# Patient Record
Sex: Female | Born: 1935
Health system: Southern US, Community
[De-identification: ages and names within clinical notes are randomized; demographics above are authoritative.]

## PROBLEM LIST (undated history)

## (undated) DIAGNOSIS — I10 Essential (primary) hypertension: Secondary | ICD-10-CM

## (undated) DIAGNOSIS — I219 Acute myocardial infarction, unspecified: Secondary | ICD-10-CM

## (undated) DIAGNOSIS — H269 Unspecified cataract: Secondary | ICD-10-CM

## (undated) DIAGNOSIS — I2510 Atherosclerotic heart disease of native coronary artery without angina pectoris: Secondary | ICD-10-CM

## (undated) DIAGNOSIS — K219 Gastro-esophageal reflux disease without esophagitis: Secondary | ICD-10-CM

## (undated) DIAGNOSIS — G709 Myoneural disorder, unspecified: Secondary | ICD-10-CM

## (undated) DIAGNOSIS — T7840XA Allergy, unspecified, initial encounter: Secondary | ICD-10-CM

## (undated) DIAGNOSIS — E039 Hypothyroidism, unspecified: Secondary | ICD-10-CM

## (undated) DIAGNOSIS — E785 Hyperlipidemia, unspecified: Secondary | ICD-10-CM

## (undated) DIAGNOSIS — M159 Polyosteoarthritis, unspecified: Secondary | ICD-10-CM

## (undated) HISTORY — DX: Gastro-esophageal reflux disease without esophagitis: K21.9

## (undated) HISTORY — DX: Polyosteoarthritis, unspecified: M15.9

## (undated) HISTORY — DX: Myoneural disorder, unspecified: G70.9

## (undated) HISTORY — PX: ABDOMINAL HYSTERECTOMY: SHX81

## (undated) HISTORY — DX: Hyperlipidemia, unspecified: E78.5

## (undated) HISTORY — DX: Essential (primary) hypertension: I10

## (undated) HISTORY — PX: TUBAL LIGATION: SHX77

## (undated) HISTORY — PX: APPENDECTOMY: SHX54

## (undated) HISTORY — PX: BLADDER REPAIR: SHX76

## (undated) HISTORY — DX: Hypothyroidism, unspecified: E03.9

## (undated) HISTORY — PX: SPINE SURGERY: SHX786

## (undated) HISTORY — DX: Unspecified cataract: H26.9

## (undated) HISTORY — DX: Allergy, unspecified, initial encounter: T78.40XA

## (undated) SURGERY — Surgical Case
Anesthesia: *Unknown

## (undated) SURGICAL SUPPLY — 20 items
BALLN EMERGE MR 2.5X8 (BALLOONS) ×1 IMPLANT
BALLN WOLVERINE 2.50X6 (BALLOONS) ×1 IMPLANT
BALLN ~~LOC~~ EMERGE MR 2.5X6 (BALLOONS) ×1 IMPLANT
BALLOON EMERGE MR 2.5X8 (BALLOONS) IMPLANT
BALLOON WOLVERINE 2.50X6 (BALLOONS) IMPLANT
BALLOON ~~LOC~~ EMERGE MR 2.5X6 (BALLOONS) IMPLANT
CATH DRAGONFLY OPSTAR (CATHETERS) IMPLANT
CATH INFINITI AMBI 5FR TG (CATHETERS) IMPLANT
CATH VISTA GUIDE 6FR XB3.5 EPK (CATHETERS) IMPLANT
DEVICE RAD COMP TR BAND LRG (VASCULAR PRODUCTS) IMPLANT
GLIDESHEATH SLEND A-KIT 6F 22G (SHEATH) IMPLANT
GUIDEWIRE INQWIRE 1.5J.035X260 (WIRE) IMPLANT
INQWIRE 1.5J .035X260CM (WIRE) ×1 IMPLANT
KIT ENCORE 26 ADVANTAGE (KITS) IMPLANT
KIT HEMO VALVE WATCHDOG (MISCELLANEOUS) IMPLANT
PACK CARDIAC CATHETERIZATION (CUSTOM PROCEDURE TRAY) ×1 IMPLANT
SET ATX-X65L (MISCELLANEOUS) IMPLANT
STENT ONYX FRONTIER 2.5X08 (Permanent Stent) IMPLANT
WIRE ASAHI PROWATER 180CM (WIRE) IMPLANT
WIRE RUNTHROUGH .014X180CM (WIRE) IMPLANT

---

## 1997-01-31 HISTORY — PX: BACK SURGERY: SHX140

## 1999-11-03 ENCOUNTER — Other Ambulatory Visit: Admission: RE | Admit: 1999-11-03 | Discharge: 1999-11-03 | Payer: Self-pay | Admitting: Internal Medicine

## 2001-08-21 ENCOUNTER — Ambulatory Visit (HOSPITAL_COMMUNITY): Admission: RE | Admit: 2001-08-21 | Discharge: 2001-08-21 | Payer: Self-pay | Admitting: Gastroenterology

## 2003-03-05 ENCOUNTER — Encounter: Admission: RE | Admit: 2003-03-05 | Discharge: 2003-03-05 | Payer: Self-pay | Admitting: Neurosurgery

## 2004-01-20 ENCOUNTER — Ambulatory Visit: Payer: Self-pay | Admitting: Internal Medicine

## 2004-03-16 ENCOUNTER — Ambulatory Visit: Payer: Self-pay | Admitting: Internal Medicine

## 2004-04-21 ENCOUNTER — Ambulatory Visit: Payer: Self-pay | Admitting: Internal Medicine

## 2004-07-22 ENCOUNTER — Ambulatory Visit: Payer: Self-pay | Admitting: Internal Medicine

## 2004-07-22 ENCOUNTER — Other Ambulatory Visit: Admission: RE | Admit: 2004-07-22 | Discharge: 2004-07-22 | Payer: Self-pay | Admitting: Internal Medicine

## 2004-11-22 ENCOUNTER — Ambulatory Visit: Payer: Self-pay | Admitting: Internal Medicine

## 2005-01-12 ENCOUNTER — Inpatient Hospital Stay (HOSPITAL_COMMUNITY): Admission: RE | Admit: 2005-01-12 | Discharge: 2005-01-13 | Payer: Self-pay | Admitting: Obstetrics and Gynecology

## 2005-01-12 ENCOUNTER — Encounter (INDEPENDENT_AMBULATORY_CARE_PROVIDER_SITE_OTHER): Payer: Self-pay | Admitting: Specialist

## 2005-03-21 ENCOUNTER — Ambulatory Visit: Payer: Self-pay | Admitting: Internal Medicine

## 2005-03-24 ENCOUNTER — Ambulatory Visit: Payer: Self-pay

## 2005-05-19 ENCOUNTER — Ambulatory Visit: Payer: Self-pay | Admitting: Internal Medicine

## 2005-05-26 ENCOUNTER — Ambulatory Visit: Payer: Self-pay | Admitting: Internal Medicine

## 2005-07-01 ENCOUNTER — Ambulatory Visit: Payer: Self-pay | Admitting: Internal Medicine

## 2005-08-30 ENCOUNTER — Ambulatory Visit: Payer: Self-pay | Admitting: Internal Medicine

## 2005-09-06 ENCOUNTER — Ambulatory Visit: Payer: Self-pay | Admitting: Internal Medicine

## 2005-12-06 ENCOUNTER — Ambulatory Visit: Payer: Self-pay | Admitting: Internal Medicine

## 2005-12-06 LAB — CONVERTED CEMR LAB
ALT: 13 units/L (ref 0–40)
AST: 22 units/L (ref 0–37)
Albumin: 3.5 g/dL (ref 3.5–5.2)
Alkaline Phosphatase: 84 units/L (ref 39–117)
VLDL: 21 mg/dL (ref 0–40)

## 2006-02-23 ENCOUNTER — Ambulatory Visit: Payer: Self-pay | Admitting: Internal Medicine

## 2006-03-07 ENCOUNTER — Ambulatory Visit: Payer: Self-pay | Admitting: Internal Medicine

## 2006-03-29 ENCOUNTER — Ambulatory Visit: Payer: Self-pay | Admitting: Internal Medicine

## 2006-05-08 ENCOUNTER — Ambulatory Visit: Payer: Self-pay | Admitting: Internal Medicine

## 2006-05-15 ENCOUNTER — Ambulatory Visit: Payer: Self-pay | Admitting: Internal Medicine

## 2006-07-03 ENCOUNTER — Ambulatory Visit: Payer: Self-pay | Admitting: Internal Medicine

## 2006-07-03 LAB — CONVERTED CEMR LAB
ALT: 18 units/L (ref 0–40)
AST: 23 units/L (ref 0–37)
Alkaline Phosphatase: 72 units/L (ref 39–117)
BUN: 18 mg/dL (ref 6–23)
Basophils Relative: 0.8 % (ref 0.0–1.0)
Bilirubin, Direct: 0.1 mg/dL (ref 0.0–0.3)
CO2: 32 meq/L (ref 19–32)
Calcium: 9.4 mg/dL (ref 8.4–10.5)
Chloride: 107 meq/L (ref 96–112)
Eosinophils Absolute: 0.2 10*3/uL (ref 0.0–0.6)
Eosinophils Relative: 2.2 % (ref 0.0–5.0)
GFR calc non Af Amer: 75 mL/min
Glucose, Bld: 102 mg/dL — ABNORMAL HIGH (ref 70–99)
Monocytes Relative: 8.2 % (ref 3.0–11.0)
Platelets: 218 10*3/uL (ref 150–400)
RBC: 4.3 M/uL (ref 3.87–5.11)
Total CHOL/HDL Ratio: 4
Triglycerides: 146 mg/dL (ref 0–149)
VLDL: 29 mg/dL (ref 0–40)
WBC: 7.4 10*3/uL (ref 4.5–10.5)

## 2006-08-24 DIAGNOSIS — K219 Gastro-esophageal reflux disease without esophagitis: Secondary | ICD-10-CM | POA: Insufficient documentation

## 2006-08-24 DIAGNOSIS — I10 Essential (primary) hypertension: Secondary | ICD-10-CM | POA: Insufficient documentation

## 2006-08-24 DIAGNOSIS — E039 Hypothyroidism, unspecified: Secondary | ICD-10-CM | POA: Insufficient documentation

## 2006-08-24 DIAGNOSIS — M810 Age-related osteoporosis without current pathological fracture: Secondary | ICD-10-CM | POA: Insufficient documentation

## 2006-09-06 ENCOUNTER — Ambulatory Visit: Payer: Self-pay | Admitting: Internal Medicine

## 2006-09-06 DIAGNOSIS — E7800 Pure hypercholesterolemia, unspecified: Secondary | ICD-10-CM | POA: Insufficient documentation

## 2006-09-06 DIAGNOSIS — E785 Hyperlipidemia, unspecified: Secondary | ICD-10-CM | POA: Insufficient documentation

## 2006-12-17 ENCOUNTER — Emergency Department (HOSPITAL_COMMUNITY): Admission: EM | Admit: 2006-12-17 | Discharge: 2006-12-17 | Payer: Self-pay | Admitting: Emergency Medicine

## 2006-12-19 ENCOUNTER — Telehealth: Payer: Self-pay | Admitting: Internal Medicine

## 2006-12-22 ENCOUNTER — Telehealth (INDEPENDENT_AMBULATORY_CARE_PROVIDER_SITE_OTHER): Payer: Self-pay | Admitting: *Deleted

## 2006-12-22 ENCOUNTER — Ambulatory Visit: Payer: Self-pay | Admitting: Internal Medicine

## 2007-03-09 ENCOUNTER — Ambulatory Visit: Payer: Self-pay | Admitting: Internal Medicine

## 2007-03-09 LAB — CONVERTED CEMR LAB
Albumin: 3.6 g/dL (ref 3.5–5.2)
Alkaline Phosphatase: 79 units/L (ref 39–117)
Direct LDL: 169.5 mg/dL
HDL: 38.6 mg/dL — ABNORMAL LOW (ref >39.0)
TSH: 0.56 microintl units/mL (ref 0.35–5.50)
Total Bilirubin: 0.8 mg/dL (ref 0.3–1.2)
Triglycerides: 330 mg/dL (ref 0–149)
VLDL: 66 mg/dL — ABNORMAL HIGH (ref 0–40)

## 2007-03-15 ENCOUNTER — Ambulatory Visit: Payer: Self-pay | Admitting: Internal Medicine

## 2007-03-15 LAB — CONVERTED CEMR LAB
Cholesterol, target level: 200 mg/dL
HDL goal, serum: 40 mg/dL
LDL Goal: 130 mg/dL

## 2007-06-01 ENCOUNTER — Ambulatory Visit: Payer: Self-pay | Admitting: Internal Medicine

## 2007-07-20 ENCOUNTER — Ambulatory Visit: Payer: Self-pay | Admitting: Internal Medicine

## 2007-07-20 LAB — CONVERTED CEMR LAB
ALT: 19 units/L (ref 0–35)
AST: 24 units/L (ref 0–37)
Alkaline Phosphatase: 70 units/L (ref 39–117)
Bilirubin, Direct: 0.2 mg/dL (ref 0.0–0.3)
Cholesterol: 182 mg/dL (ref 0–200)
Total Bilirubin: 0.9 mg/dL (ref 0.3–1.2)
Total Protein: 6.7 g/dL (ref 6.0–8.3)

## 2007-08-01 ENCOUNTER — Telehealth: Payer: Self-pay | Admitting: Internal Medicine

## 2007-08-01 ENCOUNTER — Ambulatory Visit: Payer: Self-pay | Admitting: Internal Medicine

## 2007-08-01 DIAGNOSIS — I8000 Phlebitis and thrombophlebitis of superficial vessels of unspecified lower extremity: Secondary | ICD-10-CM | POA: Insufficient documentation

## 2007-08-06 ENCOUNTER — Telehealth: Payer: Self-pay | Admitting: Internal Medicine

## 2007-08-15 ENCOUNTER — Telehealth: Payer: Self-pay | Admitting: Internal Medicine

## 2007-08-29 ENCOUNTER — Ambulatory Visit: Payer: Self-pay | Admitting: Family Medicine

## 2007-08-29 DIAGNOSIS — J209 Acute bronchitis, unspecified: Secondary | ICD-10-CM | POA: Insufficient documentation

## 2007-11-01 ENCOUNTER — Ambulatory Visit: Payer: Self-pay | Admitting: Internal Medicine

## 2007-11-01 DIAGNOSIS — J302 Other seasonal allergic rhinitis: Secondary | ICD-10-CM | POA: Insufficient documentation

## 2007-11-20 ENCOUNTER — Encounter: Admission: RE | Admit: 2007-11-20 | Discharge: 2007-11-20 | Payer: Self-pay | Admitting: Internal Medicine

## 2007-11-20 ENCOUNTER — Encounter: Payer: Self-pay | Admitting: Internal Medicine

## 2008-03-12 ENCOUNTER — Ambulatory Visit: Payer: Self-pay | Admitting: Internal Medicine

## 2008-03-12 LAB — CONVERTED CEMR LAB
ALT: 20 units/L (ref 0–35)
Albumin: 3.8 g/dL (ref 3.5–5.2)
Alkaline Phosphatase: 81 units/L (ref 39–117)
BUN: 14 mg/dL (ref 6–23)
Bilirubin, Direct: 0.1 mg/dL (ref 0.0–0.3)
CO2: 29 meq/L (ref 19–32)
Calcium: 9.4 mg/dL (ref 8.4–10.5)
Cholesterol: 203 mg/dL (ref 0–200)
Eosinophils Relative: 2.5 % (ref 0.0–5.0)
Glucose, Bld: 104 mg/dL — ABNORMAL HIGH (ref 70–99)
HCT: 39.1 % (ref 36.0–46.0)
Hemoglobin: 13.6 g/dL (ref 12.0–15.0)
Lymphocytes Relative: 24.8 % (ref 12.0–46.0)
Monocytes Absolute: 0.6 10*3/uL (ref 0.1–1.0)
Monocytes Relative: 8.8 % (ref 3.0–12.0)
Neutro Abs: 4 10*3/uL (ref 1.4–7.7)
Potassium: 3.4 meq/L — ABNORMAL LOW (ref 3.5–5.1)
RDW: 11.8 % (ref 11.5–14.6)
Sodium: 141 meq/L (ref 135–145)
Total CHOL/HDL Ratio: 4.1
Total Protein: 7.3 g/dL (ref 6.0–8.3)
VLDL: 25 mg/dL (ref 0–40)
WBC: 6.5 10*3/uL (ref 4.5–10.5)

## 2008-06-05 ENCOUNTER — Emergency Department (HOSPITAL_COMMUNITY): Admission: EM | Admit: 2008-06-05 | Discharge: 2008-06-05 | Payer: Self-pay | Admitting: Emergency Medicine

## 2008-06-05 ENCOUNTER — Telehealth (INDEPENDENT_AMBULATORY_CARE_PROVIDER_SITE_OTHER): Payer: Self-pay | Admitting: *Deleted

## 2008-07-30 ENCOUNTER — Ambulatory Visit: Payer: Self-pay | Admitting: Internal Medicine

## 2008-07-30 DIAGNOSIS — C44599 Other specified malignant neoplasm of skin of other part of trunk: Secondary | ICD-10-CM | POA: Insufficient documentation

## 2008-07-30 DIAGNOSIS — J019 Acute sinusitis, unspecified: Secondary | ICD-10-CM | POA: Insufficient documentation

## 2008-07-30 DIAGNOSIS — J0111 Acute recurrent frontal sinusitis: Secondary | ICD-10-CM | POA: Insufficient documentation

## 2008-07-30 DIAGNOSIS — J0110 Acute frontal sinusitis, unspecified: Secondary | ICD-10-CM | POA: Insufficient documentation

## 2008-08-27 ENCOUNTER — Ambulatory Visit: Payer: Self-pay | Admitting: Internal Medicine

## 2008-08-27 ENCOUNTER — Telehealth: Payer: Self-pay | Admitting: Internal Medicine

## 2008-08-27 DIAGNOSIS — R05 Cough: Secondary | ICD-10-CM

## 2008-08-27 DIAGNOSIS — R059 Cough, unspecified: Secondary | ICD-10-CM | POA: Insufficient documentation

## 2008-08-27 DIAGNOSIS — R053 Chronic cough: Secondary | ICD-10-CM | POA: Insufficient documentation

## 2008-09-12 ENCOUNTER — Encounter: Payer: Self-pay | Admitting: Internal Medicine

## 2008-09-12 ENCOUNTER — Ambulatory Visit: Payer: Self-pay

## 2008-09-22 ENCOUNTER — Ambulatory Visit: Payer: Self-pay | Admitting: Internal Medicine

## 2008-09-22 DIAGNOSIS — B079 Viral wart, unspecified: Secondary | ICD-10-CM | POA: Insufficient documentation

## 2008-09-22 DIAGNOSIS — D1739 Benign lipomatous neoplasm of skin and subcutaneous tissue of other sites: Secondary | ICD-10-CM | POA: Insufficient documentation

## 2008-09-22 DIAGNOSIS — D239 Other benign neoplasm of skin, unspecified: Secondary | ICD-10-CM | POA: Insufficient documentation

## 2008-09-22 DIAGNOSIS — L989 Disorder of the skin and subcutaneous tissue, unspecified: Secondary | ICD-10-CM | POA: Insufficient documentation

## 2008-09-22 DIAGNOSIS — R233 Spontaneous ecchymoses: Secondary | ICD-10-CM | POA: Insufficient documentation

## 2009-04-21 ENCOUNTER — Telehealth: Payer: Self-pay | Admitting: Internal Medicine

## 2009-05-29 ENCOUNTER — Ambulatory Visit: Payer: Self-pay | Admitting: Internal Medicine

## 2009-05-29 LAB — CONVERTED CEMR LAB
Ketones, urine, test strip: NEGATIVE
Nitrite: NEGATIVE
Urobilinogen, UA: 0.2
WBC Urine, dipstick: NEGATIVE

## 2009-06-01 LAB — CONVERTED CEMR LAB
AST: 23 units/L (ref 0–37)
Albumin: 3.7 g/dL (ref 3.5–5.2)
Alkaline Phosphatase: 73 units/L (ref 39–117)
Basophils Absolute: 0 10*3/uL (ref 0.0–0.1)
Calcium: 9.4 mg/dL (ref 8.4–10.5)
Eosinophils Relative: 2.9 % (ref 0.0–5.0)
GFR calc non Af Amer: 74.66 mL/min (ref >60)
Glucose, Bld: 95 mg/dL (ref 70–99)
HDL: 45.3 mg/dL (ref >39.00)
Hemoglobin: 13.4 g/dL (ref 12.0–15.0)
Lymphocytes Relative: 30.2 % (ref 12.0–46.0)
Monocytes Relative: 10.5 % (ref 3.0–12.0)
Neutro Abs: 2.8 10*3/uL (ref 1.4–7.7)
RBC: 4.22 M/uL (ref 3.87–5.11)
RDW: 13.8 % (ref 11.5–14.6)
Sodium: 143 meq/L (ref 135–145)
Total CHOL/HDL Ratio: 6
Triglycerides: 287 mg/dL — ABNORMAL HIGH (ref 0.0–149.0)
VLDL: 57.4 mg/dL — ABNORMAL HIGH (ref 0.0–40.0)
WBC: 4.9 10*3/uL (ref 4.5–10.5)

## 2009-06-05 ENCOUNTER — Ambulatory Visit: Payer: Self-pay | Admitting: Internal Medicine

## 2009-06-05 DIAGNOSIS — R7303 Prediabetes: Secondary | ICD-10-CM | POA: Insufficient documentation

## 2009-12-04 ENCOUNTER — Ambulatory Visit: Payer: Self-pay | Admitting: Internal Medicine

## 2009-12-04 LAB — CONVERTED CEMR LAB
Calcium: 9.8 mg/dL (ref 8.4–10.5)
Chloride: 104 meq/L (ref 96–112)
Cholesterol: 219 mg/dL — ABNORMAL HIGH (ref 0–200)
Creatinine, Ser: 0.9 mg/dL (ref 0.4–1.2)
Direct LDL: 148 mg/dL
Total CHOL/HDL Ratio: 4
Triglycerides: 211 mg/dL — ABNORMAL HIGH (ref 0.0–149.0)
VLDL: 42.2 mg/dL — ABNORMAL HIGH (ref 0.0–40.0)
Vit D, 25-Hydroxy: 59 ng/mL (ref 30–89)

## 2010-02-05 ENCOUNTER — Ambulatory Visit
Admission: RE | Admit: 2010-02-05 | Discharge: 2010-02-05 | Payer: Self-pay | Source: Home / Self Care | Attending: Internal Medicine | Admitting: Internal Medicine

## 2010-02-05 DIAGNOSIS — R0989 Other specified symptoms and signs involving the circulatory and respiratory systems: Secondary | ICD-10-CM | POA: Insufficient documentation

## 2010-02-05 DIAGNOSIS — M5412 Radiculopathy, cervical region: Secondary | ICD-10-CM | POA: Insufficient documentation

## 2010-02-11 ENCOUNTER — Encounter: Payer: Self-pay | Admitting: Internal Medicine

## 2010-02-12 ENCOUNTER — Ambulatory Visit: Admission: RE | Admit: 2010-02-12 | Discharge: 2010-02-12 | Payer: Self-pay | Source: Home / Self Care

## 2010-02-12 ENCOUNTER — Encounter: Payer: Self-pay | Admitting: Internal Medicine

## 2010-03-02 ENCOUNTER — Encounter: Admission: RE | Admit: 2010-03-02 | Payer: Self-pay | Source: Home / Self Care | Admitting: Internal Medicine

## 2010-03-04 NOTE — Assessment & Plan Note (Signed)
Summary: CPX  // RS   Vital Signs:  Patient profile:   77 year old female Height:      67 inches Weight:      168 pounds BMI:     26.41 Temp:     98.2 degrees F oral Pulse rate:   68 / minute Resp:     14 per minute BP sitting:   140 / 80  (left arm)  Vitals Entered By: Willy Eddy, LPN (Jun 05, 6293 11:00 AM) CC: annual visit for disease management-- not taking vytorin, Hypertension Management, Lipid Management   CC:  annual visit for disease management-- not taking vytorin, Hypertension Management, and Lipid Management.  History of Present Illness: The pt was asked about all immunizations, health maint. services that are appropriate to their age and was given guidance on diet exercize  and weight management  lipids: not tsaking meds and this is apaent from the lipid panel  the pt had her mamaogram and pelvic immunizations and referral reviewed and she will need to get flu shot   Hypertension History:      She denies headache, chest pain, palpitations, dyspnea with exertion, orthopnea, PND, peripheral edema, visual symptoms, neurologic problems, syncope, and side effects from treatment.        Positive major cardiovascular risk factors include female age 16 years old or older, hyperlipidemia, and hypertension.  Negative major cardiovascular risk factors include negative family history for ischemic heart disease and non-tobacco-user status.    Lipid Management History:      Positive NCEP/ATP III risk factors include female age 55 years old or older and hypertension.  Negative NCEP/ATP III risk factors include no family history for ischemic heart disease and non-tobacco-user status.      Preventive Screening-Counseling & Management  Alcohol-Tobacco     Smoking Status: never     Passive Smoke Exposure: no  Current Problems (verified): 1)  Benign Neoplasm of Skin Site Unspecified  (ICD-216.9) 2)  Wart, Viral  (ICD-078.10) 3)  Lipoma, Skin  (ICD-214.1) 4)   Cardiomegaly  (ICD-429.3) 5)  Cough  (ICD-786.2) 6)  Neoplasm, Malignant, Skin, Trunk  (ICD-173.5) 7)  Acute Sinusitis, Unspecified  (ICD-461.9) 8)  Preventive Health Care  (ICD-V70.0) 9)  Allergic Rhinitis  (ICD-477.9) 10)  Acute Bronchitis  (ICD-466.0) 11)  Phlebitis&thrombophleb Sup Vessels Lower Extrem  (ICD-451.0) 12)  Hyperlipidemia Nec/nos  (ICD-272.4) 13)  Hypothyroidism  (ICD-244.9) 14)  Hypertension  (ICD-401.9) 15)  Gerd  (ICD-530.81) 16)  Family History Diabetes 1st Degree Relative  (ICD-V18.0) 17)  Osteoporosis  (ICD-733.00)  Medications Prior to Update: 1)  Folic Acid 1 Mg  Tabs (Folic Acid) .... Once Daily 2)  Oscal 500/200 D-3 500-200 Mg-Unit  Tabs (Calcium-Vitamin D) .... Once Daily 3)  Synthroid 125 Mcg  Tabs (Levothyroxine Sodium) .... Once Daily 4)  Bl Vitamin E Natural 400 Unit  Caps (Vitamin E) .... Once Daily 5)  Maxzide-25 37.5-25 Mg  Tabs (Triamterene-Hctz) .... Once Daily 6)  Vytorin 10-40 Mg  Tabs (Ezetimibe-Simvastatin) .... Once Daily 7)  Bayer Low Strength 81 Mg  Tbec (Aspirin) .... Once Daily 8)  Nexium 40 Mg  Cpdr (Esomeprazole Magnesium) .... One By Mouth Daily 9)  D 1000 Plus  Tabs (Fa-Cyanocobalamin-B6-D-Ca) .... 2 Once Daily 10)  Zyrtec Allergy 10 Mg Tabs (Cetirizine Hcl) .Marland Kitchen.. 1 Once Daily 11)  Advil Cold/sinus 30-200 Mg Tabs (Pseudoephedrine-Ibuprofen) .... As Needed 12)  Biaxin 500 Mg Tabs (Clarithromycin) .Marland Kitchen.. 1 Two Times A Day For 10 Days  13)  Hydromet 5-1.5 Mg/74ml Syrp (Hydrocodone-Homatropine) .Marland Kitchen.. 1 Tsp Every 6 Hours Prn 14)  Keflex 500 Mg Caps (Cephalexin) .... One By Mouth Tid  Current Medications (verified): 1)  Folic Acid 1 Mg  Tabs (Folic Acid) .... Once Daily 2)  Oscal 500/200 D-3 500-200 Mg-Unit  Tabs (Calcium-Vitamin D) .... Once Daily 3)  Synthroid 125 Mcg  Tabs (Levothyroxine Sodium) .... Once Daily 4)  Bl Vitamin E Natural 400 Unit  Caps (Vitamin E) .... Once Daily 5)  Maxzide-25 37.5-25 Mg  Tabs (Triamterene-Hctz) .... Once  Daily 6)  Vytorin 10-40 Mg  Tabs (Ezetimibe-Simvastatin) .... Once Daily 7)  Bayer Low Strength 81 Mg  Tbec (Aspirin) .... Once Daily 8)  Nexium 40 Mg  Cpdr (Esomeprazole Magnesium) .... One By Mouth Daily 9)  D 1000 Plus  Tabs (Fa-Cyanocobalamin-B6-D-Ca) .... 2 Once Daily 10)  Zyrtec Allergy 10 Mg Tabs (Cetirizine Hcl) .Marland Kitchen.. 1 Once Daily 11)  Advil Cold/sinus 30-200 Mg Tabs (Pseudoephedrine-Ibuprofen) .... As Needed  Allergies: No Known Drug Allergies  Past History:  Family History: Last updated: 08/24/2006 Family History Diabetes 1st degree relative Family History Hypertension Family History of Stroke M 1st degree relative <50 Fam hc CHF Fam hx MI  Social History: Last updated: 08/24/2006 Never Smoked Alcohol use-yes  Risk Factors: Smoking Status: never (06/05/2009) Passive Smoke Exposure: no (06/05/2009)  Past medical, surgical, family and social histories (including risk factors) reviewed, and no changes noted (except as noted below).  Past Medical History: Reviewed history from 11/01/2007 and no changes required. Osteoporosis Lipids GERD Hypertension Hypothyroidism Allergic rhinitis  Past Surgical History: Reviewed history from 08/24/2006 and no changes required. TAH-25 yrs  Family History: Reviewed history from 08/24/2006 and no changes required. Family History Diabetes 1st degree relative Family History Hypertension Family History of Stroke M 1st degree relative <50 Fam hc CHF Fam hx MI  Social History: Reviewed history from 08/24/2006 and no changes required. Never Smoked Alcohol use-yes  Review of Systems  The patient denies anorexia, fever, weight loss, weight gain, vision loss, decreased hearing, hoarseness, chest pain, syncope, dyspnea on exertion, peripheral edema, prolonged cough, headaches, hemoptysis, abdominal pain, melena, hematochezia, severe indigestion/heartburn, hematuria, incontinence, genital sores, muscle weakness, suspicious skin  lesions, transient blindness, difficulty walking, depression, unusual weight change, abnormal bleeding, enlarged lymph nodes, angioedema, and breast masses.    Physical Exam  General:  Coughing frequently Head:  Normocephalic and atraumatic without obvious abnormalities. No apparent alopecia or balding. Eyes:  No corneal or conjunctival inflammation noted. EOMI. Perrla. Funduscopic exam benign, without hemorrhages, exudates or papilledema. Vision grossly normal. Ears:  R ear normal and L ear normal.   Nose:  no external deformity and no nasal discharge.   Neck:  No deformities, masses, or tenderness noted. Lungs:  normal respiratory effort and no wheezes.   Heart:  normal rate and regular rhythm.   Abdomen:  soft and non-tender.   Msk:  normal ROM, no joint tenderness, and no joint swelling.   Extremities:  No clubbing, cyanosis, edema, or deformity noted with normal full range of motion of all joints.   Neurologic:  No cranial nerve deficits noted. Station and gait are normal. Plantar reflexes are down-going bilaterally. DTRs are symmetrical throughout. Sensory, motor and coordinative functions appear intact.   Impression & Recommendations:  Problem # 1:  DYSMETABOLIC SYNDROME (ICD-277.7) Assessment New the risk of DM is very high due to the weight the HTN and trigycerides  Problem # 2:  HYPERLIPIDEMIA NEC/NOS (ICD-272.4) adherance taught Her  updated medication list for this problem includes:    Vytorin 10-40 Mg Tabs (Ezetimibe-simvastatin) ..... Once daily  Labs Reviewed: SGOT: 23 (05/29/2009)   SGPT: 20 (05/29/2009)  Lipid Goals: Chol Goal: 200 (03/15/2007)   HDL Goal: 40 (03/15/2007)   LDL Goal: 130 (03/15/2007)   TG Goal: 150 (03/15/2007)  10 Yr Risk Heart Disease: Not enough information Prior 10 Yr Risk Heart Disease: 11 % (03/15/2007)   HDL:45.30 (05/29/2009), 49.5 (03/12/2008)  LDL:DEL (03/12/2008), 125 (07/20/2007)  Chol:290 (05/29/2009), 203 (03/12/2008)  Trig:287.0  (05/29/2009), 126 (03/12/2008)  Problem # 3:  PREVENTIVE HEALTH CARE (ICD-V70.0) Assessment: Unchanged The pt was asked about all immunizations, health maint. services that are appropriate to their age and was given guidance on diet exercize  and weight management  Mammogram: normal (05/01/2009) Pap smear: normal (05/01/2009) Colonoscopy: repeat in 10 years  (08/21/2001) Bone Density: osteoporosis (03/19/2004) Td Booster: Historical (01/31/2006)   Flu Vax: Fluvax 3+ (11/01/2007)   Pneumovax: Historical (01/31/2006) Chol: 290 (05/29/2009)   HDL: 45.30 (05/29/2009)   LDL: DEL (03/12/2008)   TG: 287.0 (05/29/2009) TSH: 0.21 (05/29/2009)   Next mammogram due:: 05/2010 (06/05/2009) Next Colonoscopy due:: 09/2011 (03/12/2008)  Discussed using sunscreen, use of alcohol, drug use, self breast exam, routine dental care, routine eye care, schedule for GYN exam, routine physical exam, seat belts, multiple vitamins, osteoporosis prevention, adequate calcium intake in diet, recommendations for immunizations, mammograms and Pap smears.  Discussed exercise and checking cholesterol.  Discussed gun safety, safe sex, and contraception.  Complete Medication List: 1)  Folic Acid 1 Mg Tabs (Folic acid) .... Once daily 2)  Oscal 500/200 D-3 500-200 Mg-unit Tabs (Calcium-vitamin d) .... Once daily 3)  Synthroid 125 Mcg Tabs (Levothyroxine sodium) .... Once daily 4)  Maxzide-25 37.5-25 Mg Tabs (Triamterene-hctz) .... Once daily 5)  Vytorin 10-40 Mg Tabs (Ezetimibe-simvastatin) .... Once daily 6)  Bayer Low Strength 81 Mg Tbec (Aspirin) .... Once daily 7)  Nexium 40 Mg Cpdr (Esomeprazole magnesium) .... One by mouth daily 8)  Vitamin D3 2000 Unit Caps (Cholecalciferol) .... One by mouth daily 9)  Zyrtec Allergy 10 Mg Tabs (Cetirizine hcl) .Marland Kitchen.. 1 once daily 10)  Advil Cold/sinus 30-200 Mg Tabs (Pseudoephedrine-ibuprofen) .... As needed  Other Orders: EKG w/ Interpretation (93000)  Hypertension  Assessment/Plan:      The patient's hypertensive risk group is category B: At least one risk factor (excluding diabetes) with no target organ damage.  Today's blood pressure is 140/80.  Her blood pressure goal is < 140/90.  Lipid Assessment/Plan:      Based on NCEP/ATP III, the patient's risk factor category is "0-1 risk factors".  The patient's lipid goals are as follows: Total cholesterol goal is 200; LDL cholesterol goal is 130; HDL cholesterol goal is 40; Triglyceride goal is 150.    Patient Instructions: 1)  Please schedule a follow-up appointment in 6 months. 2)  kril oil 1000mg  two times a day 3)  cherry juice or cherry extract from health food store 4)  tumeric is also good   Preventive Care Screening  Mammogram:    Date:  05/01/2009    Next Due:  05/2010    Results:  normal   Pap Smear:    Date:  05/01/2009    Next Due:  05/2010    Results:  normal     Contraindications/Deferment of Procedures/Staging:    Test/Procedure: FLU VAX    Reason for deferment: patient declined

## 2010-03-04 NOTE — Miscellaneous (Signed)
Summary: Orders Update  Clinical Lists Changes  Orders: Added new Test order of Carotid Duplex (Carotid Duplex) - Signed 

## 2010-03-04 NOTE — Progress Notes (Signed)
Summary: REQ FOR REFILLS  Phone Note Call from Patient   Caller: Patient  [phone] Reason for Call: Refill Medication Summary of Call: Pt called to req a refill on meds: Folic Acid 1 Mg Tabs,  Synthroid 125 Mcg Tabs,  Maxzide-25 37.5-25 Mg,  and   Nexium 40 Mg..... Pt adv that she has the Rx sent to [facility].  Pt can be reached at [phone] with any questions or concerns.  Initial call taken by: Debbra Riding,  April 21, 2009 1:39 PM    Prescriptions: MAXZIDE-25 37.5-25 MG  TABS (TRIAMTERENE-HCTZ) once daily  #90 x 3   Entered by:   Willy Eddy, LPN   Authorized by:   Stacie Glaze MD   Signed by:   Willy Eddy, LPN on 29/56/2130   Method used:   Faxed to ...       [facility] (mail-order)             , [place]         Ph: [phone]       Fax: [phone]   RxID:   [id number] NEXIUM 40 MG  CPDR (ESOMEPRAZOLE MAGNESIUM) one by mouth daily  #90 x 3   Entered by:   Willy Eddy, LPN   Authorized by:   Stacie Glaze MD   Signed by:   Willy Eddy, LPN on 42/59/5638   Method used:   Faxed to ...       [facility] (mail-order)             , [place]         Ph: [phone]       Fax: [phone]   RxID:   [id number] SYNTHROID 125 MCG  TABS (LEVOTHYROXINE SODIUM) once daily  #90 x 3   Entered by:   Willy Eddy, LPN   Authorized by:   Stacie Glaze MD   Signed by:   Willy Eddy, LPN on 25/42/7062   Method used:   Faxed to ...       [facility] (mail-order)             , [place]         Ph: [phone]       Fax: [phone]   RxID:   [id number] FOLIC ACID 1 MG  TABS (FOLIC ACID) once daily  #90 x 3   Entered by:   Willy Eddy, LPN   Authorized by:   Stacie Glaze MD   Signed by:   Willy Eddy, LPN on 38/18/2993   Method used:   Faxed to ...       [facility] (mail-order)             , [place]         Ph: [phone]       Fax: [phone]   RxID:    [id number]

## 2010-03-04 NOTE — Assessment & Plan Note (Signed)
Summary: 2 month rov/njr   Vital Signs:  Patient profile:   77 year old female Height:      67 inches Weight:      168 pounds BMI:     26.41 Temp:     98.2 degrees F oral Pulse rate:   72 / minute Resp:     14 per minute BP sitting:   138 / 80  (left arm)  Vitals Entered By: Willy Eddy, LPN (February 05, 2010 10:10 AM) CC: roa, Lipid Management, Neck pain Is Patient Diabetic? No   Primary Care Provider:  Stacie Glaze MD  CC:  roa, Lipid Management, and Neck pain.  History of Present Illness: follow up for cholesterol  Hyperlipidemia Follow-Up      This is a 77 year old woman who presents for Hyperlipidemia follow-up.  The patient denies muscle aches, GI upset, abdominal pain, flushing, itching, constipation, diarrhea, and fatigue.  The patient denies the following symptoms: chest pain/pressure, exercise intolerance, dypsnea, palpitations, syncope, and pedal edema.  Compliance with medications (by patient report) has been near 100%.  Dietary compliance has been good.  The patient reports exercising 3-4X per week.  Adjunctive measures currently used by the patient include weight reduction.    Neck Pain      The patient also presents with Neck pain.  radicular neck pain in the left shoulder.  The patient reports left neck pain and left shoulder pain, but denies right neck pain, midline neck pain, bilateral neck pain, right shoulder pain, bilateral shoulder pain, upper back pain, and headache.  The pain is described as intermittent and radiates to the left arm.  History is significant for prior neck pain episodes.  Evaluation to date has included X-rays of neck and MRI of neck.   Had seen dr Gerlene Fee for MRI in the past.  Lipid Management History:      Positive NCEP/ATP III risk factors include female age 65 years old or older and hypertension.  Negative NCEP/ATP III risk factors include no family history for ischemic heart disease, non-tobacco-user status, no ASHD  (atherosclerotic heart disease), no prior stroke/TIA, no peripheral vascular disease, and no history of aortic aneurysm.     Preventive Screening-Counseling & Management  Alcohol-Tobacco     Smoking Status: never     Passive Smoke Exposure: no     Tobacco Counseling: not indicated; no tobacco use  Problems Prior to Update: 1)  Dysmetabolic Syndrome  (ICD-277.7) 2)  Benign Neoplasm of Skin Site Unspecified  (ICD-216.9) 3)  Wart, Viral  (ICD-078.10) 4)  Lipoma, Skin  (ICD-214.1) 5)  Cardiomegaly  (ICD-429.3) 6)  Cough  (ICD-786.2) 7)  Neoplasm, Malignant, Skin, Trunk  (ICD-173.5) 8)  Acute Sinusitis, Unspecified  (ICD-461.9) 9)  Preventive Health Care  (ICD-V70.0) 10)  Allergic Rhinitis  (ICD-477.9) 11)  Acute Bronchitis  (ICD-466.0) 12)  Phlebitis&thrombophleb Sup Vessels Lower Extrem  (ICD-451.0) 13)  Hyperlipidemia Nec/nos  (ICD-272.4) 14)  Hypothyroidism  (ICD-244.9) 15)  Hypertension  (ICD-401.9) 16)  Gerd  (ICD-530.81) 17)  Family History Diabetes 1st Degree Relative  (ICD-V18.0) 18)  Osteoporosis  (ICD-733.00)  Current Problems (verified): 1)  Dysmetabolic Syndrome  (ICD-277.7) 2)  Benign Neoplasm of Skin Site Unspecified  (ICD-216.9) 3)  Wart, Viral  (ICD-078.10) 4)  Lipoma, Skin  (ICD-214.1) 5)  Cardiomegaly  (ICD-429.3) 6)  Cough  (ICD-786.2) 7)  Neoplasm, Malignant, Skin, Trunk  (ICD-173.5) 8)  Acute Sinusitis, Unspecified  (ICD-461.9) 9)  Preventive Health Care  (ICD-V70.0) 10)  Allergic Rhinitis  (ICD-477.9) 11)  Acute Bronchitis  (ICD-466.0) 12)  Phlebitis&thrombophleb Sup Vessels Lower Extrem  (ICD-451.0) 13)  Hyperlipidemia Nec/nos  (ICD-272.4) 14)  Hypothyroidism  (ICD-244.9) 15)  Hypertension  (ICD-401.9) 16)  Gerd  (ICD-530.81) 17)  Family History Diabetes 1st Degree Relative  (ICD-V18.0) 18)  Osteoporosis  (ICD-733.00)  Medications Prior to Update: 1)  Folic Acid 1 Mg  Tabs (Folic Acid) .... Once Daily 2)  Oscal 500/200 D-3 500-200 Mg-Unit  Tabs  (Calcium-Vitamin D) .... Once Daily 3)  Synthroid 125 Mcg  Tabs (Levothyroxine Sodium) .... Once Daily 4)  Maxzide-25 37.5-25 Mg  Tabs (Triamterene-Hctz) .... Once Daily 5)  Vytorin 10-40 Mg  Tabs (Ezetimibe-Simvastatin) .... Once Daily 6)  Bayer Low Strength 81 Mg  Tbec (Aspirin) .... Once Daily 7)  Nexium 40 Mg  Cpdr (Esomeprazole Magnesium) .... One By Mouth Daily 8)  Vitamin D3 2000 Unit Caps (Cholecalciferol) .... One By Mouth Daily 9)  Zyrtec Allergy 10 Mg Tabs (Cetirizine Hcl) .Marland Kitchen.. 1 Once Daily 10)  Advil Cold/sinus 30-200 Mg Tabs (Pseudoephedrine-Ibuprofen) .... As Needed 11)  Amoxicillin-Pot Clavulanate 875-125 Mg Tabs (Amoxicillin-Pot Clavulanate) .... One By Mouth  Two Times A Day For 10 Days  Current Medications (verified): 1)  Folic Acid 1 Mg  Tabs (Folic Acid) .... Once Daily 2)  Oscal 500/200 D-3 500-200 Mg-Unit  Tabs (Calcium-Vitamin D) .... Once Daily 3)  Synthroid 125 Mcg  Tabs (Levothyroxine Sodium) .... Once Daily 4)  Maxzide-25 37.5-25 Mg  Tabs (Triamterene-Hctz) .... Once Daily 5)  Vytorin 10-40 Mg  Tabs (Ezetimibe-Simvastatin) .... Once Daily 6)  Bayer Low Strength 81 Mg  Tbec (Aspirin) .... Once Daily 7)  Nexium 40 Mg  Cpdr (Esomeprazole Magnesium) .... One By Mouth Daily 8)  Vitamin D3 2000 Unit Caps (Cholecalciferol) .... One By Mouth Daily 9)  Zyrtec Allergy 10 Mg Tabs (Cetirizine Hcl) .Marland Kitchen.. 1 Once Daily 10)  Advil Cold/sinus 30-200 Mg Tabs (Pseudoephedrine-Ibuprofen) .... As Needed 11)  Benefiber  Tabs (Wheat Dextrin) .... One By Mouth Two Times A Day  Allergies (verified): 1)  ! * Shell Fish  Past History:  Family History: Last updated: 08/24/2006 Family History Diabetes 1st degree relative Family History Hypertension Family History of Stroke M 1st degree relative <50 Fam hc CHF Fam hx MI  Social History: Last updated: 08/24/2006 Never Smoked Alcohol use-yes  Risk Factors: Smoking Status: never (02/05/2010) Passive Smoke Exposure: no  (02/05/2010)  Past medical, surgical, family and social histories (including risk factors) reviewed, and no changes noted (except as noted below).  Past Medical History: Reviewed history from 11/01/2007 and no changes required. Osteoporosis Lipids GERD Hypertension Hypothyroidism Allergic rhinitis  Past Surgical History: Reviewed history from 08/24/2006 and no changes required. TAH-25 yrs  Family History: Reviewed history from 08/24/2006 and no changes required. Family History Diabetes 1st degree relative Family History Hypertension Family History of Stroke M 1st degree relative <50 Fam hc CHF Fam hx MI  Social History: Reviewed history from 08/24/2006 and no changes required. Never Smoked Alcohol use-yes  Review of Systems  The patient denies anorexia, fever, weight loss, weight gain, vision loss, decreased hearing, hoarseness, chest pain, syncope, dyspnea on exertion, peripheral edema, prolonged cough, headaches, hemoptysis, abdominal pain, melena, hematochezia, severe indigestion/heartburn, hematuria, incontinence, genital sores, muscle weakness, suspicious skin lesions, transient blindness, difficulty walking, depression, unusual weight change, abnormal bleeding, enlarged lymph nodes, angioedema, and breast masses.    Physical Exam  General:  Coughing frequently Head:  Normocephalic and atraumatic without obvious abnormalities.  No apparent alopecia or balding. Eyes:  pupils equal and pupils round.   Ears:  R ear normal and L ear normal.   Nose:  no external deformity and no nasal discharge.   Neck:  No deformities, masses, or tenderness noted. Lungs:  Normal respiratory effort, chest expands symmetrically. Lungs are clear to auscultation, no crackles or wheezes. Heart:  Normal rate and regular rhythm. S1 and S2 normal without gallop, murmur, click, rub or other extra sounds. Abdomen:  soft and non-tender.   Msk:  normal ROM, no joint tenderness, and no joint swelling.    Extremities:  No clubbing, cyanosis, edema, or deformity noted with normal full range of motion of all joints.   Neurologic:  alert & oriented X3 and gait normal.     Impression & Recommendations:  Problem # 1:  HYPERTENSION (ICD-401.9) Assessment Unchanged  Her updated medication list for this problem includes:    Maxzide-25 37.5-25 Mg Tabs (Triamterene-hctz) ..... Once daily  BP today: 138/80 Prior BP: 130/72 (12/04/2009)  Prior 10 Yr Risk Heart Disease: Not enough information (06/05/2009)  Labs Reviewed: K+: 4.0 (12/04/2009) Creat: : 0.9 (12/04/2009)   Chol: 219 (12/04/2009)   HDL: 50.50 (12/04/2009)   LDL: DEL (03/12/2008)   TG: 211.0 (12/04/2009)  Problem # 2:  HYPOTHYROIDISM (ICD-244.9) Assessment: Unchanged  Her updated medication list for this problem includes:    Synthroid 125 Mcg Tabs (Levothyroxine sodium) ..... Once daily  Labs Reviewed: TSH: 0.21 (05/29/2009)    Chol: 219 (12/04/2009)   HDL: 50.50 (12/04/2009)   LDL: DEL (03/12/2008)   TG: 211.0 (12/04/2009)  Problem # 3:  HYPERLIPIDEMIA NEC/NOS (ICD-272.4) Assessment: Improved  Her updated medication list for this problem includes:    Vytorin 10-40 Mg Tabs (Ezetimibe-simvastatin) ..... Once daily  Labs Reviewed: SGOT: 23 (05/29/2009)   SGPT: 20 (05/29/2009)  Lipid Goals: Chol Goal: 200 (03/15/2007)   HDL Goal: 40 (03/15/2007)   LDL Goal: 130 (03/15/2007)   TG Goal: 150 (03/15/2007)  Prior 10 Yr Risk Heart Disease: Not enough information (06/05/2009)   HDL:50.50 (12/04/2009), 45.30 (05/29/2009)  LDL:DEL (03/12/2008), 125 (07/20/2007)  Chol:219 (12/04/2009), 290 (05/29/2009)  Trig:211.0 (12/04/2009), 287.0 (05/29/2009)  Problem # 4:  CERVICAL RADICULOPATHY, LEFT (ICD-723.4) Assessment: Unchanged  refer to [facility]?  or at [facility] for neck radiculopaty  Orders: Physical Therapy Referral (PT)  Complete Medication List: 1)  Folic Acid 1 Mg Tabs (Folic acid) .... Once daily 2)  Oscal  500/200 D-3 500-200 Mg-unit Tabs (Calcium-vitamin d) .... Once daily 3)  Synthroid 125 Mcg Tabs (Levothyroxine sodium) .... Once daily 4)  Maxzide-25 37.5-25 Mg Tabs (Triamterene-hctz) .... Once daily 5)  Vytorin 10-40 Mg Tabs (Ezetimibe-simvastatin) .... Once daily 6)  Bayer Low Strength 81 Mg Tbec (Aspirin) .... Once daily 7)  Nexium 40 Mg Cpdr (Esomeprazole magnesium) .... One by mouth daily 8)  Vitamin D3 2000 Unit Caps (Cholecalciferol) .... One by mouth daily 9)  Zyrtec Allergy 10 Mg Tabs (Cetirizine hcl) .Marland Kitchen.. 1 once daily 10)  Advil Cold/sinus 30-200 Mg Tabs (Pseudoephedrine-ibuprofen) .... As needed 11)  Benefiber Tabs (Wheat dextrin) .... One by mouth two times a day 12)  Baclofen 10 Mg Tabs (Baclofen) .... One by mouth three times a day  Other Orders: Doppler Referral (Doppler)  Lipid Assessment/Plan:      Based on NCEP/ATP III, the patient's risk factor category is "2 or more risk factors and a calculated 10 year CAD risk of > 20%".  The patient's lipid goals are  as follows: Total cholesterol goal is 200; LDL cholesterol goal is 130; HDL cholesterol goal is 40; Triglyceride goal is 150.  Her LDL cholesterol goal has not been met.  Secondary causes for hyperlipidemia have been ruled out.  She has been counseled on adjunctive measures for lowering her cholesterol and has been provided with dietary instructions.    Patient Instructions: 1)  go to the physcial therapist for the neck pain and use the baclofen as needed. 2)  You will need an [place] of carotids 3)  consider opthamologist for evaluation of eye 4)  Please schedule a follow-up appointment in 3 months. Prescriptions: BACLOFEN 10 MG TABS (BACLOFEN) one by mouth three times a day  #90 x 3   Entered and Authorized by:   Stacie Glaze MD   Signed by:   Stacie Glaze MD on 02/05/2010   Method used:   Electronically to        [facility]* (retail)       [address]       [place], [place]  [address]       Ph:  [phone]       Fax: [phone]   RxID:   [id number] BACLOFEN 10 MG TABS (BACLOFEN) one by mouth three times a day  #90 x 3   Entered and Authorized by:   Stacie Glaze MD   Signed by:   Stacie Glaze MD on 02/05/2010   Method used:   Electronically to        [facility]. [address] [id number]* (retail)       [address] [place], [place]  [id number]       Ph: [phone]       Fax: [phone]   RxID:   [id number]    Orders Added: 1)  Est. Patient Level IV [35573] 2)  Doppler Referral [Doppler] 3)  Physical Therapy Referral [PT]

## 2010-03-04 NOTE — Assessment & Plan Note (Signed)
Summary: 6 MONTH ROV/NJR   Vital Signs:  Patient profile:   77 year old female Height:      67 inches Weight:      168 pounds BMI:     26.41 Temp:     98.2 degrees F oral Pulse rate:   72 / minute Resp:     14 per minute BP sitting:   130 / 72  (left arm)  Vitals Entered By: Willy Eddy, LPN (December 04, 2009 10:27 AM) CC: roa, Lipid Management Is Patient Diabetic? No   Primary Care Provider:  Stacie Glaze MD  CC:  roa and Lipid Management.  History of Present Illness: I don't feel good "a lot of days" I awaken at night with snoring I cannot sleep on my left side and my hip and knee bother her she does not use a pillow she has a hx of back surgery she has a hx of hypothyroidism  Lipid Management History:      Positive NCEP/ATP III risk factors include female age 36 years old or older and hypertension.  Negative NCEP/ATP III risk factors include no family history for ischemic heart disease and non-tobacco-user status.    Preventive Screening-Counseling & Management  Alcohol-Tobacco     Smoking Status: never     Passive Smoke Exposure: no     Tobacco Counseling: not indicated; no tobacco use  Problems Prior to Update: 1)  Dysmetabolic Syndrome  (ICD-277.7) 2)  Benign Neoplasm of Skin Site Unspecified  (ICD-216.9) 3)  Wart, Viral  (ICD-078.10) 4)  Lipoma, Skin  (ICD-214.1) 5)  Cardiomegaly  (ICD-429.3) 6)  Cough  (ICD-786.2) 7)  Neoplasm, Malignant, Skin, Trunk  (ICD-173.5) 8)  Acute Sinusitis, Unspecified  (ICD-461.9) 9)  Preventive Health Care  (ICD-V70.0) 10)  Allergic Rhinitis  (ICD-477.9) 11)  Acute Bronchitis  (ICD-466.0) 12)  Phlebitis&thrombophleb Sup Vessels Lower Extrem  (ICD-451.0) 13)  Hyperlipidemia Nec/nos  (ICD-272.4) 14)  Hypothyroidism  (ICD-244.9) 15)  Hypertension  (ICD-401.9) 16)  Gerd  (ICD-530.81) 17)  Family History Diabetes 1st Degree Relative  (ICD-V18.0) 18)  Osteoporosis  (ICD-733.00)  Current Problems (verified): 1)   Dysmetabolic Syndrome  (ICD-277.7) 2)  Benign Neoplasm of Skin Site Unspecified  (ICD-216.9) 3)  Wart, Viral  (ICD-078.10) 4)  Lipoma, Skin  (ICD-214.1) 5)  Cardiomegaly  (ICD-429.3) 6)  Cough  (ICD-786.2) 7)  Neoplasm, Malignant, Skin, Trunk  (ICD-173.5) 8)  Acute Sinusitis, Unspecified  (ICD-461.9) 9)  Preventive Health Care  (ICD-V70.0) 10)  Allergic Rhinitis  (ICD-477.9) 11)  Acute Bronchitis  (ICD-466.0) 12)  Phlebitis&thrombophleb Sup Vessels Lower Extrem  (ICD-451.0) 13)  Hyperlipidemia Nec/nos  (ICD-272.4) 14)  Hypothyroidism  (ICD-244.9) 15)  Hypertension  (ICD-401.9) 16)  Gerd  (ICD-530.81) 17)  Family History Diabetes 1st Degree Relative  (ICD-V18.0) 18)  Osteoporosis  (ICD-733.00)  Current Medications (verified): 1)  Folic Acid 1 Mg  Tabs (Folic Acid) .... Once Daily 2)  Oscal 500/200 D-3 500-200 Mg-Unit  Tabs (Calcium-Vitamin D) .... Once Daily 3)  Synthroid 125 Mcg  Tabs (Levothyroxine Sodium) .... Once Daily 4)  Maxzide-25 37.5-25 Mg  Tabs (Triamterene-Hctz) .... Once Daily 5)  Vytorin 10-40 Mg  Tabs (Ezetimibe-Simvastatin) .... Once Daily 6)  Bayer Low Strength 81 Mg  Tbec (Aspirin) .... Once Daily 7)  Nexium 40 Mg  Cpdr (Esomeprazole Magnesium) .... One By Mouth Daily 8)  Vitamin D3 2000 Unit Caps (Cholecalciferol) .... One By Mouth Daily 9)  Zyrtec Allergy 10 Mg Tabs (Cetirizine Hcl) .Marland KitchenMarland KitchenMarland Kitchen 1  Once Daily 10)  Advil Cold/sinus 30-200 Mg Tabs (Pseudoephedrine-Ibuprofen) .... As Needed 11)  Amoxicillin-Pot Clavulanate 875-125 Mg Tabs (Amoxicillin-Pot Clavulanate) .... One By Mouth  Two Times A Day For 10 Days  Allergies (verified): 1)  ! * Shell Fish  Past History:  Family History: Last updated: 08/24/2006 Family History Diabetes 1st degree relative Family History Hypertension Family History of Stroke M 1st degree relative <50 Fam hc CHF Fam hx MI  Social History: Last updated: 08/24/2006 Never Smoked Alcohol use-yes  Risk Factors: Smoking Status: never  (12/04/2009) Passive Smoke Exposure: no (12/04/2009)  Past medical, surgical, family and social histories (including risk factors) reviewed, and no changes noted (except as noted below).  Past Medical History: Reviewed history from 11/01/2007 and no changes required. Osteoporosis Lipids GERD Hypertension Hypothyroidism Allergic rhinitis  Past Surgical History: Reviewed history from 08/24/2006 and no changes required. TAH-25 yrs  Family History: Reviewed history from 08/24/2006 and no changes required. Family History Diabetes 1st degree relative Family History Hypertension Family History of Stroke M 1st degree relative <50 Fam hc CHF Fam hx MI  Social History: Reviewed history from 08/24/2006 and no changes required. Never Smoked Alcohol use-yes  Review of Systems  The patient denies anorexia, fever, weight loss, weight gain, vision loss, decreased hearing, hoarseness, chest pain, syncope, dyspnea on exertion, peripheral edema, prolonged cough, headaches, hemoptysis, abdominal pain, melena, hematochezia, severe indigestion/heartburn, hematuria, incontinence, genital sores, muscle weakness, suspicious skin lesions, transient blindness, difficulty walking, depression, unusual weight change, abnormal bleeding, enlarged lymph nodes, angioedema, and breast masses.         Flu Vaccine Consent Questions     Do you have a history of severe allergic reactions to this vaccine? no    Any prior history of allergic reactions to egg and/or gelatin? no    Do you have a sensitivity to the preservative Thimersol? no    Do you have a past history of Guillan-Barre Syndrome? no    Do you currently have an acute febrile illness? no    Have you ever had a severe reaction to latex? no    Vaccine information given and explained to patient? yes    Are you currently pregnant? no    Lot Number:[id number]   Exp Date:07/31/2010   Site Given  Left Deltoid IM    Physical Exam  General:  Coughing  frequently Head:  Normocephalic and atraumatic without obvious abnormalities. No apparent alopecia or balding. Eyes:  pupils equal and pupils round.   Ears:  R ear normal and L ear normal.   Nose:  no external deformity and no nasal discharge.   Lungs:  Normal respiratory effort, chest expands symmetrically. Lungs are clear to auscultation, no crackles or wheezes. Heart:  Normal rate and regular rhythm. S1 and S2 normal without gallop, murmur, click, rub or other extra sounds. Abdomen:  soft and non-tender.   Msk:  normal ROM, no joint tenderness, and no joint swelling.   Neurologic:  alert & oriented X3 and gait normal.     Impression & Recommendations:  Problem # 1:  HYPOTHYROIDISM (ICD-244.9)  Her updated medication list for this problem includes:    Synthroid 125 Mcg Tabs (Levothyroxine sodium) ..... Once daily  Labs Reviewed: TSH: 0.21 (05/29/2009)    Chol: 290 (05/29/2009)   HDL: 45.30 (05/29/2009)   LDL: DEL (03/12/2008)   TG: 287.0 (05/29/2009)  Problem # 2:  HYPERLIPIDEMIA NEC/NOS (ICD-272.4)  back on medications needs monitering labs today Her updated medication list for this  problem includes:    Vytorin 10-40 Mg Tabs (Ezetimibe-simvastatin) ..... Once daily  Labs Reviewed: SGOT: 23 (05/29/2009)   SGPT: 20 (05/29/2009)  Lipid Goals: Chol Goal: 200 (03/15/2007)   HDL Goal: 40 (03/15/2007)   LDL Goal: 130 (03/15/2007)   TG Goal: 150 (03/15/2007)  Prior 10 Yr Risk Heart Disease: Not enough information (06/05/2009)   HDL:45.30 (05/29/2009), 49.5 (03/12/2008)  LDL:DEL (03/12/2008), 125 (07/20/2007)  Chol:290 (05/29/2009), 203 (03/12/2008)  Trig:287.0 (05/29/2009), 126 (03/12/2008)  Orders: Venipuncture (25366) Specimen Handling (44034) TLB-Lipid Panel (80061-LIPID)  Problem # 3:  ACUTE SINUSITIS, UNSPECIFIED (ICD-461.9) Assessment: Deteriorated  Her updated medication list for this problem includes:    Advil Cold/sinus 30-200 Mg Tabs (Pseudoephedrine-ibuprofen)  .Marland Kitchen... As needed    Amoxicillin-pot Clavulanate 875-125 Mg Tabs (Amoxicillin-pot clavulanate) ..... One by mouth  two times a day for 10 days  Instructed on treatment. Call if symptoms persist or worsen.   Complete Medication List: 1)  Folic Acid 1 Mg Tabs (Folic acid) .... Once daily 2)  Oscal 500/200 D-3 500-200 Mg-unit Tabs (Calcium-vitamin d) .... Once daily 3)  Synthroid 125 Mcg Tabs (Levothyroxine sodium) .... Once daily 4)  Maxzide-25 37.5-25 Mg Tabs (Triamterene-hctz) .... Once daily 5)  Vytorin 10-40 Mg Tabs (Ezetimibe-simvastatin) .... Once daily 6)  Bayer Low Strength 81 Mg Tbec (Aspirin) .... Once daily 7)  Nexium 40 Mg Cpdr (Esomeprazole magnesium) .... One by mouth daily 8)  Vitamin D3 2000 Unit Caps (Cholecalciferol) .... One by mouth daily 9)  Zyrtec Allergy 10 Mg Tabs (Cetirizine hcl) .Marland Kitchen.. 1 once daily 10)  Advil Cold/sinus 30-200 Mg Tabs (Pseudoephedrine-ibuprofen) .... As needed 11)  Amoxicillin-pot Clavulanate 875-125 Mg Tabs (Amoxicillin-pot clavulanate) .... One by mouth  two times a day for 10 days  Other Orders: Flu Vaccine 30yrs + MEDICARE PATIENTS (V4259) Administration Flu vaccine - MCR (G0008) T-Vitamin D (25-Hydroxy) (56387-56433) TLB-BMP (Basic Metabolic Panel-BMET) (80048-METABOL)  Lipid Assessment/Plan:      Based on NCEP/ATP III, the patient's risk factor category is "0-1 risk factors".  The patient's lipid goals are as follows: Total cholesterol goal is 200; LDL cholesterol goal is 130; HDL cholesterol goal is 40; Triglyceride goal is 150.     Patient Instructions: 1)  Please schedule a follow-up appointment in 2 months. 2)  consider sleep study 3)  two advil before sleep Prescriptions: AMOXICILLIN-POT CLAVULANATE 875-125 MG TABS (AMOXICILLIN-POT CLAVULANATE) one by mouth  two times a day for 10 days  #20 x 0   Entered and Authorized by:   Stacie Glaze MD   Signed by:   Stacie Glaze MD on 12/04/2009   Method used:   Print then Give to Patient    RxID:   [id number]    Orders Added: 1)  Flu Vaccine 61yrs + MEDICARE PATIENTS [Q2039] 2)  Administration Flu vaccine - MCR [G0008] 3)  Venipuncture [01093] 4)  T-Vitamin D (25-Hydroxy) [23557-32202] 5)  Est. Patient Level IV [54270] 6)  Specimen Handling [99000] 7)  TLB-Lipid Panel [80061-LIPID] 8)  TLB-BMP (Basic Metabolic Panel-BMET) [80048-METABOL]

## 2010-03-04 NOTE — Letter (Signed)
Summary: [facility]  [facility]   Imported By: Maryln Gottron 06/10/2009 11:17:50  _____________________________________________________________________  External Attachment:    Type:   Image     Comment:   External Document

## 2010-03-09 ENCOUNTER — Ambulatory Visit: Payer: Medicare Other | Attending: Internal Medicine

## 2010-03-09 DIAGNOSIS — M2569 Stiffness of other specified joint, not elsewhere classified: Secondary | ICD-10-CM | POA: Insufficient documentation

## 2010-03-09 DIAGNOSIS — M542 Cervicalgia: Secondary | ICD-10-CM | POA: Insufficient documentation

## 2010-03-09 DIAGNOSIS — IMO0001 Reserved for inherently not codable concepts without codable children: Secondary | ICD-10-CM | POA: Insufficient documentation

## 2010-03-11 ENCOUNTER — Ambulatory Visit: Payer: Medicare Other | Admitting: Physical Therapy

## 2010-03-16 ENCOUNTER — Ambulatory Visit: Payer: Medicare Other

## 2010-03-18 ENCOUNTER — Ambulatory Visit: Payer: Medicare Other

## 2010-03-23 ENCOUNTER — Ambulatory Visit: Payer: Medicare Other

## 2010-03-25 ENCOUNTER — Ambulatory Visit: Payer: Medicare Other

## 2010-05-10 ENCOUNTER — Ambulatory Visit: Payer: Self-pay | Admitting: Internal Medicine

## 2010-05-27 ENCOUNTER — Encounter: Payer: Self-pay | Admitting: Internal Medicine

## 2010-06-04 ENCOUNTER — Encounter: Payer: Self-pay | Admitting: Internal Medicine

## 2010-06-04 ENCOUNTER — Ambulatory Visit (INDEPENDENT_AMBULATORY_CARE_PROVIDER_SITE_OTHER): Payer: Medicare Other | Admitting: Internal Medicine

## 2010-06-04 DIAGNOSIS — R51 Headache: Secondary | ICD-10-CM

## 2010-06-04 DIAGNOSIS — E039 Hypothyroidism, unspecified: Secondary | ICD-10-CM

## 2010-06-04 DIAGNOSIS — F411 Generalized anxiety disorder: Secondary | ICD-10-CM | POA: Insufficient documentation

## 2010-06-04 DIAGNOSIS — K219 Gastro-esophageal reflux disease without esophagitis: Secondary | ICD-10-CM

## 2010-06-04 DIAGNOSIS — Z13220 Encounter for screening for lipoid disorders: Secondary | ICD-10-CM

## 2010-06-04 DIAGNOSIS — E8881 Metabolic syndrome: Secondary | ICD-10-CM

## 2010-06-04 DIAGNOSIS — J309 Allergic rhinitis, unspecified: Secondary | ICD-10-CM

## 2010-06-04 DIAGNOSIS — I10 Essential (primary) hypertension: Secondary | ICD-10-CM

## 2010-06-04 DIAGNOSIS — R4589 Other symptoms and signs involving emotional state: Secondary | ICD-10-CM

## 2010-06-04 LAB — BASIC METABOLIC PANEL
CO2: 29 mEq/L (ref 19–32)
Chloride: 105 mEq/L (ref 96–112)
Sodium: 142 mEq/L (ref 135–145)

## 2010-06-04 LAB — LDL CHOLESTEROL, DIRECT: Direct LDL: 211 mg/dL

## 2010-06-04 LAB — HIGH SENSITIVITY CRP: CRP, High Sensitivity: 2.63 mg/L (ref 0.00–5.00)

## 2010-06-04 LAB — LIPID PANEL: Total CHOL/HDL Ratio: 6

## 2010-06-04 NOTE — Progress Notes (Signed)
Addended by: Rossie Muskrat on: 06/04/2010 11:35 AM   Modules accepted: Orders

## 2010-06-04 NOTE — Progress Notes (Signed)
Subjective:    Patient ID: Amy Lin, female    DOB: 1933-09-15, 77 y.o.   MRN: [id number]  HPI Patient is a 77 year old female who presents for followup of hypertension hyperlipidemia and for acute stress reaction secondary to her son having a severe motor vehicle accident with multiple trauma and he is in [facility].  She states that she's not sleeping well has had increased stress and mild headaches and has been compliant with her medications.  During this period of time she was diagnosed with optic migraines and appropriate blood work was obtained today for her disease.     Review of Systems  Constitutional: Negative for activity change, appetite change and fatigue.  HENT: Negative for ear pain, congestion, neck pain, postnasal drip and sinus pressure.   Eyes: Positive for visual disturbance. Negative for redness.  Respiratory: Negative for cough, shortness of breath and wheezing.   Gastrointestinal: Negative for abdominal pain and abdominal distention.  Genitourinary: Negative for dysuria, frequency and menstrual problem.  Musculoskeletal: Negative for myalgias, joint swelling and arthralgias.  Skin: Negative for rash and wound.  Neurological: Positive for headaches. Negative for dizziness and weakness.  Hematological: Negative for adenopathy. Does not bruise/bleed easily.  Psychiatric/Behavioral: Negative for sleep disturbance and decreased concentration.   Past Medical History  Diagnosis Date  . Osteoporosis   . Hyperlipidemia   . GERD (gastroesophageal reflux disease)   . Hypertension   . Thyroid disease   . Allergy    Past Surgical History  Procedure Date  . Abdominal hysterectomy     reports that she has never smoked. She does not have any smokeless tobacco history on file. She reports that she does not drink alcohol or use illicit drugs. family history includes Diabetes in her father; Heart disease in her father and mother; Hypertension in her father; and  Stroke in her father. No Known Allergies     Objective:   Physical Exam  Constitutional: She is oriented to person, place, and time. She appears well-developed and well-nourished. No distress.  HENT:  Head: Normocephalic and atraumatic.  Right Ear: External ear normal.  Left Ear: External ear normal.  Nose: Nose normal.  Mouth/Throat: Oropharynx is clear and moist.  Eyes: Conjunctivae and EOM are normal. Pupils are equal, round, and reactive to light.  Neck: Normal range of motion. Neck supple. No JVD present. No tracheal deviation present. No thyromegaly present.  Cardiovascular: Normal rate, regular rhythm, normal heart sounds and intact distal pulses.   No murmur heard. Pulmonary/Chest: Effort normal and breath sounds normal. She has no wheezes. She exhibits no tenderness.  Abdominal: Soft. Bowel sounds are normal.  Musculoskeletal: Normal range of motion. She exhibits no edema and no tenderness.  Lymphadenopathy:    She has no cervical adenopathy.  Neurological: She is alert and oriented to person, place, and time. She has normal reflexes. No cranial nerve deficit.  Skin: Skin is warm and dry. She is not diaphoretic.  Psychiatric: She has a normal mood and affect. Her behavior is normal.          Assessment & Plan:  Patient is acute stress reaction mild anxiety lytics were offered but the patient deferred at this time.  She was instructed to seek counseling as this will have a severe impact on her life and she does not.  Blood pressure stable cholesterol will be monitored today with a lipid panel as well as a demand for her hypertensive therapy.  For her migraine headaches we will  get sedimentation rate and CRP to make sure vasculitis as a payroll she's had a carotid Doppler which was clear and she is seeing an ophthalmologist.

## 2010-06-18 NOTE — Op Note (Signed)
Amy Lin, Amy Lin                  ACCOUNT NO.:  [id number]   MEDICAL RECORD NO.:  [id number]          PATIENT TYPE:  INP   LOCATION:  9319                          FACILITY:  [facility]   PHYSICIAN:  Randye Lobo, M.D.   DATE OF BIRTH:  05-16-1933   DATE OF PROCEDURE:  01/12/2005  DATE OF DISCHARGE:                                 OPERATIVE REPORT   PREOPERATIVE DIAGNOSIS:  Incomplete vaginal prolapse.   POSTOPERATIVE DIAGNOSIS:  Incomplete vaginal prolapse.   PROCEDURE:  Anterior and posterior colporrhaphy, enterocele repair.   SURGEON:  Conley Simmonds, M.D.   ASSISTANT:  Lodema Hong, M.D.   ANESTHESIA:  General endotracheal.   IV FLUIDS:  1500 mL Ringer's lactate.   ESTIMATED BLOOD LOSS:  200 mL.   URINE OUTPUT:  300 mL   COMPLICATIONS:  None.   INDICATIONS FOR PROCEDURE:  The patient is a 77 year old gravida 3, para 3-0-  0-2 Caucasian female, status post total abdominal hysterectomy for uterine  fibroids in 1975, who presented with a several year history of progressive  vaginal protrusion. The patient did not have any symptoms of urinary  incontinence. In the office the patient was noted to have a second to third  degree cystocele with good urethral support. She was noted to have a third  degree rectocele and a possible enterocele. The vaginal apex was well  supported. The patient did undergo urodynamic testing in the office with a  vaginal packing to rule out occult genuine stress incontinence and there was  no evidence of this.   The plan was made to proceed with an anterior and posterior colporrhaphy  with a possible enterocele repair and possible vaginal vault suspension.  After risks, benefits, and alternatives were discussed with her.   FINDINGS:  Examination under anesthesia revealed a second-degree cystocele.  There was good support of both the urethra as well as the vaginal apex. The  patient had evidence of a second degree rectocele but actually a third  degree  enterocele.   When the enterocele sac was opened intraoperatively, there was no evidence  of bowel in this region.   Final rectal examination at termination of the procedure documented the  absence of sutures in the rectum.   SPECIMEN:  The enterocele sac was sent to pathology.   PROCEDURE:  The patient was reidentified in the preoperative hold area. She  did receive both PAS hose and PAS stockings and TED hose for DVT  prophylaxis. The patient did receive Ancef 1 gram intravenously for  antibiotic prophylaxis.   In the operating room, general endotracheal anesthesia was induced and the  patient was then placed in the dorsal lithotomy position. The vagina and  perineum were then sterilely prepped and draped and a Foley catheter was  placed inside the bladder.   The patient was examined under anesthesia and the findings were as noted  above. Allis clamps were used to mark the midline of the vagina from the  vaginal apex to approximately 2 cm below the urethral meatus. The vaginal  mucosa was injected with 0.5% lidocaine with  1:200,000 of epinephrine. The  vaginal mucosa was then incised in a vertical fashion using a scalpel. The  Metzenbaum scissors was used to sharply dissect the subvaginal tissue off of  the overlying vaginal mucosa bilaterally. A series of 2-0 Vicryl mattress  sutures were placed for both a [employer] plication at the urethrovesical  junction and then a continuation from this down to the vaginal apex for the  anterior colporrhaphy. Excess vaginal mucosa was then trimmed and the  anterior vaginal wall was closed with a running locked suture of 2-0 Vicryl.   The posterior colporrhaphy and enterocele repair were next performed. Allis  clamps were used to mark the posterior vagina in the midline all the way up  to the level of the vaginal apex. The triangular wedge of epithelium was  excised from the perineal body in the midline and the vagina was then  incised  vertically using a Metzenbaum scissors. Sharp dissection with  Metzenbaum scissors was performed to dissect the vaginal epithelium from the  overlying perirectal fascia as well as the enterocele sac from the posterior  vaginal epithelium. Digital rectal examination was performed at this time to  demarcate the rectum from the enterocele sac. The enterocele sac was  dissected away from the rectum using a combination of sharp and blunt  dissection. The enterocele sac was then held with two hemostat clamps and  was entered sharply with the Metzenbaum scissors. Digital examination  confirmed that this was truly the enterocele sac and that the sac contained  no small bowel or other structures. The peritoneum was then dissected  further away from the overlying fat of the enterocele sac and the excess  peritoneum was sharply excised. The enterocele closure was performed with a  pursestring suture of 2-0 Ethibond. The excess sac material was trimmed away  and sent to pathology. The posterior colporrhaphy was then performed with a  series of vertical mattress sutures of initially 2-0 Vicryl and then 0  Vicryl closer to the perineal body. Excess vaginal mucosa was then trimmed  bilaterally. The posterior vagina was closed with a running locked suture of  2-0 Vicryl which was continued along the perineal body as for an episiotomy.  Please note that two crown sutures of 0 Vicryl were used to create support  along the perineum. Final rectal examination confirmed the absence of  sutures in the rectum. A vaginal packing of plain gauze with Estrace cream  was then placed inside the vagina.   The patient was cleansed of any remaining Betadine and taken out of the  dorsal lithotomy position. She was awakened and extubated and escorted to  the recovery room in stable and awake condition. There were no complications to the procedure. All needle, instrument, sponge counts were correct.      Randye Lobo,  M.D.  Electronically Signed     BES/MEDQ  D:  01/12/2005  T:  01/12/2005  Job:  [id number]

## 2010-06-18 NOTE — H&P (Signed)
NAMEELIKA, GODAR                  ACCOUNT NO.:  [id number]   MEDICAL RECORD NO.:  [id number]          PATIENT TYPE:  INP   LOCATION:  NA                            FACILITY:  [facility]   PHYSICIAN:  Randye Lobo, M.D.   DATE OF BIRTH:  07/02/1933   DATE OF ADMISSION:  DATE OF DISCHARGE:                                HISTORY & PHYSICAL   CHIEF COMPLAINT:  Vaginal protrusion.   HISTORY OF PRESENT ILLNESS:  The patient is a 77 year old gravida 3, para 3-  0-0-2, Caucasian female, status post total abdominal hysterectomy for  uterine fibroids in 1975, who presents with a several year history of  vaginal protrusion.  The patient has noticed progressively symptoms of  prolapse.  She denies any history of urinary incontinence.  She does report  urinary urgency.  She has had a history of fecal urgency which she treats  with Metamucil as needed.   The patient, in the office, was noted to have a second to third degree  cystocele and she therefore underwent urodynamic testing with a vaginal  packing and there was no evidence of genuine stress incontinence at a  maximum capacity of 321 cc.  On the patient's uroflometry study, she had an  intermittent flow with a small void of 98 cc and a post void residual of 65  cc.   The patient wishes for surgical treatment of her prolapse.   OBSTETRIC/GYNECOLOGIC HISTORY:  1.  The patient is status post vaginal delivery x3.      1.  One of her children is deceased.  2.  Status post total abdominal hysterectomy for uterine fibroids in 1975.  3.  The patient's last Pap smear was performed in July 2006, and was within      normal limits.   PAST MEDICAL HISTORY:  1.  Hypothyroidism .  2.  Osteopenia.  3.  Lower extremity edema.   PAST SURGICAL HISTORY:  1.  Status post total abdominal hysterectomy in 1975.  2.  Status post back surgery in 1998.  3.  Status post cataract surgery.   MEDICATIONS:  Zyrtec, folic acid, Synthroid, Triamterene, low dose  aspirin,  calcium, vitamin E, Nexium.   ALLERGIES:  No known drug allergies.   SOCIAL HISTORY:  The patient is single.  She denies the use of tobacco,  alcohol, or illicit drugs.   FAMILY HISTORY:  Positive for coronary artery disease in the patient's  mother, father, sister, and grandmother.  Negative for cancer of the breast,  ovary, uterus, or colon.   PHYSICAL EXAMINATION:  VITAL SIGNS:  Height 5 feet 6.5 inches, weight 166  pounds, blood pressure 120/70.  GENERAL:  The patient is a middle-aged Caucasian female in no acute  distress.  HEENT:  Normocephalic, atraumatic.  LUNGS:  Clear to auscultation bilaterally.  HEART:  S1 S2 with a regular rate and rhythm.  ABDOMEN:  There is a well healed Pfannenstiel incision without evidence of  herniation.  The abdomen is soft and nontender and without evidence of  hepatosplenomegaly or organomegaly.  PELVIC:  Normal external  genitalia and urethra.  There is evidence of a  second to third degree cystocele, a third degree rectocele, and a possible  enterocele.  The apex of the vagina appears to be fairly well supported.  The cervix and uterus are absent.  There is no evidence of any adnexal mass.  The anus demonstrates no lesions.   IMPRESSION:  The patient is a 77 year old female, status post total  abdominal hysterectomy, who has symptomatic incomplete vaginal prolapse.   PLAN:  The patient will undergo an anterior and posterior colporrhaphy with  possible enterocele repair and possible iliococcygeal vaginal vault  suspension at the [facility] [place] on January 12, 2005 at  8:30 a.m.  Risks, benefits, and alternatives have been discussed with the  patient who wishes to proceed.      Randye Lobo, M.D.  Electronically Signed     BES/MEDQ  D:  01/11/2005  T:  01/11/2005  Job:  [id number]

## 2010-06-24 ENCOUNTER — Ambulatory Visit (INDEPENDENT_AMBULATORY_CARE_PROVIDER_SITE_OTHER): Payer: Medicare Other | Admitting: Internal Medicine

## 2010-06-24 ENCOUNTER — Encounter: Payer: Self-pay | Admitting: Internal Medicine

## 2010-06-24 ENCOUNTER — Ambulatory Visit: Payer: Medicare Other | Admitting: Family Medicine

## 2010-06-24 DIAGNOSIS — M25569 Pain in unspecified knee: Secondary | ICD-10-CM

## 2010-06-24 DIAGNOSIS — I10 Essential (primary) hypertension: Secondary | ICD-10-CM

## 2010-06-24 DIAGNOSIS — M25562 Pain in left knee: Secondary | ICD-10-CM

## 2010-06-24 NOTE — Patient Instructions (Signed)
Call Tuesday for orthopedic referral if left knee pain is unimproved

## 2010-06-24 NOTE — Progress Notes (Signed)
  Subjective:    Patient ID: Amy Lin, female    DOB: [birth year], 77 y.o.   MRN: [id number]  HPI  77 year old patient who presents with a chief complaint of left knee pain. 7 or 8 days ago she tripped in her garage falling forward sustaining trauma to the left knee area. She's had some persistent pain. She had the soft tissue swelling about the knee which has resolved for the past several days she has been using ibuprofen and has been applying cold compresses. She feels her knee has improved but is still uncomfortable.  Review of Systems  Musculoskeletal: Positive for joint swelling and gait problem.       Objective:   Physical Exam  Constitutional: She appears well-developed and well-nourished. No distress.  Musculoskeletal: Normal range of motion. She exhibits tenderness. She exhibits no edema.       The left knee was examined. No obvious effusion there was some tenderness over the patellar ligament and also the lateral joint line. There is some excessive warmth over the patellar ligament. The patella did not appear to be particularly tender. She was able to fully extend the knee without difficulty. There is no calf tenderness or peripheral edema          Assessment & Plan:    left knee contusion.  Options were discussed. With her prior history of effusion patient is aware of the possibility of a meniscal tear. Presently exam is most consistent with contusion and atraumatic patellar tendinitis. No obvious effusion at this time. The patient was offered a prompt orthopedic referral. She wishes to wait until after the long Memorial Day holiday and if unimproved on Tuesday will contact the office for referral. We'll place on Celebrex 400 mg daily

## 2010-06-29 ENCOUNTER — Telehealth: Payer: Self-pay | Admitting: *Deleted

## 2010-06-29 DIAGNOSIS — M25569 Pain in unspecified knee: Secondary | ICD-10-CM

## 2010-06-29 MED ORDER — TRAMADOL HCL 50 MG PO TABS: 50.0000 mg | ORAL_TABLET | Freq: Four times a day (QID) | ORAL | Status: DC | PRN

## 2010-06-29 NOTE — Telephone Encounter (Signed)
Tramadol 50  #50  One every 6 hrs for pain; Ortho referral OK

## 2010-06-29 NOTE — Telephone Encounter (Signed)
Pt is still having pain left knee and wants pain meds.  Celebrex is not helping.  Also, wants a referral to Ortho.  Phamacy is [place].

## 2010-06-29 NOTE — Telephone Encounter (Signed)
Notified pt. 

## 2010-09-03 ENCOUNTER — Ambulatory Visit (INDEPENDENT_AMBULATORY_CARE_PROVIDER_SITE_OTHER): Payer: Medicare Other | Admitting: Internal Medicine

## 2010-09-03 ENCOUNTER — Encounter: Payer: Self-pay | Admitting: Internal Medicine

## 2010-09-03 VITALS — BP 140/90 | HR 72 | Temp 98.2°F | Resp 16 | Ht 67.0 in | Wt 165.0 lb

## 2010-09-03 DIAGNOSIS — E039 Hypothyroidism, unspecified: Secondary | ICD-10-CM

## 2010-09-03 DIAGNOSIS — Z8781 Personal history of (healed) traumatic fracture: Secondary | ICD-10-CM

## 2010-09-03 DIAGNOSIS — D1739 Benign lipomatous neoplasm of skin and subcutaneous tissue of other sites: Secondary | ICD-10-CM

## 2010-09-03 DIAGNOSIS — M25569 Pain in unspecified knee: Secondary | ICD-10-CM

## 2010-09-03 DIAGNOSIS — I10 Essential (primary) hypertension: Secondary | ICD-10-CM

## 2010-09-03 MED ORDER — DICLOFENAC SODIUM 1 % TD GEL: 1.0000 "application " | Freq: Four times a day (QID) | TRANSDERMAL | Status: DC

## 2010-09-03 NOTE — Progress Notes (Signed)
  Subjective:    Patient ID: Amy Lin, female    DOB: 1933/03/06, 77 y.o.   MRN: [id number]  HPI  Larey Seat and cracked knee cap and was seen and referred to orthopedist ( Dr Amparo Bristol) Used an immobilizer  For 6 weeks MRI showed torn tendon Cannot kneel cannot squat Has not worn any knee support Has not gone to PT Used topical NSAIDs    Review of Systems  Constitutional: Negative for activity change, appetite change and fatigue.  HENT: Negative for ear pain, congestion, neck pain, postnasal drip and sinus pressure.   Eyes: Negative for redness and visual disturbance.  Respiratory: Negative for cough, shortness of breath and wheezing.   Gastrointestinal: Negative for abdominal pain and abdominal distention.  Genitourinary: Negative for dysuria, frequency and menstrual problem.  Musculoskeletal: Positive for joint swelling. Negative for myalgias and arthralgias.  Skin: Negative for rash and wound.  Neurological: Negative for dizziness, weakness and headaches.  Hematological: Negative for adenopathy. Does not bruise/bleed easily.  Psychiatric/Behavioral: Negative for sleep disturbance and decreased concentration.   Past Medical History  Diagnosis Date  . Osteoporosis   . Hyperlipidemia   . GERD (gastroesophageal reflux disease)   . Hypertension   . Thyroid disease   . Allergy    Past Surgical History  Procedure Date  . Abdominal hysterectomy     reports that she has never smoked. She does not have any smokeless tobacco history on file. She reports that she does not drink alcohol or use illicit drugs. family history includes Diabetes in her father; Heart disease in her father and mother; Hypertension in her father; and Stroke in her father. No Known Allergies     Objective:   Physical Exam  Nursing note and vitals reviewed. Constitutional: She is oriented to person, place, and time. She appears well-developed and well-nourished. No distress.  HENT:  Head: Normocephalic and  atraumatic.  Right Ear: External ear normal.  Left Ear: External ear normal.  Nose: Nose normal.  Mouth/Throat: Oropharynx is clear and moist.  Eyes: Conjunctivae and EOM are normal. Pupils are equal, round, and reactive to light.  Neck: Normal range of motion. Neck supple. No JVD present. No tracheal deviation present. No thyromegaly present.  Cardiovascular: Normal rate, regular rhythm, normal heart sounds and intact distal pulses.   No murmur heard. Pulmonary/Chest: Effort normal and breath sounds normal. She has no wheezes. She exhibits no tenderness.  Abdominal: Soft. Bowel sounds are normal.  Musculoskeletal: Normal range of motion. She exhibits no edema and no tenderness.  Lymphadenopathy:    She has no cervical adenopathy.  Neurological: She is alert and oriented to person, place, and time. She has normal reflexes. No cranial nerve deficit.  Skin: Skin is warm and dry. She is not diaphoretic.  Psychiatric: She has a normal mood and affect. Her behavior is normal.          Assessment & Plan:  Worsening knee pain I suggested she return to the orthopedist for further evaluation I do not believe that the current diagnosis is appropriate an MRI of the knee should be pursued toward ligaments are probable.  Blood pressure is stable at this time gastroesophageal reflux is stable at this time

## 2010-09-03 NOTE — Patient Instructions (Signed)
Go to a [employer] and get a underarmour   black soft knee brace wt a hole over the knee cap

## 2010-09-21 ENCOUNTER — Ambulatory Visit (INDEPENDENT_AMBULATORY_CARE_PROVIDER_SITE_OTHER): Payer: Medicare Other | Admitting: Family Medicine

## 2010-09-21 ENCOUNTER — Encounter: Payer: Self-pay | Admitting: Family Medicine

## 2010-09-21 VITALS — BP 126/70 | HR 75 | Temp 98.1°F | Wt 167.0 lb

## 2010-09-21 DIAGNOSIS — H5710 Ocular pain, unspecified eye: Secondary | ICD-10-CM

## 2010-09-21 NOTE — Progress Notes (Signed)
  Subjective:    Patient ID: Amy Lin, female    DOB: April 11, 1933, 77 y.o.   MRN: [id number]  HPI Here for 3 days of pain and burning in the left eye, redness in the left eye, pain in the left cheek area, and hypersensitivity to touch on the left parietal and temporal scalp. No vision changes. No ST or fever or cough. Last week she had dental work to repair a broken bridge on the left upper teeth which involved local anesthesia. She has taken some Advil yesterday and this am. She has seen no rashes anywhere.    Review of Systems  Constitutional: Negative.   HENT: Positive for dental problem. Negative for ear pain, congestion, sore throat, facial swelling, neck pain, neck stiffness, postnasal drip and sinus pressure.   Eyes: Positive for pain and redness. Negative for photophobia, discharge, itching and visual disturbance.  Respiratory: Negative.   Skin: Negative.        Objective:   Physical Exam  Constitutional: She appears well-developed and well-nourished.  HENT:  Head: Normocephalic and atraumatic.  Right Ear: External ear normal.  Left Ear: External ear normal.  Nose: Nose normal.  Mouth/Throat: Oropharynx is clear and moist. No oropharyngeal exudate.  Eyes: EOM are normal. Pupils are equal, round, and reactive to light. Right eye exhibits no discharge. Left eye exhibits no discharge. No scleral icterus.       The left conjunctiva shows several engorged capillaries, the cornea is clear . No photophobia   Neck: Normal range of motion. Neck supple. No thyromegaly present.  Pulmonary/Chest: Effort normal and breath sounds normal. No respiratory distress. She has no wheezes. She has no rales. She exhibits no tenderness.  Lymphadenopathy:    She has no cervical adenopathy.  Skin: Skin is warm. No rash noted. No erythema. No pallor.          Assessment & Plan:  The sudden appearance of left eye pain and redness makes one think of an acute viral infection. Certainly herpes  zoster comes to mind as a possibility especially with the tenderness and sensitivity she has in the left scalp and left face. Another possibility would be acute glaucoma. We will have her see [facility] this afternoon. She will let us know if any rashes appear

## 2010-09-23 ENCOUNTER — Ambulatory Visit (INDEPENDENT_AMBULATORY_CARE_PROVIDER_SITE_OTHER): Payer: Medicare Other | Admitting: Internal Medicine

## 2010-09-23 ENCOUNTER — Encounter: Payer: Self-pay | Admitting: Internal Medicine

## 2010-09-23 VITALS — BP 110/70 | Temp 98.6°F | Wt 168.0 lb

## 2010-09-23 DIAGNOSIS — I10 Essential (primary) hypertension: Secondary | ICD-10-CM

## 2010-09-23 MED ORDER — HYDROCODONE-HOMATROPINE 5-1.5 MG/5ML PO SYRP: 5.0000 mL | ORAL_SOLUTION | Freq: Four times a day (QID) | ORAL | Status: AC | PRN

## 2010-09-23 NOTE — Patient Instructions (Signed)
Acute bronchitis symptoms for less than 10 days are generally not helped by antibiotics.  Take over-the-counter expectorants and cough medications such as  Mucinex DM.  Call if there is no improvement in 5 to 7 days or if he developed worsening cough, fever, or new symptoms, such as shortness of breath or chest pain.    

## 2010-09-23 NOTE — Progress Notes (Signed)
  Subjective:    Patient ID: Amy Lin, female    DOB: 03/24/1933, 77 y.o.   MRN: [id number]  HPI  a 77 year old patient who presents with a four-day history of cough. She was seen here 3 days ago with acute redness of the left eye and was referred to [facility] ophthalmology. She was felt to have a conjunctivitis. Complaints include worsening productive cough low-grade fever chills headache and a general sense of unwellness. She has treated hypertension and dyslipidemia. Sputum is described as discolored. No chest pain shortness of breath or wheezing    Review of Systems  Constitutional: Positive for fever and fatigue.  HENT: Negative for hearing loss, congestion, sore throat, rhinorrhea, dental problem, sinus pressure and tinnitus.   Eyes: Negative for pain, discharge and visual disturbance.  Respiratory: Positive for cough. Negative for shortness of breath and wheezing.   Cardiovascular: Negative for chest pain, palpitations and leg swelling.  Gastrointestinal: Negative for nausea, vomiting, abdominal pain, diarrhea, constipation, blood in stool and abdominal distention.  Genitourinary: Negative for dysuria, urgency, frequency, hematuria, flank pain, vaginal bleeding, vaginal discharge, difficulty urinating, vaginal pain and pelvic pain.  Musculoskeletal: Negative for joint swelling, arthralgias and gait problem.  Skin: Negative for rash.  Neurological: Positive for weakness. Negative for dizziness, syncope, speech difficulty, numbness and headaches.  Hematological: Negative for adenopathy.  Psychiatric/Behavioral: Negative for behavioral problems, dysphoric mood and agitation. The patient is not nervous/anxious.        Objective:   Physical Exam  Constitutional: She is oriented to person, place, and time. She appears well-developed and well-nourished.       Appears unwell but nontoxic and in no acute distress  Temperature 98.6 \ Blood pressure 110/70  HENT:  Head:  Normocephalic.  Right Ear: External ear normal.  Left Ear: External ear normal.  Mouth/Throat: Oropharynx is clear and moist.  Eyes: Conjunctivae and EOM are normal. Pupils are equal, round, and reactive to light.  Neck: Normal range of motion. Neck supple. No thyromegaly present.  Cardiovascular: Normal rate, regular rhythm, normal heart sounds and intact distal pulses.   Pulmonary/Chest: Breath sounds normal. No respiratory distress. She has no wheezes. She has no rales.  Abdominal: Soft. Bowel sounds are normal. She exhibits no mass. There is no tenderness.  Musculoskeletal: Normal range of motion.  Lymphadenopathy:    She has no cervical adenopathy.  Neurological: She is alert and oriented to person, place, and time.  Skin: Skin is warm and dry. No rash noted.  Psychiatric: She has a normal mood and affect. Her behavior is normal.          Assessment & Plan:   Hypertension well controlled URI.  We'll treat symptomatically

## 2010-09-27 ENCOUNTER — Telehealth: Payer: Self-pay | Admitting: *Deleted

## 2010-09-27 MED ORDER — AZITHROMYCIN 250 MG PO TABS: ORAL_TABLET | ORAL | Status: AC

## 2010-09-27 NOTE — Telephone Encounter (Signed)
Per Dr. Lovell Sheehan,  Z pack.  Notified pt.

## 2010-09-27 NOTE — Telephone Encounter (Signed)
Per dr jenkins- z pack 

## 2010-09-27 NOTE — Telephone Encounter (Signed)
Per dr Lovell Sheehan- z pack

## 2010-09-27 NOTE — Telephone Encounter (Signed)
Pt is coughing up yellow and running fever x one week.  Is more sick than she was when she saw Dr. Kirtland Bouchard last Wed.  Is insisting she see or talk to Dr. Shela Commons.  States she is sicker than she has ever been.  Has not been on anything but cough meds, no antibiotic.

## 2010-10-05 ENCOUNTER — Encounter: Payer: Self-pay | Admitting: Internal Medicine

## 2010-10-19 ENCOUNTER — Ambulatory Visit (INDEPENDENT_AMBULATORY_CARE_PROVIDER_SITE_OTHER): Payer: Medicare Other | Admitting: Internal Medicine

## 2010-10-19 DIAGNOSIS — I10 Essential (primary) hypertension: Secondary | ICD-10-CM

## 2010-10-19 DIAGNOSIS — E785 Hyperlipidemia, unspecified: Secondary | ICD-10-CM

## 2010-10-19 DIAGNOSIS — R059 Cough, unspecified: Secondary | ICD-10-CM

## 2010-10-19 DIAGNOSIS — R05 Cough: Secondary | ICD-10-CM

## 2010-10-19 DIAGNOSIS — J189 Pneumonia, unspecified organism: Secondary | ICD-10-CM

## 2010-10-19 LAB — CBC WITH DIFFERENTIAL/PLATELET
Basophils Absolute: 0 10*3/uL (ref 0.0–0.1)
Eosinophils Absolute: 0.3 10*3/uL (ref 0.0–0.7)
HCT: 39.5 % (ref 36.0–46.0)
Lymphs Abs: 1.7 10*3/uL (ref 0.7–4.0)
MCHC: 33.4 g/dL (ref 30.0–36.0)
MCV: 91.4 fl (ref 78.0–100.0)
Monocytes Absolute: 0.7 10*3/uL (ref 0.1–1.0)
Neutrophils Relative %: 63.3 % (ref 43.0–77.0)
Platelets: 218 10*3/uL (ref 150.0–400.0)
RDW: 13.5 % (ref 11.5–14.6)
WBC: 7.5 10*3/uL (ref 4.5–10.5)

## 2010-10-19 LAB — LDL CHOLESTEROL, DIRECT: Direct LDL: 151.9 mg/dL

## 2010-10-19 LAB — BASIC METABOLIC PANEL
CO2: 30 mEq/L (ref 19–32)
Chloride: 103 mEq/L (ref 96–112)
Potassium: 3.4 mEq/L — ABNORMAL LOW (ref 3.5–5.1)

## 2010-10-19 LAB — LIPID PANEL
Total CHOL/HDL Ratio: 6
VLDL: 45.2 mg/dL — ABNORMAL HIGH (ref 0.0–40.0)

## 2010-11-01 ENCOUNTER — Ambulatory Visit (INDEPENDENT_AMBULATORY_CARE_PROVIDER_SITE_OTHER): Payer: Medicare Other | Admitting: Internal Medicine

## 2010-11-01 DIAGNOSIS — I10 Essential (primary) hypertension: Secondary | ICD-10-CM

## 2010-11-01 DIAGNOSIS — E785 Hyperlipidemia, unspecified: Secondary | ICD-10-CM

## 2010-11-01 DIAGNOSIS — M1710 Unilateral primary osteoarthritis, unspecified knee: Secondary | ICD-10-CM

## 2010-11-01 DIAGNOSIS — J069 Acute upper respiratory infection, unspecified: Secondary | ICD-10-CM

## 2010-11-01 NOTE — Progress Notes (Signed)
  Subjective:    Patient ID: Amy Lin, female    DOB: March 07, 1933, 77 y.o.   MRN: [id number]  HPI Knee pain and followup of upper respiratory tract infection she's completed antibiotics for upper respiratory infections and feels better.  She has a chief complaint today of pain in her right knee. She has a history of osteoarthritis she has no known injury she does report some swelling tenderness and pain with ambulation   Review of Systems  Constitutional: Negative for activity change, appetite change and fatigue.  HENT: Negative for ear pain, congestion, neck pain, postnasal drip and sinus pressure.   Eyes: Negative for redness and visual disturbance.  Respiratory: Negative for cough, shortness of breath and wheezing.   Gastrointestinal: Negative for abdominal pain and abdominal distention.  Genitourinary: Negative for dysuria, frequency and menstrual problem.  Musculoskeletal: Negative for myalgias, joint swelling and arthralgias.  Skin: Negative for rash and wound.  Neurological: Negative for dizziness, weakness and headaches.  Hematological: Negative for adenopathy. Does not bruise/bleed easily.  Psychiatric/Behavioral: Negative for sleep disturbance and decreased concentration.       Objective:   Physical Exam  Nursing note and vitals reviewed. Constitutional: She is oriented to person, place, and time. She appears well-developed and well-nourished. No distress.  HENT:  Head: Normocephalic and atraumatic.  Right Ear: External ear normal.  Left Ear: External ear normal.  Nose: Nose normal.  Mouth/Throat: Oropharynx is clear and moist.  Eyes: Conjunctivae and EOM are normal. Pupils are equal, round, and reactive to light.  Neck: Normal range of motion. Neck supple. No JVD present. No tracheal deviation present. No thyromegaly present.  Cardiovascular: Normal rate, regular rhythm, normal heart sounds and intact distal pulses.   No murmur heard. Pulmonary/Chest: Effort normal  and breath sounds normal. She has no wheezes. She exhibits no tenderness.  Abdominal: Soft. Bowel sounds are normal.  Musculoskeletal: Normal range of motion. She exhibits no edema and no tenderness.  Lymphadenopathy:    She has no cervical adenopathy.  Neurological: She is alert and oriented to person, place, and time. She has normal reflexes. No cranial nerve deficit.  Skin: Skin is warm and dry. She is not diaphoretic.  Psychiatric: She has a normal mood and affect. Her behavior is normal.          Assessment & Plan:   Informed consent obtained and the patient's right knee was prepped with Betadine local anesthesia obtained with topical spray 40 mg of Depo-Medrol and 1/2 cc of lidocaine was injected into the joint space the patient tolerated the injection well post injection care discussed with patient ice placed  Upper respiratory tract infection is resolved

## 2010-12-04 NOTE — Progress Notes (Signed)
System Downtime Recovery The EMR experienced a system downtime.  This downtime occurred on 10-19-2010. During this downtime paper charting was completed by the provider.  The visit was documented on paper during the downtime and will be scanned into [facility], billing was completed by the [facility] Billing Department .  The visit is being closed on behalf of the provider. 

## 2010-12-10 ENCOUNTER — Telehealth: Payer: Self-pay | Admitting: *Deleted

## 2010-12-10 NOTE — Telephone Encounter (Signed)
Notified pt. 

## 2010-12-10 NOTE — Telephone Encounter (Signed)
To ortho per dr jenkins 

## 2010-12-10 NOTE — Telephone Encounter (Signed)
Per dr Lovell Sheehan- to ortho

## 2010-12-10 NOTE — Telephone Encounter (Signed)
Pt's old knee cap injury has begun to give her pain again.  The injection that Dr. Lovell Sheehan gave her helped for a while but is  Hurting worse now and does not know whether to go to Ortho of see Dr. Shela Commons??? Takes Ibuprofen.

## 2011-01-31 MED ORDER — METHYLPREDNISOLONE ACETATE PF 40 MG/ML IJ SUSP: 40.0000 mg | Freq: Once | INTRAMUSCULAR | Status: DC

## 2011-01-31 NOTE — Patient Instructions (Signed)
You have received a steroid injection into a joint space. It will take up to 48 hours before you notice a difference in the pain in the joint. For the next few hours keep ice on the site of the injection. Do not exert the injected joint for the next 24 hours.  

## 2011-03-04 ENCOUNTER — Ambulatory Visit (INDEPENDENT_AMBULATORY_CARE_PROVIDER_SITE_OTHER): Payer: Medicare Other | Admitting: Internal Medicine

## 2011-03-04 ENCOUNTER — Encounter: Payer: Self-pay | Admitting: Internal Medicine

## 2011-03-04 ENCOUNTER — Ambulatory Visit (INDEPENDENT_AMBULATORY_CARE_PROVIDER_SITE_OTHER)
Admission: RE | Admit: 2011-03-04 | Discharge: 2011-03-04 | Disposition: A | Discharge status: Home / Self Care | Payer: Medicare Other | Source: Ambulatory Visit

## 2011-03-04 VITALS — BP 136/80 | HR 72 | Temp 98.1°F | Resp 16 | Ht 66.5 in | Wt 166.0 lb

## 2011-03-04 DIAGNOSIS — M25569 Pain in unspecified knee: Secondary | ICD-10-CM

## 2011-03-04 DIAGNOSIS — M1710 Unilateral primary osteoarthritis, unspecified knee: Secondary | ICD-10-CM

## 2011-03-04 DIAGNOSIS — E039 Hypothyroidism, unspecified: Secondary | ICD-10-CM

## 2011-03-04 DIAGNOSIS — T887XXA Unspecified adverse effect of drug or medicament, initial encounter: Secondary | ICD-10-CM

## 2011-03-04 DIAGNOSIS — M67919 Unspecified disorder of synovium and tendon, unspecified shoulder: Secondary | ICD-10-CM

## 2011-03-04 DIAGNOSIS — E785 Hyperlipidemia, unspecified: Secondary | ICD-10-CM

## 2011-03-04 DIAGNOSIS — Z13820 Encounter for screening for osteoporosis: Secondary | ICD-10-CM

## 2011-03-04 DIAGNOSIS — I10 Essential (primary) hypertension: Secondary | ICD-10-CM

## 2011-03-04 LAB — BASIC METABOLIC PANEL
CO2: 27 mEq/L (ref 19–32)
Calcium: 9.4 mg/dL (ref 8.4–10.5)
Chloride: 105 mEq/L (ref 96–112)
Glucose, Bld: 105 mg/dL — ABNORMAL HIGH (ref 70–99)
Potassium: 3.9 mEq/L (ref 3.5–5.1)
Sodium: 141 mEq/L (ref 135–145)

## 2011-03-04 LAB — LIPID PANEL
Cholesterol: 211 mg/dL — ABNORMAL HIGH (ref 0–200)
Total CHOL/HDL Ratio: 4
Triglycerides: 140 mg/dL (ref 0.0–149.0)
VLDL: 28 mg/dL (ref 0.0–40.0)

## 2011-03-04 LAB — LDL CHOLESTEROL, DIRECT: Direct LDL: 136 mg/dL

## 2011-03-04 LAB — HEPATIC FUNCTION PANEL
AST: 19 U/L (ref 0–37)
Albumin: 3.8 g/dL (ref 3.5–5.2)
Total Bilirubin: 0.7 mg/dL (ref 0.3–1.2)

## 2011-03-04 MED ORDER — TRIAMTERENE-HCTZ 37.5-25 MG PO TABS: 1.0000 | ORAL_TABLET | Freq: Every day | ORAL | Status: DC

## 2011-03-04 MED ORDER — FOLIC ACID 1 MG PO TABS: 1.0000 mg | ORAL_TABLET | Freq: Every day | ORAL | Status: DC

## 2011-03-04 MED ORDER — ESOMEPRAZOLE MAGNESIUM 40 MG PO CPDR: 40.0000 mg | DELAYED_RELEASE_CAPSULE | Freq: Every day | ORAL | Status: DC

## 2011-03-04 MED ORDER — DICLOFENAC SODIUM 1 % TD GEL: 1.0000 "application " | Freq: Four times a day (QID) | TRANSDERMAL | Status: DC

## 2011-03-04 MED ORDER — LEVOTHYROXINE SODIUM 125 MCG PO TABS: 125.0000 ug | ORAL_TABLET | Freq: Every day | ORAL | Status: DC

## 2011-03-04 NOTE — Progress Notes (Signed)
Subjective:    Patient ID: Amy Lin, female    DOB: 04/25/1933, 78 y.o.   MRN: [id number]  HPI Patient had rotator cuff/shoulder pain that began in October initially we thought that this was possible bursitis that developed when she had a falling injury during the summer months.  But the pain has persisted and there are certain movements of the shoulder that exacerbate the pain in a way that suggests either rotator cuff tear or rotator cuff impingement. The patient presents today for followup of her hypertension hyperlipidemia and hypothyroidism and will have lab work drawn today   Review of Systems  Constitutional: Negative for activity change, appetite change and fatigue.  HENT: Negative for ear pain, congestion, neck pain, postnasal drip and sinus pressure.   Eyes: Negative for redness and visual disturbance.  Respiratory: Negative for cough, shortness of breath and wheezing.   Gastrointestinal: Negative for abdominal pain and abdominal distention.  Genitourinary: Negative for dysuria, frequency and menstrual problem.  Musculoskeletal: Positive for joint swelling and arthralgias. Negative for myalgias.  Skin: Negative for rash and wound.  Neurological: Positive for weakness. Negative for dizziness and headaches.  Hematological: Negative for adenopathy. Does not bruise/bleed easily.  Psychiatric/Behavioral: Negative for sleep disturbance and decreased concentration.   Past Medical History  Diagnosis Date  . Osteoporosis   . Hyperlipidemia   . GERD (gastroesophageal reflux disease)   . Hypertension   . Thyroid disease   . Allergy     History   Social History  . Marital Status: Divorced    Spouse Name: N/A    Number of Children: N/A  . Years of Education: N/A   Occupational History  . Not on file.   Social History Main Topics  . Smoking status: Never Smoker   . Smokeless tobacco: Never Used  . Alcohol Use: No  . Drug Use: No  . Sexually Active: Yes   Other  Topics Concern  . Not on file   Social History Narrative  . No narrative on file    Past Surgical History  Procedure Date  . Abdominal hysterectomy     Family History  Problem Relation Age of Onset  . Heart disease Mother   . Stroke Father   . Hypertension Father   . Heart disease Father   . Diabetes Father     No Known Allergies  Current Outpatient Prescriptions on File Prior to Visit  Medication Sig Dispense Refill  . aspirin 81 MG EC tablet Take 81 mg by mouth daily.        . calcium-vitamin D (OSCAL WITH D) 500-200 MG-UNIT per tablet Take 1 tablet by mouth daily.        . cetirizine (ZYRTEC) 10 MG tablet Take 10 mg by mouth daily.        . Cholecalciferol (VITAMIN D3) 2000 UNITS TABS Take by mouth.        . ezetimibe-simvastatin (VYTORIN) 10-40 MG per tablet Take 1 tablet by mouth at bedtime.        . Pseudoephedrine-Ibuprofen 30-200 MG CAPS Take by mouth daily.        . Wheat Dextrin (BENEFIBER PO) Take 1 tablet by mouth 2 (two) times daily.        Marland Kitchen DISCONTD: esomeprazole (NEXIUM) 40 MG capsule Take 40 mg by mouth daily before breakfast.        . DISCONTD: folic acid (FOLVITE) 1 MG tablet Take 1 mg by mouth daily.        Marland Kitchen  DISCONTD: levothyroxine (SYNTHROID, LEVOTHROID) 125 MCG tablet Take 125 mcg by mouth daily.        Marland Kitchen DISCONTD: triamterene-hydrochlorothiazide (MAXZIDE-25) 37.5-25 MG per tablet Take 1 tablet by mouth daily.         Current Facility-Administered Medications on File Prior to Visit  Medication Dose Route Frequency Provider Last Rate Last Dose  . DISCONTD: methylPREDNISolone acetate PF (DEPO-MEDROL) injection 40 mg  40 mg Intra-articular Once Carrie Mew, MD        BP 136/80  Pulse 72  Temp 98.1 F (36.7 C)  Resp 16  Ht 5' 6.5" (1.689 m)  Wt 166 lb (75.297 kg)  BMI 26.39 kg/m2       Objective:   Physical Exam  Nursing note and vitals reviewed. Constitutional: She is oriented to person, place, and time. She appears well-developed  and well-nourished. No distress.  HENT:  Head: Normocephalic and atraumatic.  Right Ear: External ear normal.  Left Ear: External ear normal.  Nose: Nose normal.  Mouth/Throat: Oropharynx is clear and moist.  Eyes: Conjunctivae and EOM are normal. Pupils are equal, round, and reactive to light.  Neck: Normal range of motion. Neck supple. No JVD present. No tracheal deviation present. No thyromegaly present.  Cardiovascular: Normal rate, regular rhythm, normal heart sounds and intact distal pulses.   No murmur heard. Pulmonary/Chest: Effort normal and breath sounds normal. She has no wheezes. She exhibits no tenderness.  Abdominal: Soft. Bowel sounds are normal.  Musculoskeletal: Normal range of motion. She exhibits no edema and no tenderness.  Lymphadenopathy:    She has no cervical adenopathy.  Neurological: She is alert and oriented to person, place, and time. She has normal reflexes. No cranial nerve deficit.  Skin: Skin is warm and dry. She is not diaphoretic.  Psychiatric: She has a normal mood and affect. Her behavior is normal.          Assessment & Plan:  We will monitor a lipid panel today and the liver for assessment of her hyperlipidemia control and medicine side effects  We will check a basic metabolic panel today to look at renal function and potassium her blood pressure is in excellent control her weight is stable.  For her rotator cuff we will make a referral to Dr. Caryn Bee supple for evaluation of rotator cuff syndrome  The patient has GERD and she is taking Nexium.  She has tried on several locations to change to generic parasol without success her symptoms return and have only resolved when she takes the Nexium.

## 2011-03-04 NOTE — Patient Instructions (Signed)
The patient is instructed to continue all medications as prescribed. Schedule followup with check out clerk upon leaving the clinic Refer to [facility] for rotator cuff

## 2011-03-10 ENCOUNTER — Encounter: Payer: Self-pay | Admitting: Internal Medicine

## 2011-03-14 NOTE — Progress Notes (Signed)
Pt informed and she is taking calcium and vitamin d daily--a pamphlet about prolia was mailed to pt and she will let us k now if she is interested

## 2011-03-21 ENCOUNTER — Telehealth: Payer: Self-pay | Admitting: Family Medicine

## 2011-03-21 NOTE — Telephone Encounter (Signed)
Left message on machine to return call. 

## 2011-03-21 NOTE — Telephone Encounter (Signed)
Pulled from triage vmail. Pr called last Fri and wanted you to call her about bone density. Calling back, still wanting you to call. Thanks!

## 2011-03-22 NOTE — Telephone Encounter (Signed)
The choice is to take prolia or reclast injection for worsen ing bone density with an increased fracture risk

## 2011-03-22 NOTE — Telephone Encounter (Signed)
Pt called back with questions about prolia.  Concerned about side effects and how bad is her bone density?  She also questions taking nexium if her bone density is bad .  Should she take another PPI?  Will make ov to discuss. Next ov is in May.

## 2011-03-22 NOTE — Telephone Encounter (Signed)
Discussed with pt and she insists on ov with dr Lovell Sheehan to discuss

## 2011-03-22 NOTE — Telephone Encounter (Signed)
May change to protonix 40

## 2011-03-28 ENCOUNTER — Encounter: Payer: Self-pay | Admitting: Internal Medicine

## 2011-03-28 ENCOUNTER — Ambulatory Visit (INDEPENDENT_AMBULATORY_CARE_PROVIDER_SITE_OTHER): Payer: Medicare Other | Admitting: Internal Medicine

## 2011-03-28 ENCOUNTER — Ambulatory Visit (INDEPENDENT_AMBULATORY_CARE_PROVIDER_SITE_OTHER)
Admission: RE | Admit: 2011-03-28 | Discharge: 2011-03-28 | Disposition: A | Discharge status: Home / Self Care | Payer: Medicare Other | Source: Ambulatory Visit | Attending: Internal Medicine | Admitting: Internal Medicine

## 2011-03-28 DIAGNOSIS — S161XXA Strain of muscle, fascia and tendon at neck level, initial encounter: Secondary | ICD-10-CM

## 2011-03-28 DIAGNOSIS — K209 Esophagitis, unspecified without bleeding: Secondary | ICD-10-CM

## 2011-03-28 DIAGNOSIS — S139XXA Sprain of joints and ligaments of unspecified parts of neck, initial encounter: Secondary | ICD-10-CM

## 2011-03-28 DIAGNOSIS — K219 Gastro-esophageal reflux disease without esophagitis: Secondary | ICD-10-CM

## 2011-03-28 MED ORDER — TIZANIDINE HCL 2 MG PO CAPS: 2.0000 mg | ORAL_CAPSULE | Freq: Three times a day (TID) | ORAL | Status: DC

## 2011-03-28 MED ORDER — PANTOPRAZOLE SODIUM 40 MG PO TBEC: 40.0000 mg | DELAYED_RELEASE_TABLET | Freq: Two times a day (BID) | ORAL | Status: DC

## 2011-03-28 NOTE — Patient Instructions (Addendum)
The patient is instructed to continue all medications as prescribed. Schedule followup with check out clerk upon leaving the clinic Cervical Strain A cervical strain is when the muscles or ligaments in the neck have been stretched. HOME CARE   Applying ice.   Put ice on the injured area.   Put ice in a plastic bag.   Place a towel between your skin and the bag.   Leave the ice on for 15 to 20 minutes, 3 to 4 times a day.  GET HELP RIGHT AWAY IF:   You have problems swallowing.   You have trouble breathing.   You feel numb.   You have weakness or problems moving your arms or legs.   The pain is increasing and does not get better with medicine.  MAKE SURE YOU:   Understand these instructions.   Will watch this condition.  Document Released: 07/06/2007 Document Revised: 09/29/2010 Document Reviewed: 07/06/2007 [facility] Patient Information 2012 [place], [place].

## 2011-03-28 NOTE — Progress Notes (Signed)
Subjective:    Patient ID: Amy Lin, female    DOB: 1933-12-28, 78 y.o.   MRN: [id number]  HPI The patient presents to discuss treatment for osteoporosis.  On her recent bone density it showed worsening osteopenia at the border of osteoporosis in both her arm and her hip.  She had a recent fall in which she hit her kneecap and had a fracture of the kneecap she did not break any other bones at that time but it came to light that her bone density had not been done in a few years and we repeated her bone density which gave [place] the data showing worsening bone density in her hip and in her leg.  We discussed in detail the different modalities of treating her bone density and since she had been on Fosamax for over 5 years we looked at injectables and oral Evista   Review of Systems  Constitutional: Negative for activity change, appetite change and fatigue.  HENT: Negative for ear pain, congestion, neck pain, postnasal drip and sinus pressure.   Eyes: Negative for redness and visual disturbance.  Respiratory: Negative for cough, shortness of breath and wheezing.   Gastrointestinal: Negative for abdominal pain and abdominal distention.  Genitourinary: Negative for dysuria, frequency and menstrual problem.  Musculoskeletal: Negative for myalgias, joint swelling and arthralgias.  Skin: Negative for rash and wound.  Neurological: Negative for dizziness, weakness and headaches.  Hematological: Negative for adenopathy. Does not bruise/bleed easily.  Psychiatric/Behavioral: Negative for sleep disturbance and decreased concentration.   Past Medical History  Diagnosis Date  . Osteoporosis   . Hyperlipidemia   . GERD (gastroesophageal reflux disease)   . Hypertension   . Thyroid disease   . Allergy     History   Social History  . Marital Status: Divorced    Spouse Name: N/A    Number of Children: N/A  . Years of Education: N/A   Occupational History  . Not on file.   Social History  Main Topics  . Smoking status: Never Smoker   . Smokeless tobacco: Never Used  . Alcohol Use: No  . Drug Use: No  . Sexually Active: Yes   Other Topics Concern  . Not on file   Social History Narrative  . No narrative on file    Past Surgical History  Procedure Date  . Abdominal hysterectomy     Family History  Problem Relation Age of Onset  . Heart disease Mother   . Stroke Father   . Hypertension Father   . Heart disease Father   . Diabetes Father     No Known Allergies  Current Outpatient Prescriptions on File Prior to Visit  Medication Sig Dispense Refill  . aspirin 81 MG EC tablet Take 81 mg by mouth daily.        . calcium-vitamin D (OSCAL WITH D) 500-200 MG-UNIT per tablet Take 1 tablet by mouth daily.        . cetirizine (ZYRTEC) 10 MG tablet Take 10 mg by mouth daily.        . Cholecalciferol (VITAMIN D3) 2000 UNITS TABS Take by mouth.        . diclofenac sodium (VOLTAREN) 1 % GEL Apply 1 application topically 4 (four) times daily.  100 g  4  . esomeprazole (NEXIUM) 40 MG capsule Take 1 capsule (40 mg total) by mouth daily before breakfast.  90 capsule  3  . ezetimibe-simvastatin (VYTORIN) 10-40 MG per tablet Take 1 tablet by  mouth at bedtime.        . folic acid (FOLVITE) 1 MG tablet Take 1 tablet (1 mg total) by mouth daily.  90 tablet  3  . levothyroxine (SYNTHROID, LEVOTHROID) 125 MCG tablet Take 1 tablet (125 mcg total) by mouth daily.  90 tablet  3  . Pseudoephedrine-Ibuprofen 30-200 MG CAPS Take by mouth daily.        Marland Kitchen triamterene-hydrochlorothiazide (MAXZIDE-25) 37.5-25 MG per tablet Take 1 each (1 tablet total) by mouth daily.  90 tablet  3  . Wheat Dextrin (BENEFIBER PO) Take 1 tablet by mouth 2 (two) times daily.          BP 150/80  Pulse 76  Temp 98.2 F (36.8 C)  Resp 16  Ht 5' 6.5" (1.689 m)  Wt 168 lb (76.204 kg)  BMI 26.71 kg/m2       Objective:   Physical Exam  Constitutional: She is oriented to person, place, and time. She  appears well-developed and well-nourished. No distress.  HENT:  Head: Normocephalic and atraumatic.  Right Ear: External ear normal.  Left Ear: External ear normal.  Nose: Nose normal.  Mouth/Throat: Oropharynx is clear and moist.  Eyes: Conjunctivae and EOM are normal. Pupils are equal, round, and reactive to light.  Neck: Normal range of motion. Neck supple. No JVD present. No tracheal deviation present. No thyromegaly present.  Cardiovascular: Normal rate, regular rhythm, normal heart sounds and intact distal pulses.   No murmur heard. Pulmonary/Chest: Effort normal and breath sounds normal. She has no wheezes. She exhibits no tenderness.  Abdominal: Soft. Bowel sounds are normal.  Musculoskeletal: Normal range of motion. She exhibits no edema and no tenderness.  Lymphadenopathy:    She has no cervical adenopathy.  Neurological: She is alert and oriented to person, place, and time. She has normal reflexes. No cranial nerve deficit.  Skin: Skin is warm and dry. She is not diaphoretic.  Psychiatric: She has a normal mood and affect. Her behavior is normal.          Assessment & Plan:  Our initial recommendation is to do a trial of this to her tolerance if she tolerates this medication we will repeat the bone density after one year if she does not tolerate Evista . second recommendation would be for reclast.  The patient has seen Dr Rennis Chris and was treated for bursitis. She also has cervical pain Trial of muslce relaxer and exercises  GERD has worsened with esophagitis Will change to Protonix 40 mg by mouth twice a day

## 2011-04-04 ENCOUNTER — Other Ambulatory Visit: Payer: Self-pay | Admitting: *Deleted

## 2011-04-04 DIAGNOSIS — M47812 Spondylosis without myelopathy or radiculopathy, cervical region: Secondary | ICD-10-CM

## 2011-05-04 ENCOUNTER — Telehealth: Payer: Self-pay

## 2011-05-04 NOTE — Telephone Encounter (Signed)
Pt states her medication was changed from Nexium to Protonix  Pt states she has had heartburn every day since she has been prescribed protonix.  Pt states she will go back to Nexium.  Pt has also been going for therapy for her neck twice a week for 4 weeks.  Pt states at first she was seeing improvement but now pt states she is in pain.  Pt states she is doing all the possible recommendations that therapy has given her but she is still in pain.  Pls advise on any recommendations.

## 2011-05-05 ENCOUNTER — Telehealth: Payer: Self-pay | Admitting: *Deleted

## 2011-05-05 DIAGNOSIS — M5090 Cervical disc disorder, unspecified, unspecified cervical region: Secondary | ICD-10-CM

## 2011-05-05 NOTE — Telephone Encounter (Signed)
Referral has been sent to [facility]=- can you call pt?

## 2011-05-05 NOTE — Telephone Encounter (Signed)
To dr pool for neck pain

## 2011-05-05 NOTE — Telephone Encounter (Signed)
Referral sent to [facility] an d pt informed

## 2011-05-05 NOTE — Telephone Encounter (Signed)
See spine md at [facility] orthopedic

## 2011-05-05 NOTE — Telephone Encounter (Signed)
Pt informed and referral sent to [facility] 

## 2011-06-01 ENCOUNTER — Encounter: Payer: Self-pay | Admitting: Internal Medicine

## 2011-06-01 ENCOUNTER — Ambulatory Visit (INDEPENDENT_AMBULATORY_CARE_PROVIDER_SITE_OTHER): Payer: Medicare Other | Admitting: Internal Medicine

## 2011-06-01 VITALS — BP 142/72 | HR 72 | Temp 98.6°F | Resp 16 | Ht 65.0 in | Wt 166.0 lb

## 2011-06-01 DIAGNOSIS — M25519 Pain in unspecified shoulder: Secondary | ICD-10-CM

## 2011-06-01 DIAGNOSIS — M533 Sacrococcygeal disorders, not elsewhere classified: Secondary | ICD-10-CM

## 2011-06-01 DIAGNOSIS — S161XXA Strain of muscle, fascia and tendon at neck level, initial encounter: Secondary | ICD-10-CM

## 2011-06-01 DIAGNOSIS — S139XXA Sprain of joints and ligaments of unspecified parts of neck, initial encounter: Secondary | ICD-10-CM

## 2011-06-01 NOTE — Patient Instructions (Addendum)
    The patient is instructed to continue all medications as prescribed. Schedule followup with check out clerk upon leaving the clinic   Consider magnesium oxide 200-400 mgs  A day

## 2011-06-01 NOTE — Progress Notes (Signed)
Subjective:    Patient ID: Amy Lin, female    DOB: 15-Jan-1934, 78 y.o.   MRN: [id number]  HPI This is a 78 year old female who is followed for hypertension hyperlipidemia history of gastroesophageal reflux she presents today to talk about her cervical disc pain and the referral she had to do a cervical disc specialist in orthopedics.  She was treated for her acute pain and she states that it it has improved by about 70-75%. She initially went to physical therapy and took a course of steroids and muscle relaxants.  Initially the physical therapy seemed to help but after which the physical therapy was not as effective.  The pain has improved and at the specialist I suggested an MRI and the possibility of doing pain injections.  Shoulder pain improved Patient has a new complaint of sciatica in her right leg.  The presentation primarily is with pain in the medial aspects of the thigh radiating to the knee and there is associated muscle jerking.  The differential diagnosis includes a form of restless legs.  I would suggest that this is secondary to her spinal arthritis and we can treat it with a similar plan      Review of Systems  Constitutional: Negative for activity change, appetite change and fatigue.  HENT: Negative for ear pain, congestion, neck pain, postnasal drip and sinus pressure.   Eyes: Negative for redness and visual disturbance.  Respiratory: Negative for cough, shortness of breath and wheezing.   Gastrointestinal: Negative for abdominal pain and abdominal distention.  Genitourinary: Negative for dysuria, frequency and menstrual problem.  Musculoskeletal: Negative for myalgias, joint swelling and arthralgias.  Skin: Negative for rash and wound.  Neurological: Negative for dizziness, weakness and headaches.  Hematological: Negative for adenopathy. Does not bruise/bleed easily.  Psychiatric/Behavioral: Negative for sleep disturbance and decreased concentration.    Past Medical History  Diagnosis Date  . Osteoporosis   . Hyperlipidemia   . GERD (gastroesophageal reflux disease)   . Hypertension   . Thyroid disease   . Allergy     History   Social History  . Marital Status: Divorced    Spouse Name: N/A    Number of Children: N/A  . Years of Education: N/A   Occupational History  . Not on file.   Social History Main Topics  . Smoking status: Never Smoker   . Smokeless tobacco: Never Used  . Alcohol Use: No  . Drug Use: No  . Sexually Active: Yes   Other Topics Concern  . Not on file   Social History Narrative  . No narrative on file    Past Surgical History  Procedure Date  . Abdominal hysterectomy     Family History  Problem Relation Age of Onset  . Heart disease Mother   . Stroke Father   . Hypertension Father   . Heart disease Father   . Diabetes Father     No Known Allergies  Current Outpatient Prescriptions on File Prior to Visit  Medication Sig Dispense Refill  . aspirin 81 MG EC tablet Take 81 mg by mouth daily.        . calcium-vitamin D (OSCAL WITH D) 500-200 MG-UNIT per tablet Take 1 tablet by mouth daily.        . cetirizine (ZYRTEC) 10 MG tablet Take 10 mg by mouth daily.        . Cholecalciferol (VITAMIN D3) 2000 UNITS TABS Take by mouth.        Marland Kitchen  diclofenac sodium (VOLTAREN) 1 % GEL Apply 1 application topically 4 (four) times daily.  100 g  4  . ezetimibe-simvastatin (VYTORIN) 10-40 MG per tablet Take 1 tablet by mouth at bedtime.        . folic acid (FOLVITE) 1 MG tablet Take 1 tablet (1 mg total) by mouth daily.  90 tablet  3  . levothyroxine (SYNTHROID, LEVOTHROID) 125 MCG tablet Take 1 tablet (125 mcg total) by mouth daily.  90 tablet  3  . Pseudoephedrine-Ibuprofen 30-200 MG CAPS Take by mouth daily.        . tizanidine (ZANAFLEX) 2 MG capsule Take 1 capsule (2 mg total) by mouth 3 (three) times daily.  60 capsule  1  . triamterene-hydrochlorothiazide (MAXZIDE-25) 37.5-25 MG per tablet Take 1  each (1 tablet total) by mouth daily.  90 tablet  3  . Wheat Dextrin (BENEFIBER PO) Take 1 tablet by mouth 2 (two) times daily.          BP 142/72  Pulse 72  Temp 98.6 F (37 C)  Resp 16  Ht 5\' 5"  (1.651 m)  Wt 166 lb (75.297 kg)  BMI 27.62 kg/m2       Objective:   Physical Exam  Nursing note and vitals reviewed. Constitutional: She is oriented to person, place, and time. She appears well-developed and well-nourished. No distress.  HENT:  Head: Normocephalic and atraumatic.  Right Ear: External ear normal.  Left Ear: External ear normal.  Nose: Nose normal.  Mouth/Throat: Oropharynx is clear and moist.  Eyes: Conjunctivae and EOM are normal. Pupils are equal, round, and reactive to light.  Neck: Normal range of motion. Neck supple. No JVD present. No tracheal deviation present. No thyromegaly present.  Cardiovascular: Normal rate, regular rhythm, normal heart sounds and intact distal pulses.   No murmur heard. Pulmonary/Chest: Effort normal and breath sounds normal. She has no wheezes. She exhibits no tenderness.  Abdominal: Soft. Bowel sounds are normal.  Musculoskeletal: Normal range of motion. She exhibits no edema and no tenderness.  Lymphadenopathy:    She has no cervical adenopathy.  Neurological: She is alert and oriented to person, place, and time. She has normal reflexes. No cranial nerve deficit.  Skin: Skin is warm and dry. She is not diaphoretic.  Psychiatric: She has a normal mood and affect. Her behavior is normal.          Assessment & Plan:  Patient has improved significantly from her original prep presentation and I think it is safe for [place] to continue with conservative treatment at this time she is doing some of the physical therapy exercises she was taught we will use a mild muscle relaxants at bedtime to keep the muscles lumbar so that she doesn't tighten up I recommended that she consider massage or other alternative therapies to keep her neck muscles  relaxed and we will continue watchful observation of her condition if the pain returns then we will proceed with the MRI and would try the epidural injection to see if that's effective before going back to the spine specialist to consider surgical options

## 2011-07-08 ENCOUNTER — Telehealth: Payer: Self-pay | Admitting: Internal Medicine

## 2011-07-08 NOTE — Telephone Encounter (Signed)
Talked with- has not had blood pressure taken- states they are swollen in am and at night--has ov with dr Lovell Sheehan on 6-28-this has been going on for several months

## 2011-07-08 NOTE — Telephone Encounter (Signed)
Pt is experiencing leg and feet swelling. Pt decline to see another provider

## 2011-07-08 NOTE — Telephone Encounter (Signed)
Per dr Consuela Mimes bp-cut back salt- no salt or salt products- elevate feet as much as possible and call next week with report- if continues to swell will need to see pt before 6-28 appointment

## 2011-07-13 ENCOUNTER — Ambulatory Visit: Payer: Medicare Other | Admitting: Internal Medicine

## 2011-07-19 ENCOUNTER — Other Ambulatory Visit: Payer: Self-pay | Admitting: *Deleted

## 2011-07-19 MED ORDER — TRIAMTERENE-HCTZ 37.5-25 MG PO TABS: 1.0000 | ORAL_TABLET | Freq: Every day | ORAL | Status: DC

## 2011-07-20 ENCOUNTER — Telehealth: Payer: Self-pay | Admitting: Internal Medicine

## 2011-07-20 MED ORDER — LEVOTHYROXINE SODIUM 125 MCG PO TABS: 125.0000 ug | ORAL_TABLET | Freq: Every day | ORAL | Status: DC

## 2011-07-20 NOTE — Telephone Encounter (Signed)
Calling for refill of Levothyroxine 125 mcg, one daily, #90,  from 03/04/11, [facility] mail order has been filling this RX with generic form, this call was due to the pt requesting generic form.

## 2011-07-20 NOTE — Telephone Encounter (Signed)
done

## 2011-07-29 ENCOUNTER — Ambulatory Visit (INDEPENDENT_AMBULATORY_CARE_PROVIDER_SITE_OTHER): Payer: Medicare Other | Admitting: Internal Medicine

## 2011-07-29 VITALS — BP 126/78 | HR 72 | Temp 98.2°F | Resp 16 | Ht 66.5 in | Wt 168.0 lb

## 2011-07-29 DIAGNOSIS — R5381 Other malaise: Secondary | ICD-10-CM

## 2011-07-29 DIAGNOSIS — R609 Edema, unspecified: Secondary | ICD-10-CM

## 2011-07-29 DIAGNOSIS — R600 Localized edema: Secondary | ICD-10-CM

## 2011-07-29 DIAGNOSIS — R531 Weakness: Secondary | ICD-10-CM

## 2011-07-29 DIAGNOSIS — T887XXA Unspecified adverse effect of drug or medicament, initial encounter: Secondary | ICD-10-CM

## 2011-07-29 DIAGNOSIS — R5383 Other fatigue: Secondary | ICD-10-CM

## 2011-07-29 LAB — BASIC METABOLIC PANEL
BUN: 16 mg/dL (ref 6–23)
Calcium: 9.8 mg/dL (ref 8.4–10.5)
GFR: 62.38 mL/min (ref >60.00)
Glucose, Bld: 95 mg/dL (ref 70–99)
Potassium: 4.6 mEq/L (ref 3.5–5.1)
Sodium: 140 mEq/L (ref 135–145)

## 2011-07-29 LAB — HEPATIC FUNCTION PANEL
ALT: 13 U/L (ref 0–35)
AST: 20 U/L (ref 0–37)
Alkaline Phosphatase: 66 U/L (ref 39–117)
Bilirubin, Direct: 0 mg/dL (ref 0.0–0.3)
Total Bilirubin: 0.5 mg/dL (ref 0.3–1.2)

## 2011-07-29 LAB — CBC WITH DIFFERENTIAL/PLATELET
Basophils Absolute: 0.1 10*3/uL (ref 0.0–0.1)
Eosinophils Relative: 1.5 % (ref 0.0–5.0)
HCT: 39.2 % (ref 36.0–46.0)
Lymphocytes Relative: 29.9 % (ref 12.0–46.0)
Monocytes Relative: 9.6 % (ref 3.0–12.0)
Platelets: 216 10*3/uL (ref 150.0–400.0)
RDW: 13.2 % (ref 11.5–14.6)
WBC: 7.1 10*3/uL (ref 4.5–10.5)

## 2011-07-29 MED ORDER — TRIAMTERENE-HCTZ 37.5-25 MG PO TABS: 2.0000 | ORAL_TABLET | Freq: Every day | ORAL | Status: DC

## 2011-07-29 NOTE — Progress Notes (Signed)
Subjective:    Patient ID: Amy Lin, female    DOB: 1933-08-09, 78 y.o.   MRN: [id number]  HPI Patient presents for followup of hypertension and hypothyroidism with a chief complaint of swelling in her feet.  She denies any shortness of breath PND orthopnea she has no chest pain she has a history of hypertension elevated cholesterol and hyperglycemia.  She carries a diagnosis of LVH but not on a congestive heart failure   Review of Systems  Constitutional: Negative for activity change, appetite change and fatigue.  HENT: Negative for ear pain, congestion, neck pain, postnasal drip and sinus pressure.   Eyes: Negative for redness and visual disturbance.  Respiratory: Negative for cough, shortness of breath and wheezing.   Gastrointestinal: Negative for abdominal pain and abdominal distention.  Genitourinary: Negative for dysuria, frequency and menstrual problem.  Musculoskeletal: Negative for myalgias, joint swelling and arthralgias.  Skin: Negative for rash and wound.  Neurological: Negative for dizziness, weakness and headaches.  Hematological: Negative for adenopathy. Does not bruise/bleed easily.  Psychiatric/Behavioral: Negative for disturbed wake/sleep cycle and decreased concentration.   Past Medical History  Diagnosis Date  . Osteoporosis   . Hyperlipidemia   . GERD (gastroesophageal reflux disease)   . Hypertension   . Thyroid disease   . Allergy     History   Social History  . Marital Status: Divorced    Spouse Name: N/A    Number of Children: N/A  . Years of Education: N/A   Occupational History  . Not on file.   Social History Main Topics  . Smoking status: Never Smoker   . Smokeless tobacco: Never Used  . Alcohol Use: No  . Drug Use: No  . Sexually Active: Yes   Other Topics Concern  . Not on file   Social History Narrative  . No narrative on file    Past Surgical History  Procedure Date  . Abdominal hysterectomy     Family History    Problem Relation Age of Onset  . Heart disease Mother   . Stroke Father   . Hypertension Father   . Heart disease Father   . Diabetes Father     No Known Allergies  Current Outpatient Prescriptions on File Prior to Visit  Medication Sig Dispense Refill  . aspirin 81 MG EC tablet Take 81 mg by mouth daily.        . calcium-vitamin D (OSCAL WITH D) 500-200 MG-UNIT per tablet Take 1 tablet by mouth daily.        . cetirizine (ZYRTEC) 10 MG tablet Take 10 mg by mouth daily.        . Cholecalciferol (VITAMIN D3) 2000 UNITS TABS Take by mouth.        . diclofenac sodium (VOLTAREN) 1 % GEL Apply 1 application topically 4 (four) times daily.  100 g  4  . esomeprazole (NEXIUM) 40 MG capsule Take 40 mg by mouth daily before breakfast.      . ezetimibe-simvastatin (VYTORIN) 10-40 MG per tablet Take 1 tablet by mouth at bedtime.        . folic acid (FOLVITE) 1 MG tablet Take 1 tablet (1 mg total) by mouth daily.  90 tablet  3  . levothyroxine (SYNTHROID, LEVOTHROID) 125 MCG tablet Take 1 tablet (125 mcg total) by mouth daily.  90 tablet  3  . Pseudoephedrine-Ibuprofen 30-200 MG CAPS Take by mouth daily.        . tizanidine (ZANAFLEX) 2 MG capsule  Take 1 capsule (2 mg total) by mouth 3 (three) times daily.  60 capsule  1  . triamterene-hydrochlorothiazide (MAXZIDE-25) 37.5-25 MG per tablet Take 1 each (1 tablet total) by mouth daily.  30 tablet  1  . Wheat Dextrin (BENEFIBER PO) Take 1 tablet by mouth 2 (two) times daily.          BP 126/78  Pulse 72  Temp 98.2 F (36.8 C)  Resp 16  Ht 5' 6.5" (1.689 m)  Wt 168 lb (76.204 kg)  BMI 26.71 kg/m2       Objective:   Physical Exam  Constitutional: She is oriented to person, place, and time. She appears well-developed and well-nourished. No distress.  HENT:  Head: Normocephalic and atraumatic.  Right Ear: External ear normal.  Left Ear: External ear normal.  Nose: Nose normal.  Mouth/Throat: Oropharynx is clear and moist.  Eyes:  Conjunctivae and EOM are normal. Pupils are equal, round, and reactive to light.  Neck: Normal range of motion. Neck supple. No JVD present. No tracheal deviation present. No thyromegaly present.  Cardiovascular: Normal rate, regular rhythm, normal heart sounds and intact distal pulses.   No murmur heard. Pulmonary/Chest: Effort normal and breath sounds normal. She has no wheezes. She exhibits no tenderness.  Abdominal: Soft. Bowel sounds are normal.  Musculoskeletal: Normal range of motion. She exhibits no edema and no tenderness.  Lymphadenopathy:    She has no cervical adenopathy.  Neurological: She is alert and oriented to person, place, and time. She has normal reflexes. No cranial nerve deficit.  Skin: Skin is warm and dry. She is not diaphoretic.  Psychiatric: She has a normal mood and affect. Her behavior is normal.          Assessment & Plan:  Blood pressure control is adequate on her current regimen which includes a diuretic only.  She may benefit from an ACE inhibitor given her LVH.  2 stable lipids on Vytorin continue Vytorin.  She is a history of hypo-thyroidism on 125 mcg of Synthroid measured thyroid and adjust dose as appropriate Has a history of abnormal liver functions felt to be due to fatty liver but she is on a statin drug monitoring of her liver functions today has been ordered

## 2011-07-29 NOTE — Patient Instructions (Addendum)
The patient is instructed to continue all medications as prescribed. Schedule followup with check out clerk upon leaving the clinic  

## 2011-10-25 ENCOUNTER — Encounter: Payer: Self-pay | Admitting: Internal Medicine

## 2011-10-31 ENCOUNTER — Ambulatory Visit (INDEPENDENT_AMBULATORY_CARE_PROVIDER_SITE_OTHER): Payer: Medicare Other | Admitting: Internal Medicine

## 2011-10-31 ENCOUNTER — Encounter: Payer: Self-pay | Admitting: Internal Medicine

## 2011-10-31 VITALS — BP 138/78 | HR 76 | Temp 98.2°F | Resp 16 | Ht 66.5 in | Wt 160.0 lb

## 2011-10-31 DIAGNOSIS — F411 Generalized anxiety disorder: Secondary | ICD-10-CM

## 2011-10-31 DIAGNOSIS — E785 Hyperlipidemia, unspecified: Secondary | ICD-10-CM

## 2011-10-31 DIAGNOSIS — R4589 Other symptoms and signs involving emotional state: Secondary | ICD-10-CM

## 2011-10-31 DIAGNOSIS — Z23 Encounter for immunization: Secondary | ICD-10-CM

## 2011-10-31 DIAGNOSIS — K219 Gastro-esophageal reflux disease without esophagitis: Secondary | ICD-10-CM

## 2011-10-31 DIAGNOSIS — B351 Tinea unguium: Secondary | ICD-10-CM

## 2011-10-31 MED ORDER — ROSUVASTATIN CALCIUM 20 MG PO TABS: 20.0000 mg | ORAL_TABLET | ORAL | Status: DC

## 2011-10-31 NOTE — Progress Notes (Signed)
Subjective:    Patient ID: Amy Lin, female    DOB: 1933-10-23, 78 y.o.   MRN: [id number]  HPI Sister died last week  And she has increased stress from this loss  Review of Systems  Constitutional: Positive for fatigue. Negative for activity change and appetite change.  HENT: Positive for congestion. Negative for ear pain, neck pain, postnasal drip and sinus pressure.   Eyes: Negative for redness and visual disturbance.  Respiratory: Negative for cough, shortness of breath and wheezing.   Gastrointestinal: Negative for abdominal pain and abdominal distention.  Genitourinary: Negative for dysuria, frequency and menstrual problem.  Musculoskeletal: Negative for myalgias, joint swelling and arthralgias.  Skin: Negative for rash and wound.  Neurological: Negative for dizziness, weakness and headaches.  Hematological: Negative for adenopathy. Does not bruise/bleed easily.  Psychiatric/Behavioral: Positive for dysphoric mood and decreased concentration. Negative for disturbed wake/sleep cycle.       Past Medical History  Diagnosis Date  . Osteoporosis   . Hyperlipidemia   . GERD (gastroesophageal reflux disease)   . Hypertension   . Thyroid disease   . Allergy     History   Social History  . Marital Status: Divorced    Spouse Name: N/A    Number of Children: N/A  . Years of Education: N/A   Occupational History  . Not on file.   Social History Main Topics  . Smoking status: Never Smoker   . Smokeless tobacco: Never Used  . Alcohol Use: No  . Drug Use: No  . Sexually Active: Yes   Other Topics Concern  . Not on file   Social History Narrative  . No narrative on file    Past Surgical History  Procedure Date  . Abdominal hysterectomy     Family History  Problem Relation Age of Onset  . Heart disease Mother   . Stroke Father   . Hypertension Father   . Heart disease Father   . Diabetes Father     No Known Allergies  Current Outpatient Prescriptions  on File Prior to Visit  Medication Sig Dispense Refill  . aspirin 81 MG EC tablet Take 81 mg by mouth daily.        . calcium-vitamin D (OSCAL WITH D) 500-200 MG-UNIT per tablet Take 1 tablet by mouth daily.        . cetirizine (ZYRTEC) 10 MG tablet Take 10 mg by mouth daily.        . Cholecalciferol (VITAMIN D3) 2000 UNITS TABS Take by mouth.        . diclofenac sodium (VOLTAREN) 1 % GEL Apply 1 application topically 4 (four) times daily.  100 g  4  . esomeprazole (NEXIUM) 40 MG capsule Take 40 mg by mouth daily before breakfast.      . folic acid (FOLVITE) 1 MG tablet Take 1 tablet (1 mg total) by mouth daily.  90 tablet  3  . levothyroxine (SYNTHROID, LEVOTHROID) 125 MCG tablet Take 1 tablet (125 mcg total) by mouth daily.  90 tablet  3  . Pseudoephedrine-Ibuprofen 30-200 MG CAPS Take by mouth daily.        Marland Kitchen triamterene-hydrochlorothiazide (MAXZIDE-25) 37.5-25 MG per tablet Take 2 each (2 tablets total) by mouth daily.  60 tablet  11  . Wheat Dextrin (BENEFIBER PO) Take 1 tablet by mouth 2 (two) times daily.        Marland Kitchen ezetimibe-simvastatin (VYTORIN) 10-40 MG per tablet Take 1 tablet by mouth at bedtime.  BP 138/78  Pulse 76  Temp 98.2 F (36.8 C)  Resp 16  Ht 5' 6.5" (1.689 m)  Wt 160 lb (72.576 kg)  BMI 25.44 kg/m2    Objective:   Physical Exam  Nursing note and vitals reviewed. Constitutional: She is oriented to person, place, and time. She appears well-developed and well-nourished. No distress.  HENT:  Head: Normocephalic and atraumatic.  Right Ear: External ear normal.  Left Ear: External ear normal.  Nose: Nose normal.  Mouth/Throat: Oropharynx is clear and moist.  Eyes: Conjunctivae normal and EOM are normal. Pupils are equal, round, and reactive to light.  Neck: Normal range of motion. Neck supple. No JVD present. No tracheal deviation present. No thyromegaly present.  Cardiovascular: Normal rate, regular rhythm, normal heart sounds and intact distal pulses.     No murmur heard. Pulmonary/Chest: Effort normal and breath sounds normal. She has no wheezes. She exhibits no tenderness.  Abdominal: Soft. Bowel sounds are normal.  Musculoskeletal: Normal range of motion. She exhibits no edema and no tenderness.  Lymphadenopathy:    She has no cervical adenopathy.  Neurological: She is alert and oriented to person, place, and time. She has normal reflexes. No cranial nerve deficit.  Skin: Skin is warm and dry. She is not diaphoretic.  Psychiatric: She has a normal mood and affect. Her behavior is normal.          Assessment & Plan:  Discussion of risk factors and health Could not tolerate the vytorin so we are discussion of replacement   Pulse crestor 20 mg one a week GERD stable blood pressure stable  Magnesium  For restless legs

## 2011-10-31 NOTE — Patient Instructions (Signed)
Use one cup of white vinegar in about a gallon of warm water and soak feet for about 15 minutes 3 times a week

## 2011-11-01 ENCOUNTER — Other Ambulatory Visit: Payer: Self-pay | Admitting: *Deleted

## 2011-11-01 DIAGNOSIS — E785 Hyperlipidemia, unspecified: Secondary | ICD-10-CM

## 2011-11-01 MED ORDER — ROSUVASTATIN CALCIUM 20 MG PO TABS: 20.0000 mg | ORAL_TABLET | ORAL | Status: DC

## 2011-11-02 ENCOUNTER — Other Ambulatory Visit: Payer: Self-pay | Admitting: *Deleted

## 2011-11-02 DIAGNOSIS — E785 Hyperlipidemia, unspecified: Secondary | ICD-10-CM

## 2011-11-02 MED ORDER — ROSUVASTATIN CALCIUM 20 MG PO TABS: 20.0000 mg | ORAL_TABLET | ORAL | Status: DC

## 2011-11-11 ENCOUNTER — Ambulatory Visit (INDEPENDENT_AMBULATORY_CARE_PROVIDER_SITE_OTHER): Payer: Medicare Other | Admitting: Family Medicine

## 2011-11-11 ENCOUNTER — Encounter: Payer: Self-pay | Admitting: Family Medicine

## 2011-11-11 VITALS — BP 132/70 | HR 78 | Temp 99.1°F | Wt 162.0 lb

## 2011-11-11 DIAGNOSIS — J069 Acute upper respiratory infection, unspecified: Secondary | ICD-10-CM

## 2011-11-11 MED ORDER — BENZONATATE 100 MG PO CAPS: 100.0000 mg | ORAL_CAPSULE | Freq: Two times a day (BID) | ORAL | Status: DC | PRN

## 2011-11-11 MED ORDER — FLUTICASONE PROPIONATE 50 MCG/ACT NA SUSP: 2.0000 | Freq: Every day | NASAL | Status: DC

## 2011-11-11 NOTE — Patient Instructions (Addendum)
INSTRUCTIONS FOR UPPER RESPIRATORY INFECTION:  -plenty of rest and fluids  -nasal saline (use prepackaged nasal saline or bottled/distilled water if making your own)  -clean nose with nasal saline before using the nasal steroid or sinex  -can use sinex nasal spray for drainage and nasal congestion - but do NOT use longer then 3-4 days  -can use tylenol or ibuprofen as directed for aches and sorethroat  -use humidifier at night - follow cleaning instructions  -if you are taking a cough medication - use only as directed, can also try salt water gargles and a teaspoon of honey and lozenges  -follow up if fevers, worsening or not better in in 7 days

## 2011-11-11 NOTE — Progress Notes (Addendum)
Chief Complaint  Patient presents with  . Cough    headache, mucus- yellowish red since Wednesday     HPI: -started 2-3 days ago -symptoms:nasal congestion, sore throat, sinus pressure, R ear pain, drainage in throat, swollen glands in throat, cough, did gag on mucus this morning -denies:fever, SOB, NVD, tooth pain -has tried: takes zyrtec, has tried Advil cold and sinus -sick contacts: son has been sick too  ROS: See pertinent positives and negatives per HPI.  Past Medical History  Diagnosis Date  . Osteoporosis   . Hyperlipidemia   . GERD (gastroesophageal reflux disease)   . Hypertension   . Thyroid disease   . Allergy     Family History  Problem Relation Age of Onset  . Heart disease Mother   . Stroke Father   . Hypertension Father   . Heart disease Father   . Diabetes Father     History   Social History  . Marital Status: Divorced    Spouse Name: N/A    Number of Children: N/A  . Years of Education: N/A   Social History Main Topics  . Smoking status: Never Smoker   . Smokeless tobacco: Never Used  . Alcohol Use: No  . Drug Use: No  . Sexually Active: Yes   Other Topics Concern  . None   Social History Narrative  . None    Current outpatient prescriptions:aspirin 81 MG EC tablet, Take 81 mg by mouth daily.  , Disp: , Rfl: ;  calcium-vitamin D (OSCAL WITH D) 500-200 MG-UNIT per tablet, Take 1 tablet by mouth daily.  , Disp: , Rfl: ;  cetirizine (ZYRTEC) 10 MG tablet, Take 10 mg by mouth daily.  , Disp: , Rfl: ;  Cholecalciferol (VITAMIN D3) 2000 UNITS TABS, Take by mouth.  , Disp: , Rfl: ;  Cyanocobalamin (VITAMIN B 12 PO), Take by mouth daily., Disp: , Rfl:  diclofenac sodium (VOLTAREN) 1 % GEL, Apply 1 application topically 4 (four) times daily., Disp: 100 g, Rfl: 4;  esomeprazole (NEXIUM) 40 MG capsule, Take 40 mg by mouth daily before breakfast., Disp: , Rfl: ;  folic acid (FOLVITE) 1 MG tablet, Take 1 tablet (1 mg total) by mouth daily., Disp: 90  tablet, Rfl: 3;  levothyroxine (SYNTHROID, LEVOTHROID) 125 MCG tablet, Take 1 tablet (125 mcg total) by mouth daily., Disp: 90 tablet, Rfl: 3 Magnesium 250 MG TABS, Take 1 tablet by mouth daily., Disp: , Rfl: ;  Pseudoephedrine-Ibuprofen 30-200 MG CAPS, Take by mouth daily.  , Disp: , Rfl: ;  rosuvastatin (CRESTOR) 20 MG tablet, Take 1 tablet (20 mg total) by mouth once a week., Disp: 15 tablet, Rfl: 3;  terbinafine (LAMISIL) 250 MG tablet, Take 250 mg by mouth daily., Disp: , Rfl:  triamterene-hydrochlorothiazide (MAXZIDE-25) 37.5-25 MG per tablet, Take 2 each (2 tablets total) by mouth daily., Disp: 60 tablet, Rfl: 11;  Wheat Dextrin (BENEFIBER PO), Take 1 tablet by mouth 2 (two) times daily.  , Disp: , Rfl: ;  benzonatate (TESSALON) 100 MG capsule, Take 1 capsule (100 mg total) by mouth 2 (two) times daily as needed for cough., Disp: 20 capsule, Rfl: 0 fluticasone (FLONASE) 50 MCG/ACT nasal spray, Place 2 sprays into the nose daily., Disp: 16 g, Rfl: 0  EXAM:  Filed Vitals:   11/11/11 1004  BP: 132/70  Pulse: 78  Temp: 99.1 F (37.3 C)    There is no height on file to calculate BMI.  GENERAL: vitals reviewed and listed above,  alert, oriented, appears well hydrated and in no acute distress  HEENT: atraumatic, conjunttiva clear, no obvious abnormalities on inspection of external nose and ears, ear canals and TMs normal, nasal congestion, PND and post oropharyngeal erythema  NECK: no obvious masses on inspection, no LAD  LUNGS: clear to auscultation bilaterally, no wheezes, rales or rhonchi, good air movement  CV: HRRR, no peripheral edema  MS: moves all extremities without noticeable abnormality  PSYCH: pleasant and cooperative, no obvious depression or anxiety  ASSESSMENT AND PLAN:  Discussed the following assessment and plan:  1. Viral upper respiratory infection   -instructions per below Flonase and tessalon Perles provided (pt did not want sedating cough medication)    -Patient advised to return or notify a doctor immediately if symptoms worsen or persist or new concerns arise.  Patient Instructions  INSTRUCTIONS FOR UPPER RESPIRATORY INFECTION:  -plenty of rest and fluids  -nasal saline (use prepackaged nasal saline or bottled/distilled water if making your own)  -clean nose with nasal saline before using the nasal steroid or sinex  -can use sinex nasal spray for drainage and nasal congestion - but do NOT use longer then 3-4 days  -can use tylenol or ibuprofen as directed for aches and sorethroat  -use humidifier at night - follow cleaning instructions  -if you are taking a cough medication - use only as directed, can also try salt water gargles and a teaspoon of honey and lozenges  -follow up if fevers, worsening or not better in in 7 days       KIM, HANNAH R.

## 2011-11-18 ENCOUNTER — Telehealth: Payer: Self-pay | Admitting: Internal Medicine

## 2011-11-18 NOTE — Telephone Encounter (Signed)
Caller: [name]/Patient; Patient Name: Amy Lin; PCP: Darryll Capers (Adults only); Best Callback Phone Number: [phone]; Reason for call: Seen in office on 11/11/11 and dx with Cold Virus. She is still having sx of headache and frequent productive cough  productive for greenish sputum. She has been using Sinex Nose Spray for 4 days, Flonase, and Saline nose rinse.  She is taking Advil for headache and Benzontate for Cough. Symptoms not improved. Coughing to the point of vomiting. Triage and Care advice per Upper Respiratory Infection Protocol and appointment advised within 24 hours. Scheduled at [facility] @ 1045 on 11/19/11.

## 2011-11-19 ENCOUNTER — Ambulatory Visit (INDEPENDENT_AMBULATORY_CARE_PROVIDER_SITE_OTHER): Payer: Medicare Other | Admitting: Family Medicine

## 2011-11-19 ENCOUNTER — Encounter: Payer: Self-pay | Admitting: Family Medicine

## 2011-11-19 VITALS — BP 142/80 | HR 78 | Temp 98.3°F | Wt 160.0 lb

## 2011-11-19 DIAGNOSIS — J209 Acute bronchitis, unspecified: Secondary | ICD-10-CM

## 2011-11-19 MED ORDER — AZITHROMYCIN 250 MG PO TABS: ORAL_TABLET | ORAL | Status: DC

## 2011-11-19 NOTE — Patient Instructions (Addendum)
Robitussin  DM for cough during the day or tessalon perles. Start antibiotics.  Call if shortness of breath, wheeze or if not turning the corner in 5-7 days.

## 2011-11-19 NOTE — Assessment & Plan Note (Signed)
Given symptoms greater than 10 days.. Likely bactrerial infeciton on viral infection. Treat with Z-pak. Guafenesin, cough supressant.

## 2011-11-19 NOTE — Progress Notes (Signed)
  Subjective:    Patient ID: Amy Lin, female    DOB: 1933/05/23, 78 y.o.   MRN: [id number]  Cough This is a new problem. The current episode started 1 to 4 weeks ago. The problem has been gradually worsening. The problem occurs constantly. The cough is productive of sputum (yellow brown). Associated symptoms include ear pain, a fever, headaches and nasal congestion. Pertinent negatives include no sore throat or shortness of breath. Associated symptoms comments: Right ear pain. She has tried OTC cough suppressant (See below) for the symptoms. The treatment provided mild relief. There is no history of asthma, COPD or emphysema. nonsmoker  Headache  Associated symptoms include coughing, ear pain and a fever. Pertinent negatives include no sore throat. (Nonsmoker)   Saw Dr. Selena Batten  1 week ago.. Diagnosed with viral syndrome. Given tessalon perles, flonase, sinex nasal saline.   Review of Systems  Constitutional: Positive for fever.  HENT: Positive for ear pain. Negative for sore throat.   Respiratory: Positive for cough. Negative for shortness of breath.   Neurological: Positive for headaches.       Objective:   Physical Exam  Constitutional: Vital signs are normal. She appears well-developed and well-nourished. She is cooperative.  Non-toxic appearance. She does not appear ill. No distress.       Constant cough  HENT:  Head: Normocephalic.  Right Ear: Hearing, tympanic membrane, external ear and ear canal normal. Tympanic membrane is not erythematous, not retracted and not bulging.  Left Ear: Hearing, tympanic membrane, external ear and ear canal normal. Tympanic membrane is not erythematous, not retracted and not bulging.  Nose: Mucosal edema and rhinorrhea present. Right sinus exhibits no maxillary sinus tenderness and no frontal sinus tenderness. Left sinus exhibits no maxillary sinus tenderness and no frontal sinus tenderness.  Mouth/Throat: Uvula is midline, oropharynx is clear and  moist and mucous membranes are normal.  Eyes: Conjunctivae normal, EOM and lids are normal. Pupils are equal, round, and reactive to light. No foreign bodies found.  Neck: Trachea normal and normal range of motion. Neck supple. Carotid bruit is not present. No mass and no thyromegaly present.  Cardiovascular: Normal rate, regular rhythm, S1 normal, S2 normal, normal heart sounds, intact distal pulses and normal pulses.  Exam reveals no gallop and no friction rub.   No murmur heard. Pulmonary/Chest: Effort normal and breath sounds normal. Not tachypneic. No respiratory distress. She has no decreased breath sounds. She has no wheezes. She has no rhonchi. She has no rales. She exhibits no tenderness.  Neurological: She is alert.  Skin: Skin is warm, dry and intact. No rash noted.  Psychiatric: Her speech is normal and behavior is normal. Judgment normal. Her mood appears not anxious. Cognition and memory are normal. She does not exhibit a depressed mood.          Assessment & Plan:

## 2012-01-13 ENCOUNTER — Other Ambulatory Visit (INDEPENDENT_AMBULATORY_CARE_PROVIDER_SITE_OTHER): Payer: Medicare Other

## 2012-01-13 DIAGNOSIS — E785 Hyperlipidemia, unspecified: Secondary | ICD-10-CM

## 2012-01-13 DIAGNOSIS — K219 Gastro-esophageal reflux disease without esophagitis: Secondary | ICD-10-CM

## 2012-01-13 LAB — LIPID PANEL
Cholesterol: 328 mg/dL — ABNORMAL HIGH (ref 0–200)
Total CHOL/HDL Ratio: 7
VLDL: 26.6 mg/dL (ref 0.0–40.0)

## 2012-01-13 LAB — HEPATIC FUNCTION PANEL
AST: 16 U/L (ref 0–37)
Albumin: 4.3 g/dL (ref 3.5–5.2)
Alkaline Phosphatase: 71 U/L (ref 39–117)
Bilirubin, Direct: 0 mg/dL (ref 0.0–0.3)
Total Bilirubin: 0.8 mg/dL (ref 0.3–1.2)

## 2012-01-13 LAB — BASIC METABOLIC PANEL
CO2: 30 mEq/L (ref 19–32)
Chloride: 100 mEq/L (ref 96–112)
Creatinine, Ser: 1 mg/dL (ref 0.4–1.2)
Glucose, Bld: 105 mg/dL — ABNORMAL HIGH (ref 70–99)

## 2012-01-16 ENCOUNTER — Ambulatory Visit (INDEPENDENT_AMBULATORY_CARE_PROVIDER_SITE_OTHER): Payer: Medicare Other | Admitting: Internal Medicine

## 2012-01-16 ENCOUNTER — Encounter: Payer: Self-pay | Admitting: Internal Medicine

## 2012-01-16 VITALS — BP 136/80 | HR 72 | Temp 98.3°F | Resp 16 | Ht 66.5 in | Wt 156.0 lb

## 2012-01-16 DIAGNOSIS — E785 Hyperlipidemia, unspecified: Secondary | ICD-10-CM

## 2012-01-16 DIAGNOSIS — M8080XA Other osteoporosis with current pathological fracture, unspecified site, initial encounter for fracture: Secondary | ICD-10-CM

## 2012-01-16 DIAGNOSIS — M8440XA Pathological fracture, unspecified site, initial encounter for fracture: Secondary | ICD-10-CM

## 2012-01-16 DIAGNOSIS — M1710 Unilateral primary osteoarthritis, unspecified knee: Secondary | ICD-10-CM

## 2012-01-16 DIAGNOSIS — M810 Age-related osteoporosis without current pathological fracture: Secondary | ICD-10-CM

## 2012-01-16 MED ORDER — METHYLPREDNISOLONE ACETATE 40 MG/ML IJ SUSP: 40.0000 mg | Freq: Once | INTRAMUSCULAR | Status: DC

## 2012-01-16 NOTE — Progress Notes (Signed)
  Subjective:    Patient ID: Amy Lin, female    DOB: [birth year], 78 y.o.   MRN: [id number]  HPI The patient has not been taking the medications for lipids   Review of Systems  Constitutional: Negative for activity change, appetite change and fatigue.  HENT: Negative for ear pain, congestion, neck pain, postnasal drip and sinus pressure.   Eyes: Negative for redness and visual disturbance.  Respiratory: Negative for cough, shortness of breath and wheezing.   Gastrointestinal: Negative for abdominal pain and abdominal distention.  Genitourinary: Negative for dysuria, frequency and menstrual problem.  Musculoskeletal: Negative for myalgias, joint swelling and arthralgias.  Skin: Negative for rash and wound.  Neurological: Negative for dizziness, weakness and headaches.  Hematological: Negative for adenopathy. Does not bruise/bleed easily.  Psychiatric/Behavioral: Negative for sleep disturbance and decreased concentration.       Objective:   Physical Exam  Nursing note and vitals reviewed. Constitutional: She is oriented to person, place, and time. She appears well-developed and well-nourished. No distress.  HENT:  Head: Normocephalic and atraumatic.  Right Ear: External ear normal.  Left Ear: External ear normal.  Nose: Nose normal.  Mouth/Throat: Oropharynx is clear and moist.  Eyes: Conjunctivae normal and EOM are normal. Pupils are equal, round, and reactive to light.  Neck: Normal range of motion. Neck supple. No JVD present. No tracheal deviation present. No thyromegaly present.  Cardiovascular: Normal rate, regular rhythm, normal heart sounds and intact distal pulses.   No murmur heard. Pulmonary/Chest: Effort normal and breath sounds normal. She has no wheezes. She exhibits no tenderness.  Abdominal: Soft. Bowel sounds are normal.  Musculoskeletal: She exhibits edema and tenderness.       Left knee pain after overuse  Lymphadenopathy:    She has no cervical  adenopathy.  Neurological: She is alert and oriented to person, place, and time. She has normal reflexes. No cranial nerve deficit.  Skin: Skin is warm and dry. She is not diaphoretic.  Psychiatric: She has a normal mood and affect. Her behavior is normal.          Assessment & Plan:  Needs to take the crestor!!!!!!!!  Knee pain from over use Discussed option Knee injection  Informed consent obtained and the patient's knee was prepped with betadine. Local anesthesia was obtained with topical spray. Then 40 mg of Depo-Medrol and 1/2 cc of lidocaine was injected into the joint space. The patient tolerated the procedure without complications. Post injection care discussed with patient.

## 2012-01-16 NOTE — Addendum Note (Signed)
Addended by: Stacie Glaze on: 01/16/2012 03:18 PM   Modules accepted: Orders

## 2012-01-27 ENCOUNTER — Telehealth: Payer: Self-pay | Admitting: Internal Medicine

## 2012-01-27 NOTE — Telephone Encounter (Signed)
Patient called stating that her knee is no better and per MD advised to call back. Please advise.

## 2012-01-27 NOTE — Telephone Encounter (Signed)
Suggested going back and see the 2nd md she saw

## 2012-02-23 ENCOUNTER — Other Ambulatory Visit: Payer: Medicare Other

## 2012-03-01 ENCOUNTER — Ambulatory Visit: Payer: Medicare Other | Admitting: Internal Medicine

## 2012-03-16 ENCOUNTER — Other Ambulatory Visit: Payer: Medicare Other

## 2012-03-23 ENCOUNTER — Ambulatory Visit (INDEPENDENT_AMBULATORY_CARE_PROVIDER_SITE_OTHER)
Admission: RE | Admit: 2012-03-23 | Discharge: 2012-03-23 | Disposition: A | Discharge status: Home / Self Care | Payer: Medicare Other | Source: Ambulatory Visit | Attending: Internal Medicine | Admitting: Internal Medicine

## 2012-03-23 DIAGNOSIS — M8080XA Other osteoporosis with current pathological fracture, unspecified site, initial encounter for fracture: Secondary | ICD-10-CM

## 2012-03-23 DIAGNOSIS — M810 Age-related osteoporosis without current pathological fracture: Secondary | ICD-10-CM

## 2012-03-23 DIAGNOSIS — M8440XA Pathological fracture, unspecified site, initial encounter for fracture: Secondary | ICD-10-CM

## 2012-04-11 ENCOUNTER — Other Ambulatory Visit (INDEPENDENT_AMBULATORY_CARE_PROVIDER_SITE_OTHER): Payer: Medicare Other

## 2012-04-11 DIAGNOSIS — E785 Hyperlipidemia, unspecified: Secondary | ICD-10-CM

## 2012-04-11 LAB — LDL CHOLESTEROL, DIRECT: Direct LDL: 162.4 mg/dL

## 2012-04-11 LAB — HEPATIC FUNCTION PANEL
ALT: 15 U/L (ref 0–35)
AST: 19 U/L (ref 0–37)
Albumin: 3.7 g/dL (ref 3.5–5.2)
Alkaline Phosphatase: 72 U/L (ref 39–117)
Bilirubin, Direct: 0.1 mg/dL (ref 0.0–0.3)
Total Bilirubin: 0.7 mg/dL (ref 0.3–1.2)
Total Protein: 7 g/dL (ref 6.0–8.3)

## 2012-04-11 LAB — LIPID PANEL
Cholesterol: 224 mg/dL — ABNORMAL HIGH (ref 0–200)
Total CHOL/HDL Ratio: 4

## 2012-04-18 ENCOUNTER — Ambulatory Visit: Payer: Medicare Other | Admitting: Internal Medicine

## 2012-05-04 ENCOUNTER — Ambulatory Visit (INDEPENDENT_AMBULATORY_CARE_PROVIDER_SITE_OTHER): Payer: Medicare Other | Admitting: Internal Medicine

## 2012-05-04 ENCOUNTER — Encounter: Payer: Self-pay | Admitting: Internal Medicine

## 2012-05-04 VITALS — BP 144/80 | HR 76 | Temp 98.6°F | Resp 16 | Ht 66.5 in | Wt 162.0 lb

## 2012-05-04 DIAGNOSIS — I10 Essential (primary) hypertension: Secondary | ICD-10-CM

## 2012-05-04 DIAGNOSIS — E785 Hyperlipidemia, unspecified: Secondary | ICD-10-CM

## 2012-05-04 DIAGNOSIS — E039 Hypothyroidism, unspecified: Secondary | ICD-10-CM

## 2012-05-04 DIAGNOSIS — I119 Hypertensive heart disease without heart failure: Secondary | ICD-10-CM

## 2012-05-04 DIAGNOSIS — K219 Gastro-esophageal reflux disease without esophagitis: Secondary | ICD-10-CM

## 2012-05-04 MED ORDER — LEVOTHYROXINE SODIUM 125 MCG PO TABS: 125.0000 ug | ORAL_TABLET | Freq: Every day | ORAL | Status: DC

## 2012-05-04 MED ORDER — ROSUVASTATIN CALCIUM 20 MG PO TABS: 20.0000 mg | ORAL_TABLET | ORAL | Status: DC

## 2012-05-04 MED ORDER — RANITIDINE HCL 300 MG PO TABS: 300.0000 mg | ORAL_TABLET | Freq: Every day | ORAL | Status: DC

## 2012-05-04 MED ORDER — TRIAMTERENE-HCTZ 37.5-25 MG PO TABS: 2.0000 | ORAL_TABLET | Freq: Every day | ORAL | Status: DC

## 2012-05-04 NOTE — Progress Notes (Signed)
  Subjective:    Patient ID: Amy Lin, female    DOB: August 09, 1933, 79 y.o.   MRN: [id number]  HPI Has been trying to stop the nexium   Review of Systems     Objective:   Physical Exam        Assessment & Plan:

## 2012-05-04 NOTE — Patient Instructions (Addendum)
Replace the nexium zantac Every other day on the nexium until the zantac comes

## 2012-05-21 ENCOUNTER — Encounter: Payer: Self-pay | Admitting: Internal Medicine

## 2012-05-25 ENCOUNTER — Encounter: Payer: Self-pay | Admitting: Internal Medicine

## 2012-05-28 ENCOUNTER — Other Ambulatory Visit: Payer: Self-pay | Admitting: *Deleted

## 2012-05-28 DIAGNOSIS — K219 Gastro-esophageal reflux disease without esophagitis: Secondary | ICD-10-CM

## 2012-05-28 MED ORDER — LANSOPRAZOLE 30 MG PO CPDR: 30.0000 mg | DELAYED_RELEASE_CAPSULE | Freq: Every day | ORAL | Status: DC

## 2012-05-28 NOTE — Telephone Encounter (Signed)
I am sorry that message was not answered in a timely matter.  It was my fault and I apologize.  But dr Lovell Sheehan wants to you to stop the zantac and try prevacid 30 mg. every day.  Which drug store would you like me to send that to? If it doesn't work please let us know and we answer it .  Again I am sorry for the first question

## 2012-06-26 ENCOUNTER — Ambulatory Visit (INDEPENDENT_AMBULATORY_CARE_PROVIDER_SITE_OTHER): Payer: Medicare Other | Admitting: Family Medicine

## 2012-06-26 ENCOUNTER — Encounter: Payer: Self-pay | Admitting: Family Medicine

## 2012-06-26 VITALS — BP 126/72 | HR 83 | Temp 98.5°F | Wt 152.0 lb

## 2012-06-26 DIAGNOSIS — J209 Acute bronchitis, unspecified: Secondary | ICD-10-CM

## 2012-06-26 MED ORDER — AZITHROMYCIN 250 MG PO TABS: ORAL_TABLET | ORAL | Status: DC

## 2012-06-26 NOTE — Progress Notes (Signed)
  Subjective:    Patient ID: Amy Lin, female    DOB: 1933-08-03, 79 y.o.   MRN: [id number]  HPI Here for 5 days of chest tightness and coughing up yellow sputum. No fever. Using Robitussin.    Review of Systems  Constitutional: Negative.   HENT: Positive for postnasal drip.   Eyes: Negative.   Respiratory: Positive for cough and chest tightness.        Objective:   Physical Exam  Constitutional: She appears well-developed and well-nourished.  HENT:  Right Ear: External ear normal.  Left Ear: External ear normal.  Nose: Nose normal.  Mouth/Throat: Oropharynx is clear and moist.  Eyes: Conjunctivae are normal.  Pulmonary/Chest: Effort normal and breath sounds normal. No respiratory distress. She has no wheezes. She has no rales.  Lymphadenopathy:    She has no cervical adenopathy.          Assessment & Plan:  Drink fluids.

## 2012-06-29 ENCOUNTER — Encounter: Payer: Self-pay | Admitting: Family Medicine

## 2012-06-29 ENCOUNTER — Telehealth: Payer: Self-pay | Admitting: Internal Medicine

## 2012-06-29 ENCOUNTER — Ambulatory Visit (INDEPENDENT_AMBULATORY_CARE_PROVIDER_SITE_OTHER): Payer: Medicare Other | Admitting: Family Medicine

## 2012-06-29 VITALS — BP 100/64 | Temp 98.4°F | Wt 153.0 lb

## 2012-06-29 DIAGNOSIS — J069 Acute upper respiratory infection, unspecified: Secondary | ICD-10-CM

## 2012-06-29 DIAGNOSIS — J209 Acute bronchitis, unspecified: Secondary | ICD-10-CM

## 2012-06-29 DIAGNOSIS — J309 Allergic rhinitis, unspecified: Secondary | ICD-10-CM

## 2012-06-29 MED ORDER — PREDNISONE 20 MG PO TABS: 40.0000 mg | ORAL_TABLET | Freq: Every day | ORAL | Status: DC

## 2012-06-29 MED ORDER — GUAIFENESIN-CODEINE 100-10 MG/5ML PO SYRP: 5.0000 mL | ORAL_SOLUTION | Freq: Three times a day (TID) | ORAL | Status: DC | PRN

## 2012-06-29 NOTE — Telephone Encounter (Signed)
[facility] Triage Call Report Triage Record Num: [id number] Operator: Geanie Berlin Patient Name: Amy Lin Call Date & Time: 06/28/2012 5:05:53PM Patient Phone: PCP: Darryll Capers Patient Gender: Female PCP Fax : [phone] Patient DOB: [birth year] [facility] Name[facility] Reason for Call: Caller: [name]; PCP: Darryll Capers [facility]); CB#: [phone]; Call regarding Cough/Congestion; Onset: 06/22/12. Temp 100.9 po at 1500. On Zpack and using Robitussin since office visit 06/26/12 with Dr Clent Ridges. Reports new onset of facial pain, frontal headache and yellow nasal mucus and phlem. Advised to see MD within 24 hours for pressure/pain above or below eyes and yellow drainage from nose per Face Pain or Swelling guideline. Appointment scheduled for 06/29/12 at 0900 with Dr Laurel Dimmer. Protocol(s) Used: Face Pain or Swelling Recommended Outcome per Protocol: See Provider within 24 hours Reason for Outcome: Pressure/pain above or below eyes, near ears over sinuses (may also be described as fullness, worsens when bending forward, teeth or eye pain) AND yellow-green drainage from nose or down back of throat. Care Advice: ~ Use a cool mist humidifier to moisten air. Be sure to clean according to manufacturer's instructions. ~ See provider in 8 hours if redness, swelling and tenderness develops above or below eyes/near ears/over sinuses ~ Consider use of a saline nasal spray per package directions to help relieve nasal congestion. ~ SYMPTOM / CONDITION MANAGEMENT ~ CAUTIONS A warm, moist compress placed on face, over eyes for 15 to 20 minutes, 5 to 6 times a day, may help relieve the congestion. ~ Analgesic/Antipyretic Advice - NSAIDs: Consider aspirin, ibuprofen, naproxen or ketoprofen for pain or fever as directed on label or by pharmacist/provider. PRECAUTIONS: - If over 53 years of age, should not take longer than 1 week without consulting provider. EXCEPTIONS: - Should  not be used if taking blood thinners or have bleeding problems. - Do not use if have history of sensitivity/allergy to any of these medications; or history of cardiovascular, ulcer, kidney, liver disease or diabetes unless approved by provider. - Do not exceed recommended dose or frequency. ~ 06/28/2012 5:25:15PM Page 1 of 1 [id number]

## 2012-06-29 NOTE — Patient Instructions (Signed)
-  As we discussed, we have prescribed a new medication for you at this appointment. We discussed the common and serious potential adverse effects of this medication and you can review these and more with the pharmacist when you pick up your medication.  Please follow the instructions for use carefully and notify us immediately if you have any problems taking this medication.  Follow up as needed  

## 2012-06-29 NOTE — Progress Notes (Signed)
Chief Complaint  Patient presents with  . Cough    congestion, sinus pain and pressure, fever spikes at noon, right eye pain and burn, right ear pain     HPI:  -started: 1 week ago, saw PCP a few days ago and started on zpack -symptoms:nasal congestion, sore throat, cough, R sinus pain and pressure, R ear pain and fullness, low grade temp not above 101 - but last few days not above 100, feels wheezy at times -denies:fever, SOB, NVD, tooth pain -has tried: robitussin, completing course of azithromycin -sick contacts: none known -Hx of: sinusitis, no chronic lung problems, not a smoker -hx of allergies and she thought this might be what is going on   ROS: See pertinent positives and negatives per HPI.  Past Medical History  Diagnosis Date  . Osteoporosis   . Hyperlipidemia   . GERD (gastroesophageal reflux disease)   . Hypertension   . Thyroid disease   . Allergy     Family History  Problem Relation Age of Onset  . Heart disease Mother   . Stroke Father   . Hypertension Father   . Heart disease Father   . Diabetes Father     History   Social History  . Marital Status: Divorced    Spouse Name: N/A    Number of Children: N/A  . Years of Education: N/A   Social History Main Topics  . Smoking status: Never Smoker   . Smokeless tobacco: Never Used  . Alcohol Use: No  . Drug Use: No  . Sexually Active: Yes   Other Topics Concern  . None   Social History Narrative  . None    Current outpatient prescriptions:aspirin 81 MG EC tablet, Take 81 mg by mouth daily.  , Disp: , Rfl: ;  azithromycin (ZITHROMAX) 250 MG tablet, As directed, Disp: 6 tablet, Rfl: 0;  calcium-vitamin D (OSCAL WITH D) 500-200 MG-UNIT per tablet, Take 1 tablet by mouth daily.  , Disp: , Rfl: ;  cetirizine (ZYRTEC) 10 MG tablet, Take 10 mg by mouth daily.  , Disp: , Rfl: ;  Cholecalciferol (VITAMIN D3) 2000 UNITS TABS, Take by mouth.  , Disp: , Rfl:  Cyanocobalamin (VITAMIN B 12 PO), Take by mouth  daily., Disp: , Rfl: ;  diclofenac sodium (VOLTAREN) 1 % GEL, Apply 1 application topically 4 (four) times daily., Disp: 100 g, Rfl: 4;  folic acid (FOLVITE) 1 MG tablet, Take 1 tablet (1 mg total) by mouth daily., Disp: 90 tablet, Rfl: 3;  levothyroxine (SYNTHROID, LEVOTHROID) 125 MCG tablet, Take 1 tablet (125 mcg total) by mouth daily., Disp: 90 tablet, Rfl: 3 Magnesium 250 MG TABS, Take 1 tablet by mouth daily., Disp: , Rfl: ;  rosuvastatin (CRESTOR) 20 MG tablet, Take 1 tablet (20 mg total) by mouth 3 (three) times a week., Disp: 45 tablet, Rfl: 3;  triamterene-hydrochlorothiazide (MAXZIDE-25) 37.5-25 MG per tablet, Take 2 each (2 tablets total) by mouth daily., Disp: 180 tablet, Rfl: 3;  Wheat Dextrin (BENEFIBER PO), Take 1 tablet by mouth 2 (two) times daily.  , Disp: , Rfl:  esomeprazole (NEXIUM) 40 MG capsule, Take 40 mg by mouth daily before breakfast., Disp: , Rfl: ;  guaiFENesin-codeine (ROBITUSSIN AC) 100-10 MG/5ML syrup, Take 5 mLs by mouth 3 (three) times daily as needed for cough., Disp: 120 mL, Rfl: 0;  lansoprazole (PREVACID) 30 MG capsule, Take 1 capsule (30 mg total) by mouth daily., Disp: 30 capsule, Rfl: 6 predniSONE (DELTASONE) 20 MG tablet, Take  2 tablets (40 mg total) by mouth daily., Disp: 10 tablet, Rfl: 0  EXAM:  Filed Vitals:   06/29/12 0907  BP: 100/64  Temp: 98.4 F (36.9 C)    Body mass index is 24.33 kg/(m^2).  GENERAL: vitals reviewed and listed above, alert, oriented, appears well hydrated and in no acute distress  HEENT: atraumatic, conjunttiva clear, no obvious abnormalities on inspection of external nose and ears, normal appearance of ear canals and TMs, clear nasal congestion, mild post oropharyngeal erythema with PND, no tonsillar edema or exudate, no sinus TTP  NECK: no obvious masses on inspection  LUNGS: clear to auscultation bilaterally, no wheezes, rales or rhonchi, good air movement  CV: HRRR, no peripheral edema  MS: moves all extremities  without noticeable abnormality  PSYCH: pleasant and cooperative, no obvious depression or anxiety  ASSESSMENT AND PLAN:  Discussed the following assessment and plan:  Upper respiratory infection - Plan: predniSONE (DELTASONE) 20 MG tablet  Acute bronchitis - Plan: predniSONE (DELTASONE) 20 MG tablet, guaiFENesin-codeine (ROBITUSSIN AC) 100-10 MG/5ML syrup  Allergic rhinitis - Plan: predniSONE (DELTASONE) 20 MG tablet  -more likely viral with RAD - advised to go ahead and complete abx, adding prednisone and cough medication - risks discussed. Return precuations. -Patient advised to return or notify a doctor immediately if symptoms worsen or persist or new concerns arise.  There are no Patient Instructions on file for this visit.   Kriste Basque R.

## 2012-07-06 ENCOUNTER — Encounter: Payer: Self-pay | Admitting: Internal Medicine

## 2012-09-03 ENCOUNTER — Ambulatory Visit (INDEPENDENT_AMBULATORY_CARE_PROVIDER_SITE_OTHER): Payer: Self-pay | Admitting: Internal Medicine

## 2012-09-03 ENCOUNTER — Encounter: Payer: Self-pay | Admitting: Internal Medicine

## 2012-09-03 ENCOUNTER — Other Ambulatory Visit: Payer: Self-pay | Admitting: *Deleted

## 2012-09-03 VITALS — BP 132/80 | HR 72 | Temp 98.2°F | Resp 16 | Ht 66.5 in | Wt 156.0 lb

## 2012-09-03 DIAGNOSIS — M810 Age-related osteoporosis without current pathological fracture: Secondary | ICD-10-CM

## 2012-09-03 DIAGNOSIS — E785 Hyperlipidemia, unspecified: Secondary | ICD-10-CM

## 2012-09-03 DIAGNOSIS — E039 Hypothyroidism, unspecified: Secondary | ICD-10-CM

## 2012-09-03 LAB — CALCIUM: Calcium: 9.7 mg/dL (ref 8.4–10.5)

## 2012-09-03 LAB — HEPATIC FUNCTION PANEL
ALT: 15 U/L (ref 0–35)
Total Bilirubin: 0.8 mg/dL (ref 0.3–1.2)
Total Protein: 7.2 g/dL (ref 6.0–8.3)

## 2012-09-03 LAB — LIPID PANEL
HDL: 51.4 mg/dL (ref >39.00)
Triglycerides: 120 mg/dL (ref 0.0–149.0)
VLDL: 24 mg/dL (ref 0.0–40.0)

## 2012-09-03 MED ORDER — LEVOTHYROXINE SODIUM 125 MCG PO TABS: 125.0000 ug | ORAL_TABLET | Freq: Every day | ORAL | Status: DC

## 2012-09-03 NOTE — Progress Notes (Signed)
  Subjective:    Patient ID: Amy Lin, female    DOB: 09-02-1933, 79 y.o.   MRN: [id number]  HPI HTN stable GERD has resolved back on the nexium Weight loss due to GERD has stabilized Lipids Changed medciations  Review of Systems  Constitutional: Negative for activity change, appetite change and fatigue.  HENT: Negative for ear pain, congestion, neck pain, postnasal drip and sinus pressure.   Eyes: Negative for redness and visual disturbance.  Respiratory: Negative for cough, shortness of breath and wheezing.   Gastrointestinal: Negative for abdominal pain and abdominal distention.  Genitourinary: Negative for dysuria, frequency and menstrual problem.  Musculoskeletal: Negative for myalgias, joint swelling and arthralgias.  Skin: Negative for rash and wound.  Neurological: Negative for dizziness, weakness and headaches.  Hematological: Negative for adenopathy. Does not bruise/bleed easily.  Psychiatric/Behavioral: Negative for sleep disturbance and decreased concentration.       Objective:   Physical Exam  Constitutional: She is oriented to person, place, and time. She appears well-developed and well-nourished. No distress.  HENT:  Head: Normocephalic and atraumatic.  Eyes: Conjunctivae and EOM are normal. Pupils are equal, round, and reactive to light.  Neck: Normal range of motion. Neck supple. No JVD present. No tracheal deviation present. No thyromegaly present.  Cardiovascular: Normal rate, regular rhythm and intact distal pulses.   Murmur heard. Pulmonary/Chest: Effort normal and breath sounds normal. She has no wheezes. She exhibits no tenderness.  Abdominal: Soft. Bowel sounds are normal.  Musculoskeletal: Normal range of motion. She exhibits no edema and no tenderness.  Lymphadenopathy:    She has no cervical adenopathy.  Neurological: She is alert and oriented to person, place, and time. She has normal reflexes. No cranial nerve deficit.  Skin: Skin is warm and  dry. She is not diaphoretic.  Psychiatric: She has a normal mood and affect. Her behavior is normal.          Assessment & Plan:  Lipid monitoring today for change in medications Patient had bone density in 2014 is compared to the previous year her bone density was stable so she does not have a rapid decrease in bone density but she does have a fracture risk that could be changed with the use of reclast Will check a calcium and a vitamin D level today and if they are acceptable we will go ahead and set her up for reclast

## 2012-09-03 NOTE — Patient Instructions (Signed)
bonnye will set up reclast for you

## 2012-09-04 ENCOUNTER — Ambulatory Visit: Payer: Self-pay

## 2012-09-04 DIAGNOSIS — M810 Age-related osteoporosis without current pathological fracture: Secondary | ICD-10-CM

## 2012-09-04 LAB — VITAMIN D 25 HYDROXY (VIT D DEFICIENCY, FRACTURES): Vit D, 25-Hydroxy: 56 ng/mL (ref 30–89)

## 2012-09-06 ENCOUNTER — Encounter: Payer: Self-pay | Admitting: Internal Medicine

## 2012-09-24 ENCOUNTER — Other Ambulatory Visit (HOSPITAL_COMMUNITY): Payer: Self-pay | Admitting: Internal Medicine

## 2012-09-27 ENCOUNTER — Ambulatory Visit (HOSPITAL_COMMUNITY): Admission: RE | Admit: 2012-09-27 | Payer: Medicare Other | Source: Ambulatory Visit

## 2012-09-28 ENCOUNTER — Encounter (HOSPITAL_COMMUNITY)
Admission: RE | Admit: 2012-09-28 | Discharge: 2012-09-28 | Disposition: A | Discharge status: Home / Self Care | Payer: Medicare Other | Source: Ambulatory Visit | Attending: Internal Medicine | Admitting: Internal Medicine

## 2012-09-28 ENCOUNTER — Encounter (HOSPITAL_COMMUNITY): Payer: Self-pay

## 2012-09-28 DIAGNOSIS — M810 Age-related osteoporosis without current pathological fracture: Secondary | ICD-10-CM | POA: Insufficient documentation

## 2012-09-28 MED ORDER — ZOLEDRONIC ACID 5 MG/100ML IV SOLN
5.0000 mg | Freq: Once | INTRAVENOUS | Status: AC
  Administered 2012-09-28: 5 mg via INTRAVENOUS
  Filled 2012-09-28: qty 100

## 2012-09-28 MED ORDER — SODIUM CHLORIDE 0.9 % IV SOLN
INTRAVENOUS | Status: AC
  Administered 2012-09-28: 14:00:00 via INTRAVENOUS

## 2012-09-28 NOTE — Progress Notes (Signed)
Monitored pt x post reclast infusion, tolerated well, no reaction.

## 2012-10-16 ENCOUNTER — Ambulatory Visit (INDEPENDENT_AMBULATORY_CARE_PROVIDER_SITE_OTHER): Payer: Medicare Other

## 2012-10-16 DIAGNOSIS — Z23 Encounter for immunization: Secondary | ICD-10-CM

## 2012-12-10 ENCOUNTER — Other Ambulatory Visit: Payer: Self-pay | Admitting: *Deleted

## 2012-12-10 ENCOUNTER — Encounter: Payer: Self-pay | Admitting: Internal Medicine

## 2012-12-10 DIAGNOSIS — E785 Hyperlipidemia, unspecified: Secondary | ICD-10-CM

## 2012-12-10 MED ORDER — ROSUVASTATIN CALCIUM 20 MG PO TABS: ORAL_TABLET | ORAL | Status: DC

## 2012-12-17 ENCOUNTER — Other Ambulatory Visit: Payer: Self-pay | Admitting: *Deleted

## 2012-12-17 DIAGNOSIS — E785 Hyperlipidemia, unspecified: Secondary | ICD-10-CM

## 2012-12-17 MED ORDER — ROSUVASTATIN CALCIUM 20 MG PO TABS: ORAL_TABLET | ORAL | Status: DC

## 2012-12-25 ENCOUNTER — Encounter: Payer: Self-pay | Admitting: Internal Medicine

## 2013-01-09 ENCOUNTER — Ambulatory Visit (INDEPENDENT_AMBULATORY_CARE_PROVIDER_SITE_OTHER): Payer: Medicare Other | Admitting: Internal Medicine

## 2013-01-09 ENCOUNTER — Encounter: Payer: Self-pay | Admitting: Internal Medicine

## 2013-01-09 DIAGNOSIS — M19079 Primary osteoarthritis, unspecified ankle and foot: Secondary | ICD-10-CM

## 2013-01-09 DIAGNOSIS — M1710 Unilateral primary osteoarthritis, unspecified knee: Secondary | ICD-10-CM

## 2013-01-09 NOTE — Progress Notes (Signed)
   Subjective:    Patient ID: Amy Lin, female    DOB: 16-Nov-1933, 79 y.o.   MRN: [id number]  HPI Follow up  hyperlipidemia   Stable Lipids in August Sub acute foot pain for 6 weeks? Using the voltarin gel    Review of Systems  Constitutional: Negative for activity change, appetite change and fatigue.  HENT: Negative for congestion, ear pain, postnasal drip and sinus pressure.   Eyes: Negative for redness and visual disturbance.  Respiratory: Negative for cough, shortness of breath and wheezing.   Gastrointestinal: Negative for abdominal pain and abdominal distention.  Genitourinary: Negative for dysuria, frequency and menstrual problem.  Musculoskeletal: Negative for arthralgias, joint swelling, myalgias and neck pain.  Skin: Negative for rash and wound.  Neurological: Negative for dizziness, weakness and headaches.  Hematological: Negative for adenopathy. Does not bruise/bleed easily.  Psychiatric/Behavioral: Negative for sleep disturbance and decreased concentration.       Objective:   Physical Exam  Nursing note and vitals reviewed. Constitutional: She is oriented to person, place, and time. She appears well-developed and well-nourished. No distress.  HENT:  Head: Normocephalic and atraumatic.  Mouth/Throat: Oropharynx is clear and moist.  Eyes: Conjunctivae and EOM are normal. Pupils are equal, round, and reactive to light.  Neck: Normal range of motion. Neck supple. No JVD present. No tracheal deviation present. No thyromegaly present.  Cardiovascular: Normal rate and regular rhythm.   Murmur heard. Pulmonary/Chest: Effort normal and breath sounds normal. She has no wheezes. She exhibits no tenderness.  Abdominal: Soft. Bowel sounds are normal.  Musculoskeletal: Normal range of motion. She exhibits edema and tenderness.  Lymphadenopathy:    She has no cervical adenopathy.  Neurological: She is alert and oriented to person, place, and time. She has normal  reflexes. No cranial nerve deficit.  Skin: Skin is warm and dry. She is not diaphoretic.  Psychiatric: She has a normal mood and affect. Her behavior is normal.          Assessment & Plan:  Inject foot for sub acute OA Knee pain with need for TRK  meloxicam for OA  Reviewed medications Stable lipid MWF crestor Stable thyroid

## 2013-01-09 NOTE — Patient Instructions (Signed)
The patient is instructed to continue all medications as prescribed. Schedule followup with check out clerk upon leaving the clinic  

## 2013-02-08 ENCOUNTER — Other Ambulatory Visit: Payer: Self-pay | Admitting: *Deleted

## 2013-02-08 ENCOUNTER — Telehealth: Payer: Self-pay | Admitting: Internal Medicine

## 2013-02-08 DIAGNOSIS — M25562 Pain in left knee: Secondary | ICD-10-CM

## 2013-02-08 NOTE — Telephone Encounter (Signed)
Per [facility], can pcp please refer to them for a MRI of left knee?  Please advise.  Once this is complete please call [facility]) [phone] ext. [phone] and fax referral to [phone]. Thank you.

## 2013-02-26 ENCOUNTER — Telehealth: Payer: Self-pay | Admitting: Internal Medicine

## 2013-02-26 NOTE — Telephone Encounter (Signed)
Please place order for pt to see Dr.Norris at [facility] for knee pain. She's an established pt there, and requested a [employer] referral. Thank you!

## 2013-02-26 NOTE — Telephone Encounter (Signed)
Amy Lin informed, already there

## 2013-03-03 HISTORY — PX: MENISCECTOMY: SHX123

## 2013-04-03 ENCOUNTER — Encounter: Payer: Self-pay | Admitting: Internal Medicine

## 2013-04-04 MED ORDER — ESOMEPRAZOLE MAGNESIUM 40 MG PO CPDR: 40.0000 mg | DELAYED_RELEASE_CAPSULE | Freq: Every day | ORAL | Status: DC

## 2013-04-23 ENCOUNTER — Encounter: Payer: Self-pay | Admitting: Internal Medicine

## 2013-04-23 DIAGNOSIS — E039 Hypothyroidism, unspecified: Secondary | ICD-10-CM

## 2013-04-23 MED ORDER — TRIAMTERENE-HCTZ 37.5-25 MG PO TABS: 2.0000 | ORAL_TABLET | Freq: Every day | ORAL | Status: DC

## 2013-04-23 MED ORDER — LEVOTHYROXINE SODIUM 125 MCG PO TABS: 125.0000 ug | ORAL_TABLET | Freq: Every day | ORAL | Status: DC

## 2013-06-02 ENCOUNTER — Emergency Department (HOSPITAL_COMMUNITY)
Admission: EM | Admit: 2013-06-02 | Discharge: 2013-06-02 | Disposition: A | Discharge status: Home / Self Care | Payer: Medicare HMO | Attending: Emergency Medicine | Admitting: Emergency Medicine

## 2013-06-02 ENCOUNTER — Emergency Department (HOSPITAL_COMMUNITY): Payer: Medicare HMO

## 2013-06-02 ENCOUNTER — Encounter (HOSPITAL_COMMUNITY): Payer: Self-pay | Admitting: Emergency Medicine

## 2013-06-02 DIAGNOSIS — M549 Dorsalgia, unspecified: Secondary | ICD-10-CM

## 2013-06-02 DIAGNOSIS — M5126 Other intervertebral disc displacement, lumbar region: Secondary | ICD-10-CM | POA: Insufficient documentation

## 2013-06-02 DIAGNOSIS — Z791 Long term (current) use of non-steroidal anti-inflammatories (NSAID): Secondary | ICD-10-CM | POA: Insufficient documentation

## 2013-06-02 DIAGNOSIS — M810 Age-related osteoporosis without current pathological fracture: Secondary | ICD-10-CM | POA: Insufficient documentation

## 2013-06-02 DIAGNOSIS — Z7982 Long term (current) use of aspirin: Secondary | ICD-10-CM | POA: Insufficient documentation

## 2013-06-02 DIAGNOSIS — E785 Hyperlipidemia, unspecified: Secondary | ICD-10-CM | POA: Insufficient documentation

## 2013-06-02 DIAGNOSIS — K219 Gastro-esophageal reflux disease without esophagitis: Secondary | ICD-10-CM | POA: Insufficient documentation

## 2013-06-02 DIAGNOSIS — E079 Disorder of thyroid, unspecified: Secondary | ICD-10-CM | POA: Insufficient documentation

## 2013-06-02 DIAGNOSIS — I10 Essential (primary) hypertension: Secondary | ICD-10-CM | POA: Insufficient documentation

## 2013-06-02 DIAGNOSIS — Z79899 Other long term (current) drug therapy: Secondary | ICD-10-CM | POA: Insufficient documentation

## 2013-06-02 DIAGNOSIS — IMO0002 Reserved for concepts with insufficient information to code with codable children: Secondary | ICD-10-CM

## 2013-06-02 MED ORDER — HYDROCODONE-ACETAMINOPHEN 5-325 MG PO TABS
1.0000 | ORAL_TABLET | Freq: Once | ORAL | Status: AC
  Administered 2013-06-02: 1 via ORAL
  Filled 2013-06-02: qty 1

## 2013-06-02 MED ORDER — HYDROCODONE-ACETAMINOPHEN 5-325 MG PO TABS: 1.0000 | ORAL_TABLET | Freq: Four times a day (QID) | ORAL | Status: DC | PRN

## 2013-06-02 NOTE — ED Notes (Signed)
Pt states understanding of discharge instructions 

## 2013-06-02 NOTE — ED Notes (Signed)
She was making the bed and had sudden severe lower back pain and has been unable to stand due to the pain since. She had back surgery many years ago. She took advil with no relief. Ambulatory with cane to ed. No bowel/bladder changes

## 2013-06-02 NOTE — ED Provider Notes (Addendum)
CSN: [id number]     Arrival date & time 06/02/13  1450 History   First MD Initiated Contact with Patient 06/02/13 1738     Chief Complaint  Patient presents with  . Back Pain     (Consider location/radiation/quality/duration/timing/severity/associated sxs/prior Treatment) Patient is a 80 y.o. female presenting with back pain. The history is provided by the patient.  Back Pain Location:  Lumbar spine Quality:  Stabbing and shooting Radiates to:  Does not radiate Pain severity:  Severe Pain is:  Same all the time Onset quality:  Sudden Duration:  1 day Timing:  Constant Progression:  Unchanged Chronicity:  New Context comment:  Started when she was reaching across the bed to grab the sheet and felt a severe pain in the back which has been persistent Relieved by:  Being still Worsened by:  Standing (walking) Ineffective treatments:  Ibuprofen Associated symptoms: no abdominal pain, no bladder incontinence, no bowel incontinence, no dysuria, no fever, no leg pain, no numbness, no paresthesias, no perianal numbness, no tingling and no weakness   Risk factors: hx of osteoporosis   Risk factors: no steroid use     Past Medical History  Diagnosis Date  . Osteoporosis   . Hyperlipidemia   . GERD (gastroesophageal reflux disease)   . Hypertension   . Thyroid disease   . Allergy    Past Surgical History  Procedure Laterality Date  . Abdominal hysterectomy     Family History  Problem Relation Age of Onset  . Heart disease Mother   . Stroke Father   . Hypertension Father   . Heart disease Father   . Diabetes Father    History  Substance Use Topics  . Smoking status: Never Smoker   . Smokeless tobacco: Never Used  . Alcohol Use: No   OB History   Grav Para Term Preterm Abortions TAB SAB Ect Mult Living                 Review of Systems  Constitutional: Negative for fever.  Gastrointestinal: Negative for abdominal pain and bowel incontinence.  Genitourinary: Negative  for bladder incontinence and dysuria.  Musculoskeletal: Positive for back pain.  Neurological: Negative for tingling, weakness, numbness and paresthesias.  All other systems reviewed and are negative.     Allergies  Review of patient's allergies indicates no known allergies.  Home Medications   Prior to Admission medications   Medication Sig Start Date End Date Taking? Authorizing Provider  aspirin 81 MG EC tablet Take 81 mg by mouth daily.      Historical Provider, MD  calcium-vitamin D (OSCAL WITH D) 500-200 MG-UNIT per tablet Take 1 tablet by mouth daily.      Historical Provider, MD  cetirizine (ZYRTEC) 10 MG tablet Take 10 mg by mouth daily.      Historical Provider, MD  Cholecalciferol (VITAMIN D3) 2000 UNITS TABS Take by mouth.      Historical Provider, MD  Cyanocobalamin (VITAMIN B 12 PO) Take by mouth daily.    Historical Provider, MD  diclofenac sodium (VOLTAREN) 1 % GEL Apply 1 application topically 4 (four) times daily. 03/04/11   Ricard Dillon, MD  esomeprazole (NEXIUM) 40 MG capsule Take 1 capsule (40 mg total) by mouth daily before breakfast. 04/04/13   Ricard Dillon, MD  folic acid (FOLVITE) 1 MG tablet Take 1 tablet (1 mg total) by mouth daily. 03/04/11   Ricard Dillon, MD  levothyroxine (SYNTHROID, LEVOTHROID) 125 MCG tablet Take 1  tablet (125 mcg total) by mouth daily. 04/23/13   Ricard Dillon, MD  Magnesium 250 MG TABS Take 1 tablet by mouth daily.    Historical Provider, MD  rosuvastatin (CRESTOR) 20 MG tablet Take 1 tab daily as directed by dr Arnoldo Morale 12/17/12   Ricard Dillon, MD  triamterene-hydrochlorothiazide (MAXZIDE-25) 37.5-25 MG per tablet Take 2 tablets by mouth daily. 04/23/13   Ricard Dillon, MD  Wheat Dextrin (BENEFIBER PO) Take 1 tablet by mouth 2 (two) times daily.      Historical Provider, MD   BP 152/73  Pulse 68  Temp(Src) 98.6 F (37 C) (Oral)  Resp 16  Ht 5\' 5"  (1.651 m)  Wt 160 lb (72.576 kg)  BMI 26.63 kg/m2  SpO2 100% Physical Exam    Nursing note and vitals reviewed. Constitutional: She is oriented to person, place, and time. She appears well-developed and well-nourished. No distress.  HENT:  Head: Normocephalic and atraumatic.  Mouth/Throat: Oropharynx is clear and moist.  Eyes: Conjunctivae and EOM are normal. Pupils are equal, round, and reactive to light.  Neck: Normal range of motion. Neck supple.  Cardiovascular: Normal rate, regular rhythm and intact distal pulses.   No murmur heard. Pulmonary/Chest: Effort normal and breath sounds normal. No respiratory distress. She has no wheezes. She has no rales.  Abdominal: Soft. She exhibits no distension. There is no tenderness. There is no rebound and no guarding.  Musculoskeletal: Normal range of motion. She exhibits no edema and no tenderness.       Lumbar back: She exhibits bony tenderness. She exhibits no deformity, no spasm and normal pulse.       Back:  Neurological: She is alert and oriented to person, place, and time. She has normal strength. No sensory deficit.  Reflex Scores:      Patellar reflexes are 2+ on the right side and 2+ on the left side. Skin: Skin is warm and dry. No rash noted. No erythema.  Psychiatric: She has a normal mood and affect. Her behavior is normal.    ED Course  Procedures (including critical care time) Labs Review Labs Reviewed - No data to display  Imaging Review Dg Lumbar Spine Complete  06/02/2013   CLINICAL DATA:  Back pain.  Previous surgery.  EXAM: LUMBAR SPINE - COMPLETE 4+ VIEW  COMPARISON:  None.  FINDINGS: Five non-rib-bearing lumbar vertebrae. Ray cages at the L5-S1 level. Facet degenerative changes at the L4-5 and L5-S1 levels. Mild anterior spur formation at multiple levels of the lumbar and lower thoracic spine. Diffuse osteopenia. No fractures, pars defects or subluxations. Atheromatous arterial calcifications.  IMPRESSION: 1. No acute abnormality. 2. Postoperative and degenerative changes.   Electronically Signed    By: Enrique Sack M.D.   On: 06/02/2013 18:47     EKG Interpretation None      MDM   Final diagnoses:  Back pain  Disc herniation    Pt with sudden onset of back pain when reaching across the bed to grab the sheet yesterday.  No neurovascular compromise and no incontinence.  Denies radicular pain down into her legs and feet she is a comfortable and she gets up and tries to walk and has severe centralized back pain. Pt has no infectious sx, hx of CA  or other red flags concerning for pathologic back pain.  Pt is able to ambulate but is painful.  Normal strength and reflexes on exam.  Denies trauma.  She has a prior history of spinal surgery  18 years ago. Will give pt pain control and plain films of the L-spine shows no acute abnormality including no compression fractures in normal hardware placement without acute findings. Feel most likely that this is a bulging disc and this was discussed with the patient. Will ensure that patient has pain control with Vicodin and if so we'll discharge her home to follow up with her neurosurgeon.  8:18 PM Pt feels much better after vicodin and able to ambulate with manageable pain.  Blanchie Dessert, MD 06/02/13 7902  Blanchie Dessert, MD 06/02/13 2020

## 2013-06-02 NOTE — ED Notes (Signed)
Patient transported to X-ray 

## 2013-06-02 NOTE — Discharge Instructions (Signed)
Back Pain, Adult Low back pain is very common. About 1 in 5 people have back pain.The cause of low back pain is rarely dangerous. The pain often gets better over time.About half of people with a sudden onset of back pain feel better in just 2 weeks. About 8 in 10 people feel better by 6 weeks.  CAUSES Some common causes of back pain include:  Strain of the muscles or ligaments supporting the spine.  Wear and tear (degeneration) of the spinal discs.  Arthritis.  Direct injury to the back. DIAGNOSIS Most of the time, the direct cause of low back pain is not known.However, back pain can be treated effectively even when the exact cause of the pain is unknown.Answering your caregiver's questions about your overall health and symptoms is one of the most accurate ways to make sure the cause of your pain is not dangerous. If your caregiver needs more information, he or she may order lab work or imaging tests (X-rays or MRIs).However, even if imaging tests show changes in your back, this usually does not require surgery. HOME CARE INSTRUCTIONS For many people, back pain returns.Since low back pain is rarely dangerous, it is often a condition that people can learn to manageon their own.   Remain active. It is stressful on the back to sit or stand in one place. Do not sit, drive, or stand in one place for more than 30 minutes at a time. Take short walks on level surfaces as soon as pain allows.Try to increase the length of time you walk each day.  Do not stay in bed.Resting more than 1 or 2 days can delay your recovery.  Do not avoid exercise or work.Your body is made to move.It is not dangerous to be active, even though your back may hurt.Your back will likely heal faster if you return to being active before your pain is gone.  Pay attention to your body when you bend and lift. Many people have less discomfortwhen lifting if they bend their knees, keep the load close to their bodies,and  avoid twisting. Often, the most comfortable positions are those that put less stress on your recovering back.  Find a comfortable position to sleep. Use a firm mattress and lie on your side with your knees slightly bent. If you lie on your back, put a pillow under your knees.  Only take over-the-counter or prescription medicines as directed by your caregiver. Over-the-counter medicines to reduce pain and inflammation are often the most helpful.Your caregiver may prescribe muscle relaxant drugs.These medicines help dull your pain so you can more quickly return to your normal activities and healthy exercise.  Put ice on the injured area.  Put ice in a plastic bag.  Place a towel between your skin and the bag.  Leave the ice on for 15-20 minutes, 03-04 times a day for the first 2 to 3 days. After that, ice and heat may be alternated to reduce pain and spasms.  Ask your caregiver about trying back exercises and gentle massage. This may be of some benefit.  Avoid feeling anxious or stressed.Stress increases muscle tension and can worsen back pain.It is important to recognize when you are anxious or stressed and learn ways to manage it.Exercise is a great option. SEEK MEDICAL CARE IF:  You have pain that is not relieved with rest or medicine.  You have pain that does not improve in 1 week.  You have new symptoms.  You are generally not feeling well. SEEK   IMMEDIATE MEDICAL CARE IF:   You have pain that radiates from your back into your legs.  You develop new bowel or bladder control problems.  You have unusual weakness or numbness in your arms or legs.  You develop nausea or vomiting.  You develop abdominal pain.  You feel faint. Document Released: 01/17/2005 Document Revised: 07/19/2011 Document Reviewed: 06/07/2010 [employer] Patient Information 2014 [employer].  

## 2013-07-20 ENCOUNTER — Encounter: Payer: Self-pay | Admitting: Internal Medicine

## 2013-07-24 ENCOUNTER — Encounter: Payer: Medicare Other | Admitting: Internal Medicine

## 2013-07-30 ENCOUNTER — Ambulatory Visit (INDEPENDENT_AMBULATORY_CARE_PROVIDER_SITE_OTHER): Payer: Commercial Managed Care - HMO | Admitting: Physician Assistant

## 2013-07-30 ENCOUNTER — Telehealth: Payer: Self-pay | Admitting: Physician Assistant

## 2013-07-30 ENCOUNTER — Encounter: Payer: Self-pay | Admitting: Physician Assistant

## 2013-07-30 VITALS — BP 110/72 | HR 68 | Temp 97.9°F | Resp 18 | Wt 158.0 lb

## 2013-07-30 DIAGNOSIS — E039 Hypothyroidism, unspecified: Secondary | ICD-10-CM

## 2013-07-30 DIAGNOSIS — R202 Paresthesia of skin: Principal | ICD-10-CM

## 2013-07-30 DIAGNOSIS — R200 Anesthesia of skin: Secondary | ICD-10-CM

## 2013-07-30 DIAGNOSIS — R209 Unspecified disturbances of skin sensation: Secondary | ICD-10-CM

## 2013-07-30 LAB — CBC WITH DIFFERENTIAL/PLATELET
Basophils Absolute: 0 10*3/uL (ref 0.0–0.1)
Basophils Relative: 0.7 % (ref 0.0–3.0)
EOS PCT: 2.9 % (ref 0.0–5.0)
Eosinophils Absolute: 0.2 10*3/uL (ref 0.0–0.7)
HEMATOCRIT: 39.5 % (ref 36.0–46.0)
Hemoglobin: 13.2 g/dL (ref 12.0–15.0)
LYMPHS ABS: 1.6 10*3/uL (ref 0.7–4.0)
Lymphocytes Relative: 25.3 % (ref 12.0–46.0)
MCHC: 33.5 g/dL (ref 30.0–36.0)
MCV: 90.7 fl (ref 78.0–100.0)
Monocytes Absolute: 0.6 10*3/uL (ref 0.1–1.0)
Monocytes Relative: 9.9 % (ref 3.0–12.0)
Neutro Abs: 3.8 10*3/uL (ref 1.4–7.7)
Neutrophils Relative %: 61.2 % (ref 43.0–77.0)
Platelets: 227 10*3/uL (ref 150.0–400.0)
RBC: 4.35 Mil/uL (ref 3.87–5.11)
RDW: 13.3 % (ref 11.5–15.5)
WBC: 6.3 10*3/uL (ref 4.0–10.5)

## 2013-07-30 LAB — LIPID PANEL
Cholesterol: 249 mg/dL — ABNORMAL HIGH (ref 0–200)
HDL: 46.6 mg/dL (ref >39.00)
LDL CALC: 153 mg/dL — AB (ref 0–99)
NonHDL: 202.4
TRIGLYCERIDES: 248 mg/dL — AB (ref 0.0–149.0)
Total CHOL/HDL Ratio: 5
VLDL: 49.6 mg/dL — ABNORMAL HIGH (ref 0.0–40.0)

## 2013-07-30 LAB — HEPATIC FUNCTION PANEL
ALBUMIN: 4.1 g/dL (ref 3.5–5.2)
ALT: 14 U/L (ref 0–35)
AST: 21 U/L (ref 0–37)
Alkaline Phosphatase: 61 U/L (ref 39–117)
Bilirubin, Direct: 0 mg/dL (ref 0.0–0.3)
TOTAL PROTEIN: 7.5 g/dL (ref 6.0–8.3)
Total Bilirubin: 0.6 mg/dL (ref 0.2–1.2)

## 2013-07-30 LAB — TSH: TSH: 0.11 u[IU]/mL — ABNORMAL LOW (ref 0.35–4.50)

## 2013-07-30 LAB — BASIC METABOLIC PANEL
BUN: 17 mg/dL (ref 6–23)
CALCIUM: 9.8 mg/dL (ref 8.4–10.5)
CO2: 29 meq/L (ref 19–32)
CREATININE: 1.1 mg/dL (ref 0.4–1.2)
Chloride: 103 mEq/L (ref 96–112)
GFR: 50.59 mL/min — ABNORMAL LOW (ref >60.00)
GLUCOSE: 115 mg/dL — AB (ref 70–99)
Potassium: 4 mEq/L (ref 3.5–5.1)
Sodium: 140 mEq/L (ref 135–145)

## 2013-07-30 LAB — B12 AND FOLATE PANEL
Folate: 22.3 ng/mL (ref >5.9)
Vitamin B-12: 469 pg/mL (ref 211–911)

## 2013-07-30 LAB — HEMOGLOBIN A1C: HEMOGLOBIN A1C: 6.4 % (ref 4.6–6.5)

## 2013-07-30 MED ORDER — LEVOTHYROXINE SODIUM 112 MCG PO TABS: 112.0000 ug | ORAL_TABLET | Freq: Every day | ORAL | Status: DC

## 2013-07-30 NOTE — Progress Notes (Signed)
Subjective:    Patient ID: Amy Lin, female    DOB: 1933-04-30, 80 y.o.   MRN: [id number]  Arm Pain  There was no injury mechanism. The pain is present in the right hand. The quality of the pain is described as burning. The pain radiates to the right arm. The patient is experiencing no pain. The pain has been fluctuating since the incident. Associated symptoms include muscle weakness (localized to right hand), numbness (numbness always starts on lateral aspect of hand.) and tingling. Pertinent negatives include no chest pain. Nothing aggravates the symptoms. She has tried NSAIDs (shaking her hand and rubbing when numb seems to help some.) for the symptoms. The treatment provided no relief.  Has prior history of Low back surgery, as well as a being diagnosed with a pinched nerve in her cervical spine, however no surgery was done due to symptom resolution without treatment. Pt neurosurgeon is Dr. Hal Neer.    Review of Systems  Constitutional: Negative for fever and chills.  Respiratory: Negative for shortness of breath.   Cardiovascular: Negative for chest pain.  Gastrointestinal: Negative for nausea, vomiting and diarrhea.  Musculoskeletal: Negative for arthralgias, back pain, joint swelling, neck pain and neck stiffness.  Neurological: Positive for tingling and numbness (numbness always starts on lateral aspect of hand.).  All other systems reviewed and are negative.    Past Medical History  Diagnosis Date  . Osteoporosis   . Hyperlipidemia   . GERD (gastroesophageal reflux disease)   . Hypertension   . Thyroid disease   . Allergy     History   Social History  . Marital Status: Divorced    Spouse Name: N/A    Number of Children: N/A  . Years of Education: N/A   Occupational History  . Not on file.   Social History Main Topics  . Smoking status: Never Smoker   . Smokeless tobacco: Never Used  . Alcohol Use: No  . Drug Use: No  . Sexual Activity: Yes   Other  Topics Concern  . Not on file   Social History Narrative  . No narrative on file    Past Surgical History  Procedure Laterality Date  . Abdominal hysterectomy      Family History  Problem Relation Age of Onset  . Heart disease Mother   . Stroke Father   . Hypertension Father   . Heart disease Father   . Diabetes Father     No Known Allergies  Current Outpatient Prescriptions on File Prior to Visit  Medication Sig Dispense Refill  . aspirin 81 MG EC tablet Take 81 mg by mouth daily.        . calcium-vitamin D (OSCAL WITH D) 500-200 MG-UNIT per tablet Take 1 tablet by mouth daily.        . cetirizine (ZYRTEC) 10 MG tablet Take 10 mg by mouth daily.        . Cholecalciferol (VITAMIN D3) 2000 UNITS TABS Take by mouth.        . Cyanocobalamin (VITAMIN B 12 PO) Take by mouth daily.      Marland Kitchen esomeprazole (NEXIUM) 40 MG capsule Take 1 capsule (40 mg total) by mouth daily before breakfast.  90 capsule  3  . folic acid (FOLVITE) 1 MG tablet Take 1 tablet (1 mg total) by mouth daily.  90 tablet  3  . levothyroxine (SYNTHROID, LEVOTHROID) 125 MCG tablet Take 1 tablet (125 mcg total) by mouth daily.  90 tablet  3  .  Magnesium 250 MG TABS Take 1 tablet by mouth daily.      . rosuvastatin (CRESTOR) 20 MG tablet Take 1 tab daily as directed by dr Arnoldo Morale  90 tablet  3  . triamterene-hydrochlorothiazide (MAXZIDE-25) 37.5-25 MG per tablet Take 2 tablets by mouth daily.  180 tablet  3  . Wheat Dextrin (BENEFIBER PO) Take 1 tablet by mouth 2 (two) times daily.        . diclofenac sodium (VOLTAREN) 1 % GEL Apply 1 application topically 4 (four) times daily.  100 g  4  . HYDROcodone-acetaminophen (NORCO/VICODIN) 5-325 MG per tablet Take 1-2 tablets by mouth every 6 (six) hours as needed for moderate pain.  15 tablet  0   No current facility-administered medications on file prior to visit.    EXAM: BP 110/72  Pulse 68  Temp(Src) 97.9 F (36.6 C) (Oral)  Resp 18  Wt 158 lb (71.668 kg)  SpO2  98%     Objective:   Physical Exam  Nursing note and vitals reviewed. Constitutional: She is oriented to person, place, and time. She appears well-developed and well-nourished. No distress.  HENT:  Head: Normocephalic and atraumatic.  Eyes: Conjunctivae and EOM are normal. Pupils are equal, round, and reactive to light.  Neck: Normal range of motion. Neck supple.  Cardiovascular: Normal rate, regular rhythm and intact distal pulses.   Pulmonary/Chest: Effort normal and breath sounds normal. No respiratory distress. She exhibits no tenderness.  Musculoskeletal: Normal range of motion. She exhibits no edema and no tenderness.  Neurological: She is alert and oriented to person, place, and time. She has normal reflexes. She displays normal reflexes. No cranial nerve deficit. She exhibits normal muscle tone. Coordination normal.  Could not elicit numbness with cervical position. Sensation intact. Negative tinnel and phalen sign.  Skin: Skin is warm and dry. No rash noted. She is not diaphoretic. No erythema. No pallor.  Psychiatric: She has a normal mood and affect. Her behavior is normal. Judgment and thought content normal.      Lab Results  Component Value Date   WBC 7.1 07/29/2011   HGB 12.9 07/29/2011   HCT 39.2 07/29/2011   PLT 216.0 07/29/2011   GLUCOSE 105* 01/13/2012   CHOL 219* 09/03/2012   TRIG 120.0 09/03/2012   HDL 51.40 09/03/2012   LDLDIRECT 147.7 09/03/2012   LDLCALC 125* 07/20/2007   ALT 15 09/03/2012   AST 22 09/03/2012   NA 137 01/13/2012   K 3.3* 01/13/2012   CL 100 01/13/2012   CREATININE 1.1 09/04/2012   BUN 23 01/13/2012   CO2 30 01/13/2012   TSH 1.76 03/04/2011        Assessment & Plan:  Talley was seen today for numbness in right hand and arm and arthritis.  Diagnoses and associated orders for this visit:  Numbness and tingling in right hand Comments: Occasional radiation to right arm. Hx of cervical nerve compression. Will refer to orthopedics. - Ambulatory  referral to Orthopedic Surgery - B12 and Folate Panel - TSH - CBC with Differential - Lipid Panel - Hemoglobin U2P - Basic Metabolic Panel - Hepatic Function Panel  Lab work today.  Pt will schedule an appointment to establish with a new PCP today.  Return precautions provided, and patient handout on neuropathic pain.  Plan to follow up as needed, or for worsening or persistent symptoms despite treatment.  Patient Instructions  We will call with your lab results when available.  You will be called to schedule an  appointment with orthopedics to evaluate your numbness.  You need to schedule and appointment for an annual physical.  If emergency symptoms discussed during visit developed, seek medical attention immediately.  Followup as needed, or for worsening or persistent symptoms despite treatment.

## 2013-07-30 NOTE — Telephone Encounter (Signed)
Called pt, left HIPAA compliant message to return call.

## 2013-07-30 NOTE — Progress Notes (Signed)
Pre visit review using our clinic review tool, if applicable. No additional management support is needed unless otherwise documented below in the visit note. 

## 2013-07-30 NOTE — Patient Instructions (Signed)
We will call with your lab results when available.  You will be called to schedule an appointment with orthopedics to evaluate your numbness.  You need to schedule and appointment for an annual physical.  If emergency symptoms discussed during visit developed, seek medical attention immediately.  Followup as needed, or for worsening or persistent symptoms despite treatment.   Neuropathic Pain We often think that pain has a physical cause. If we get rid of the cause, the pain should go away. Nerves themselves can also cause pain. It is called neuropathic pain, which means nerve abnormality. It may be difficult for the patients who have it and for the treating caregivers. Pain is usually described as acute (short-lived) or chronic (long-lasting). Acute pain is related to the physical sensations caused by an injury. It can last from a few seconds to many weeks, but it usually goes away when normal healing occurs. Chronic pain lasts beyond the typical healing time. With neuropathic pain, the nerve fibers themselves may be damaged or injured. They then send incorrect signals to other pain centers. The pain you feel is real, but the cause is not easy to find.  CAUSES  Chronic pain can result from diseases, such as diabetes and shingles (an infection related to chickenpox), or from trauma, surgery, or amputation. It can also happen without any known injury or disease. The nerves are sending pain messages, even though there is no identifiable cause for such messages.   Other common causes of neuropathy include diabetes, phantom limb pain, or Regional Pain Syndrome (RPS).  As with all forms of chronic back pain, if neuropathy is not correctly treated, there can be a number of associated problems that lead to a downward cycle for the patient. These include depression, sleeplessness, feelings of fear and anxiety, limited social interaction and inability to do normal daily activities or work.  The most  dramatic and mysterious example of neuropathic pain is called "phantom limb syndrome." This occurs when an arm or a leg has been removed because of illness or injury. The brain still gets pain messages from the nerves that originally carried impulses from the missing limb. These nerves now seem to misfire and cause troubling pain.  Neuropathic pain often seems to have no cause. It responds poorly to standard pain treatment. Neuropathic pain can occur after:  Shingles (herpes zoster virus infection).  A lasting burning sensation of the skin, caused usually by injury to a peripheral nerve.  Peripheral neuropathy which is widespread nerve damage, often caused by diabetes or alcoholism.  Phantom limb pain following an amputation.  Facial nerve problems (trigeminal neuralgia).  Multiple sclerosis.  Reflex sympathetic dystrophy.  Pain which comes with cancer and cancer chemotherapy.  Entrapment neuropathy such as when pressure is put on a nerve such as in carpal tunnel syndrome.  Back, leg, and hip problems (sciatica).  Spine or back surgery.  HIV Infection or AIDS where nerves are infected by viruses. Your caregiver can explain items in the above list which may apply to you. SYMPTOMS  Characteristics of neuropathic pain are:  Severe, sharp, electric shock-like, shooting, lightening-like, knife-like.  Pins and needles sensation.  Deep burning, deep cold, or deep ache.  Persistent numbness, tingling, or weakness.  Pain resulting from light touch or other stimulus that would not usually cause pain.  Increased sensitivity to something that would normally cause pain, such as a pinprick. Pain may persist for months or years following the healing of damaged tissues. When this happens, pain signals  no longer sound an alarm about current injuries or injuries about to happen. Instead, the alarm system itself is not working correctly.  Neuropathic pain may get worse instead of better over  time. For some people, it can lead to serious disability. It is important to be aware that severe injury in a limb can occur without a proper, protective pain response.Burns, cuts, and other injuries may go unnoticed. Without proper treatment, these injuries can become infected or lead to further disability. Take any injury seriously, and consult your caregiver for treatment. DIAGNOSIS  When you have a pain with no known cause, your caregiver will probably ask some specific questions:   Do you have any other conditions, such as diabetes, shingles, multiple sclerosis, or HIV infection?  How would you describe your pain? (Neuropathic pain is often described as shooting, stabbing, burning, or searing.)  Is your pain worse at any time of the day? (Neuropathic pain is usually worse at night.)  Does the pain seem to follow a certain physical pathway?  Does the pain come from an area that has missing or injured nerves? (An example would be phantom limb pain.)  Is the pain triggered by minor things such as rubbing against the sheets at night? These questions often help define the type of pain involved. Once your caregiver knows what is happening, treatment can begin. Anticonvulsant, antidepressant drugs, and various pain relievers seem to work in some cases. If another condition, such as diabetes is involved, better management of that disorder may relieve the neuropathic pain.  TREATMENT  Neuropathic pain is frequently long-lasting and tends not to respond to treatment with narcotic type pain medication. It may respond well to other drugs such as antiseizure and antidepressant medications. Usually, neuropathic problems do not completely go away, but partial improvement is often possible with proper treatment. Your caregivers have large numbers of medications available to treat you. Do not be discouraged if you do not get immediate relief. Sometimes different medications or a combination of medications will  be tried before you receive the results you are hoping for. See your caregiver if you have pain that seems to be coming from nowhere and does not go away. Help is available.  SEEK IMMEDIATE MEDICAL CARE IF:   There is a sudden change in the quality of your pain, especially if the change is on only one side of the body.  You notice changes of the skin, such as redness, black or purple discoloration, swelling, or an ulcer.  You cannot move the affected limbs. Document Released: 10/15/2003 Document Revised: 04/11/2011 Document Reviewed: 10/15/2003 [facility] Patient Information 2015 [place], [place]. This information is not intended to replace advice given to you by your health care provider. Make sure you discuss any questions you have with your health care provider.

## 2013-07-31 ENCOUNTER — Telehealth: Payer: Self-pay | Admitting: Physician Assistant

## 2013-07-31 MED ORDER — ZOSTER VACCINE LIVE 19400 UNT/0.65ML ~~LOC~~ SOLR: 0.6500 mL | Freq: Once | SUBCUTANEOUS | Status: DC

## 2013-07-31 NOTE — Telephone Encounter (Signed)
Called and spoke with pt and pt is aware.  

## 2013-07-31 NOTE — Telephone Encounter (Signed)
Pt is returning [facility] call would like a call back

## 2013-07-31 NOTE — Telephone Encounter (Signed)
Returned call and spoke with pt about labs.

## 2013-07-31 NOTE — Telephone Encounter (Signed)
Pt states the shingles vaccination was sent in for her last year but she did not get it.  Pt request to have it sent to [facility] in [place].  Rx sent and labs mailed to home address.

## 2013-07-31 NOTE — Telephone Encounter (Signed)
Noted  

## 2013-08-05 ENCOUNTER — Encounter: Payer: Self-pay | Admitting: Internal Medicine

## 2013-08-05 DIAGNOSIS — E785 Hyperlipidemia, unspecified: Secondary | ICD-10-CM

## 2013-08-09 MED ORDER — ROSUVASTATIN CALCIUM 20 MG PO TABS: ORAL_TABLET | ORAL | Status: DC

## 2013-08-09 NOTE — Telephone Encounter (Signed)
Rx sent to pharmacy.  Pt wanted it sent to [facility]. Pt lives close to [facility]. Pt wanted to make sure Dr. Arnoldo Morale saw her mychart message from [phone].  Pt wanted to thank Dr. Arnoldo Morale for the many years.

## 2013-08-31 HISTORY — PX: CARPAL TUNNEL RELEASE: SHX101

## 2013-09-03 ENCOUNTER — Encounter: Payer: Self-pay | Admitting: Internal Medicine

## 2013-09-03 DIAGNOSIS — M810 Age-related osteoporosis without current pathological fracture: Secondary | ICD-10-CM

## 2013-09-04 ENCOUNTER — Telehealth: Payer: Self-pay

## 2013-09-04 ENCOUNTER — Other Ambulatory Visit: Payer: Self-pay | Admitting: Internal Medicine

## 2013-09-04 DIAGNOSIS — M810 Age-related osteoporosis without current pathological fracture: Secondary | ICD-10-CM

## 2013-09-04 DIAGNOSIS — I10 Essential (primary) hypertension: Secondary | ICD-10-CM

## 2013-09-04 NOTE — Telephone Encounter (Signed)
Per Ria Comment pt cannot be scheduled for Reclast until after 09/28/2013.  Ria Comment will call pt to make pt aware of that.  Our office needs to schedule pt for lab work prior to [employer]  Pt's labs are only good for 30 days. After we schedule pt for labs and fax the paperwork to short stay then pt will be scheduled.

## 2013-09-05 NOTE — Telephone Encounter (Signed)
Lab was scheduled, form was faxed back with appt date

## 2013-09-10 ENCOUNTER — Telehealth: Payer: Self-pay | Admitting: Internal Medicine

## 2013-09-10 ENCOUNTER — Encounter (HOSPITAL_COMMUNITY): Payer: Commercial Managed Care - HMO

## 2013-09-10 NOTE — Telephone Encounter (Signed)
Called pt to inform her that it has to be 366 days until she can another re clast , pt is  Not due until 09-28-2013 , she can only have this done once per year, because insurance will not pay  per [facility] short stay or I called pt on home and cell # left a msg for [t  to call the office regarding this awaiting a call back from the pt so I  can inform her of this .

## 2013-09-11 ENCOUNTER — Telehealth: Payer: Self-pay | Admitting: Physician Assistant

## 2013-09-11 NOTE — Telephone Encounter (Signed)
Per pt call want pt states she takes B12 1000 mcg and she mistakenly  Bought B-12 3000 mcg she wants to know if it is safe to take please advise . Patient use to see dr Benay Pillow .

## 2013-09-11 NOTE — Telephone Encounter (Signed)
B12 is water soluble, meaning any that is not used by the body would be excreted in the urine. Because of this, the increased dose from normal should not be a problem.

## 2013-09-11 NOTE — Telephone Encounter (Signed)
Called and spoke with pt and pt is aware.  

## 2013-09-19 ENCOUNTER — Other Ambulatory Visit (INDEPENDENT_AMBULATORY_CARE_PROVIDER_SITE_OTHER): Payer: Commercial Managed Care - HMO

## 2013-09-19 ENCOUNTER — Other Ambulatory Visit: Payer: Self-pay | Admitting: Physician Assistant

## 2013-09-19 DIAGNOSIS — E039 Hypothyroidism, unspecified: Secondary | ICD-10-CM

## 2013-09-19 DIAGNOSIS — I10 Essential (primary) hypertension: Secondary | ICD-10-CM

## 2013-09-19 DIAGNOSIS — M810 Age-related osteoporosis without current pathological fracture: Secondary | ICD-10-CM

## 2013-09-19 LAB — BASIC METABOLIC PANEL
BUN: 16 mg/dL (ref 6–23)
CALCIUM: 9.5 mg/dL (ref 8.4–10.5)
CO2: 30 mEq/L (ref 19–32)
Chloride: 104 mEq/L (ref 96–112)
Creatinine, Ser: 1.1 mg/dL (ref 0.4–1.2)
GFR: 50.57 mL/min — AB (ref >60.00)
Glucose, Bld: 96 mg/dL (ref 70–99)
Potassium: 4.1 mEq/L (ref 3.5–5.1)
SODIUM: 142 meq/L (ref 135–145)

## 2013-09-19 LAB — TSH: TSH: 0.18 u[IU]/mL — AB (ref 0.35–4.50)

## 2013-09-20 ENCOUNTER — Telehealth: Payer: Self-pay | Admitting: Internal Medicine

## 2013-09-20 NOTE — Telephone Encounter (Signed)
Relevant patient education assigned to patient using Emmi. ° °

## 2013-09-20 NOTE — Progress Notes (Signed)
Lab appt made for 10.21.2015

## 2013-10-04 ENCOUNTER — Encounter: Payer: Self-pay | Admitting: Internal Medicine

## 2013-10-11 ENCOUNTER — Telehealth: Payer: Self-pay | Admitting: Internal Medicine

## 2013-10-11 NOTE — Telephone Encounter (Signed)
Pt would like blood work results °

## 2013-10-11 NOTE — Telephone Encounter (Signed)
Pt saw Bebe Liter

## 2013-10-11 NOTE — Telephone Encounter (Signed)
Called and spoke with pt and advised that we went over her lab work on 8/.21 and pt states she remembers that.  Pt had the labs done for her reclast infusion.  Pt is ready to have that done.  Pls send paperwork.

## 2013-10-17 NOTE — Telephone Encounter (Signed)
Form filled out and faxed 

## 2013-10-18 ENCOUNTER — Ambulatory Visit (HOSPITAL_COMMUNITY)
Admission: RE | Admit: 2013-10-18 | Discharge: 2013-10-18 | Disposition: A | Discharge status: Home / Self Care | Payer: Medicare HMO | Source: Ambulatory Visit | Attending: Internal Medicine | Admitting: Internal Medicine

## 2013-10-18 ENCOUNTER — Encounter (HOSPITAL_COMMUNITY): Payer: Self-pay

## 2013-10-18 DIAGNOSIS — M810 Age-related osteoporosis without current pathological fracture: Secondary | ICD-10-CM | POA: Insufficient documentation

## 2013-10-18 MED ORDER — SODIUM CHLORIDE 0.9 % IV SOLN
Freq: Once | INTRAVENOUS | Status: AC
  Administered 2013-10-18: 15:00:00 via INTRAVENOUS

## 2013-10-18 MED ORDER — ZOLEDRONIC ACID 5 MG/100ML IV SOLN
5.0000 mg | Freq: Once | INTRAVENOUS | Status: AC
  Administered 2013-10-18: 5 mg via INTRAVENOUS
  Filled 2013-10-18: qty 100

## 2013-10-18 NOTE — Discharge Instructions (Signed)
Zoledronic Acid injection (Paget's Disease, Osteoporosis) °What is this medicine? °ZOLEDRONIC ACID (ZOE le dron ik AS id) lowers the amount of calcium loss from bone. It is used to treat Paget's disease and osteoporosis in women. °This medicine may be used for other purposes; ask your health care provider or pharmacist if you have questions. °COMMON BRAND NAME(S): Reclast, Zometa °What should I tell my health care provider before I take this medicine? °They need to know if you have any of these conditions: °-aspirin-sensitive asthma °-cancer, especially if you are receiving medicines used to treat cancer °-dental disease or wear dentures °-infection °-kidney disease °-low levels of calcium in the blood °-past surgery on the parathyroid gland or intestines °-receiving corticosteroids like dexamethasone or prednisone °-an unusual or allergic reaction to zoledronic acid, other medicines, foods, dyes, or preservatives °-pregnant or trying to get pregnant °-breast-feeding °How should I use this medicine? °This medicine is for infusion into a vein. It is given by a health care professional in a hospital or clinic setting. °Talk to your pediatrician regarding the use of this medicine in children. This medicine is not approved for use in children. °Overdosage: If you think you have taken too much of this medicine contact a poison control center or emergency room at once. °NOTE: This medicine is only for you. Do not share this medicine with others. °What if I miss a dose? °It is important not to miss your dose. Call your doctor or health care professional if you are unable to keep an appointment. °What may interact with this medicine? °-certain antibiotics given by injection °-NSAIDs, medicines for pain and inflammation, like ibuprofen or naproxen °-some diuretics like bumetanide, furosemide °-teriparatide °This list may not describe all possible interactions. Give your health care provider a list of all the medicines,  herbs, non-prescription drugs, or dietary supplements you use. Also tell them if you smoke, drink alcohol, or use illegal drugs. Some items may interact with your medicine. °What should I watch for while using this medicine? °Visit your doctor or health care professional for regular checkups. It may be some time before you see the benefit from this medicine. Do not stop taking your medicine unless your doctor tells you to. Your doctor may order blood tests or other tests to see how you are doing. °Women should inform their doctor if they wish to become pregnant or think they might be pregnant. There is a potential for serious side effects to an unborn child. Talk to your health care professional or pharmacist for more information. °You should make sure that you get enough calcium and vitamin D while you are taking this medicine. Discuss the foods you eat and the vitamins you take with your health care professional. °Some people who take this medicine have severe bone, joint, and/or muscle pain. This medicine may also increase your risk for jaw problems or a broken thigh bone. Tell your doctor right away if you have severe pain in your jaw, bones, joints, or muscles. Tell your doctor if you have any pain that does not go away or that gets worse. °Tell your dentist and dental surgeon that you are taking this medicine. You should not have major dental surgery while on this medicine. See your dentist to have a dental exam and fix any dental problems before starting this medicine. Take good care of your teeth while on this medicine. Make sure you see your dentist for regular follow-up appointments. °What side effects may I notice from receiving this medicine? °  Side effects that you should report to your doctor or health care professional as soon as possible: -allergic reactions like skin rash, itching or hives, swelling of the face, lips, or tongue -anxiety, confusion, or depression -breathing problems -changes in  vision -eye pain -feeling faint or lightheaded, falls -jaw pain, especially after dental work -mouth sores -muscle cramps, stiffness, or weakness -trouble passing urine or change in the amount of urine Side effects that usually do not require medical attention (report to your doctor or health care professional if they continue or are bothersome): -bone, joint, or muscle pain -constipation -diarrhea -fever -hair loss -irritation at site where injected -loss of appetite -nausea, vomiting -stomach upset -trouble sleeping -trouble swallowing -weak or tired This list may not describe all possible side effects. Call your doctor for medical advice about side effects. You may report side effects to FDA at 1-800-FDA-1088. Where should I keep my medicine? This drug is given in a hospital or clinic and will not be stored at home. NOTE: This sheet is a summary. It may not cover all possible information. If you have questions about this medicine, talk to your doctor, pharmacist, or health care provider.  2015, [employer]. (2012-07-02 10:03:48) Osteoporosis Throughout your life, your body breaks down old bone and replaces it with new bone. As you get older, your body does not replace bone as quickly as it breaks it down. By the age of 32 years, most people begin to gradually lose bone because of the imbalance between bone loss and replacement. Some people lose more bone than others. Bone loss beyond a specified normal degree is considered osteoporosis.  Osteoporosis affects the strength and durability of your bones. The inside of the ends of your bones and your flat bones, like the bones of your pelvis, look like honeycomb, filled with tiny open spaces. As bone loss occurs, your bones become less dense. This means that the open spaces inside your bones become bigger and the walls between these spaces become thinner. This makes your bones weaker. Bones of a person with osteoporosis can  become so weak that they can break (fracture) during minor accidents, such as a simple fall. CAUSES  The following factors have been associated with the development of osteoporosis:  Smoking.  Drinking more than 2 alcoholic drinks several days per week.  Long-term use of certain medicines:  Corticosteroids.  Chemotherapy medicines.  Thyroid medicines.  Antiepileptic medicines.  Gonadal hormone suppression medicine.  Immunosuppression medicine.  Being underweight.  Lack of physical activity.  Lack of exposure to the sun. This can lead to vitamin D deficiency.  Certain medical conditions:  Certain inflammatory bowel diseases, such as Crohn disease and ulcerative colitis.  Diabetes.  Hyperthyroidism.  Hyperparathyroidism. RISK FACTORS Anyone can develop osteoporosis. However, the following factors can increase your risk of developing osteoporosis:  Gender--Women are at higher risk than men.  Age--Being older than 50 years increases your risk.  Ethnicity--White and Asian people have an increased risk.  Weight --Being extremely underweight can increase your risk of osteoporosis.  Family history of osteoporosis--Having a family member who has developed osteoporosis can increase your risk. SYMPTOMS  Usually, people with osteoporosis have no symptoms.  DIAGNOSIS  Signs during a physical exam that may prompt your caregiver to suspect osteoporosis include:  Decreased height. This is usually caused by the compression of the bones that form your spine (vertebrae) because they have weakened and become fractured.  A curving or rounding of the upper back (kyphosis). To  confirm signs of osteoporosis, your caregiver may request a procedure that uses 2 low-dose X-ray beams with different levels of energy to measure your bone mineral density (dual-energy X-ray absorptiometry [DXA]). Also, your caregiver may check your level of vitamin D. TREATMENT  The goal of osteoporosis  treatment is to strengthen bones in order to decrease the risk of bone fractures. There are different types of medicines available to help achieve this goal. Some of these medicines work by slowing the processes of bone loss. Some medicines work by increasing bone density. Treatment also involves making sure that your levels of calcium and vitamin D are adequate. PREVENTION  There are things you can do to help prevent osteoporosis. Adequate intake of calcium and vitamin D can help you achieve optimal bone mineral density. Regular exercise can also help, especially resistance and weight-bearing activities. If you smoke, quitting smoking is an important part of osteoporosis prevention. MAKE SURE YOU:  Understand these instructions.  Will watch your condition.  Will get help right away if you are not doing well or get worse. FOR MORE INFORMATION www.osteo.org and [employer].ee Document Released: 10/27/2004 Document Revised: 05/14/2012 Document Reviewed: 01/01/2011 [facility] Patient Information 2015 [place], [place]. This information is not intended to replace advice given to you by your health care provider. Make sure you discuss any questions you have with your health care provider.

## 2013-11-15 ENCOUNTER — Other Ambulatory Visit: Payer: Self-pay

## 2013-11-15 ENCOUNTER — Ambulatory Visit: Payer: Medicare HMO

## 2013-11-18 ENCOUNTER — Encounter: Payer: Self-pay | Admitting: Physician Assistant

## 2013-11-20 ENCOUNTER — Other Ambulatory Visit (INDEPENDENT_AMBULATORY_CARE_PROVIDER_SITE_OTHER): Payer: Commercial Managed Care - HMO

## 2013-11-20 DIAGNOSIS — E039 Hypothyroidism, unspecified: Secondary | ICD-10-CM

## 2013-11-20 LAB — TSH: TSH: 0.28 u[IU]/mL — AB (ref 0.35–4.50)

## 2013-11-21 ENCOUNTER — Other Ambulatory Visit: Payer: Self-pay | Admitting: Family

## 2013-11-21 DIAGNOSIS — E039 Hypothyroidism, unspecified: Secondary | ICD-10-CM

## 2013-11-21 MED ORDER — LEVOTHYROXINE SODIUM 100 MCG PO TABS: 100.0000 ug | ORAL_TABLET | Freq: Every day | ORAL | Status: DC

## 2013-11-22 ENCOUNTER — Telehealth: Payer: Self-pay | Admitting: Internal Medicine

## 2013-11-22 DIAGNOSIS — E039 Hypothyroidism, unspecified: Secondary | ICD-10-CM

## 2013-11-22 MED ORDER — LEVOTHYROXINE SODIUM 100 MCG PO TABS: 100.0000 ug | ORAL_TABLET | Freq: Every day | ORAL | Status: DC

## 2013-11-22 NOTE — Telephone Encounter (Signed)
Pt needs her levothyroxine (SYNTHROID, LEVOTHROID) 100 MCG tablet Resent to [facility].  She does not go to that [facility]. (only got a shingle vac there)   Pt would also like to know why this med was changed. pls call on home phone. On mon am. Pt will be working at [employer] leaving 9 am Monday.

## 2013-11-22 NOTE — Telephone Encounter (Signed)
Left detailed message to advise pt of lab results and also to advise scheduling an appointment to establish with a provider  Rx sent to [facility]

## 2013-12-05 ENCOUNTER — Telehealth: Payer: Self-pay | Admitting: Internal Medicine

## 2013-12-05 DIAGNOSIS — E039 Hypothyroidism, unspecified: Secondary | ICD-10-CM

## 2013-12-05 NOTE — Telephone Encounter (Signed)
Pt is scheduled for TSH lab, can you please enter the order??

## 2013-12-05 NOTE — Telephone Encounter (Signed)
Done

## 2014-01-09 ENCOUNTER — Other Ambulatory Visit (INDEPENDENT_AMBULATORY_CARE_PROVIDER_SITE_OTHER): Payer: Commercial Managed Care - HMO

## 2014-01-09 DIAGNOSIS — E039 Hypothyroidism, unspecified: Secondary | ICD-10-CM

## 2014-01-09 LAB — TSH: TSH: 1.77 u[IU]/mL (ref 0.35–4.50)

## 2014-01-31 HISTORY — PX: COLONOSCOPY W/ BIOPSIES: SHX1374

## 2014-03-20 ENCOUNTER — Encounter: Payer: Self-pay | Admitting: Internal Medicine

## 2014-03-20 ENCOUNTER — Ambulatory Visit (INDEPENDENT_AMBULATORY_CARE_PROVIDER_SITE_OTHER): Payer: PPO | Admitting: Internal Medicine

## 2014-03-20 VITALS — BP 118/74 | HR 71 | Temp 98.2°F | Wt 162.0 lb

## 2014-03-20 DIAGNOSIS — E039 Hypothyroidism, unspecified: Secondary | ICD-10-CM

## 2014-03-20 DIAGNOSIS — T148 Other injury of unspecified body region: Secondary | ICD-10-CM

## 2014-03-20 DIAGNOSIS — M545 Low back pain, unspecified: Secondary | ICD-10-CM

## 2014-03-20 DIAGNOSIS — T148XXA Other injury of unspecified body region, initial encounter: Secondary | ICD-10-CM

## 2014-03-20 MED ORDER — LEVOTHYROXINE SODIUM 100 MCG PO TABS: 100.0000 ug | ORAL_TABLET | Freq: Every day | ORAL | Status: DC

## 2014-03-20 NOTE — Progress Notes (Signed)
Subjective:    Patient ID: Amy Lin, female    DOB: 01-11-1934, 81 y.o.   MRN: [id number]  HPI  Pt presents to the clinic today with c/o right lower back pain. She reports this started Thursday, after she had been bending over at the [place] for a few hours fixing flowers for her deceased family members for Valentines day. The pain initially was worse each day, but has actually improved from yesterday. The pain does not radiate down her leg. The pain does feel like it radiates to her abdomen. She reports it feels like a pulling sensation but also feels crampy at times. She denies dysuria or blood in her urine. Her bowels or moving normally. She has had a hysterectomy and appendectomy. She has not tried anything OTC.  Additionally, she also needs refills of her Synthroid refilled until she can establish care with Dr. Silvio Pate. Her lasTSH 12/2013 was WNL. She denies and s/s of hypothyroidism on her current dose of Synthroid. She is taking the medication as directed without side effects.  Review of Systems      Past Medical History  Diagnosis Date  . Osteoporosis   . Hyperlipidemia   . GERD (gastroesophageal reflux disease)   . Hypertension   . Thyroid disease   . Allergy   . History of carpal tunnel surgery of right wrist Sep 20, 2013    Current Outpatient Prescriptions  Medication Sig Dispense Refill  . aspirin 81 MG EC tablet Take 81 mg by mouth daily.      . calcium-vitamin D (OSCAL WITH D) 500-200 MG-UNIT per tablet Take 1 tablet by mouth daily.      . cetirizine (ZYRTEC) 10 MG tablet Take 10 mg by mouth daily.      . Cholecalciferol (VITAMIN D3) 2000 UNITS TABS Take by mouth.      . Cyanocobalamin (VITAMIN B 12 PO) Take by mouth daily.    . diclofenac sodium (VOLTAREN) 1 % GEL Apply 1 application topically 4 (four) times daily. 100 g 4  . esomeprazole (NEXIUM) 40 MG capsule Take 1 capsule (40 mg total) by mouth daily before breakfast. 90 capsule 3  .  HYDROcodone-acetaminophen (NORCO/VICODIN) 5-325 MG per tablet Take 1-2 tablets by mouth every 6 (six) hours as needed for moderate pain. 15 tablet 0  . levothyroxine (SYNTHROID, LEVOTHROID) 100 MCG tablet Take 1 tablet (100 mcg total) by mouth daily before breakfast. 30 tablet 3  . Magnesium 250 MG TABS Take 1 tablet by mouth daily.    . rosuvastatin (CRESTOR) 20 MG tablet Take 1 tab daily as directed by dr Arnoldo Morale 90 tablet 3  . triamterene-hydrochlorothiazide (MAXZIDE-25) 37.5-25 MG per tablet Take 2 tablets by mouth daily. 180 tablet 3  . Wheat Dextrin (BENEFIBER PO) Take 1 tablet by mouth 2 (two) times daily.      Marland Kitchen zoster vaccine live, PF, (ZOSTAVAX) 72094 UNT/0.65ML injection Inject 19,400 Units into the skin once. 1 each 0  . folic acid (FOLVITE) 1 MG tablet Take 1 tablet (1 mg total) by mouth daily. (Patient not taking: Reported on 03/20/2014) 90 tablet 3   No current facility-administered medications for this visit.    No Known Allergies  Family History  Problem Relation Age of Onset  . Heart disease Mother   . Stroke Father   . Hypertension Father   . Heart disease Father   . Diabetes Father     History   Social History  . Marital Status: Divorced  Spouse Name: N/A  . Number of Children: N/A  . Years of Education: N/A   Occupational History  . Not on file.   Social History Main Topics  . Smoking status: Never Smoker   . Smokeless tobacco: Never Used  . Alcohol Use: No  . Drug Use: No  . Sexual Activity: Yes   Other Topics Concern  . Not on file   Social History Narrative     Constitutional: Denies fever, malaise, fatigue, headache or abrupt weight changes.  HEENT: Denies eye pain, eye redness, ear pain, ringing in the ears, wax buildup, runny nose, nasal congestion, bloody nose, or sore throat. Respiratory: Denies difficulty breathing, shortness of breath, cough or sputum production.   Cardiovascular: Denies chest pain, chest tightness, palpitations or  swelling in the hands or feet.  Gastrointestinal: Denies abdominal pain, bloating, constipation, diarrhea or blood in the stool.  GU: Denies urgency, frequency, pain with urination, burning sensation, blood in urine, odor or discharge. Musculoskeletal: Pt reports low back pain. Denies difficulty with gait or joint pain and swelling.  Skin: Denies redness, rashes, lesions or ulcercations.  Neurological: Denies dizziness, difficulty with memory, difficulty with speech or problems with balance and coordination.   No other specific complaints in a complete review of systems (except as listed in HPI above).  Objective:   Physical Exam   BP 118/74 mmHg  Pulse 71  Temp(Src) 98.2 F (36.8 C) (Oral)  Wt 162 lb (73.483 kg)  SpO2 98%  Wt Readings from Last 3 Encounters:  03/20/14 162 lb (73.483 kg)  07/30/13 158 lb (71.668 kg)  06/02/13 160 lb (72.576 kg)    General: Appears her stated age, well developed, well nourished in NAD. Skin: Warm, dry and intact. No rashes, lesions or ulcerations noted. Neck: Neck supple, trachea midline. No masses, lumps or thyromegaly present.  Cardiovascular: Normal rate and rhythm. S1,S2 noted.  No murmur, rubs or gallops noted.  Pulmonary/Chest: Normal effort and positive vesicular breath sounds. No respiratory distress. No wheezes, rales or ronchi noted.  Abdomen: Soft and nontender. Normal bowel sounds, no bruits noted. No distention or masses noted. Liver, spleen and kidneys non palpable. Musculoskeletal: Decreased flexion and rotation to the left. Normal extension and rotation to the right. No pain with palpation of the thoracic and lumbar spine. She is kyphotic. Muscle tension noted of the right para lumbar area. No difficulty with gait. Normal internal and external rotation of right hip. No pain with SI joint or trochanteric bursa. Neurological: Alert and oriented. Negative straight leg raise on the right.  BMET    Component Value Date/Time   NA 142  09/19/2013 0949   K 4.1 09/19/2013 0949   CL 104 09/19/2013 0949   CO2 30 09/19/2013 0949   GLUCOSE 96 09/19/2013 0949   BUN 16 09/19/2013 0949   CREATININE 1.1 09/19/2013 0949   CALCIUM 9.5 09/19/2013 0949   GFRNONAA 68.59 12/04/2009 1131   GFRAA 91 03/12/2008 0928    Lipid Panel     Component Value Date/Time   CHOL 249* 07/30/2013 0848   TRIG 248.0* 07/30/2013 0848   TRIG 103 12/06/2005 1055   HDL 46.60 07/30/2013 0848   CHOLHDL 5 07/30/2013 0848   CHOLHDL 3.9 CALC 12/06/2005 1055   VLDL 49.6* 07/30/2013 0848   LDLCALC 153* 07/30/2013 0848    CBC    Component Value Date/Time   WBC 6.3 07/30/2013 0848   RBC 4.35 07/30/2013 0848   HGB 13.2 07/30/2013 0848   HCT  39.5 07/30/2013 0848   PLT 227.0 07/30/2013 0848   MCV 90.7 07/30/2013 0848   MCHC 33.5 07/30/2013 0848   RDW 13.3 07/30/2013 0848   LYMPHSABS 1.6 07/30/2013 0848   MONOABS 0.6 07/30/2013 0848   EOSABS 0.2 07/30/2013 0848   BASOSABS 0.0 07/30/2013 0848    Hgb A1C Lab Results  Component Value Date   HGBA1C 6.4 07/30/2013        Assessment & Plan:   Muscle strain of right lower back:  Advised her to try some Aleve OTC A heating pad may be helpful Stretching exercise given Advised her this would resolve with time, avoid bending over for long periods of time or lifting anything heavy for the next 2 weeks.  Hypothyroidism:  TSH reviewed Synthroid refilled x 2 months  RTC in 05/2014 for you new pt appt with Dr. Silvio Pate, sooner if needed

## 2014-03-20 NOTE — Patient Instructions (Signed)
Back Exercises These exercises may help you when beginning to rehabilitate your injury. Your symptoms may resolve with or without further involvement from your physician, physical therapist or athletic trainer. While completing these exercises, remember:   Restoring tissue flexibility helps normal motion to return to the joints. This allows healthier, less painful movement and activity.  An effective stretch should be held for at least 30 seconds.  A stretch should never be painful. You should only feel a gentle lengthening or release in the stretched tissue. STRETCH - Extension, Prone on Elbows   Lie on your stomach on the floor, a bed will be too soft. Place your palms about shoulder width apart and at the height of your head.  Place your elbows under your shoulders. If this is too painful, stack pillows under your chest.  Allow your body to relax so that your hips drop lower and make contact more completely with the floor.  Hold this position for __________ seconds.  Slowly return to lying flat on the floor. Repeat __________ times. Complete this exercise __________ times per day.  RANGE OF MOTION - Extension, Prone Press Ups   Lie on your stomach on the floor, a bed will be too soft. Place your palms about shoulder width apart and at the height of your head.  Keeping your back as relaxed as possible, slowly straighten your elbows while keeping your hips on the floor. You may adjust the placement of your hands to maximize your comfort. As you gain motion, your hands will come more underneath your shoulders.  Hold this position __________ seconds.  Slowly return to lying flat on the floor. Repeat __________ times. Complete this exercise __________ times per day.  RANGE OF MOTION- Quadruped, Neutral Spine   Assume a hands and knees position on a firm surface. Keep your hands under your shoulders and your knees under your hips. You may place padding under your knees for  comfort.  Drop your head and point your tail bone toward the ground below you. This will round out your low back like an angry cat. Hold this position for __________ seconds.  Slowly lift your head and release your tail bone so that your back sags into a large arch, like an old horse.  Hold this position for __________ seconds.  Repeat this until you feel limber in your low back.  Now, find your "sweet spot." This will be the most comfortable position somewhere between the two previous positions. This is your neutral spine. Once you have found this position, tense your stomach muscles to support your low back.  Hold this position for __________ seconds. Repeat __________ times. Complete this exercise __________ times per day.  STRETCH - Flexion, Single Knee to Chest   Lie on a firm bed or floor with both legs extended in front of you.  Keeping one leg in contact with the floor, bring your opposite knee to your chest. Hold your leg in place by either grabbing behind your thigh or at your knee.  Pull until you feel a gentle stretch in your low back. Hold __________ seconds.  Slowly release your grasp and repeat the exercise with the opposite side. Repeat __________ times. Complete this exercise __________ times per day.  STRETCH - Hamstrings, Standing  Stand or sit and extend your right / left leg, placing your foot on a chair or foot stool  Keeping a slight arch in your low back and your hips straight forward.  Lead with your chest and   lean forward at the waist until you feel a gentle stretch in the back of your right / left knee or thigh. (When done correctly, this exercise requires leaning only a small distance.)  Hold this position for __________ seconds. Repeat __________ times. Complete this stretch __________ times per day. STRENGTHENING - Deep Abdominals, Pelvic Tilt   Lie on a firm bed or floor. Keeping your legs in front of you, bend your knees so they are both pointed  toward the ceiling and your feet are flat on the floor.  Tense your lower abdominal muscles to press your low back into the floor. This motion will rotate your pelvis so that your tail bone is scooping upwards rather than pointing at your feet or into the floor.  With a gentle tension and even breathing, hold this position for __________ seconds. Repeat __________ times. Complete this exercise __________ times per day.  STRENGTHENING - Abdominals, Crunches   Lie on a firm bed or floor. Keeping your legs in front of you, bend your knees so they are both pointed toward the ceiling and your feet are flat on the floor. Cross your arms over your chest.  Slightly tip your chin down without bending your neck.  Tense your abdominals and slowly lift your trunk high enough to just clear your shoulder blades. Lifting higher can put excessive stress on the low back and does not further strengthen your abdominal muscles.  Control your return to the starting position. Repeat __________ times. Complete this exercise __________ times per day.  STRENGTHENING - Quadruped, Opposite UE/LE Lift   Assume a hands and knees position on a firm surface. Keep your hands under your shoulders and your knees under your hips. You may place padding under your knees for comfort.  Find your neutral spine and gently tense your abdominal muscles so that you can maintain this position. Your shoulders and hips should form a rectangle that is parallel with the floor and is not twisted.  Keeping your trunk steady, lift your right hand no higher than your shoulder and then your left leg no higher than your hip. Make sure you are not holding your breath. Hold this position __________ seconds.  Continuing to keep your abdominal muscles tense and your back steady, slowly return to your starting position. Repeat with the opposite arm and leg. Repeat __________ times. Complete this exercise __________ times per day. Document Released:  02/04/2005 Document Revised: 04/11/2011 Document Reviewed: 05/01/2008 Exit[employer] Patient Information 2015 [employer]. This information is not intended to replace advice given to you by your health care provider. Make sure you discuss any questions you have with your health care provider.  

## 2014-03-20 NOTE — Progress Notes (Signed)
Pre visit review using our clinic review tool, if applicable. No additional management support is needed unless otherwise documented below in the visit note. 

## 2014-05-07 ENCOUNTER — Ambulatory Visit (INDEPENDENT_AMBULATORY_CARE_PROVIDER_SITE_OTHER): Payer: PPO | Admitting: Internal Medicine

## 2014-05-07 ENCOUNTER — Encounter: Payer: Self-pay | Admitting: Internal Medicine

## 2014-05-07 VITALS — BP 130/78 | HR 79 | Temp 97.9°F | Ht 64.5 in | Wt 162.5 lb

## 2014-05-07 DIAGNOSIS — Z23 Encounter for immunization: Secondary | ICD-10-CM | POA: Diagnosis not present

## 2014-05-07 DIAGNOSIS — E785 Hyperlipidemia, unspecified: Secondary | ICD-10-CM

## 2014-05-07 DIAGNOSIS — E039 Hypothyroidism, unspecified: Secondary | ICD-10-CM

## 2014-05-07 DIAGNOSIS — M810 Age-related osteoporosis without current pathological fracture: Secondary | ICD-10-CM

## 2014-05-07 DIAGNOSIS — I10 Essential (primary) hypertension: Secondary | ICD-10-CM

## 2014-05-07 DIAGNOSIS — K219 Gastro-esophageal reflux disease without esophagitis: Secondary | ICD-10-CM

## 2014-05-07 LAB — CBC WITH DIFFERENTIAL/PLATELET
BASOS ABS: 0 10*3/uL (ref 0.0–0.1)
Basophils Relative: 0.5 % (ref 0.0–3.0)
Eosinophils Absolute: 0.3 10*3/uL (ref 0.0–0.7)
Eosinophils Relative: 3.4 % (ref 0.0–5.0)
HCT: 39 % (ref 36.0–46.0)
Hemoglobin: 13.4 g/dL (ref 12.0–15.0)
Lymphocytes Relative: 26.5 % (ref 12.0–46.0)
Lymphs Abs: 2 10*3/uL (ref 0.7–4.0)
MCHC: 34.4 g/dL (ref 30.0–36.0)
MCV: 88.2 fl (ref 78.0–100.0)
MONO ABS: 0.8 10*3/uL (ref 0.1–1.0)
MONOS PCT: 11.3 % (ref 3.0–12.0)
Neutro Abs: 4.4 10*3/uL (ref 1.4–7.7)
Neutrophils Relative %: 58.3 % (ref 43.0–77.0)
PLATELETS: 235 10*3/uL (ref 150.0–400.0)
RBC: 4.42 Mil/uL (ref 3.87–5.11)
RDW: 13.2 % (ref 11.5–15.5)
WBC: 7.5 10*3/uL (ref 4.0–10.5)

## 2014-05-07 LAB — COMPREHENSIVE METABOLIC PANEL
ALT: 13 U/L (ref 0–35)
AST: 18 U/L (ref 0–37)
Albumin: 4 g/dL (ref 3.5–5.2)
Alkaline Phosphatase: 64 U/L (ref 39–117)
BILIRUBIN TOTAL: 0.4 mg/dL (ref 0.2–1.2)
BUN: 21 mg/dL (ref 6–23)
CO2: 30 mEq/L (ref 19–32)
Calcium: 9.9 mg/dL (ref 8.4–10.5)
Chloride: 100 mEq/L (ref 96–112)
Creatinine, Ser: 1.14 mg/dL (ref 0.40–1.20)
GFR: 48.96 mL/min — ABNORMAL LOW (ref >60.00)
Glucose, Bld: 80 mg/dL (ref 70–99)
Potassium: 3.6 mEq/L (ref 3.5–5.1)
SODIUM: 137 meq/L (ref 135–145)
TOTAL PROTEIN: 7.5 g/dL (ref 6.0–8.3)

## 2014-05-07 LAB — T4, FREE: Free T4: 1.03 ng/dL (ref 0.60–1.60)

## 2014-05-07 LAB — LDL CHOLESTEROL, DIRECT: Direct LDL: 208 mg/dL

## 2014-05-07 LAB — LIPID PANEL
CHOLESTEROL: 324 mg/dL — AB (ref 0–200)
HDL: 43.7 mg/dL (ref >39.00)
NONHDL: 280.3
TRIGLYCERIDES: 326 mg/dL — AB (ref 0.0–149.0)
Total CHOL/HDL Ratio: 7
VLDL: 65.2 mg/dL — ABNORMAL HIGH (ref 0.0–40.0)

## 2014-05-07 LAB — TSH: TSH: 5.36 u[IU]/mL — ABNORMAL HIGH (ref 0.35–4.50)

## 2014-05-07 NOTE — Addendum Note (Signed)
Addended by: Carter Kitten on: 05/07/2014 12:43 PM   Modules accepted: Orders

## 2014-05-07 NOTE — Assessment & Plan Note (Signed)
Not on statin This is fine since it would only be primary prevention and she doesn't want it

## 2014-05-07 NOTE — Progress Notes (Signed)
Subjective:    Patient ID: Amy Lin, female    DOB: Jan 15, 1934, 81 y.o.   MRN: [id number]  HPI Here for transfer of care from Dr Arnoldo Morale Reviewed advanced directives No falls No depression or anhedonia Does yard and house work--no set exercise  Has had edema in past Some record of HTN--though she states that she is on the diuretic for edema No significant headaches--just gets sinus congestion Only chest pain seems to be reflux--not exertional No SOB--but did note some DOE pushing the lawnmower up a hill No dizziness or syncope No edema  Allergies can be bad Cetirizine some help advil cold and sinus gives additional help  Weight stable No problems with skin, hair or nails Normal energy levels  Does get some acid reflux symptoms at times Takes the nexium daily---now on the lower dose OTC dose No swallowing problems---or rarely mild issue during a meal  Has been off the statin for some time Comfortable not being on this  Current Outpatient Prescriptions on File Prior to Visit  Medication Sig Dispense Refill  . aspirin 81 MG EC tablet Take 81 mg by mouth daily.      . calcium-vitamin D (OSCAL WITH D) 500-200 MG-UNIT per tablet Take 1 tablet by mouth daily.      . cetirizine (ZYRTEC) 10 MG tablet Take 10 mg by mouth daily.      . Cholecalciferol (VITAMIN D3) 2000 UNITS TABS Take by mouth.      . Cyanocobalamin (VITAMIN B 12 PO) Take by mouth daily.    . diclofenac sodium (VOLTAREN) 1 % GEL Apply 1 application topically 4 (four) times daily. 100 g 4  . levothyroxine (SYNTHROID, LEVOTHROID) 100 MCG tablet Take 1 tablet (100 mcg total) by mouth daily before breakfast. 30 tablet 1  . Magnesium 250 MG TABS Take 1 tablet by mouth daily.    Marland Kitchen triamterene-hydrochlorothiazide (MAXZIDE-25) 37.5-25 MG per tablet Take 2 tablets by mouth daily. 366 tablet 3  . folic acid (FOLVITE) 1 MG tablet Take 1 tablet (1 mg total) by mouth daily. (Patient not taking: Reported on 03/20/2014) 90  tablet 3  . rosuvastatin (CRESTOR) 20 MG tablet Take 1 tab daily as directed by dr Arnoldo Morale (Patient not taking: Reported on 05/07/2014) 90 tablet 3  . Wheat Dextrin (BENEFIBER PO) Take 1 tablet by mouth 2 (two) times daily.      Marland Kitchen zoster vaccine live, PF, (ZOSTAVAX) 44034 UNT/0.65ML injection Inject 19,400 Units into the skin once. (Patient not taking: Reported on 05/07/2014) 1 each 0   No current facility-administered medications on file prior to visit.    No Known Allergies  Past Medical History  Diagnosis Date  . Osteoporosis   . Hyperlipidemia   . GERD (gastroesophageal reflux disease)   . Hypertension     diuretic for edema at first  . Hypothyroidism   . Allergy   . Osteoarthritis, multiple sites     Past Surgical History  Procedure Laterality Date  . Abdominal hysterectomy    . Carpal tunnel release  8/15  . Back surgery  1999    Discectomy and Ray cage  . Meniscectomy Left 2/15    Dr Veverly Fells    Family History  Problem Relation Age of Onset  . Heart disease Mother   . Stroke Father   . Hypertension Father   . Heart disease Father   . Diabetes Father     History   Social History  . Marital Status: Divorced  Spouse Name: N/A  . Number of Children: 3  . Years of Education: N/A   Occupational History  . Office supervisor     [employer]   Social History Main Topics  . Smoking status: Never Smoker   . Smokeless tobacco: Never Used  . Alcohol Use: No  . Drug Use: No  . Sexual Activity: Yes   Other Topics Concern  . Not on file   Social History Narrative   3 children--- 1 died   Lives alone      No living will   Requests son and daughter as health care POAs   Would accept resuscitation attempts but no prolonged artificial ventilation   Not sure about tube feeds   Review of Systems Sleeps is not as good as in the past--more trouble initiating  Bowels are fine Voids okay-- does get more urgency at times in AM Used to get some cramps in her  legs--better since on the magnesium    Objective:   Physical Exam  Constitutional: She appears well-developed and well-nourished. No distress.  HENT:  Mouth/Throat: Oropharynx is clear and moist. No oropharyngeal exudate.  Neck: Normal range of motion. Neck supple. No thyromegaly present.  Cardiovascular: Normal rate, regular rhythm, normal heart sounds and intact distal pulses.  Exam reveals no gallop.   No murmur heard. Pulmonary/Chest: Effort normal and breath sounds normal. No respiratory distress. She has no wheezes. She has no rales.  Abdominal: Soft. There is no tenderness.  Musculoskeletal: She exhibits no edema or tenderness.  Thickening in right foot  Lymphadenopathy:    She has no cervical adenopathy.  Skin: No rash noted.  Psychiatric: She has a normal mood and affect. Her behavior is normal.          Assessment & Plan:

## 2014-05-07 NOTE — Progress Notes (Signed)
Pre visit review using our clinic review tool, if applicable. No additional management support is needed unless otherwise documented below in the visit note. 

## 2014-05-07 NOTE — Assessment & Plan Note (Signed)
BP Readings from Last 3 Encounters:  05/07/14 130/78  03/20/14 118/74  07/30/13 110/72   Diuretic mostly for past edema Will continue

## 2014-05-07 NOTE — Assessment & Plan Note (Signed)
Seems to be euthyroid Will recheck labs

## 2014-05-07 NOTE — Assessment & Plan Note (Signed)
Last DEXA just osteopenia Will stop the injections

## 2014-05-07 NOTE — Assessment & Plan Note (Signed)
Generally controlled May need to take 20 of the OTC nexium if symptoms flare

## 2014-05-20 ENCOUNTER — Encounter: Payer: Self-pay | Admitting: Internal Medicine

## 2014-05-20 DIAGNOSIS — E039 Hypothyroidism, unspecified: Secondary | ICD-10-CM

## 2014-05-20 MED ORDER — LEVOTHYROXINE SODIUM 100 MCG PO TABS: 100.0000 ug | ORAL_TABLET | Freq: Every day | ORAL | Status: DC

## 2014-05-20 MED ORDER — TRIAMTERENE-HCTZ 37.5-25 MG PO TABS: 2.0000 | ORAL_TABLET | Freq: Every day | ORAL | Status: DC

## 2014-05-27 ENCOUNTER — Encounter: Payer: Self-pay | Admitting: Internal Medicine

## 2014-07-04 ENCOUNTER — Encounter: Payer: Self-pay | Admitting: Internal Medicine

## 2014-07-04 ENCOUNTER — Ambulatory Visit (INDEPENDENT_AMBULATORY_CARE_PROVIDER_SITE_OTHER): Payer: PPO | Admitting: Internal Medicine

## 2014-07-04 VITALS — BP 126/72 | HR 79 | Temp 97.9°F | Wt 159.0 lb

## 2014-07-04 DIAGNOSIS — W57XXXA Bitten or stung by nonvenomous insect and other nonvenomous arthropods, initial encounter: Secondary | ICD-10-CM | POA: Diagnosis not present

## 2014-07-04 DIAGNOSIS — N949 Unspecified condition associated with female genital organs and menstrual cycle: Secondary | ICD-10-CM

## 2014-07-04 DIAGNOSIS — T148 Other injury of unspecified body region: Secondary | ICD-10-CM

## 2014-07-04 NOTE — Patient Instructions (Signed)
Tick Bite Information Ticks are insects that attach themselves to the skin and draw blood for food. There are various types of ticks. Common types include wood ticks and deer ticks. Most ticks live in shrubs and grassy areas. Ticks can climb onto your body when you make contact with leaves or grass where the tick is waiting. The most common places on the body for ticks to attach themselves are the scalp, neck, armpits, waist, and groin. Most tick bites are harmless, but sometimes ticks carry germs that cause diseases. These germs can be spread to a person during the tick's feeding process. The chance of a disease spreading through a tick bite depends on:   The type of tick.  Time of year.   How long the tick is attached.   Geographic location.  HOW CAN YOU PREVENT TICK BITES? Take these steps to help prevent tick bites when you are outdoors:  Wear protective clothing. Long sleeves and long pants are best.   Wear white clothes so you can see ticks more easily.  Tuck your pant legs into your socks.   If walking on a trail, stay in the middle of the trail to avoid brushing against bushes.  Avoid walking through areas with long grass.  Put insect repellent on all exposed skin and along boot tops, pant legs, and sleeve cuffs.   Check clothing, hair, and skin repeatedly and before going inside.   Brush off any ticks that are not attached.  Take a shower or bath as soon as possible after being outdoors.  WHAT IS THE PROPER WAY TO REMOVE A TICK? Ticks should be removed as soon as possible to help prevent diseases caused by tick bites. 1. If latex gloves are available, put them on before trying to remove a tick.  2. Using fine-point tweezers, grasp the tick as close to the skin as possible. You may also use curved forceps or a tick removal tool. Grasp the tick as close to its head as possible. Avoid grasping the tick on its body. 3. Pull gently with steady upward pressure until  the tick lets go. Do not twist the tick or jerk it suddenly. This may break off the tick's head or mouth parts. 4. Do not squeeze or crush the tick's body. This could force disease-carrying fluids from the tick into your body.  5. After the tick is removed, wash the bite area and your hands with soap and water or other disinfectant such as alcohol. 6. Apply a small amount of antiseptic cream or ointment to the bite site.  7. Wash and disinfect any instruments that were used.  Do not try to remove a tick by applying a hot match, petroleum jelly, or fingernail polish to the tick. These methods do not work and may increase the chances of disease being spread from the tick bite.  WHEN SHOULD YOU SEEK MEDICAL CARE? Contact your health care provider if you are unable to remove a tick from your skin or if a part of the tick breaks off and is stuck in the skin.  After a tick bite, you need to be aware of signs and symptoms that could be related to diseases spread by ticks. Contact your health care provider if you develop any of the following in the days or weeks after the tick bite:  Unexplained fever.  Rash. A circular rash that appears days or weeks after the tick bite may indicate the possibility of Lyme disease. The rash may resemble   a target with a bull's-eye and may occur at a different part of your body than the tick bite.  Redness and swelling in the area of the tick bite.   Tender, swollen lymph glands.   Diarrhea.   Weight loss.   Cough.   Fatigue.   Muscle, joint, or bone pain.   Abdominal pain.   Headache.   Lethargy or a change in your level of consciousness.  Difficulty walking or moving your legs.   Numbness in the legs.   Paralysis.  Shortness of breath.   Confusion.   Repeated vomiting.  Document Released: 01/15/2000 Document Revised: 11/07/2012 Document Reviewed: 06/27/2012 Exit[employer] Patient Information 2015 [employer]. This information is  not intended to replace advice given to you by your health care provider. Make sure you discuss any questions you have with your health care provider.  

## 2014-07-04 NOTE — Progress Notes (Signed)
Subjective:    Patient ID: Amy Lin, female    DOB: 1934-01-16, 81 y.o.   MRN: [id number]  HPI  Pt presents to the clinic today with c/o pulling 3 ticks off her this week. One was located on her abdomen. Another one was on her wrist. She pulled another one off her clothes. She does not know how long the ticks were on there. The ticks were not engorged. The places she pulled the ticks off are red and itchy. She has not tried anything OTC. She denies confusion, joint pain, rash or diarrhea.  She also c/o a bump near her vagina. She noticed this yesterday. It is white/grey. It is not tender or painful. She has never noticed anything like this before. She would like me to check it out for her.  Review of Systems      Past Medical History  Diagnosis Date  . Osteoporosis   . Hyperlipidemia   . GERD (gastroesophageal reflux disease)   . Hypertension     diuretic for edema at first  . Hypothyroidism   . Allergy   . Osteoarthritis, multiple sites     Current Outpatient Prescriptions  Medication Sig Dispense Refill  . aspirin 81 MG EC tablet Take 81 mg by mouth daily.      . calcium-vitamin D (OSCAL WITH D) 500-200 MG-UNIT per tablet Take 1 tablet by mouth daily.      . cetirizine (ZYRTEC) 10 MG tablet Take 10 mg by mouth daily.      . Cholecalciferol (VITAMIN D3) 2000 UNITS TABS Take by mouth.      . Cyanocobalamin (VITAMIN B 12 PO) Take by mouth daily.    . diclofenac sodium (VOLTAREN) 1 % GEL Apply 1 application topically 4 (four) times daily. 100 g 4  . esomeprazole (NEXIUM) 20 MG capsule Take 20 mg by mouth daily at 12 noon.    Marland Kitchen levothyroxine (SYNTHROID, LEVOTHROID) 100 MCG tablet Take 1 tablet (100 mcg total) by mouth daily before breakfast. 90 tablet 3  . Magnesium 250 MG TABS Take 1 tablet by mouth daily.    Marland Kitchen triamterene-hydrochlorothiazide (MAXZIDE-25) 37.5-25 MG per tablet Take 2 tablets by mouth daily. 180 tablet 3   No current facility-administered medications for  this visit.    No Known Allergies  Family History  Problem Relation Age of Onset  . Heart disease Mother   . Stroke Father   . Hypertension Father   . Heart disease Father   . Diabetes Father     History   Social History  . Marital Status: Divorced    Spouse Name: N/A  . Number of Children: 3  . Years of Education: N/A   Occupational History  . Office supervisor     [employer]   Social History Main Topics  . Smoking status: Never Smoker   . Smokeless tobacco: Never Used  . Alcohol Use: No  . Drug Use: No  . Sexual Activity: Yes   Other Topics Concern  . Not on file   Social History Narrative   3 children--- 1 died   Lives alone      No living will   Requests son and daughter as health care POAs   Would accept resuscitation attempts but no prolonged artificial ventilation   Not sure about tube feeds     Constitutional: Denies fever, malaise, fatigue, headache or abrupt weight changes.  Respiratory: Denies difficulty breathing, shortness of breath, cough or sputum production.  Cardiovascular: Denies chest pain, chest tightness, palpitations or swelling in the hands or feet.  Gastrointestinal: Denies abdominal pain, bloating, constipation, diarrhea or blood in the stool.  Skin: Pt reports tick bite and bump near vagina. Denies rashes, or ulcercations.  Neurological: Denies dizziness, difficulty with memory, difficulty with speech or problems with balance and coordination.   No other specific complaints in a complete review of systems (except as listed in HPI above).  Objective:   Physical Exam   BP 126/72 mmHg  Pulse 79  Temp(Src) 97.9 F (36.6 C) (Oral)  Wt 159 lb (72.122 kg)  SpO2 98% Wt Readings from Last 3 Encounters:  07/04/14 159 lb (72.122 kg)  05/07/14 162 lb 8 oz (73.71 kg)  03/20/14 162 lb (73.483 kg)    General: Appears her stated age, well developed, well nourished in NAD. Skin: Warm, dry and intact. Small tick bite noted to right  wrist and central abdomen. Local reactions noted, no s/s of infection. I do not see any bump near her vagina. Cardiovascular: Normal rate and rhythm. S1,S2 noted.  No murmur, rubs or gallops noted.  Pulmonary/Chest: Normal effort and positive vesicular breath sounds. No respiratory distress. No wheezes, rales or ronchi noted.  Pelvic: Normal female anatomy. She has hemangiomas in the perineal area. Cystocele noted. Neurological: Alert and oriented.   BMET    Component Value Date/Time   NA 137 05/07/2014 1244   K 3.6 05/07/2014 1244   CL 100 05/07/2014 1244   CO2 30 05/07/2014 1244   GLUCOSE 80 05/07/2014 1244   BUN 21 05/07/2014 1244   CREATININE 1.14 05/07/2014 1244   CALCIUM 9.9 05/07/2014 1244   GFRNONAA 68.59 12/04/2009 1131   GFRAA 91 03/12/2008 0928    Lipid Panel     Component Value Date/Time   CHOL 324* 05/07/2014 1244   TRIG 326.0* 05/07/2014 1244   TRIG 103 12/06/2005 1055   HDL 43.70 05/07/2014 1244   CHOLHDL 7 05/07/2014 1244   CHOLHDL 3.9 CALC 12/06/2005 1055   VLDL 65.2* 05/07/2014 1244   LDLCALC 153* 07/30/2013 0848    CBC    Component Value Date/Time   WBC 7.5 05/07/2014 1244   RBC 4.42 05/07/2014 1244   HGB 13.4 05/07/2014 1244   HCT 39.0 05/07/2014 1244   PLT 235.0 05/07/2014 1244   MCV 88.2 05/07/2014 1244   MCHC 34.4 05/07/2014 1244   RDW 13.2 05/07/2014 1244   LYMPHSABS 2.0 05/07/2014 1244   MONOABS 0.8 05/07/2014 1244   EOSABS 0.3 05/07/2014 1244   BASOSABS 0.0 05/07/2014 1244    Hgb A1C Lab Results  Component Value Date   HGBA1C 6.4 07/30/2013        Assessment & Plan:   Tick bites of wrist and abdomen:  Local reaction only No s/s of infection ABX not indicated Advised her to use Hydrocortisone cream as needed  Bump near vagina:  Exam is normal She will reassess this at home  RTC as needed or if symptoms persist or worsen

## 2014-07-04 NOTE — Progress Notes (Signed)
Pre visit review using our clinic review tool, if applicable. No additional management support is needed unless otherwise documented below in the visit note. 

## 2014-07-21 ENCOUNTER — Encounter: Payer: Self-pay | Admitting: Internal Medicine

## 2014-09-29 ENCOUNTER — Telehealth: Payer: Self-pay | Admitting: Internal Medicine

## 2014-09-29 NOTE — Telephone Encounter (Signed)
Last bone density 03/04/2011 @ [facility]

## 2014-09-29 NOTE — Telephone Encounter (Signed)
Have her make appt with me in oct for 6 month followup, we can get bone density exam then.

## 2014-09-29 NOTE — Telephone Encounter (Signed)
Spoke with patient and she didn't understand why I was calling her because she is [facility] patient. I advised that both dr's must be notified and per pt she said she told regina and that was enough notification.

## 2014-09-29 NOTE — Telephone Encounter (Signed)
Will defer to Dr. Silvio Pate  It looks like she established with him 4/016 Please change me as her PCP

## 2014-09-29 NOTE — Telephone Encounter (Signed)
Patient called to get an order for her Bone Density Test.  Patient said it's been over 2 years since her last test and patient has been getting infusions.  Patient said she hasn't had the bone density since she started the infusions. Patient can't remember where she had her last bone density done.

## 2014-09-29 NOTE — Telephone Encounter (Signed)
I had told her that the last bone density showed only osteopenia and that we should stop the injections. I am not sure we really need to recheck since it was pretty good in 2013

## 2014-09-30 NOTE — Telephone Encounter (Signed)
Pt has an appt 11/06/2014 for 6 mth f/u

## 2014-11-06 ENCOUNTER — Ambulatory Visit (INDEPENDENT_AMBULATORY_CARE_PROVIDER_SITE_OTHER): Payer: PPO | Admitting: Internal Medicine

## 2014-11-06 ENCOUNTER — Encounter: Payer: Self-pay | Admitting: Internal Medicine

## 2014-11-06 VITALS — BP 142/84 | HR 70 | Temp 97.9°F | Wt 157.0 lb

## 2014-11-06 DIAGNOSIS — M810 Age-related osteoporosis without current pathological fracture: Secondary | ICD-10-CM

## 2014-11-06 DIAGNOSIS — J302 Other seasonal allergic rhinitis: Secondary | ICD-10-CM

## 2014-11-06 DIAGNOSIS — E559 Vitamin D deficiency, unspecified: Secondary | ICD-10-CM | POA: Diagnosis not present

## 2014-11-06 DIAGNOSIS — I10 Essential (primary) hypertension: Secondary | ICD-10-CM | POA: Diagnosis not present

## 2014-11-06 DIAGNOSIS — K219 Gastro-esophageal reflux disease without esophagitis: Secondary | ICD-10-CM

## 2014-11-06 DIAGNOSIS — R7303 Prediabetes: Secondary | ICD-10-CM

## 2014-11-06 DIAGNOSIS — E039 Hypothyroidism, unspecified: Secondary | ICD-10-CM | POA: Diagnosis not present

## 2014-11-06 DIAGNOSIS — Z23 Encounter for immunization: Secondary | ICD-10-CM | POA: Diagnosis not present

## 2014-11-06 DIAGNOSIS — E785 Hyperlipidemia, unspecified: Secondary | ICD-10-CM | POA: Diagnosis not present

## 2014-11-06 LAB — CBC
HCT: 38.1 % (ref 36.0–46.0)
Hemoglobin: 12.8 g/dL (ref 12.0–15.0)
MCHC: 33.5 g/dL (ref 30.0–36.0)
MCV: 90.6 fl (ref 78.0–100.0)
PLATELETS: 251 10*3/uL (ref 150.0–400.0)
RBC: 4.2 Mil/uL (ref 3.87–5.11)
RDW: 13.3 % (ref 11.5–15.5)
WBC: 5.1 10*3/uL (ref 4.0–10.5)

## 2014-11-06 LAB — VITAMIN D 25 HYDROXY (VIT D DEFICIENCY, FRACTURES): VITD: 44.8 ng/mL (ref 30.00–100.00)

## 2014-11-06 LAB — TSH: TSH: 1.24 u[IU]/mL (ref 0.35–4.50)

## 2014-11-06 LAB — COMPREHENSIVE METABOLIC PANEL
ALBUMIN: 3.8 g/dL (ref 3.5–5.2)
ALK PHOS: 62 U/L (ref 39–117)
ALT: 10 U/L (ref 0–35)
AST: 15 U/L (ref 0–37)
BILIRUBIN TOTAL: 0.4 mg/dL (ref 0.2–1.2)
BUN: 19 mg/dL (ref 6–23)
CALCIUM: 9.8 mg/dL (ref 8.4–10.5)
CO2: 29 mEq/L (ref 19–32)
Chloride: 102 mEq/L (ref 96–112)
Creatinine, Ser: 1.27 mg/dL — ABNORMAL HIGH (ref 0.40–1.20)
GFR: 43.17 mL/min — ABNORMAL LOW (ref >60.00)
Glucose, Bld: 96 mg/dL (ref 70–99)
Potassium: 3.7 mEq/L (ref 3.5–5.1)
Sodium: 139 mEq/L (ref 135–145)
Total Protein: 7.4 g/dL (ref 6.0–8.3)

## 2014-11-06 LAB — HEMOGLOBIN A1C: HEMOGLOBIN A1C: 6.4 % (ref 4.6–6.5)

## 2014-11-06 LAB — LIPID PANEL
CHOL/HDL RATIO: 6
CHOLESTEROL: 251 mg/dL — AB (ref 0–200)
HDL: 39.4 mg/dL (ref >39.00)
NonHDL: 211.34
Triglycerides: 216 mg/dL — ABNORMAL HIGH (ref 0.0–149.0)
VLDL: 43.2 mg/dL — ABNORMAL HIGH (ref 0.0–40.0)

## 2014-11-06 LAB — LDL CHOLESTEROL, DIRECT: Direct LDL: 156 mg/dL

## 2014-11-06 LAB — T4, FREE: Free T4: 0.96 ng/dL (ref 0.60–1.60)

## 2014-11-06 NOTE — Assessment & Plan Note (Signed)
Will check A1C today.

## 2014-11-06 NOTE — Assessment & Plan Note (Signed)
Will check Vit D today Continue Calcium and Vit D supplement unless directed otherwise Repeat DEXA next year

## 2014-11-06 NOTE — Progress Notes (Signed)
Pre visit review using our clinic review tool, if applicable. No additional management support is needed unless otherwise documented below in the visit note. 

## 2014-11-06 NOTE — Assessment & Plan Note (Signed)
Continue Zyrtec prn.

## 2014-11-06 NOTE — Progress Notes (Signed)
Subjective:    Patient ID: Amy Lin, female    DOB: 03-22-1933, 81 y.o.   MRN: [id number]  HPI  Pt presents to the clinic today for 6 month follow up of chronic conditions.  Allergic Rhinitis: Seasonal. She takes Zyrtec only when her allergies flare up.  HTN: She takes Triamterene-HCT daily but reports she has not taken it this morning. Her BP today is 142/84. She does occasionally have swelling in her lower legs. She denies chest pain or shortness of breath. There is no ECG in the system for viewing.  GERD: She denies breakthrough symptoms on Nexium.  HLD: Her last LDL was 208. She reports Dr. Silvio Pate took her off her cholesterol medication. She reports she did tolerate it well when she did take it. She has tried to control this with a low fat diet but reports she has been unsuccessful. She does take an ASA daily.  Hypothyroidism: She last had her labs checked 05/2014. She does feel more fatigued but denies depression, cold intolerance, constipation or weight gain. She is taking Synthroid daily as prescribed.  Osteoperosis: Her last bone density from 03/2012 reviewed. It revealed osteopenia. She does take Calcium and Vit D supplement. She reports she has been on medication for osteoporosis in the past.  Prediabetes: Her last A1C was 6.4% 07/2013. She denies increased thirst or frequent urination. She denies nonhealing wounds. She has noticed some numbness in the toes of her left foot. This occurs intermittently and does not interfere with her ability to walk.   Review of Systems  Past Medical History  Diagnosis Date  . Osteoporosis   . Hyperlipidemia   . GERD (gastroesophageal reflux disease)   . Hypertension     diuretic for edema at first  . Hypothyroidism   . Allergy   . Osteoarthritis, multiple sites     Current Outpatient Prescriptions  Medication Sig Dispense Refill  . aspirin 81 MG EC tablet Take 81 mg by mouth daily.      . calcium-vitamin D (OSCAL WITH D) 500-200  MG-UNIT per tablet Take 1 tablet by mouth daily.      . cetirizine (ZYRTEC) 10 MG tablet Take 10 mg by mouth daily.      . Cholecalciferol (VITAMIN D3) 2000 UNITS TABS Take by mouth.      . Cyanocobalamin (VITAMIN B 12 PO) Take by mouth daily.    . diclofenac sodium (VOLTAREN) 1 % GEL Apply 1 application topically 4 (four) times daily. 100 g 4  . esomeprazole (NEXIUM) 20 MG capsule Take 20 mg by mouth daily at 12 noon.    Marland Kitchen levothyroxine (SYNTHROID, LEVOTHROID) 100 MCG tablet Take 1 tablet (100 mcg total) by mouth daily before breakfast. 90 tablet 3  . Magnesium 250 MG TABS Take 1 tablet by mouth daily.    Marland Kitchen triamterene-hydrochlorothiazide (MAXZIDE-25) 37.5-25 MG per tablet Take 2 tablets by mouth daily. 180 tablet 3   No current facility-administered medications for this visit.    No Known Allergies  Family History  Problem Relation Age of Onset  . Heart disease Mother   . Stroke Father   . Hypertension Father   . Heart disease Father   . Diabetes Father     Social History   Social History  . Marital Status: Divorced    Spouse Name: N/A  . Number of Children: 3  . Years of Education: N/A   Occupational History  . Audiological scientist     [employer]  Social History Main Topics  . Smoking status: Never Smoker   . Smokeless tobacco: Never Used  . Alcohol Use: No  . Drug Use: No  . Sexual Activity: Yes   Other Topics Concern  . Not on file   Social History Narrative   3 children--- 1 died   Lives alone      No living will   Requests son and daughter as health care POAs   Would accept resuscitation attempts but no prolonged artificial ventilation   Not sure about tube feeds     Constitutional: Pt reports fatigue. Denies fever, malaise, fatigue, or abrupt weight changes.  HEENT: Denies eye pain, eye redness, ear pain, ringing in the ears, wax buildup, runny nose, nasal congestion, bloody nose, or sore throat. Respiratory: Denies difficulty breathing, shortness of  breath, cough or sputum production.   Cardiovascular: Denies chest pain, chest tightness, palpitations or swelling in the hands or feet.  Gastrointestinal: Denies abdominal pain, bloating, constipation, diarrhea or blood in the stool.  Musculoskeletal: Denies decrease in range of motion, difficulty with gait, muscle pain or joint pain and swelling.  Skin: Denies redness, rashes, lesions or ulcercations.  Neurological: Denies dizziness, difficulty with memory, difficulty with speech or problems with balance and coordination.  Psych: Denies anxiety, depression, SI/HI.  No other specific complaints in a complete review of systems (except as listed in HPI above).     Objective:   Physical Exam   BP 142/84 mmHg  Pulse 70  Temp(Src) 97.9 F (36.6 C) (Oral)  Wt 157 lb (71.215 kg)  SpO2 97% Wt Readings from Last 3 Encounters:  11/06/14 157 lb (71.215 kg)  07/04/14 159 lb (72.122 kg)  05/07/14 162 lb 8 oz (73.71 kg)    General: Appears her stated age, well developed, well nourished in NAD. Skin: Warm, dry and intact. No rashes, lesions or ulcerations noted. HEENT: Head: normal shape and size; Eyes: sclera white, no icterus, conjunctiva pink, PERRLA and EOMs intact; Ears: Tm's gray and intact, normal light reflex;  Neck:  No thyromegaly noted. Cardiovascular: Normal rate and rhythm. S1,S2 noted.  No murmur, rubs or gallops noted. 2+ right pedal pulse, unable to palpate left pedal pulse Pulmonary/Chest: Normal effort and positive vesicular breath sounds. No respiratory distress. No wheezes, rales or ronchi noted.  Abdomen: Soft and nontender. Normal bowel sounds, no bruits noted. No distention or masses noted. Liver, spleen and kidneys non palpable. Neurological: Alert and oriented.  Psychiatric: Mood and affect normal.   BMET    Component Value Date/Time   NA 137 05/07/2014 1244   K 3.6 05/07/2014 1244   CL 100 05/07/2014 1244   CO2 30 05/07/2014 1244   GLUCOSE 80 05/07/2014 1244    BUN 21 05/07/2014 1244   CREATININE 1.14 05/07/2014 1244   CALCIUM 9.9 05/07/2014 1244   GFRNONAA 68.59 12/04/2009 1131   GFRAA 91 03/12/2008 0928    Lipid Panel     Component Value Date/Time   CHOL 324* 05/07/2014 1244   TRIG 326.0* 05/07/2014 1244   TRIG 103 12/06/2005 1055   HDL 43.70 05/07/2014 1244   CHOLHDL 7 05/07/2014 1244   CHOLHDL 3.9 CALC 12/06/2005 1055   VLDL 65.2* 05/07/2014 1244   LDLCALC 153* 07/30/2013 0848    CBC    Component Value Date/Time   WBC 7.5 05/07/2014 1244   RBC 4.42 05/07/2014 1244   HGB 13.4 05/07/2014 1244   HCT 39.0 05/07/2014 1244   PLT 235.0 05/07/2014 1244  MCV 88.2 05/07/2014 1244   MCHC 34.4 05/07/2014 1244   RDW 13.2 05/07/2014 1244   LYMPHSABS 2.0 05/07/2014 1244   MONOABS 0.8 05/07/2014 1244   EOSABS 0.3 05/07/2014 1244   BASOSABS 0.0 05/07/2014 1244    Hgb A1C Lab Results  Component Value Date   HGBA1C 6.4 07/30/2013       Assessment & Plan:   Vit D deficiency:  Will check Vit D level today

## 2014-11-06 NOTE — Assessment & Plan Note (Signed)
Will check TSH and T4 today Will adjust Synthroid if needed based on lab values

## 2014-11-06 NOTE — Assessment & Plan Note (Signed)
Will repeat Lipid Profile today If LDL not at goal, consider restarting statin Continue ASA daily

## 2014-11-06 NOTE — Patient Instructions (Signed)

## 2014-11-06 NOTE — Progress Notes (Deleted)
   Subjective:    Patient ID: Amy Lin, female    DOB: July 14, 1933, 81 y.o.   MRN: [id number]  HPI Amy Lin is a 81 year old female who presents today for follow up.  1. Hypertension- Takes triamterene-HCTZ.  No chest pain, palpitations.  Her blood pressure today is 142/84, but she has not taken her medications yet.   2. Hypothyroidism- Takes levothyroxine. Feels like she has low energy.  3. Hyperlipemia - Eats healthy diet, fruits, veggies and lean meats.  4. GERD- No symptoms with Nexium.      Review of Systems  Constitutional: Negative for fever, chills and fatigue.  HENT: Negative.   Respiratory: Negative for cough, shortness of breath and wheezing.   Cardiovascular: Negative for chest pain, palpitations and leg swelling.  Gastrointestinal: Negative for abdominal pain, diarrhea and constipation.  Genitourinary: Negative for dysuria, urgency and flank pain.  Musculoskeletal: Negative for joint swelling, arthralgias and gait problem.       Left foot feels numb and has tingling in the morning when she wakes up  Skin: Negative.   Neurological: Negative for dizziness, light-headedness and headaches.  Psychiatric/Behavioral: The patient is not nervous/anxious.        Objective:   Physical Exam  Constitutional: She is oriented to person, place, and time. She appears well-developed and well-nourished.  HENT:  Head: Normocephalic and atraumatic.  Neck: Normal range of motion. Neck supple. No thyromegaly present.  Cardiovascular: Normal rate, regular rhythm and normal heart sounds.   No murmur heard. 2+ pulse right pedal, 1+ left pedal   Pulmonary/Chest: Effort normal and breath sounds normal. She has no wheezes.  Abdominal: Soft. Bowel sounds are normal.  Musculoskeletal: Normal range of motion.  Lymphadenopathy:    She has no cervical adenopathy.  Neurological: She is alert and oriented to person, place, and time.  Skin: Skin is warm and dry.          Assessment &  Plan:  1. Hypertension  Continue meds.  Will get BMP today.   2. Hyperlipidemia Will recheck lipids today, will most likely need to restarted on her Crestor.   3. Hypothyroidism TSH slightly elevated 3 months ago, will repeat and increase Synthroid.   4. GERD Continue Nexium

## 2014-11-06 NOTE — Assessment & Plan Note (Signed)
Controlled on Nexium CMET today

## 2014-11-06 NOTE — Assessment & Plan Note (Signed)
Advised her of the importance of taking her medications before her OV Will discuss ECG at next visit Continue Triamterene-HCT CBC and CMET today

## 2014-11-07 MED ORDER — ROSUVASTATIN CALCIUM 10 MG PO TABS: 10.0000 mg | ORAL_TABLET | Freq: Every day | ORAL | Status: DC

## 2014-11-07 NOTE — Addendum Note (Signed)
Addended by: Lurlean Nanny on: 11/07/2014 09:54 AM   Modules accepted: Orders

## 2014-11-19 ENCOUNTER — Encounter: Payer: Self-pay | Admitting: Internal Medicine

## 2014-11-19 ENCOUNTER — Other Ambulatory Visit: Payer: Self-pay | Admitting: Internal Medicine

## 2014-11-19 DIAGNOSIS — E785 Hyperlipidemia, unspecified: Secondary | ICD-10-CM

## 2014-11-19 MED ORDER — ROSUVASTATIN CALCIUM 10 MG PO TABS: 10.0000 mg | ORAL_TABLET | Freq: Every day | ORAL | Status: DC

## 2014-11-21 ENCOUNTER — Other Ambulatory Visit (INDEPENDENT_AMBULATORY_CARE_PROVIDER_SITE_OTHER): Payer: PPO

## 2014-11-21 DIAGNOSIS — E785 Hyperlipidemia, unspecified: Secondary | ICD-10-CM | POA: Diagnosis not present

## 2014-11-21 LAB — BASIC METABOLIC PANEL WITH GFR
BUN: 20 mg/dL (ref 6–23)
CO2: 29 meq/L (ref 19–32)
Calcium: 9.7 mg/dL (ref 8.4–10.5)
Chloride: 102 meq/L (ref 96–112)
Creatinine, Ser: 1.3 mg/dL — ABNORMAL HIGH (ref 0.40–1.20)
GFR: 42.02 mL/min — ABNORMAL LOW
Glucose, Bld: 110 mg/dL — ABNORMAL HIGH (ref 70–99)
Potassium: 3.6 meq/L (ref 3.5–5.1)
Sodium: 138 meq/L (ref 135–145)

## 2014-11-24 ENCOUNTER — Telehealth: Payer: Self-pay | Admitting: Internal Medicine

## 2014-11-24 NOTE — Telephone Encounter (Signed)
Patient returned Amy Lin's call.  I notified patient of her lab results and she scheduled appointment on 11/25/14.

## 2014-11-25 ENCOUNTER — Encounter: Payer: Self-pay | Admitting: Internal Medicine

## 2014-11-25 ENCOUNTER — Ambulatory Visit (INDEPENDENT_AMBULATORY_CARE_PROVIDER_SITE_OTHER): Payer: PPO | Admitting: Internal Medicine

## 2014-11-25 VITALS — BP 124/76 | HR 74 | Temp 97.6°F | Wt 160.0 lb

## 2014-11-25 DIAGNOSIS — I10 Essential (primary) hypertension: Secondary | ICD-10-CM

## 2014-11-25 DIAGNOSIS — R7989 Other specified abnormal findings of blood chemistry: Secondary | ICD-10-CM

## 2014-11-25 DIAGNOSIS — R748 Abnormal levels of other serum enzymes: Secondary | ICD-10-CM | POA: Diagnosis not present

## 2014-11-25 NOTE — Progress Notes (Signed)
Subjective:    Patient ID: Amy Lin, female    DOB: [birth year], 81 y.o.   MRN: [id number]  HPI   Pt presents today to follow up on her renal function labs. Her creatinine in April of 2016 was 1.14 with a GFR of 48.96.  In October of 2016, her Cr was 1.27 and she was instructed to drink plenty fluids and follow up in 2 weeks.  The most recent Cr is 1.30 and GFR is 42.02.  She is on Triamterene - HCT for her blood pressure.  Her blood pressure is well controlled.  Today her BP is 124/76. She also states that she doesn't drink fluids very often.  She says yesterday she drank about 18 ounces,and some days doesn't drink any water. She denies having dark urine.    Review of Systems    Past Medical History  Diagnosis Date  . Osteoporosis   . Hyperlipidemia   . GERD (gastroesophageal reflux disease)   . Hypertension     diuretic for edema at first  . Hypothyroidism   . Allergy   . Osteoarthritis, multiple sites     Current Outpatient Prescriptions  Medication Sig Dispense Refill  . aspirin 81 MG EC tablet Take 81 mg by mouth daily.      . calcium-vitamin D (OSCAL WITH D) 500-200 MG-UNIT per tablet Take 1 tablet by mouth daily.      . cetirizine (ZYRTEC) 10 MG tablet Take 10 mg by mouth daily.      . Cholecalciferol (VITAMIN D3) 2000 UNITS TABS Take by mouth.      . Cyanocobalamin (VITAMIN B 12 PO) Take by mouth daily.    Marland Kitchen esomeprazole (NEXIUM) 20 MG capsule Take 20 mg by mouth daily at 12 noon.    Marland Kitchen levothyroxine (SYNTHROID, LEVOTHROID) 100 MCG tablet Take 1 tablet (100 mcg total) by mouth daily before breakfast. 90 tablet 3  . Magnesium 250 MG TABS Take 1 tablet by mouth daily.    . rosuvastatin (CRESTOR) 10 MG tablet Take 1 tablet (10 mg total) by mouth daily. 30 tablet 2   No current facility-administered medications for this visit.    No Known Allergies  Family History  Problem Relation Age of Onset  . Heart disease Mother   . Stroke Father   . Hypertension Father     . Heart disease Father   . Diabetes Father     Social History   Social History  . Marital Status: Divorced    Spouse Name: N/A  . Number of Children: 3  . Years of Education: N/A   Occupational History  . Office supervisor     [employer]   Social History Main Topics  . Smoking status: Never Smoker   . Smokeless tobacco: Never Used  . Alcohol Use: No  . Drug Use: No  . Sexual Activity: Yes   Other Topics Concern  . Not on file   Social History Narrative   3 children--- 1 died   Lives alone      No living will   Requests son and daughter as health care POAs   Would accept resuscitation attempts but no prolonged artificial ventilation   Not sure about tube feeds     Constitutional: Denies fever, malaise, fatigue, headache or abrupt weight changes.  Respiratory: Denies difficulty breathing, shortness of breath, cough or sputum production.   Cardiovascular: Denies chest pain, chest tightness, palpitations or swelling in the hands or feet.  Gastrointestinal: Denies  abdominal pain, bloating, constipation, diarrhea or blood in the stool.  GU: Denies urgency, frequency, pain with urination, burning sensation, blood in urine, odor or discharge. Neurological: Denies dizziness, difficulty with memory, difficulty with speech or problems with balance and coordination.    No other specific complaints in a complete review of systems (except as listed in HPI above).      Objective:   Physical Exam   BP 124/76 mmHg  Pulse 74  Temp(Src) 97.6 F (36.4 C) (Oral)  Wt 160 lb (72.576 kg)  SpO2 97% Wt Readings from Last 3 Encounters:  11/25/14 160 lb (72.576 kg)  11/06/14 157 lb (71.215 kg)  07/04/14 159 lb (72.122 kg)    General: Appears her stated age, well developed, well nourished in NAD. Skin: Warm, dry and intact. No tenting noted. Cardiovascular: Normal rate and rhythm. S1,S2 noted.   Pulmonary/Chest: Normal effort and positive vesicular breath sounds. No  respiratory distress. No wheezes, rales or ronchi noted.  Neurological: Alert and oriented.    BMET    Component Value Date/Time   NA 138 11/21/2014 0928   K 3.6 11/21/2014 0928   CL 102 11/21/2014 0928   CO2 29 11/21/2014 0928   GLUCOSE 110* 11/21/2014 0928   BUN 20 11/21/2014 0928   CREATININE 1.30* 11/21/2014 0928   CALCIUM 9.7 11/21/2014 0928   GFRNONAA 68.59 12/04/2009 1131   GFRAA 91 03/12/2008 0928    Lipid Panel     Component Value Date/Time   CHOL 251* 11/06/2014 0932   TRIG 216.0* 11/06/2014 0932   TRIG 103 12/06/2005 1055   HDL 39.40 11/06/2014 0932   CHOLHDL 6 11/06/2014 0932   CHOLHDL 3.9 CALC 12/06/2005 1055   VLDL 43.2* 11/06/2014 0932   LDLCALC 153* 07/30/2013 0848    CBC    Component Value Date/Time   WBC 5.1 11/06/2014 0932   RBC 4.20 11/06/2014 0932   HGB 12.8 11/06/2014 0932   HCT 38.1 11/06/2014 0932   PLT 251.0 11/06/2014 0932   MCV 90.6 11/06/2014 0932   MCHC 33.5 11/06/2014 0932   RDW 13.3 11/06/2014 0932   LYMPHSABS 2.0 05/07/2014 1244   MONOABS 0.8 05/07/2014 1244   EOSABS 0.3 05/07/2014 1244   BASOSABS 0.0 05/07/2014 1244    Hgb A1C Lab Results  Component Value Date   HGBA1C 6.4 11/06/2014         Assessment & Plan:   Elevated creatinine secondary to diuretic use:  Instructed patient to increase fluid intake.  Will d/c Triamterene-HCT.  She wants to hold off on BP medication at this time, to see if she truly needs it. Will recheck BP in 2 weeks and re draw BMP to assess.   If BP is >140/90, will start Lisinopril   RTC in 2 weeks to followup HTN/renal funciton

## 2014-11-25 NOTE — Patient Instructions (Signed)
Serum Creatinine Test WHY AM I HAVING THIS TEST? A creatinine test is performed to measure the amount of creatinine in your blood (serum). Creatinine is a waste product of normal muscle activity (contraction). The kidneys filter creatinine from your blood and remove it from your body through urination. Blood creatinine levels stay consistent in people whose muscle mass stays consistent. This level can be increased in people who perform resistance exercise to increase muscle mass. Because creatinine is removed from your body by the kidneys, this test is often used as a way to measure kidney function. Your health care provider may recommend this test if he or she suspects that you have a condition that is negatively affecting your kidney function. This test may also be done as a part of routine blood work used to assess your overall health. WHAT KIND OF SAMPLE IS TAKEN? A blood sample is required for this test. It is usually collected by inserting a needle into a vein or by sticking a finger with a small needle. For children, the blood sample is usually collected by sticking the child's heel with a small needle. HOW DO I PREPARE FOR THE TEST? There is no preparation or fasting required for this test. WHAT ARE THE REFERENCE RANGES? Reference ranges are considered healthy ranges established after testing a large group of healthy people. Reference ranges may vary among different people, labs, and hospitals. It is your responsibility to obtain your test results. Ask the lab or department performing the test when and how you will get your results.   Children or adolescents:  Less than 54 years old: 0.1-0.4 mg/dL.  55-53 years old: 0.2-0.5 mg/dL.  47-36 years old: 0.3-0.6 mg/dL.  48-23 years old: 0.4-1 mg/dL.  Adult female:  75-17 years old: 0.5-1 mg/dL.  61-47 years old: 0.5-1.1 mg/dL.  61 years and older: 0.5-1.2 mg/dL.  Adult female:  62-69 years old: 0.6-1.2 mg/dL.  46-61 years old: 0.6-1.3  mg/dL.  61 years and older: 0.7-1.3 mg/dL. The reference range may be higher in people who perform resistance exercise to increase muscle mass. WHAT DO THE RESULTS MEAN? Abnormally high levels of serum creatinine can be caused by many health conditions. These may include:  Kidney disease.  Urinary tract obstruction.  Lower-than-normal blood flow to the kidneys.  Rhabdomyolysis. This occurs when muscle damage causes the release of molecules into the bloodstream, which results in kidney damage.  Acromegaly. This is a condition that causes enlarged bones.  Gigantism. Abnormally low levels of serum creatinine can also be caused by many health conditions. These may include:  Conditions that cause you to be inactive throughout much of the day.  Decreased muscle mass. Talk with your health care provider to discuss your results, treatment options, and if necessary, the need for more tests. Talk with your health care provider if you have any questions about your results.   This information is not intended to replace advice given to you by your health care provider. Make sure you discuss any questions you have with your health care provider.   Document Released: 02/10/2004 Document Revised: 02/07/2014 Document Reviewed: 06/13/2013 Elsevier Interactive Patient Education [employer].

## 2014-11-25 NOTE — Progress Notes (Signed)
Pre visit review using our clinic review tool, if applicable. No additional management support is needed unless otherwise documented below in the visit note. 

## 2014-12-11 ENCOUNTER — Ambulatory Visit (INDEPENDENT_AMBULATORY_CARE_PROVIDER_SITE_OTHER): Payer: PPO | Admitting: Internal Medicine

## 2014-12-11 ENCOUNTER — Encounter: Payer: Self-pay | Admitting: Internal Medicine

## 2014-12-11 VITALS — BP 122/72 | HR 78 | Temp 98.2°F | Wt 161.0 lb

## 2014-12-11 DIAGNOSIS — R7989 Other specified abnormal findings of blood chemistry: Secondary | ICD-10-CM

## 2014-12-11 DIAGNOSIS — I872 Venous insufficiency (chronic) (peripheral): Secondary | ICD-10-CM

## 2014-12-11 DIAGNOSIS — I10 Essential (primary) hypertension: Secondary | ICD-10-CM

## 2014-12-11 DIAGNOSIS — R748 Abnormal levels of other serum enzymes: Secondary | ICD-10-CM

## 2014-12-11 LAB — BASIC METABOLIC PANEL
BUN: 15 mg/dL (ref 6–23)
CALCIUM: 9.1 mg/dL (ref 8.4–10.5)
CO2: 26 mEq/L (ref 19–32)
Chloride: 106 mEq/L (ref 96–112)
Creatinine, Ser: 0.91 mg/dL (ref 0.40–1.20)
GFR: 63.4 mL/min (ref >60.00)
Glucose, Bld: 108 mg/dL — ABNORMAL HIGH (ref 70–99)
POTASSIUM: 3.8 meq/L (ref 3.5–5.1)
SODIUM: 140 meq/L (ref 135–145)

## 2014-12-11 NOTE — Addendum Note (Signed)
Addended by: Daralene Milch C on: 12/11/2014 02:54 PM   Modules accepted: Miquel Dunn

## 2014-12-11 NOTE — Progress Notes (Signed)
Subjective:    Patient ID: Amy Lin, female    DOB: [birth year], 81 y.o.   MRN: [id number]  HPI  Pt presents to the clinic today for 2 week follow up of HTN/impaired renal function. I wanted to switch her Triamterene-HCT d/t creatinine of 1.3. She wanted to try stopping BP medication completely to see how her BP did. Her BP today is 122/72. She checks her BP at home, she reports it runs [id number] systolic. She has noticed swelling in her ankles. The swelling is not painful. She reports it is there when she wakes up in the morning. She does not elevate her legs or wear compression hose.  Review of Systems      Past Medical History  Diagnosis Date  . Osteoporosis   . Hyperlipidemia   . GERD (gastroesophageal reflux disease)   . Hypertension     diuretic for edema at first  . Hypothyroidism   . Allergy   . Osteoarthritis, multiple sites     Current Outpatient Prescriptions  Medication Sig Dispense Refill  . aspirin 81 MG EC tablet Take 81 mg by mouth daily.      . calcium-vitamin D (OSCAL WITH D) 500-200 MG-UNIT per tablet Take 1 tablet by mouth daily.      . cetirizine (ZYRTEC) 10 MG tablet Take 10 mg by mouth daily.      . Cholecalciferol (VITAMIN D3) 2000 UNITS TABS Take by mouth.      . Cyanocobalamin (VITAMIN B 12 PO) Take by mouth daily.    Marland Kitchen esomeprazole (NEXIUM) 20 MG capsule Take 20 mg by mouth daily at 12 noon.    Marland Kitchen levothyroxine (SYNTHROID, LEVOTHROID) 100 MCG tablet Take 1 tablet (100 mcg total) by mouth daily before breakfast. 90 tablet 3  . Magnesium 250 MG TABS Take 1 tablet by mouth daily.    . rosuvastatin (CRESTOR) 10 MG tablet Take 1 tablet (10 mg total) by mouth daily. 30 tablet 2   No current facility-administered medications for this visit.    No Known Allergies  Family History  Problem Relation Age of Onset  . Heart disease Mother   . Stroke Father   . Hypertension Father   . Heart disease Father   . Diabetes Father     Social History   Social  History  . Marital Status: Divorced    Spouse Name: N/A  . Number of Children: 3  . Years of Education: N/A   Occupational History  . Office supervisor     [employer]   Social History Main Topics  . Smoking status: Never Smoker   . Smokeless tobacco: Never Used  . Alcohol Use: No  . Drug Use: No  . Sexual Activity: Yes   Other Topics Concern  . Not on file   Social History Narrative   3 children--- 1 died   Lives alone      No living will   Requests son and daughter as health care POAs   Would accept resuscitation attempts but no prolonged artificial ventilation   Not sure about tube feeds     Constitutional: Denies fever, malaise, fatigue, headache or abrupt weight changes.  Respiratory: Denies difficulty breathing, shortness of breath, cough or sputum production.   Cardiovascular: Pt reports swelling in her legs. Denies chest pain, chest tightness, palpitations or swelling in the hands.  Neurological: Denies dizziness, difficulty with memory, difficulty with speech or problems with balance and coordination.    No other specific complaints  in a complete review of systems (except as listed in HPI above).  Objective:   Physical Exam BP 122/72 mmHg  Pulse 78  Temp(Src) 98.2 F (36.8 C) (Oral)  Wt 161 lb (73.029 kg)  SpO2 98% Wt Readings from Last 3 Encounters:  12/11/14 161 lb (73.029 kg)  11/25/14 160 lb (72.576 kg)  11/06/14 157 lb (71.215 kg)    General: Appears her stated age, well developed, well nourished in NAD. Cardiovascular: Normal rate and rhythm. S1,S2 noted.  No murmur, rubs or gallops noted. 2+ pitting edema noted BLE. Pulmonary/Chest: Normal effort and positive vesicular breath sounds. No respiratory distress. No wheezes, rales or ronchi noted.  Neurological: Alert and oriented.    BMET    Component Value Date/Time   NA 138 11/21/2014 0928   K 3.6 11/21/2014 0928   CL 102 11/21/2014 0928   CO2 29 11/21/2014 0928   GLUCOSE 110* 11/21/2014  0928   BUN 20 11/21/2014 0928   CREATININE 1.30* 11/21/2014 0928   CALCIUM 9.7 11/21/2014 0928   GFRNONAA 68.59 12/04/2009 1131   GFRAA 91 03/12/2008 0928    Lipid Panel     Component Value Date/Time   CHOL 251* 11/06/2014 0932   TRIG 216.0* 11/06/2014 0932   TRIG 103 12/06/2005 1055   HDL 39.40 11/06/2014 0932   CHOLHDL 6 11/06/2014 0932   CHOLHDL 3.9 CALC 12/06/2005 1055   VLDL 43.2* 11/06/2014 0932   LDLCALC 153* 07/30/2013 0848    CBC    Component Value Date/Time   WBC 5.1 11/06/2014 0932   RBC 4.20 11/06/2014 0932   HGB 12.8 11/06/2014 0932   HCT 38.1 11/06/2014 0932   PLT 251.0 11/06/2014 0932   MCV 90.6 11/06/2014 0932   MCHC 33.5 11/06/2014 0932   RDW 13.3 11/06/2014 0932   LYMPHSABS 2.0 05/07/2014 1244   MONOABS 0.8 05/07/2014 1244   EOSABS 0.3 05/07/2014 1244   BASOSABS 0.0 05/07/2014 1244    Hgb A1C Lab Results  Component Value Date   HGBA1C 6.4 11/06/2014          Assessment & Plan:   HTN/elevated creatinine:  BP fine off meds Will repeat BMET today  Venous insufficiency:  Advised her to elevated her legs Wear TEDS during the day, off a night (she has these at home) If creatinine normal, can give low dose Lasix to take prn Consider referral to Vascular for further evaluation  RTC as needed or if symptoms persist or worsen

## 2014-12-11 NOTE — Progress Notes (Signed)
   Subjective:    Patient ID: Amy Lin, female    DOB: 05/06/1933, 81 y.o.   MRN: [id number]  HPI Amy Lin is a 81 year old female who presents for follow up. She had elevated creatinine of 1.27 and she was taken off her Triamterene - hctz. Her blood pressure today is 122/72 today. She takes her BP at home and her pressures have been 115-120 SBP.  She does report some increased swelling in her legs now.    Review of Systems  Constitutional: Negative for fever, chills and fatigue.  HENT: Negative.   Respiratory: Negative for cough, shortness of breath and wheezing.   Cardiovascular: Positive for leg swelling. Negative for chest pain and palpitations.  Genitourinary: Negative for dysuria, frequency and flank pain.  Neurological: Negative.    Family History  Problem Relation Age of Onset  . Heart disease Mother   . Stroke Father   . Hypertension Father   . Heart disease Father   . Diabetes Father    Current Outpatient Prescriptions on File Prior to Visit  Medication Sig Dispense Refill  . aspirin 81 MG EC tablet Take 81 mg by mouth daily.      . calcium-vitamin D (OSCAL WITH D) 500-200 MG-UNIT per tablet Take 1 tablet by mouth daily.      . cetirizine (ZYRTEC) 10 MG tablet Take 10 mg by mouth daily.      . Cholecalciferol (VITAMIN D3) 2000 UNITS TABS Take by mouth.      . Cyanocobalamin (VITAMIN B 12 PO) Take by mouth daily.    Marland Kitchen esomeprazole (NEXIUM) 20 MG capsule Take 20 mg by mouth daily at 12 noon.    Marland Kitchen levothyroxine (SYNTHROID, LEVOTHROID) 100 MCG tablet Take 1 tablet (100 mcg total) by mouth daily before breakfast. 90 tablet 3  . Magnesium 250 MG TABS Take 1 tablet by mouth daily.    . rosuvastatin (CRESTOR) 10 MG tablet Take 1 tablet (10 mg total) by mouth daily. 30 tablet 2   No current facility-administered medications on file prior to visit.       Objective:   Physical Exam  Constitutional: She is oriented to person, place, and time. She appears well-developed and  well-nourished.  HENT:  Head: Normocephalic and atraumatic.  Right Ear: External ear normal.  Left Ear: External ear normal.  Eyes: Pupils are equal, round, and reactive to light.  Neck: Normal range of motion. Neck supple.  Cardiovascular: Normal rate, regular rhythm, normal heart sounds and intact distal pulses.   No murmur heard. Pulmonary/Chest: Effort normal and breath sounds normal.  Abdominal: Soft. Bowel sounds are normal.  Musculoskeletal: She exhibits edema (+2 in BLE).  Neurological: She is alert and oriented to person, place, and time.  Skin: Skin is warm and dry.  Psychiatric: She has a normal mood and affect.    BP 122/72 mmHg  Pulse 78  Temp(Src) 98.2 F (36.8 C) (Oral)  Wt 161 lb (73.029 kg)  SpO2 98%      Assessment & Plan:  1. Elevated creatinine Stay off Triamterene - hctz.  Will get BMP today.  2. Bilateral LE swelling Encouraged patient to keep legs elevated and wear TED hose. If renal function is normal, can rx for prn lasix.

## 2014-12-11 NOTE — Patient Instructions (Signed)
Serum Creatinine Test WHY AM I HAVING THIS TEST? A creatinine test is performed to measure the amount of creatinine in your blood (serum). Creatinine is a waste product of normal muscle activity (contraction). The kidneys filter creatinine from your blood and remove it from your body through urination. Blood creatinine levels stay consistent in people whose muscle mass stays consistent. This level can be increased in people who perform resistance exercise to increase muscle mass. Because creatinine is removed from your body by the kidneys, this test is often used as a way to measure kidney function. Your health care provider may recommend this test if he or she suspects that you have a condition that is negatively affecting your kidney function. This test may also be done as a part of routine blood work used to assess your overall health. WHAT KIND OF SAMPLE IS TAKEN? A blood sample is required for this test. It is usually collected by inserting a needle into a vein or by sticking a finger with a small needle. For children, the blood sample is usually collected by sticking the child's heel with a small needle. HOW DO I PREPARE FOR THE TEST? There is no preparation or fasting required for this test. WHAT ARE THE REFERENCE RANGES? Reference ranges are considered healthy ranges established after testing a large group of healthy people. Reference ranges may vary among different people, labs, and hospitals. It is your responsibility to obtain your test results. Ask the lab or department performing the test when and how you will get your results.   Children or adolescents:  Less than 2 years old: 0.1-0.4 mg/dL.  2-5 years old: 0.2-0.5 mg/dL.  6-9 years old: 0.3-0.6 mg/dL.  10-17 years old: 0.4-1 mg/dL.  Adult female:  18-40 years old: 0.5-1 mg/dL.  41-60 years old: 0.5-1.1 mg/dL.  61 years and older: 0.5-1.2 mg/dL.  Adult female:  18-40 years old: 0.6-1.2 mg/dL.  41-60 years old: 0.6-1.3  mg/dL.  61 years and older: 0.7-1.3 mg/dL. The reference range may be higher in people who perform resistance exercise to increase muscle mass. WHAT DO THE RESULTS MEAN? Abnormally high levels of serum creatinine can be caused by many health conditions. These may include:  Kidney disease.  Urinary tract obstruction.  Lower-than-normal blood flow to the kidneys.  Rhabdomyolysis. This occurs when muscle damage causes the release of molecules into the bloodstream, which results in kidney damage.  Acromegaly. This is a condition that causes enlarged bones.  Gigantism. Abnormally low levels of serum creatinine can also be caused by many health conditions. These may include:  Conditions that cause you to be inactive throughout much of the day.  Decreased muscle mass. Talk with your health care provider to discuss your results, treatment options, and if necessary, the need for more tests. Talk with your health care provider if you have any questions about your results.   This information is not intended to replace advice given to you by your health care provider. Make sure you discuss any questions you have with your health care provider.   Document Released: 02/10/2004 Document Revised: 02/07/2014 Document Reviewed: 06/13/2013 Elsevier Interactive Patient Education 2016 [employer]  

## 2014-12-11 NOTE — Progress Notes (Signed)
Pre visit review using our clinic review tool, if applicable. No additional management support is needed unless otherwise documented below in the visit note. 

## 2015-01-01 ENCOUNTER — Ambulatory Visit (INDEPENDENT_AMBULATORY_CARE_PROVIDER_SITE_OTHER): Payer: PPO | Admitting: Internal Medicine

## 2015-01-01 ENCOUNTER — Encounter: Payer: Self-pay | Admitting: Internal Medicine

## 2015-01-01 VITALS — BP 128/72 | HR 83 | Temp 97.9°F | Wt 159.0 lb

## 2015-01-01 DIAGNOSIS — J0100 Acute maxillary sinusitis, unspecified: Secondary | ICD-10-CM | POA: Diagnosis not present

## 2015-01-01 MED ORDER — AMOXICILLIN 500 MG PO CAPS: 500.0000 mg | ORAL_CAPSULE | Freq: Three times a day (TID) | ORAL | Status: DC

## 2015-01-01 NOTE — Patient Instructions (Signed)

## 2015-01-01 NOTE — Progress Notes (Signed)
Pre visit review using our clinic review tool, if applicable. No additional management support is needed unless otherwise documented below in the visit note. 

## 2015-01-01 NOTE — Progress Notes (Signed)
HPI  Pt presents to the clinic today with c/o headache, facial pain and pressure, nasal congestion and cough. This started 1 week ago. She is blowing yellow mucous out of her nose. The cough is nonproductive. She is not sure if she has run fevers  But reports she has had chills and body aches. She has tried Mucinex and Zyrtec with minimal relief. She does have a history of seasonal allergies. She has not had sick contacts. She is concerned because she has an upcoming colonoscopy scheduled for next week.  Review of Systems    Past Medical History  Diagnosis Date  . Osteoporosis   . Hyperlipidemia   . GERD (gastroesophageal reflux disease)   . Hypertension     diuretic for edema at first  . Hypothyroidism   . Allergy   . Osteoarthritis, multiple sites     Family History  Problem Relation Age of Onset  . Heart disease Mother   . Stroke Father   . Hypertension Father   . Heart disease Father   . Diabetes Father     Social History   Social History  . Marital Status: Divorced    Spouse Name: N/A  . Number of Children: 3  . Years of Education: N/A   Occupational History  . Office supervisor     [employer]   Social History Main Topics  . Smoking status: Never Smoker   . Smokeless tobacco: Never Used  . Alcohol Use: No  . Drug Use: No  . Sexual Activity: Yes   Other Topics Concern  . Not on file   Social History Narrative   3 children--- 1 died   Lives alone      No living will   Requests son and daughter as health care POAs   Would accept resuscitation attempts but no prolonged artificial ventilation   Not sure about tube feeds    No Known Allergies   Constitutional: Positive headache, fatigue and fever. Denies abrupt weight changes.  HEENT:  Positive facial pain, nasal congestion and sore throat. Denies eye redness, ear pain, ringing in the ears, wax buildup, runny nose or bloody nose. Respiratory: Positive cough. Denies difficulty breathing or shortness of  breath.  Cardiovascular: Denies chest pain, chest tightness, palpitations or swelling in the hands or feet.   No other specific complaints in a complete review of systems (except as listed in HPI above).  Objective:   BP 128/72 mmHg  Pulse 83  Temp(Src) 97.9 F (36.6 C) (Oral)  Wt 159 lb (72.122 kg)  SpO2 97%  General: Appears her stated age, in NAD. HEENT: Head: normal shape and size, maxillary sinus tenderness noted; Eyes: sclera white, no icterus, conjunctiva pink; Ears: Tm's gray and intact, normal light reflex; Nose: mucosa boggy and moist, septum midline; Throat/Mouth: + PND. Teeth present, mucosa pink and moist, no exudate noted, no lesions or ulcerations noted.  Neck:  No lymphadenopathy noted.  Cardiovascular: Normal rate and rhythm. S1,S2 noted.  No murmur, rubs or gallops noted.  Pulmonary/Chest: Normal effort and positive vesicular breath sounds. No respiratory distress. No wheezes, rales or ronchi noted.      Assessment & Plan:   Acute bacterial sinusitis  Can use a Neti Pot which can be purchased from your local drug store. Flonase 2 sprays each nostril for 3 days and then as needed. Continue Zyrtec Amoxil TID for 10 days  RTC as needed or if symptoms persist.

## 2015-01-06 LAB — HM COLONOSCOPY

## 2015-01-13 ENCOUNTER — Encounter: Payer: Self-pay | Admitting: Internal Medicine

## 2015-02-04 DIAGNOSIS — K641 Second degree hemorrhoids: Secondary | ICD-10-CM | POA: Diagnosis not present

## 2015-02-18 DIAGNOSIS — K641 Second degree hemorrhoids: Secondary | ICD-10-CM | POA: Diagnosis not present

## 2015-02-18 DIAGNOSIS — K219 Gastro-esophageal reflux disease without esophagitis: Secondary | ICD-10-CM | POA: Diagnosis not present

## 2015-03-04 DIAGNOSIS — K641 Second degree hemorrhoids: Secondary | ICD-10-CM | POA: Diagnosis not present

## 2015-03-04 HISTORY — PX: UPPER GI ENDOSCOPY: SHX6162

## 2015-03-12 DIAGNOSIS — K2980 Duodenitis without bleeding: Secondary | ICD-10-CM | POA: Diagnosis not present

## 2015-03-12 DIAGNOSIS — R12 Heartburn: Secondary | ICD-10-CM | POA: Diagnosis not present

## 2015-03-12 DIAGNOSIS — K210 Gastro-esophageal reflux disease with esophagitis: Secondary | ICD-10-CM | POA: Diagnosis not present

## 2015-03-12 DIAGNOSIS — K208 Other esophagitis: Secondary | ICD-10-CM | POA: Diagnosis not present

## 2015-03-12 DIAGNOSIS — D131 Benign neoplasm of stomach: Secondary | ICD-10-CM | POA: Diagnosis not present

## 2015-03-12 DIAGNOSIS — K219 Gastro-esophageal reflux disease without esophagitis: Secondary | ICD-10-CM | POA: Diagnosis not present

## 2015-03-12 DIAGNOSIS — K449 Diaphragmatic hernia without obstruction or gangrene: Secondary | ICD-10-CM | POA: Diagnosis not present

## 2015-03-12 DIAGNOSIS — K317 Polyp of stomach and duodenum: Secondary | ICD-10-CM | POA: Diagnosis not present

## 2015-03-12 DIAGNOSIS — K228 Other specified diseases of esophagus: Secondary | ICD-10-CM | POA: Diagnosis not present

## 2015-03-26 ENCOUNTER — Other Ambulatory Visit: Payer: Self-pay | Admitting: Internal Medicine

## 2015-03-26 ENCOUNTER — Encounter: Payer: Self-pay | Admitting: Internal Medicine

## 2015-03-26 MED ORDER — FUROSEMIDE 20 MG PO TABS: 10.0000 mg | ORAL_TABLET | Freq: Every day | ORAL | Status: DC

## 2015-04-03 ENCOUNTER — Other Ambulatory Visit: Payer: Self-pay | Admitting: Internal Medicine

## 2015-04-03 ENCOUNTER — Encounter: Payer: Self-pay | Admitting: Internal Medicine

## 2015-04-03 MED ORDER — ONDANSETRON HCL 4 MG PO TABS: 4.0000 mg | ORAL_TABLET | Freq: Three times a day (TID) | ORAL | Status: DC | PRN

## 2015-04-14 ENCOUNTER — Ambulatory Visit (INDEPENDENT_AMBULATORY_CARE_PROVIDER_SITE_OTHER): Payer: PPO

## 2015-04-14 ENCOUNTER — Other Ambulatory Visit: Payer: PPO

## 2015-04-14 ENCOUNTER — Telehealth: Payer: Self-pay | Admitting: Family Medicine

## 2015-04-14 VITALS — BP 128/70 | HR 54 | Temp 97.8°F | Ht 65.5 in | Wt 155.5 lb

## 2015-04-14 DIAGNOSIS — Z0000 Encounter for general adult medical examination without abnormal findings: Secondary | ICD-10-CM

## 2015-04-14 NOTE — Telephone Encounter (Signed)
Will route to PCP. I don't see any [place] results in chart? Is this on correct patient?

## 2015-04-14 NOTE — Patient Instructions (Signed)
Amy Lin , Thank you for taking time to come for your Medicare Wellness Visit. I appreciate your ongoing commitment to your health goals. Please review the following plan we discussed and let me know if I can assist you in the future.   These are the goals we discussed: Goals    . Increase physical activity     When weather permits, I will walk outside 30 min daily. Otherwise, I will walk on my treadmill for 30 min.        This is a list of the screening recommended for you and due dates:  Health Maintenance  Topic Date Due  . Flu Shot  09/01/2015  . Tetanus Vaccine  02/01/2016  . DEXA scan (bone density measurement)  Completed  . Shingles Vaccine  Completed  . Pneumonia vaccines  Completed   Preventive Care for Adults  A healthy lifestyle and preventive care can promote health and wellness. Preventive health guidelines for adults include the following key practices.  . A routine yearly physical is a good way to check with your health care provider about your health and preventive screening. It is a chance to share any concerns and updates on your health and to receive a thorough exam.  . Visit your dentist for a routine exam and preventive care every 6 months. Brush your teeth twice a day and floss once a day. Good oral hygiene prevents tooth decay and gum disease.  . The frequency of eye exams is based on your age, health, family medical history, use  of contact lenses, and other factors. Follow your health care provider's ecommendations for frequency of eye exams.  . Eat a healthy diet. Foods like vegetables, fruits, whole grains, low-fat dairy products, and lean protein foods contain the nutrients you need without too many calories. Decrease your intake of foods high in solid fats, added sugars, and salt. Eat the right amount of calories for you. Get information about a proper diet from your health care provider, if necessary.  . Regular physical exercise is one of the most  important things you can do for your health. Most adults should get at least 150 minutes of moderate-intensity exercise (any activity that increases your heart rate and causes you to sweat) each week. In addition, most adults need muscle-strengthening exercises on 2 or more days a week.  Silver Sneakers may be a benefit available to you. To determine eligibility, you may visit the website: www.silversneakers.com or contact program at 419-675-1452 Mon-Fri between 8AM-8PM.   . Maintain a healthy weight. The body mass index (BMI) is a screening tool to identify possible weight problems. It provides an estimate of body fat based on height and weight. Your health care provider can find your BMI and can help you achieve or maintain a healthy weight.   For adults 20 years and older: ? A BMI below 18.5 is considered underweight. ? A BMI of 18.5 to 24.9 is normal. ? A BMI of 25 to 29.9 is considered overweight. ? A BMI of 30 and above is considered obese.   . Maintain normal blood lipids and cholesterol levels by exercising and minimizing your intake of saturated fat. Eat a balanced diet with plenty of fruit and vegetables. Blood tests for lipids and cholesterol should begin at age 58 and be repeated every 5 years. If your lipid or cholesterol levels are high, you are over 50, or you are at high risk for heart disease, you may need your cholesterol levels checked  more frequently. Ongoing high lipid and cholesterol levels should be treated with medicines if diet and exercise are not working.  . If you smoke, find out from your health care provider how to quit. If you do not use tobacco, please do not start.  . If you choose to drink alcohol, please do not consume more than 2 drinks per day. One drink is considered to be 12 ounces (355 mL) of beer, 5 ounces (148 mL) of wine, or 1.5 ounces (44 mL) of liquor.  . If you are 62-58 years old, ask your health care provider if you should take aspirin to prevent  strokes.  . Use sunscreen. Apply sunscreen liberally and repeatedly throughout the day. You should seek shade when your shadow is shorter than you. Protect yourself by wearing long sleeves, pants, a wide-brimmed hat, and sunglasses year round, whenever you are outdoors.  . Once a month, do a whole body skin exam, using a mirror to look at the skin on your back. Tell your health care provider of new moles, moles that have irregular borders, moles that are larger than a pencil eraser, or moles that have changed in shape or color.   **Please review info on metabolic syndrome and ways to increase HDL. Please contact Health Advisor or PCP if you have any questions.

## 2015-04-14 NOTE — Progress Notes (Signed)
Subjective:   Amy Lin is a 82 y.o. female who presents for an Initial Medicare Annual Wellness Visit.  Cardiac Risk Factors include: advanced age (>12men, >44 women);dyslipidemia;hypertension     Objective:    Today's Vitals   04/14/15 1155  BP: 128/70  Pulse: 54  Temp: 97.8 F (36.6 C)  TempSrc: Oral  Height: 5' 5.5" (1.664 m)  Weight: 155 lb 8 oz (70.534 kg)  SpO2: 95%  PainSc: 0-No pain    Current Medications (verified) Outpatient Encounter Prescriptions as of 04/14/2015  Medication Sig  . aspirin 81 MG EC tablet Take 81 mg by mouth daily.    . calcium-vitamin D (OSCAL WITH D) 500-200 MG-UNIT per tablet Take 1 tablet by mouth daily.    . cetirizine (ZYRTEC) 10 MG tablet Take 10 mg by mouth daily.    . Cholecalciferol (VITAMIN D3) 2000 UNITS TABS Take by mouth.    . Cyanocobalamin (VITAMIN B 12 PO) Take by mouth daily.  . furosemide (LASIX) 20 MG tablet Take 0.5 tablets (10 mg total) by mouth daily.  Marland Kitchen levothyroxine (SYNTHROID, LEVOTHROID) 100 MCG tablet Take 1 tablet (100 mcg total) by mouth daily before breakfast.  . Magnesium 250 MG TABS Take 1 tablet by mouth daily.  Marland Kitchen omeprazole (PRILOSEC) 40 MG capsule Take 40 mg by mouth daily.  . ondansetron (ZOFRAN) 4 MG tablet Take 1 tablet (4 mg total) by mouth every 8 (eight) hours as needed.  Marland Kitchen OVER THE COUNTER MEDICATION Take 1 capsule by mouth daily. Tumeric  . rosuvastatin (CRESTOR) 10 MG tablet Take 1 tablet (10 mg total) by mouth daily.  Marland Kitchen amoxicillin (AMOXIL) 500 MG capsule Take 1 capsule (500 mg total) by mouth 3 (three) times daily. (Patient not taking: Reported on 04/14/2015)  . esomeprazole (NEXIUM) 20 MG capsule Take 20 mg by mouth daily at 12 noon. Reported on 04/14/2015   No facility-administered encounter medications on file as of 04/14/2015.    Allergies (verified) Review of patient's allergies indicates no known allergies.   History: Past Medical History  Diagnosis Date  . Osteoporosis   .  Hyperlipidemia   . GERD (gastroesophageal reflux disease)   . Hypertension     diuretic for edema at first  . Hypothyroidism   . Allergy   . Osteoarthritis, multiple sites    Past Surgical History  Procedure Laterality Date  . Abdominal hysterectomy    . Carpal tunnel release  8/15  . Back surgery  1999    Discectomy and Ray cage  . Meniscectomy Left 2/15    Dr Veverly Fells  . Bladder repair  ~2010  . Upper gi endoscopy N/A Feb 2017  . Colonoscopy w/ biopsies N/A 2016   Family History  Problem Relation Age of Onset  . Heart disease Mother   . Stroke Father   . Hypertension Father   . Heart disease Father   . Diabetes Father    Social History   Occupational History  . Office supervisor     [employer]   Social History Main Topics  . Smoking status: Never Smoker   . Smokeless tobacco: Never Used  . Alcohol Use: No  . Drug Use: No  . Sexual Activity: No    Tobacco Counseling Counseling given: No   Activities of Daily Living In your present state of health, do you have any difficulty performing the following activities: 04/14/2015  Hearing? N  Vision? N  Difficulty concentrating or making decisions? N  Walking or climbing  stairs? N  Dressing or bathing? N  Doing errands, shopping? N  Preparing Food and eating ? N  Using the Toilet? N  In the past six months, have you accidently leaked urine? N  Do you have problems with loss of bowel control? N  Managing your Medications? N  Managing your Finances? N  Housekeeping or managing your Housekeeping? N    Immunizations and Health Maintenance Immunization History  Administered Date(s) Administered  . Influenza Split 11/01/2010, 10/31/2011  . Influenza Whole 01/31/2005, 11/01/2007, 12/04/2009  . Influenza, High Dose Seasonal PF 11/15/2013  . Influenza,inj,Quad PF,36+ Mos 10/16/2012, 11/06/2014  . Pneumococcal Conjugate-13 05/07/2014  . Pneumococcal Polysaccharide-23 01/31/2006  . Td 01/31/2006  . Zoster 08/08/2013    There are no preventive care reminders to display for this patient.  Patient Care Team: [name], NP as PCP - General (Internal Medicine)  Dr. Earlean Shawl - Consulting Physician     Assessment:   This is a routine wellness examination for [name].  Hearing/Vision screen  Hearing Screening   125Hz  250Hz  500Hz  1000Hz  2000Hz  4000Hz  8000Hz   Right ear:   40 40 40 40   Left ear:   40 40 40 40   Vision Screening Comments: Last eye exam was in Jan 2016; next eye appt scheduled April 2017  Dietary issues and exercise activities discussed: Current Exercise Habits: Home exercise routine, Type of exercise: Other - see comments (aerobics), Time (Minutes): 60, Frequency (Times/Week): 4, Weekly Exercise (Minutes/Week): 240, Intensity: Moderate  Goals    . Increase physical activity     When weather permits, I will walk outside 30 min daily. Otherwise, I will walk on my treadmill for 30 min.       Depression Screen PHQ 2/9 Scores 04/14/2015 11/06/2014 05/07/2014 09/03/2012  PHQ - 2 Score 0 0 0 0    Fall Risk Fall Risk  04/14/2015 11/06/2014 05/07/2014 09/03/2012  Falls in the past year? No No No No    Cognitive Function: MMSE - Mini Mental State Exam 04/14/2015  Orientation to time 5  Orientation to Place 5  Registration 3  Attention/ Calculation 5  Recall 3  Language- name 2 objects 0  Language- repeat 1  Language- follow 3 step command 3  Language- read & follow direction 1  Write a sentence 0  Copy design 0  Total score 26    Screening Tests Health Maintenance  Topic Date Due  . INFLUENZA VACCINE  09/01/2015  . TETANUS/TDAP  02/01/2016  . DEXA SCAN  Completed  . ZOSTAVAX  Completed  . PNA vac Low Risk Adult  Completed      Plan:      I have personally reviewed the Medicare Annual Wellness questionnaire and have noted the following in the patient's chart:  A. Medical and social history B. Use of alcohol, tobacco or illicit drugs  C. Current medications and  supplements D. Functional ability and status E.  Nutritional status F.  Physical activity G. Advance directives H. List of other physicians I.  Hospitalizations, surgeries, and ER visits in previous 12 months J.  [place] to include hearing, vision, cognitive, depression L. Referrals and appointments - none  In addition, I reviewed preventive protocols, quality metrics, and best practice recommendations specific to patient. A written personalized care plan for preventive services as well as general preventive health recommendations were provided to patient.  See attached scanned questionnaire for additional information.   Signed,   Lindell Noe, MHA, BS, LPN Health  Advisor 04/14/2015

## 2015-04-14 NOTE — Telephone Encounter (Signed)
Patient is requesting the results of her ultrasound done yesterday.

## 2015-04-14 NOTE — Progress Notes (Signed)
Pre visit review using our clinic review tool, if applicable. No additional management support is needed unless otherwise documented below in the visit note. 

## 2015-04-15 NOTE — Telephone Encounter (Signed)
Opened in error.  I spoke to patient who needed her [place] results and she received them yesterday.

## 2015-05-07 DIAGNOSIS — Z961 Presence of intraocular lens: Secondary | ICD-10-CM | POA: Diagnosis not present

## 2015-05-07 DIAGNOSIS — Z0100 Encounter for examination of eyes and vision without abnormal findings: Secondary | ICD-10-CM | POA: Diagnosis not present

## 2015-05-11 ENCOUNTER — Ambulatory Visit (INDEPENDENT_AMBULATORY_CARE_PROVIDER_SITE_OTHER): Payer: PPO | Admitting: Internal Medicine

## 2015-05-11 ENCOUNTER — Encounter: Payer: PPO | Admitting: Internal Medicine

## 2015-05-11 ENCOUNTER — Encounter: Payer: Self-pay | Admitting: Internal Medicine

## 2015-05-11 VITALS — BP 136/84 | HR 65 | Temp 98.2°F | Ht 65.5 in | Wt 154.5 lb

## 2015-05-11 DIAGNOSIS — I10 Essential (primary) hypertension: Secondary | ICD-10-CM

## 2015-05-11 DIAGNOSIS — J302 Other seasonal allergic rhinitis: Secondary | ICD-10-CM | POA: Diagnosis not present

## 2015-05-11 DIAGNOSIS — K219 Gastro-esophageal reflux disease without esophagitis: Secondary | ICD-10-CM

## 2015-05-11 DIAGNOSIS — Z0001 Encounter for general adult medical examination with abnormal findings: Secondary | ICD-10-CM

## 2015-05-11 DIAGNOSIS — M25551 Pain in right hip: Secondary | ICD-10-CM

## 2015-05-11 DIAGNOSIS — Z0000 Encounter for general adult medical examination without abnormal findings: Secondary | ICD-10-CM

## 2015-05-11 DIAGNOSIS — S00412A Abrasion of left ear, initial encounter: Secondary | ICD-10-CM

## 2015-05-11 DIAGNOSIS — E785 Hyperlipidemia, unspecified: Secondary | ICD-10-CM | POA: Diagnosis not present

## 2015-05-11 DIAGNOSIS — M810 Age-related osteoporosis without current pathological fracture: Secondary | ICD-10-CM

## 2015-05-11 DIAGNOSIS — E039 Hypothyroidism, unspecified: Secondary | ICD-10-CM | POA: Diagnosis not present

## 2015-05-11 DIAGNOSIS — R7303 Prediabetes: Secondary | ICD-10-CM

## 2015-05-11 LAB — T4, FREE: FREE T4: 1.13 ng/dL (ref 0.60–1.60)

## 2015-05-11 LAB — BASIC METABOLIC PANEL
BUN: 18 mg/dL (ref 6–23)
CHLORIDE: 106 meq/L (ref 96–112)
CO2: 27 meq/L (ref 19–32)
Calcium: 9.8 mg/dL (ref 8.4–10.5)
Creatinine, Ser: 0.92 mg/dL (ref 0.40–1.20)
GFR: 62.54 mL/min (ref >60.00)
Glucose, Bld: 92 mg/dL (ref 70–99)
POTASSIUM: 3.9 meq/L (ref 3.5–5.1)
Sodium: 142 mEq/L (ref 135–145)

## 2015-05-11 LAB — LIPID PANEL
Cholesterol: 178 mg/dL (ref 0–200)
HDL: 53.1 mg/dL (ref >39.00)
LDL Cholesterol: 108 mg/dL — ABNORMAL HIGH (ref 0–99)
NonHDL: 125.35
TRIGLYCERIDES: 88 mg/dL (ref 0.0–149.0)
Total CHOL/HDL Ratio: 3
VLDL: 17.6 mg/dL (ref 0.0–40.0)

## 2015-05-11 LAB — TSH: TSH: 0.19 u[IU]/mL — AB (ref 0.35–4.50)

## 2015-05-11 NOTE — Progress Notes (Signed)
Subjective:    Patient ID: Amy Lin, female    DOB: 05-Jan-1934, 82 y.o.   MRN: [id number]  HPI  Pt presents to the clinic today for her annual exam. She had her Medicare Wellness Exam 04/14/15. She is also due for follow up of chronic conditions.  Flu: 11/2014 Tetanus: 2008 Pneumovax: 01/31/2006 Prevnar: 05/2014 Zostovax: 07/2013 Pap Smear: 2011 Mammogram: 2011 Bone Density Exam: 03/2012 Colon Screening: 01/2015 Vision Screening: 05/2015 Dentist: biannually  Diet: She does consume meat. She eats fruits daily, veggies a few days per week. She tries to avoid fried foods. She drinks mostly water. Exercise: She goes to [place] 3/wk. She wants to start walking 30 min/day but has not gotten around to it yet.  HTN: BP well controlled off meds. She takes Lasix for edema in her lower legs. She does not elevate her legs or wear compression hose. Her BP today is 136/84. No ECG in system for review.   GERD: She is not sure what her triggers are. She denies breakthrough symptoms on Prilosec. She sees Dr. Earlean Shawl in 2 days.  HLD: Her last LDL was 43.2. She denies myalgias on Crestor. She tries to consume a low fat diet.  Hypothyroidism: Labs from 6 months ago reviewed. She denies any issues on her current dose of Synthroid.  Osteoporosis: Her last bone density was in 2014. She is taking a Calcium and Vit D supplement  Prediabetes: Most recent A1C was 6.4%  Seasonal allergies: She takes Zyrtec daily.  She is concerned about bleeding from her left ear. She noticed this 2 days ago. She has had some pressure but denies pain. She denies hearing loss to her left ear. She denies any trauma or use of Qtip.  She also c/o right hip pain. This has been going on for months. It is worse first thing in the morning. She describes the pain as sore, achy and stiff. She thinks it is related to arthritis. She denies difficulty with gait. She takes Ibuprofen occasionally for pain with good relief.  Review of  Systems    Past Medical History  Diagnosis Date  . Osteoporosis   . Hyperlipidemia   . GERD (gastroesophageal reflux disease)   . Hypertension     diuretic for edema at first  . Hypothyroidism   . Allergy   . Osteoarthritis, multiple sites     Current Outpatient Prescriptions  Medication Sig Dispense Refill  . aspirin 81 MG EC tablet Take 81 mg by mouth daily.      . calcium-vitamin D (OSCAL WITH D) 500-200 MG-UNIT per tablet Take 1 tablet by mouth daily.      . cetirizine (ZYRTEC) 10 MG tablet Take 10 mg by mouth daily.      . Cholecalciferol (VITAMIN D3) 2000 UNITS TABS Take by mouth.      . Cyanocobalamin (VITAMIN B 12 PO) Take by mouth daily.    . furosemide (LASIX) 20 MG tablet Take 0.5 tablets (10 mg total) by mouth daily. 30 tablet 2  . levothyroxine (SYNTHROID, LEVOTHROID) 100 MCG tablet Take 1 tablet (100 mcg total) by mouth daily before breakfast. 90 tablet 3  . omeprazole (PRILOSEC) 40 MG capsule Take 40 mg by mouth daily.    . rosuvastatin (CRESTOR) 10 MG tablet Take 1 tablet (10 mg total) by mouth daily. 30 tablet 2  . OVER THE COUNTER MEDICATION Take 1 capsule by mouth daily. Reported on 05/11/2015     No current facility-administered medications for this visit.  No Known Allergies  Family History  Problem Relation Age of Onset  . Heart disease Mother   . Stroke Father   . Hypertension Father   . Heart disease Father   . Diabetes Father     Social History   Social History  . Marital Status: Divorced    Spouse Name: N/A  . Number of Children: 3  . Years of Education: N/A   Occupational History  . Office supervisor     [employer]   Social History Main Topics  . Smoking status: Never Smoker   . Smokeless tobacco: Never Used  . Alcohol Use: No  . Drug Use: No  . Sexual Activity: No   Other Topics Concern  . Not on file   Social History Narrative   3 children--- 1 died   Lives alone      No living will   Requests son and daughter as  health care POAs   Would accept resuscitation attempts but no prolonged artificial ventilation   Not sure about tube feeds     Constitutional: Denies fever, malaise, fatigue, headache or abrupt weight changes.  HEENT: Denies eye pain, eye redness, ear pain, ringing in the ears, wax buildup, runny nose, nasal congestion, bloody nose, or sore throat. Respiratory: Denies difficulty breathing, shortness of breath, cough or sputum production.   Cardiovascular: Denies chest pain, chest tightness, palpitations or swelling in the hands or feet.  Gastrointestinal: Denies abdominal pain, bloating, constipation, diarrhea or blood in the stool.  GU: Denies urgency, frequency, pain with urination, burning sensation, blood in urine, odor or discharge. Musculoskeletal: Pt reports right hip pain. Denies decrease in range of motion, difficulty with gait, muscle pain or joint swelling.  Skin: Denies redness, rashes, lesions or ulcercations.  Neurological: Denies dizziness, difficulty with memory, difficulty with speech or problems with balance and coordination.  Psych: Denies anxiety, depression, SI/HI.  No other specific complaints in a complete review of systems (except as listed in HPI above).  Objective:   Physical Exam   BP 136/84 mmHg  Pulse 65  Temp(Src) 98.2 F (36.8 C) (Oral)  Ht 5' 5.5" (1.664 m)  Wt 154 lb 8 oz (70.081 kg)  BMI 25.31 kg/m2  SpO2 96% Wt Readings from Last 3 Encounters:  05/11/15 154 lb 8 oz (70.081 kg)  04/14/15 155 lb 8 oz (70.534 kg)  01/01/15 159 lb (72.122 kg)    General: Appears her stated age, well developed, well nourished in NAD. Skin: Warm, dry and intact. Abrasion noted to external left ear canal. HEENT: Head: normal shape and size; Eyes: sclera white, no icterus, conjunctiva pink, PERRLA and EOMs intact; Ears: Tm's gray and intact, normal light reflex;Throat/Mouth: Teeth present, mucosa pink and moist, no exudate, lesions or ulcerations noted.  Neck:  Neck  supple, trachea midline. No masses, lumps or thyromegaly present.  Cardiovascular: Normal rate and rhythm. S1,S2 noted.  No murmur, rubs or gallops noted. Trace BLE edema, R>L. No carotid bruits noted. Pulmonary/Chest: Normal effort and positive vesicular breath sounds. No respiratory distress. No wheezes, rales or ronchi noted.  Abdomen: Soft and nontender. Normal bowel sounds. No distention or masses noted. Liver, spleen and kidneys non palpable. Musculoskeletal: Normal flexion and extension of the right hip. Normal internal and external rotation of the right hip. No pain with palpation. No signs of joint swelling. Strength 5/5 BUE/BLE. No difficulty with gait.  Neurological: Alert and oriented. Cranial nerves II-XII grossly intact. Coordination normal.  Psychiatric: Mood and affect normal.  Behavior is normal. Judgment and thought content normal.     BMET    Component Value Date/Time   NA 140 12/11/2014 1112   K 3.8 12/11/2014 1112   CL 106 12/11/2014 1112   CO2 26 12/11/2014 1112   GLUCOSE 108* 12/11/2014 1112   BUN 15 12/11/2014 1112   CREATININE 0.91 12/11/2014 1112   CALCIUM 9.1 12/11/2014 1112   GFRNONAA 68.59 12/04/2009 1131   GFRAA 91 03/12/2008 0928    Lipid Panel     Component Value Date/Time   CHOL 251* 11/06/2014 0932   TRIG 216.0* 11/06/2014 0932   TRIG 103 12/06/2005 1055   HDL 39.40 11/06/2014 0932   CHOLHDL 6 11/06/2014 0932   CHOLHDL 3.9 CALC 12/06/2005 1055   VLDL 43.2* 11/06/2014 0932   LDLCALC 153* 07/30/2013 0848    CBC    Component Value Date/Time   WBC 5.1 11/06/2014 0932   RBC 4.20 11/06/2014 0932   HGB 12.8 11/06/2014 0932   HCT 38.1 11/06/2014 0932   PLT 251.0 11/06/2014 0932   MCV 90.6 11/06/2014 0932   MCHC 33.5 11/06/2014 0932   RDW 13.3 11/06/2014 0932   LYMPHSABS 2.0 05/07/2014 1244   MONOABS 0.8 05/07/2014 1244   EOSABS 0.3 05/07/2014 1244   BASOSABS 0.0 05/07/2014 1244    Hgb A1C Lab Results  Component Value Date   HGBA1C 6.4  11/06/2014        Assessment & Plan:   Preventative Health Maintenance:  All immunizations UTD She no longer wants to do pap smear, mammogram or colonoscopy Will repeat bone density next year Encouraged her to continue to see an eye doctor and dentist at least annually Encouraged her to continue to consume a balanced diet and exercise regimen  Abrasion, left ear canal:  No intervention needed Avoid sticking Q Tips in the ear  Arthritis, right hip:  Start Aleve daily Increase physical activity  RTC in 6 months for annual exam

## 2015-05-11 NOTE — Progress Notes (Signed)
Pre visit review using our clinic review tool, if applicable. No additional management support is needed unless otherwise documented below in the visit note. 

## 2015-05-11 NOTE — Patient Instructions (Signed)

## 2015-05-11 NOTE — Assessment & Plan Note (Signed)
Consider weaning Prilosec to 20 mg daily She will discuss this with Dr. Earlean Shawl later this week

## 2015-05-11 NOTE — Assessment & Plan Note (Signed)
Controlled off meds Continue Lasix for edema BMET today

## 2015-05-11 NOTE — Assessment & Plan Note (Signed)
Will continue to monitor A1C yearly

## 2015-05-11 NOTE — Assessment & Plan Note (Signed)
Will check Lipid Profile today Encouraged her to consume a low fat diet Continue Crestor unless directed otherwise

## 2015-05-11 NOTE — Assessment & Plan Note (Signed)
Controlled on Zyrtec 

## 2015-05-11 NOTE — Assessment & Plan Note (Signed)
TSH and T4 today Will refill/adjust Synthroid based on labs 

## 2015-05-11 NOTE — Assessment & Plan Note (Signed)
Continue Calcium and Vit D supplement Repeat bone density in 1 year

## 2015-05-12 DIAGNOSIS — K219 Gastro-esophageal reflux disease without esophagitis: Secondary | ICD-10-CM | POA: Diagnosis not present

## 2015-05-12 DIAGNOSIS — R197 Diarrhea, unspecified: Secondary | ICD-10-CM | POA: Diagnosis not present

## 2015-05-12 MED ORDER — LEVOTHYROXINE SODIUM 88 MCG PO TABS: 88.0000 ug | ORAL_TABLET | Freq: Every day | ORAL | Status: DC

## 2015-05-12 NOTE — Addendum Note (Signed)
Addended by: [name] on: 05/12/2015 08:10 AM   Modules accepted: Orders, Medications, SmartSet

## 2015-05-18 DIAGNOSIS — R197 Diarrhea, unspecified: Secondary | ICD-10-CM | POA: Diagnosis not present

## 2015-05-26 NOTE — Progress Notes (Signed)
Medical screening examination/treatment/procedure(s) were performed by non-physician practitioner and as supervising physician I was immediately available for consultation/collaboration. I have reviewed all documentation.

## 2015-06-15 DIAGNOSIS — R197 Diarrhea, unspecified: Secondary | ICD-10-CM | POA: Diagnosis not present

## 2015-07-01 ENCOUNTER — Other Ambulatory Visit (INDEPENDENT_AMBULATORY_CARE_PROVIDER_SITE_OTHER): Payer: PPO

## 2015-07-01 DIAGNOSIS — E039 Hypothyroidism, unspecified: Secondary | ICD-10-CM

## 2015-07-01 LAB — T4, FREE: FREE T4: 1.07 ng/dL (ref 0.60–1.60)

## 2015-07-01 LAB — TSH: TSH: 0.19 u[IU]/mL — AB (ref 0.35–4.50)

## 2015-07-06 ENCOUNTER — Telehealth: Payer: Self-pay | Admitting: Internal Medicine

## 2015-07-06 NOTE — Telephone Encounter (Signed)
Patient returned Melanie's call.  Patient is about to leave and won't be back home until 10:00 pm.  Please call patient back first thing in the morning.  She's concerned about her lab work.

## 2015-07-06 NOTE — Telephone Encounter (Signed)
Patient returned Melanie's call. °

## 2015-07-07 MED ORDER — LEVOTHYROXINE SODIUM 75 MCG PO TABS: 75.0000 ug | ORAL_TABLET | Freq: Every day | ORAL | Status: DC

## 2015-07-07 NOTE — Addendum Note (Signed)
Addended by: Lurlean Nanny on: 07/07/2015 10:06 AM   Modules accepted: Orders, Medications

## 2015-07-07 NOTE — Telephone Encounter (Signed)
See result note.  

## 2015-07-07 NOTE — Telephone Encounter (Signed)
Patient is asking for [name] to return her call.

## 2015-08-18 ENCOUNTER — Other Ambulatory Visit (INDEPENDENT_AMBULATORY_CARE_PROVIDER_SITE_OTHER): Payer: PPO

## 2015-08-18 DIAGNOSIS — E039 Hypothyroidism, unspecified: Secondary | ICD-10-CM | POA: Diagnosis not present

## 2015-08-18 LAB — T4, FREE: FREE T4: 0.98 ng/dL (ref 0.60–1.60)

## 2015-08-18 LAB — TSH: TSH: 1.14 u[IU]/mL (ref 0.35–4.50)

## 2015-08-31 ENCOUNTER — Other Ambulatory Visit: Payer: Self-pay | Admitting: Internal Medicine

## 2015-08-31 DIAGNOSIS — E039 Hypothyroidism, unspecified: Secondary | ICD-10-CM

## 2015-10-01 ENCOUNTER — Other Ambulatory Visit: Payer: Self-pay | Admitting: Internal Medicine

## 2015-11-10 ENCOUNTER — Ambulatory Visit: Payer: PPO | Admitting: Internal Medicine

## 2015-11-11 ENCOUNTER — Other Ambulatory Visit: Payer: Self-pay | Admitting: Internal Medicine

## 2015-11-11 ENCOUNTER — Ambulatory Visit (INDEPENDENT_AMBULATORY_CARE_PROVIDER_SITE_OTHER): Payer: PPO | Admitting: Internal Medicine

## 2015-11-11 ENCOUNTER — Ambulatory Visit: Payer: PPO | Admitting: Internal Medicine

## 2015-11-11 ENCOUNTER — Encounter: Payer: Self-pay | Admitting: Internal Medicine

## 2015-11-11 VITALS — BP 134/80 | HR 60 | Temp 98.1°F | Wt 152.5 lb

## 2015-11-11 DIAGNOSIS — E7800 Pure hypercholesterolemia, unspecified: Secondary | ICD-10-CM | POA: Diagnosis not present

## 2015-11-11 DIAGNOSIS — Z23 Encounter for immunization: Secondary | ICD-10-CM

## 2015-11-11 DIAGNOSIS — J302 Other seasonal allergic rhinitis: Secondary | ICD-10-CM | POA: Diagnosis not present

## 2015-11-11 DIAGNOSIS — K2100 Gastro-esophageal reflux disease with esophagitis, without bleeding: Secondary | ICD-10-CM

## 2015-11-11 DIAGNOSIS — E039 Hypothyroidism, unspecified: Secondary | ICD-10-CM | POA: Diagnosis not present

## 2015-11-11 DIAGNOSIS — M810 Age-related osteoporosis without current pathological fracture: Secondary | ICD-10-CM | POA: Diagnosis not present

## 2015-11-11 DIAGNOSIS — I10 Essential (primary) hypertension: Secondary | ICD-10-CM

## 2015-11-11 DIAGNOSIS — R7303 Prediabetes: Secondary | ICD-10-CM

## 2015-11-11 DIAGNOSIS — M79672 Pain in left foot: Principal | ICD-10-CM

## 2015-11-11 DIAGNOSIS — M79671 Pain in right foot: Secondary | ICD-10-CM

## 2015-11-11 LAB — COMPREHENSIVE METABOLIC PANEL
ALK PHOS: 58 U/L (ref 39–117)
ALT: 10 U/L (ref 0–35)
AST: 18 U/L (ref 0–37)
Albumin: 3.9 g/dL (ref 3.5–5.2)
BUN: 14 mg/dL (ref 6–23)
CALCIUM: 9.6 mg/dL (ref 8.4–10.5)
CO2: 27 mEq/L (ref 19–32)
Chloride: 104 mEq/L (ref 96–112)
Creatinine, Ser: 0.95 mg/dL (ref 0.40–1.20)
GFR: 60.19 mL/min (ref >60.00)
GLUCOSE: 95 mg/dL (ref 70–99)
POTASSIUM: 3.6 meq/L (ref 3.5–5.1)
Sodium: 141 mEq/L (ref 135–145)
TOTAL PROTEIN: 7.3 g/dL (ref 6.0–8.3)
Total Bilirubin: 0.5 mg/dL (ref 0.2–1.2)

## 2015-11-11 LAB — TSH: TSH: 1.7 u[IU]/mL (ref 0.35–4.50)

## 2015-11-11 LAB — LIPID PANEL
CHOLESTEROL: 243 mg/dL — AB (ref 0–200)
HDL: 55.1 mg/dL (ref >39.00)
LDL Cholesterol: 167 mg/dL — ABNORMAL HIGH (ref 0–99)
NONHDL: 187.97
Total CHOL/HDL Ratio: 4
Triglycerides: 107 mg/dL (ref 0.0–149.0)
VLDL: 21.4 mg/dL (ref 0.0–40.0)

## 2015-11-11 LAB — T4, FREE: FREE T4: 0.89 ng/dL (ref 0.60–1.60)

## 2015-11-11 LAB — HEMOGLOBIN A1C: HEMOGLOBIN A1C: 6.2 % (ref 4.6–6.5)

## 2015-11-11 NOTE — Assessment & Plan Note (Signed)
Will check TSH and T4 today Will adjust Synthroid if needed based on labs 

## 2015-11-11 NOTE — Assessment & Plan Note (Signed)
Bone density ordered Continue Calcium and Vit D for now Consider Prolia injections depending on results of bone density exam

## 2015-11-11 NOTE — Assessment & Plan Note (Signed)
Deteriorated 80 mg Depo IM today Advised her to use Saline nasal drops OTC Continue Zyrtec  Return precautions discussed

## 2015-11-11 NOTE — Progress Notes (Signed)
Subjective:    Patient ID: Amy Lin, female    DOB: 11-27-1933, 82 y.o.   MRN: [id number]  HPI  Pt presents to the clinic today for follow up of chronic conditions.  HTN: BP well controlled off meds. She takes Lasix for edema in her lower legs. She does not elevate her legs or wear compression hose. Her BP today is 134/80. Goal is < 150/90. No ECG in system for review.   GERD: She is not sure what her triggers are. She reports Dr. Earlean Shawl switched her to Ryan from [place]. She reports it is working well for her. She had a UGI 03/2015 with showed erosive esophagitis, gastric polyps and a hiatal hernia.  HLD: Her last LDL was 108, goal < 130. She denies myalgias on Crestor. She tries to consume a low fat diet.  Hypothyroidism: Labs from 08/2015 reviewed. Her dose was reduced to 75 mcg 06/2015. She denies any issues on her current dose of Synthroid.  Osteoporosis: Her last bone density was in 2014. She is taking a Calcium and Vit D supplement. She reports she was treated for osteoporosis with Reclast by Dr. Arnoldo Morale in the past but has not had any treatment in the last few years.  Prediabetes: Most recent A1C was 6.4%, 11/2014. She has never been diagnosed with diabetes and she does not take any medications for this. She denies blurred vision, increased thirst, or urinary frequency. She does have an abnormal sensation in her feet. She describes it as "fullness". She denies numbness, tingling or pain in her feet.  Seasonal allergies: She reports this has been bad over the last week. She has runny nose, facial pressure, ear fullness and post nasal drip. She is blowing yellow mucous out of her nose. She denies ringing in her ears or decreased hearing. She denies difficulty swallowing, cough or chest congestion. She denies fever, chills or body aches. She takes Zyrtec daily but has not noticed that it helps. She has not had sick contacts that she is aware of.    Review of Systems    Past  Medical History:  Diagnosis Date  . Allergy   . GERD (gastroesophageal reflux disease)   . Hyperlipidemia   . Hypertension    diuretic for edema at first  . Hypothyroidism   . Osteoarthritis, multiple sites   . Osteoporosis     Current Outpatient Prescriptions  Medication Sig Dispense Refill  . aspirin 81 MG EC tablet Take 81 mg by mouth daily.      . calcium-vitamin D (OSCAL WITH D) 500-200 MG-UNIT per tablet Take 1 tablet by mouth daily.      . cetirizine (ZYRTEC) 10 MG tablet Take 10 mg by mouth daily.      . Cholecalciferol (VITAMIN D3) 2000 UNITS TABS Take by mouth.      . Cyanocobalamin (VITAMIN B 12 PO) Take by mouth daily.    . furosemide (LASIX) 20 MG tablet TAKE 1/2 TABLET BY MOUTH DAILY 30 tablet 1  . levothyroxine (SYNTHROID, LEVOTHROID) 75 MCG tablet TAKE 1 TABLET BY MOUTH DAILY 30 tablet 3  . omeprazole (PRILOSEC) 40 MG capsule Take 40 mg by mouth daily.    Marland Kitchen OVER THE COUNTER MEDICATION Take 1 capsule by mouth daily. Reported on 05/11/2015    . rosuvastatin (CRESTOR) 10 MG tablet Take 1 tablet (10 mg total) by mouth daily. 30 tablet 2   No current facility-administered medications for this visit.     No Known Allergies  Family History  Problem Relation Age of Onset  . Heart disease Mother   . Stroke Father   . Hypertension Father   . Heart disease Father   . Diabetes Father     Social History   Social History  . Marital status: Divorced    Spouse name: N/A  . Number of children: 3  . Years of education: N/A   Occupational History  . Office supervisor     [employer]   Social History Main Topics  . Smoking status: Never Smoker  . Smokeless tobacco: Never Used  . Alcohol use No  . Drug use: No  . Sexual activity: No   Other Topics Concern  . Not on file   Social History Narrative   3 children--- 1 died   Lives alone      No living will   Requests son and daughter as health care POAs   Would accept resuscitation attempts but no prolonged  artificial ventilation   Not sure about tube feeds     Constitutional: Pt reports headache. Denies fever, malaise, fatigue, or abrupt weight changes.  HEENT: Pt reports runny nose and PND. Denies eye pain, eye redness, ear pain, ringing in the ears, wax buildup, nasal congestion, bloody nose, or sore throat. Respiratory: Denies difficulty breathing, shortness of breath, cough or sputum production.   Cardiovascular: Denies chest pain, chest tightness, palpitations or swelling in the hands or feet.  Gastrointestinal: Denies abdominal pain, bloating, constipation, diarrhea or blood in the stool.  GU: Denies urgency, frequency, pain with urination, burning sensation, blood in urine, odor or discharge. Musculoskeletal: Pt reports pain in feet. Denies decrease in range of motion, difficulty with gait, muscle pain or joint swelling.  Skin: Denies redness, rashes, lesions or ulcercations.  Neurological: Denies dizziness, difficulty with memory, difficulty with speech or problems with balance and coordination.  Psych: Denies anxiety, depression, SI/HI.  No other specific complaints in a complete review of systems (except as listed in HPI above).  Objective:   Physical Exam  BP 134/80   Pulse 60   Temp 98.1 F (36.7 C) (Oral)   Wt 152 lb 8 oz (69.2 kg)   SpO2 97%   BMI 24.99 kg/m   Wt Readings from Last 3 Encounters:  05/11/15 154 lb 8 oz (70.1 kg)  04/14/15 155 lb 8 oz (70.5 kg)  01/01/15 159 lb (72.1 kg)    General: Appears her stated age, in NAD. HEENT: Head: normal shape and size, no sinus tenderness noted; Eyes: sclera white, no icterus, conjunctiva pink; Ears: Tm's gray and intact, normal light reflex, + serous effusion bilaterally; Nose: mucosa boggy but moist; Throat/Mouth: Teeth present, mucosa pink and moist, + PND, no exudate, lesions or ulcerations noted.  Neck:  No adenopathy noted. Neck supple, trachea midline. No masses, lumps or thyromegaly present.  Cardiovascular:  Normal rate and rhythm. S1,S2 noted.  No murmur, rubs or gallops noted. No BLE edma. No carotid bruits noted. Pulmonary/Chest: Normal effort and positive vesicular breath sounds. No respiratory distress. No wheezes, rales or ronchi noted.  Abdomen: Soft and nontender. Normal bowel sounds.  Musculoskeletal: Normal dorsiflexion and plantar flexion of bilateral feet. She has small bunions bilaterally but no pain with palpation. No pain with palpation of the ball of the foot or arches. No difficulty with gait.  Neurological: Alert and oriented.    BMET    Component Value Date/Time   NA 142 05/11/2015 0858   K 3.9 05/11/2015 0858  CL 106 05/11/2015 0858   CO2 27 05/11/2015 0858   GLUCOSE 92 05/11/2015 0858   BUN 18 05/11/2015 0858   CREATININE 0.92 05/11/2015 0858   CALCIUM 9.8 05/11/2015 0858   GFRNONAA 68.59 12/04/2009 1131   GFRAA 91 03/12/2008 0928    Lipid Panel     Component Value Date/Time   CHOL 178 05/11/2015 0858   TRIG 88.0 05/11/2015 0858   TRIG 103 12/06/2005 1055   HDL 53.10 05/11/2015 0858   CHOLHDL 3 05/11/2015 0858   VLDL 17.6 05/11/2015 0858   LDLCALC 108 (H) 05/11/2015 0858    CBC    Component Value Date/Time   WBC 5.1 11/06/2014 0932   RBC 4.20 11/06/2014 0932   HGB 12.8 11/06/2014 0932   HCT 38.1 11/06/2014 0932   PLT 251.0 11/06/2014 0932   MCV 90.6 11/06/2014 0932   MCHC 33.5 11/06/2014 0932   RDW 13.3 11/06/2014 0932   LYMPHSABS 2.0 05/07/2014 1244   MONOABS 0.8 05/07/2014 1244   EOSABS 0.3 05/07/2014 1244   BASOSABS 0.0 05/07/2014 1244    Hgb A1C Lab Results  Component Value Date   HGBA1C 6.4 11/06/2014        Assessment & Plan:    RTC in 6 months for annual exam

## 2015-11-11 NOTE — Assessment & Plan Note (Signed)
Will check CMET and lipid profile today Encouraged her to consume a low fat diet Continue Crestor for now

## 2015-11-11 NOTE — Assessment & Plan Note (Signed)
Controlled off meds  Will monitor 

## 2015-11-11 NOTE — Patient Instructions (Signed)

## 2015-11-11 NOTE — Assessment & Plan Note (Signed)
Will check A1C today Discussed the importance of low carb diet She is having some issues with feet, possible neuropathy If A1C at goal, and continues to have foot pain, will refer to podiatry/ortho

## 2015-11-11 NOTE — Assessment & Plan Note (Signed)
Stable now on Dexilent She will continue to follow with Dr. Earlean Shawl

## 2015-11-12 MED ORDER — METHYLPREDNISOLONE ACETATE 80 MG/ML IJ SUSP
80.0000 mg | Freq: Once | INTRAMUSCULAR | Status: AC
  Administered 2015-11-11: 80 mg via INTRAMUSCULAR

## 2015-11-12 NOTE — Addendum Note (Signed)
Addended by: Lurlean Nanny on: 11/12/2015 09:13 AM   Modules accepted: Orders

## 2015-11-16 ENCOUNTER — Other Ambulatory Visit: Payer: Self-pay | Admitting: Internal Medicine

## 2015-11-17 ENCOUNTER — Other Ambulatory Visit: Payer: Self-pay | Admitting: Internal Medicine

## 2015-11-17 DIAGNOSIS — Z1231 Encounter for screening mammogram for malignant neoplasm of breast: Secondary | ICD-10-CM

## 2015-11-20 ENCOUNTER — Ambulatory Visit (INDEPENDENT_AMBULATORY_CARE_PROVIDER_SITE_OTHER): Payer: PPO | Admitting: Podiatry

## 2015-11-20 ENCOUNTER — Encounter: Payer: Self-pay | Admitting: Podiatry

## 2015-11-20 VITALS — BP 154/83 | HR 58 | Resp 16

## 2015-11-20 DIAGNOSIS — M19072 Primary osteoarthritis, left ankle and foot: Secondary | ICD-10-CM | POA: Diagnosis not present

## 2015-11-20 DIAGNOSIS — M19071 Primary osteoarthritis, right ankle and foot: Secondary | ICD-10-CM | POA: Diagnosis not present

## 2015-11-20 MED ORDER — ROSUVASTATIN CALCIUM 20 MG PO TABS: 20.0000 mg | ORAL_TABLET | Freq: Every day | ORAL | 2 refills | Status: DC

## 2015-11-20 NOTE — Addendum Note (Signed)
Addended by: Lurlean Nanny on: 11/20/2015 05:03 PM   Modules accepted: Orders

## 2015-11-20 NOTE — Progress Notes (Signed)
Subjective:  Patient presents to the office today with concerning sensations regarding her left foot. Patient denies trauma. Patient states that when she steps down out of bed she feels a "fulness" in her left foot that is not painful.  Patient presents today for further treatment and evaluation    Objective/Physical Exam General: The patient is alert and oriented x3 in no acute distress.  Dermatology: Skin is warm, dry and supple bilateral lower extremities. Negative for open lesions or macerations.  Vascular: Palpable pedal pulses bilaterally. No edema or erythema noted. Capillary refill within normal limits.  Neurological: Epicritic and protective threshold grossly intact bilaterally.   Musculoskeletal Exam: Range of motion within normal limits to all pedal and ankle joints bilateral. Muscle strength 5/5 in all groups bilateral.   Radiographic Exam:  Normal osseous mineralization. Joint spaces preserved. No fracture/dislocation/boney destruction.    Assessment: #1 diffuse, idiopathic "fulness" left foot.  #2 midfoot arthritis bilateral feet   Plan of Care:  #1 Patient was evaluated. #2 complete comprehensive evaluation was performed of the bilateral lower extremities. No significant findings. #3 patient is to return to clinic when necessary   Dr. Edrick Kins, [place]

## 2015-12-04 ENCOUNTER — Other Ambulatory Visit: Payer: Self-pay | Admitting: Internal Medicine

## 2015-12-04 DIAGNOSIS — E039 Hypothyroidism, unspecified: Secondary | ICD-10-CM

## 2015-12-17 ENCOUNTER — Telehealth: Payer: Self-pay

## 2015-12-17 ENCOUNTER — Telehealth: Payer: Self-pay | Admitting: Internal Medicine

## 2015-12-17 NOTE — Telephone Encounter (Signed)
Spoke with patient through our receptionist, Morey Hummingbird, and encouraged patient if symptoms continuing to go to Er, she continues to refuse stating she feels better and wants to wait and see NP tomorrow.  Appointment made with Webb Silversmith for eval and if symptoms return or worsen she understands to go to the Er.

## 2015-12-17 NOTE — Telephone Encounter (Signed)
Patient Name: Amy Lin DOB: 03-08-1933 Initial Comment Caller states, she does not feel well. She assumes due to blood pressure she has blurry vision and trouble standing. She feels weak. BP was at 104/40. She still doesn't feel to well. Low energy. Verified Nurse Assessment Nurse: Andria Frames, RN, Aeriel Date/Time [name] Time): 12/17/2015 1:32:22 PM Confirm and document reason for call. If symptomatic, describe symptoms. You must click the next button to save text entered. ---Caller states, she does not feel well. She assumes due to blood pressure she has blurry vision and trouble standing. She feels weak. BP was at 104/40. She still doesn't feel to well. This was last night. Today she is seeing okay. She couldn't see things. She sat down for a while until she was able to get herself dried off. Then she sat down on the toilet and she layed on the bed for a while until she fell better. Then she got dressed went to the kitchen and got something to drink and eat. She isn't sure if she took her bp before or after she ate something. Then she took it a few more times it came up to 107/unsure. Then she ate some country home and ate that. Then she started feeling better. Caller states, she feels better today but not great. This morning her bp was 160/69. That was 8am this morning. Caller states, she feels weak and not great. Caller states, she is not having issues with her vision. She just feels weak. Has the patient traveled out of the country within the last 30 days? ---No Does the patient have any new or worsening symptoms? ---Yes Will a triage be completed? ---Yes Related visit to physician within the last 2 weeks? ---No Does the PT have any chronic conditions? (i.e. diabetes, asthma, etc.) ---No Is this a behavioral health or substance abuse call? ---No Guidelines Guideline Title Affirmed Question Affirmed Notes Chest Pain [1] Chest pain lasts > 5 minutes AND [2] described as  crushing, pressure-like, or heavy Final Disposition User Call EMS 911 Now Hensel, RN, Aeriel Comments Current bp is 165/70 Nurse informed backline of pt refusal to 911. Disagree/Comply: Disagree

## 2015-12-17 NOTE — Telephone Encounter (Signed)
Opened in error

## 2015-12-18 ENCOUNTER — Ambulatory Visit: Payer: PPO | Admitting: Internal Medicine

## 2015-12-18 ENCOUNTER — Encounter: Payer: Self-pay | Admitting: Family Medicine

## 2015-12-18 ENCOUNTER — Ambulatory Visit (INDEPENDENT_AMBULATORY_CARE_PROVIDER_SITE_OTHER): Payer: PPO | Admitting: Family Medicine

## 2015-12-18 VITALS — BP 122/68 | HR 76 | Temp 97.4°F | Wt 154.5 lb

## 2015-12-18 DIAGNOSIS — R0789 Other chest pain: Secondary | ICD-10-CM | POA: Diagnosis not present

## 2015-12-18 DIAGNOSIS — I959 Hypotension, unspecified: Secondary | ICD-10-CM

## 2015-12-18 DIAGNOSIS — R531 Weakness: Secondary | ICD-10-CM | POA: Diagnosis not present

## 2015-12-18 LAB — CBC WITH DIFFERENTIAL/PLATELET
BASOS ABS: 0 10*3/uL (ref 0.0–0.1)
Basophils Relative: 0.8 % (ref 0.0–3.0)
Eosinophils Absolute: 0.2 10*3/uL (ref 0.0–0.7)
Eosinophils Relative: 3.3 % (ref 0.0–5.0)
HCT: 38.8 % (ref 36.0–46.0)
HEMOGLOBIN: 13.4 g/dL (ref 12.0–15.0)
LYMPHS ABS: 1.6 10*3/uL (ref 0.7–4.0)
Lymphocytes Relative: 28.4 % (ref 12.0–46.0)
MCHC: 34.5 g/dL (ref 30.0–36.0)
MCV: 89.8 fl (ref 78.0–100.0)
MONOS PCT: 12.5 % — AB (ref 3.0–12.0)
Monocytes Absolute: 0.7 10*3/uL (ref 0.1–1.0)
NEUTROS PCT: 55 % (ref 43.0–77.0)
Neutro Abs: 3.1 10*3/uL (ref 1.4–7.7)
Platelets: 271 10*3/uL (ref 150.0–400.0)
RBC: 4.33 Mil/uL (ref 3.87–5.11)
RDW: 14.2 % (ref 11.5–15.5)
WBC: 5.7 10*3/uL (ref 4.0–10.5)

## 2015-12-18 LAB — COMPREHENSIVE METABOLIC PANEL
ALK PHOS: 58 U/L (ref 39–117)
ALT: 13 U/L (ref 0–35)
AST: 18 U/L (ref 0–37)
Albumin: 4 g/dL (ref 3.5–5.2)
BILIRUBIN TOTAL: 0.4 mg/dL (ref 0.2–1.2)
BUN: 18 mg/dL (ref 6–23)
CO2: 29 mEq/L (ref 19–32)
Calcium: 9.5 mg/dL (ref 8.4–10.5)
Chloride: 103 mEq/L (ref 96–112)
Creatinine, Ser: 0.89 mg/dL (ref 0.40–1.20)
GFR: 64.88 mL/min (ref >60.00)
GLUCOSE: 73 mg/dL (ref 70–99)
Potassium: 4 mEq/L (ref 3.5–5.1)
SODIUM: 140 meq/L (ref 135–145)
TOTAL PROTEIN: 7.1 g/dL (ref 6.0–8.3)

## 2015-12-18 NOTE — Progress Notes (Signed)
Pre visit review using our clinic review tool, if applicable. No additional management support is needed unless otherwise documented below in the visit note. 

## 2015-12-18 NOTE — Patient Instructions (Signed)
Consider switching your Zyrtec to another antihistamine like Allegra or Claritin (generic is fine).

## 2015-12-18 NOTE — Progress Notes (Signed)
Subjective:    Patient ID: Amy Lin, female    DOB: [birth year], 82 y.o.   MRN: [id number]  HPI This is a pleasant 82 yo female who presents today with an episode of weakness and low blood pressure 2 days ago. She was taking a shower and had a sudden onset of weakness and felt like she was going to pass out. She was able to get to her bed. After several minutes she felt well enough to get up, drink some water and check her blood pressure. It was 104/40. She ate a snack and rechecked her blood pressure and it was 107/49. She ate some country ham and drank more water and felt better. Yesterday her blood pressure was on the higher side. Prior to the episode, she mowed on a riding mower, blew some leaves and washed some windows.   Has occasional chest tightness on right side of chest that comes and goes, lasts for about a minute.Had several episodes yesterday, has had one this morning. Notices it more when she is still. No radiation. No associated nausea or diaphoresis. Not associated with activity/exertion.   Head is stopped up with nasal drainage. Has taken Zyrtec for many years, not sure if it is still working.   Past Medical History:  Diagnosis Date  . Allergy   . GERD (gastroesophageal reflux disease)   . Hyperlipidemia   . Hypertension    diuretic for edema at first  . Hypothyroidism   . Osteoarthritis, multiple sites   . Osteoporosis    Past Surgical History:  Procedure Laterality Date  . ABDOMINAL HYSTERECTOMY    . BACK SURGERY  1999   Discectomy and Ray cage  . BLADDER REPAIR  ~2010  . CARPAL TUNNEL RELEASE  8/15  . COLONOSCOPY W/ BIOPSIES N/A 2016  . MENISCECTOMY Left 2/15   Dr Veverly Fells  . UPPER GI ENDOSCOPY N/A Feb 2017   Family History  Problem Relation Age of Onset  . Heart disease Mother   . Stroke Father   . Hypertension Father   . Heart disease Father   . Diabetes Father    Social History  Substance Use Topics  . Smoking status: Never Smoker  . Smokeless  tobacco: Never Used  . Alcohol use No    Review of Systems Per HPI    Objective:   Physical Exam  Constitutional: She is oriented to person, place, and time. She appears well-developed and well-nourished. No distress.  HENT:  Head: Normocephalic and atraumatic.  Eyes: Conjunctivae are normal.  Cardiovascular: Normal rate, regular rhythm and normal heart sounds.   Pulmonary/Chest: Effort normal and breath sounds normal. She exhibits no tenderness.  Musculoskeletal: She exhibits no edema.  Neurological: She is alert and oriented to person, place, and time.  Skin: Skin is warm and dry. She is not diaphoretic.  Psychiatric: She has a normal mood and affect. Her behavior is normal. Judgment and thought content normal.  Vitals reviewed.  Physical Exam  Constitutional: Oriented to person, place, and time. She appears well-developed and well-nourished.  HENT:  Head: Normocephalic and atraumatic.  Eyes: Conjunctivae are normal.  Neck: Normal range of motion. Neck supple. No bruit. Cardiovascular: Normal rate, regular rhythm and normal heart sounds.   Pulmonary/Chest: Effort normal and breath sounds normal. No pedal edema.  Musculoskeletal: Normal range of motion.  Neurological: Alert and oriented to person, place, and time.  Skin: Skin is warm and dry.  Psychiatric: Normal mood and affect. Behavior is normal. Judgment  and thought content normal.  Vitals reviewed.  BP 122/68   Pulse 76   Temp 97.4 F (36.3 C) (Oral)   Wt 154 lb 8 oz (70.1 kg)   SpO2 97%   BMI 25.32 kg/m  Wt Readings from Last 3 Encounters:  12/18/15 154 lb 8 oz (70.1 kg)  11/11/15 152 lb 8 oz (69.2 kg)  05/11/15 154 lb 8 oz (70.1 kg)   EKG- Sinus Bradycardia with rate 59    Assessment & Plan:  1. Weakness - symptoms have resolved, unsure if related to hypoglycemia, electrolyte imbalance or low diastolic pressure.  - encouraged her to eat regular meals and snacks, hydrated adequately and notify us if any  further episodes. - EKG 12-Lead - CBC with Differential/Platelet - Comprehensive metabolic panel  2. Hypotension, unspecified hypotension type - resolved today - EKG 12-Lead - CBC with Differential/Platelet - Comprehensive metabolic panel - Ambulatory referral to Cardiology  3. Chest tightness - EKG 12-Lead - Ambulatory referral to Cardiology   Clarene Reamer, FNP-BC  [facility], [place] [facility]  12/19/2015 7:23 AM

## 2015-12-22 ENCOUNTER — Ambulatory Visit (INDEPENDENT_AMBULATORY_CARE_PROVIDER_SITE_OTHER): Payer: PPO | Admitting: Cardiology

## 2015-12-22 ENCOUNTER — Encounter: Payer: Self-pay | Admitting: Cardiology

## 2015-12-22 VITALS — BP 140/80 | HR 62 | Ht 65.0 in | Wt 154.5 lb

## 2015-12-22 DIAGNOSIS — E785 Hyperlipidemia, unspecified: Secondary | ICD-10-CM | POA: Diagnosis not present

## 2015-12-22 DIAGNOSIS — R0789 Other chest pain: Secondary | ICD-10-CM | POA: Diagnosis not present

## 2015-12-22 NOTE — Progress Notes (Signed)
Cardiology Office Note   Date:  12/24/2015   ID:  Amy Lin, DOB [birth year], MRN [id number]  Referring Doctor:  Webb Silversmith, NP   Cardiologist:   Wende Bushy, MD   Reason for consultation:  Chief Complaint  Patient presents with  . other    New patient. Referred by Dr. Carlean Purl on 12/18/15 for chest tightness. Pt  Reviewed meds with pt verbally.      History of Present Illness: Amy Lin is a 82 y.o. female who presents for CP - unsure duration, prob few months, tightness, moderate intensity, minutes at a time, nonradiating, mainly R side, under the breast, deep inside, may be exertional but not all the time, no palpitations or SOB  Had one episode were she felt very weak while in the shower. She had worked in the yard for a long time that day before taking a shower. Water was lukewarm. BP was checked and it was relatively lower for her in the 100s usually 120s. No recurrence. No syncope.   No PND, orthopnea, edema, SOB.   ROS:  Please see the history of present illness. Aside from mentioned under HPI, all other systems are reviewed and negative.     Past Medical History:  Diagnosis Date  . Allergy   . GERD (gastroesophageal reflux disease)   . Hyperlipidemia   . Hypertension    diuretic for edema at first  . Hypothyroidism   . Osteoarthritis, multiple sites   . Osteoporosis     Past Surgical History:  Procedure Laterality Date  . ABDOMINAL HYSTERECTOMY    . BACK SURGERY  1999   Discectomy and Ray cage  . BLADDER REPAIR  ~2010  . CARPAL TUNNEL RELEASE  8/15  . COLONOSCOPY W/ BIOPSIES N/A 2016  . MENISCECTOMY Left 2/15   Dr Veverly Fells  . UPPER GI ENDOSCOPY N/A Feb 2017     reports that she has never smoked. She has never used smokeless tobacco. She reports that she does not drink alcohol or use drugs.   family history includes Diabetes in her father; Heart disease in her father and mother; Hypertension in her father; Stroke in her father.    Outpatient Medications Prior to Visit  Medication Sig Dispense Refill  . aspirin 81 MG EC tablet Take 81 mg by mouth daily.      . cetirizine (ZYRTEC) 10 MG tablet Take 10 mg by mouth daily.      . Cholecalciferol (VITAMIN D3) 2000 UNITS TABS Take by mouth.      . Cyanocobalamin (VITAMIN B 12 PO) Take by mouth daily.    Marland Kitchen DEXILANT 60 MG capsule Take 60 mg by mouth daily.     . furosemide (LASIX) 20 MG tablet TAKE 1/2 TABLET BY MOUTH DAILY 30 tablet 1  . levothyroxine (SYNTHROID, LEVOTHROID) 75 MCG tablet TAKE 1 TABLET BY MOUTH ONCE A DAY 30 tablet 1  . OVER THE COUNTER MEDICATION Take 1 capsule by mouth daily. Probiotic    . rosuvastatin (CRESTOR) 20 MG tablet Take 1 tablet (20 mg total) by mouth daily. 30 tablet 2  . calcium-vitamin D (OSCAL WITH D) 500-200 MG-UNIT per tablet Take 1 tablet by mouth daily.       No facility-administered medications prior to visit.      Allergies: Patient has no known allergies.    PHYSICAL EXAM: VS:  BP 140/80 (BP Location: Right Arm, Patient Position: Sitting, Cuff Size: Normal)   Pulse 62   Ht 5'  5" (1.651 m)   Wt 154 lb 8 oz (70.1 kg)   BMI 25.71 kg/m  , Body mass index is 25.71 kg/m. Wt Readings from Last 3 Encounters:  12/22/15 154 lb 8 oz (70.1 kg)  12/18/15 154 lb 8 oz (70.1 kg)  11/11/15 152 lb 8 oz (69.2 kg)    GENERAL:  well developed, well nourished, not in acute distress HEENT: normocephalic, pink conjunctivae, anicteric sclerae, no xanthelasma, normal dentition, oropharynx clear NECK:  no neck vein engorgement, JVP normal, no hepatojugular reflux, carotid upstroke brisk and symmetric, no bruit, no thyromegaly, no lymphadenopathy LUNGS:  good respiratory effort, clear to auscultation bilaterally CV:  PMI not displaced, no thrills, no lifts, S1 and S2 within normal limits, no palpable S3 or S4, no murmurs, no rubs, no gallops ABD:  Soft, nontender, nondistended, normoactive bowel sounds, no abdominal aortic bruit, no hepatomegaly,  no splenomegaly MS: nontender back, no kyphosis, no scoliosis, no joint deformities EXT:  2+ DP/PT pulses, no edema, no varicosities, no cyanosis, no clubbing SKIN: warm, nondiaphoretic, normal turgor, no ulcers NEUROPSYCH: alert, oriented to person, place, and time, sensory/motor grossly intact, normal mood, appropriate affect  Recent Labs: 11/11/2015: TSH 1.70 12/18/2015: ALT 13; BUN 18; Creatinine, Ser 0.89; Hemoglobin 13.4; Platelets 271.0; Potassium 4.0; Sodium 140   Lipid Panel    Component Value Date/Time   CHOL 243 (H) 11/11/2015 1413   TRIG 107.0 11/11/2015 1413   TRIG 103 12/06/2005 1055   HDL 55.10 11/11/2015 1413   CHOLHDL 4 11/11/2015 1413   VLDL 21.4 11/11/2015 1413   LDLCALC 167 (H) 11/11/2015 1413   LDLDIRECT 156.0 11/06/2014 0932     Other studies Reviewed:  EKG:  The ekg from 12/22/2015 was personally reviewed by me and it revealed sinus rhythm, 62 BPM. Low-voltage QRS. Borderline EKG.  Additional studies/ records that were reviewed personally reviewed by me today include: none available   ASSESSMENT AND PLAN: CP Atypical but several risk factors including age, hyperlipidemia, family hx rec further eval with echo and ex nuc stress test (poss modified bruce)  Low BP - may have been orthostatic at that time/? Dehydration. Monitor for recurrence    Current medicines are reviewed at length with the patient today.  The patient does not have concerns regarding medicines.  Labs/ tests ordered today include:  Orders Placed This Encounter  Procedures  . NM Myocar Multi W/Spect W/Wall Motion / EF  . EKG 12-Lead  . ECHOCARDIOGRAM COMPLETE    I had a lengthy and detailed discussion with the patient regarding diagnoses, prognosis, diagnostic options, treatment options , and side effects of medications.   I counseled the patient on importance of lifestyle modification including heart healthy diet, regular physical activity once cardiac work up  completed   Disposition:   FU with undersigned after tests    Signed, Wende Bushy, MD  12/24/2015 7:44 AM    [place]  This note was generated in part with voice recognition software and I apologize for any typographical errors that were not detected and corrected.

## 2015-12-22 NOTE — Patient Instructions (Addendum)
Testing/Procedures: Your physician has requested that you have an echocardiogram. Echocardiography is a painless test that uses sound waves to create images of your heart. It provides your doctor with information about the size and shape of your heart and how well your heart's chambers and valves are working. This procedure takes approximately one hour. There are no restrictions for this procedure.  [place]  Your caregiver has ordered a Stress Test with nuclear imaging. The purpose of this test is to evaluate the blood supply to your heart muscle. This procedure is referred to as a "Non-Invasive Stress Test." This is because other than having an IV started in your vein, nothing is inserted or "invades" your body. Cardiac stress tests are done to find areas of poor blood flow to the heart by determining the extent of coronary artery disease (CAD).    Please note: these test may take anywhere between 2-4 hours to complete  PLEASE REPORT TO [facility] AT THE FIRST DESK WILL DIRECT YOU WHERE TO GO  Date of Procedure:_Thursday December 31, 2015 at 08:30AM_  Arrival Time for Procedure:_Arrive at 08:15AM to register____  Instructions regarding medication:   _X___ : Hold lasix (Furosemide) until after your procedure.    PLEASE NOTIFY THE OFFICE AT LEAST 39 HOURS IN ADVANCE IF YOU ARE UNABLE TO KEEP YOUR APPOINTMENT.  [phone] AND  PLEASE NOTIFY NUCLEAR MEDICINE AT [facility] AT LEAST 24 HOURS IN ADVANCE IF YOU ARE UNABLE TO KEEP YOUR APPOINTMENT. [phone]  How to prepare for your Myoview test:  1. Do not eat or drink after midnight 2. No caffeine for 24 hours prior to test 3. No smoking 24 hours prior to test. 4. Your medication may be taken with water.  If your doctor stopped a medication because of this test, do not take that medication. 5. Ladies, please do not wear dresses.  Skirts or pants are appropriate. Please wear a short sleeve shirt. 6. No  perfume, cologne or lotion. 7. Wear comfortable walking shoes. No heels!       Follow-Up: Your physician recommends that you schedule a follow-up appointment as needed with Dr. Yvone Neu. We will call you with results and if needed schedule follow up at that time.   It was a pleasure seeing you today here in the office. Please do not hesitate to give [place] a call back if you have any further questions. [place], BSN     Echocardiogram An echocardiogram, or echocardiography, uses sound waves (ultrasound) to produce an image of your heart. The echocardiogram is simple, painless, obtained within a short period of time, and offers valuable information to your health care provider. The images from an echocardiogram can provide information such as:  Evidence of coronary artery disease (CAD).  Heart size.  Heart muscle function.  Heart valve function.  Aneurysm detection.  Evidence of a past heart attack.  Fluid buildup around the heart.  Heart muscle thickening.  Assess heart valve function. Tell a health care provider about:  Any allergies you have.  All medicines you are taking, including vitamins, herbs, eye drops, creams, and over-the-counter medicines.  Any problems you or family members have had with anesthetic medicines.  Any blood disorders you have.  Any surgeries you have had.  Any medical conditions you have.  Whether you are pregnant or may be pregnant. What happens before the procedure? No special preparation is needed. Eat and drink normally. What happens during the procedure?  In order to produce an image of your heart, gel will be applied to your chest and a wand-like tool (transducer) will be moved over your chest. The gel will help transmit the sound waves from the transducer. The sound waves will harmlessly bounce off your heart to allow the heart images to be captured in real-time motion. These images will then be recorded.  You may  need an IV to receive a medicine that improves the quality of the pictures. What happens after the procedure? You may return to your normal schedule including diet, activities, and medicines, unless your health care provider tells you otherwise. This information is not intended to replace advice given to you by your health care provider. Make sure you discuss any questions you have with your health care provider. Document Released: 01/15/2000 Document Revised: 09/05/2015 Document Reviewed: 09/24/2012 Elsevier Interactive Patient Education  2017 [place]. Cardiac Nuclear Scanning A cardiac nuclear scan is used to check your heart for problems, such as the following:  A portion of the heart is not getting enough blood.  Part of the heart muscle has died, which happens with a heart attack.  The heart wall is not working normally.  In this test, a radioactive dye (tracer) is injected into your bloodstream. After the tracer has traveled to your heart, a scanning device is used to measure how much of the tracer is absorbed by or distributed to various areas of your heart. LET [facility] CARE PROVIDER KNOW ABOUT:  Any allergies you have.  All medicines you are taking, including vitamins, herbs, eye drops, creams, and over-the-counter medicines.  Previous problems you or members of your family have had with the use of anesthetics.  Any blood disorders you have.  Previous surgeries you have had.  Medical conditions you have.  RISKS AND COMPLICATIONS Generally, this is a safe procedure. However, as with any procedure, problems can occur. Possible problems include:   Serious chest pain.  Rapid heartbeat.  Sensation of warmth in your chest. This usually passes quickly. BEFORE THE PROCEDURE Ask your health care provider about changing or stopping your regular medicines. PROCEDURE This procedure is usually done at a hospital and takes 2-4 hours.  An IV tube is inserted into one of  your veins.  Your health care provider will inject a small amount of radioactive tracer through the tube.  You will then wait for 20-40 minutes while the tracer travels through your bloodstream.  You will lie down on an exam table so images of your heart can be taken. Images will be taken for about 15-20 minutes.  You will exercise on a treadmill or stationary bike. While you exercise, your heart activity will be monitored with an electrocardiogram (ECG), and your blood pressure will be checked.  If you are unable to exercise, you may be given a medicine to make your heart beat faster.  When blood flow to your heart has peaked, tracer will again be injected through the IV tube.  After 20-40 minutes, you will get back on the exam table and have more images taken of your heart.  When the procedure is over, your IV tube will be removed. AFTER THE PROCEDURE  You will likely be able to leave shortly after the test. Unless your health care provider tells you otherwise, you may return to your normal schedule, including diet, activities, and medicines.  Make sure you find out how and when you will get your test results. This information is not intended to  replace advice given to you by your health care provider. Make sure you discuss any questions you have with your health care provider. Document Released: 02/12/2004 Document Revised: 01/22/2013 Document Reviewed: 12/26/2012 Elsevier Interactive Patient Education  2017 [employer].

## 2015-12-31 ENCOUNTER — Encounter: Admission: RE | Admit: 2015-12-31 | Payer: PPO | Source: Ambulatory Visit

## 2016-01-04 ENCOUNTER — Encounter
Admission: RE | Admit: 2016-01-04 | Discharge: 2016-01-04 | Disposition: A | Discharge status: Home / Self Care | Payer: PPO | Source: Ambulatory Visit | Attending: Cardiology | Admitting: Cardiology

## 2016-01-04 DIAGNOSIS — R0789 Other chest pain: Secondary | ICD-10-CM | POA: Insufficient documentation

## 2016-01-04 DIAGNOSIS — E785 Hyperlipidemia, unspecified: Secondary | ICD-10-CM | POA: Diagnosis not present

## 2016-01-04 LAB — NM MYOCAR MULTI W/SPECT W/WALL MOTION / EF
CSEPEDS: 45 s
CSEPEW: 6.1 METS
CSEPHR: 91 %
Exercise duration (min): 4 min
LV dias vol: 61 mL (ref 46–106)
LV sys vol: 17 mL
NUC STRESS TID: 0.78
Peak HR: 129 {beats}/min
Rest HR: 63 {beats}/min
SDS: 0
SRS: 1
SSS: 0

## 2016-01-04 MED ORDER — TECHNETIUM TC 99M TETROFOSMIN IV KIT
13.0000 | PACK | Freq: Once | INTRAVENOUS | Status: AC | PRN
  Administered 2016-01-04: 13.09 via INTRAVENOUS

## 2016-01-04 MED ORDER — TECHNETIUM TC 99M TETROFOSMIN IV KIT
33.0000 | PACK | Freq: Once | INTRAVENOUS | Status: AC | PRN
  Administered 2016-01-04: 28.767 via INTRAVENOUS

## 2016-01-05 ENCOUNTER — Ambulatory Visit
Admission: RE | Admit: 2016-01-05 | Discharge: 2016-01-05 | Disposition: A | Discharge status: Home / Self Care | Payer: PPO | Source: Ambulatory Visit | Attending: Internal Medicine | Admitting: Internal Medicine

## 2016-01-05 DIAGNOSIS — M8588 Other specified disorders of bone density and structure, other site: Secondary | ICD-10-CM | POA: Diagnosis not present

## 2016-01-05 DIAGNOSIS — M85851 Other specified disorders of bone density and structure, right thigh: Secondary | ICD-10-CM | POA: Insufficient documentation

## 2016-01-05 DIAGNOSIS — Z1231 Encounter for screening mammogram for malignant neoplasm of breast: Secondary | ICD-10-CM | POA: Diagnosis not present

## 2016-01-05 DIAGNOSIS — Z780 Asymptomatic menopausal state: Secondary | ICD-10-CM | POA: Insufficient documentation

## 2016-01-05 DIAGNOSIS — M810 Age-related osteoporosis without current pathological fracture: Secondary | ICD-10-CM | POA: Diagnosis not present

## 2016-01-21 ENCOUNTER — Other Ambulatory Visit: Payer: Self-pay

## 2016-01-21 ENCOUNTER — Ambulatory Visit (INDEPENDENT_AMBULATORY_CARE_PROVIDER_SITE_OTHER): Payer: PPO

## 2016-01-21 DIAGNOSIS — R0789 Other chest pain: Secondary | ICD-10-CM | POA: Diagnosis not present

## 2016-01-21 DIAGNOSIS — E785 Hyperlipidemia, unspecified: Secondary | ICD-10-CM

## 2016-02-05 DIAGNOSIS — H0014 Chalazion left upper eyelid: Secondary | ICD-10-CM | POA: Diagnosis not present

## 2016-02-09 DIAGNOSIS — H0014 Chalazion left upper eyelid: Secondary | ICD-10-CM | POA: Diagnosis not present

## 2016-02-24 ENCOUNTER — Encounter: Payer: Self-pay | Admitting: Cardiology

## 2016-02-24 ENCOUNTER — Ambulatory Visit (INDEPENDENT_AMBULATORY_CARE_PROVIDER_SITE_OTHER): Payer: PPO | Admitting: Cardiology

## 2016-02-24 VITALS — BP 174/88 | HR 66 | Ht 65.0 in | Wt 160.0 lb

## 2016-02-24 DIAGNOSIS — E785 Hyperlipidemia, unspecified: Secondary | ICD-10-CM

## 2016-02-24 DIAGNOSIS — R0789 Other chest pain: Secondary | ICD-10-CM | POA: Diagnosis not present

## 2016-02-24 NOTE — Patient Instructions (Signed)
Follow-Up: Your physician recommends that you schedule a follow-up appointment as needed with Dr. Ingal.   It was a pleasure seeing you today here in the office. Please do not hesitate to give us a call back if you have any further questions. [phone]  Pamela A. RN, BSN     

## 2016-02-24 NOTE — Progress Notes (Signed)
Cardiology Office Note   Date:  02/25/2016   ID:  Amy Lin, DOB [birth year], MRN [id number]  Referring Doctor:  Webb Silversmith, NP   Cardiologist:   Wende Bushy, MD   Reason for consultation:  Chief Complaint  Patient presents with  . other     would like to come in talk about echo/myoview. Pt states she is doing well. Reviewed meds with pt verbally.      History of Present Illness: Amy Lin is a 83 y.o. female who presents for Follow-up after test results.  Currently, patient has been doing quite well. No chest pains, shortness of breath, applications, lightheadedness, dizziness.  No PND, orthopnea, edema.  ROS:  Please see the history of present illness. Aside from mentioned under HPI, all other systems are reviewed and negative.    Past Medical History:  Diagnosis Date  . Allergy   . GERD (gastroesophageal reflux disease)   . Hyperlipidemia   . Hypertension    diuretic for edema at first  . Hypothyroidism   . Osteoarthritis, multiple sites   . Osteoporosis     Past Surgical History:  Procedure Laterality Date  . ABDOMINAL HYSTERECTOMY    . BACK SURGERY  1999   Discectomy and Ray cage  . BLADDER REPAIR  ~2010  . CARPAL TUNNEL RELEASE  8/15  . COLONOSCOPY W/ BIOPSIES N/A 2016  . MENISCECTOMY Left 2/15   Dr Veverly Fells  . UPPER GI ENDOSCOPY N/A Feb 2017     reports that she has never smoked. She has never used smokeless tobacco. She reports that she does not drink alcohol or use drugs.   family history includes Diabetes in her father; Heart disease in her father and mother; Hypertension in her father; Stroke in her father.   Outpatient Medications Prior to Visit  Medication Sig Dispense Refill  . aspirin 81 MG EC tablet Take 81 mg by mouth daily.      . cetirizine (ZYRTEC) 10 MG tablet Take 10 mg by mouth daily.      . Cholecalciferol (VITAMIN D3) 2000 UNITS TABS Take by mouth.      . Cyanocobalamin (VITAMIN B 12 PO) Take by mouth daily.    Marland Kitchen  DEXILANT 60 MG capsule Take 60 mg by mouth daily.     . furosemide (LASIX) 20 MG tablet TAKE 1/2 TABLET BY MOUTH DAILY 30 tablet 1  . levothyroxine (SYNTHROID, LEVOTHROID) 75 MCG tablet TAKE 1 TABLET BY MOUTH ONCE A DAY 30 tablet 1  . OVER THE COUNTER MEDICATION Take 1 capsule by mouth daily. Probiotic    . rosuvastatin (CRESTOR) 20 MG tablet Take 1 tablet (20 mg total) by mouth daily. 30 tablet 2   No facility-administered medications prior to visit.      Allergies: Patient has no known allergies.    PHYSICAL EXAM: VS:  BP (!) 174/88 (BP Location: Left Arm, Patient Position: Sitting, Cuff Size: Normal)   Pulse 66   Ht _0  (1.651 m)   Wt 160 lb (72.6 kg)   BMI 26.63 kg/m  , Body mass index is 26.63 kg/m. Wt Readings from Last 3 Encounters:  02/24/16 160 lb (72.6 kg)  12/22/15 154 lb 8 oz (70.1 kg)  12/18/15 154 lb 8 oz (70.1 kg)    GENERAL:  well developed, well nourished, not in acute distress HEENT: normocephalic, pink conjunctivae, anicteric sclerae, no xanthelasma, normal dentition, oropharynx clear NECK:  no neck vein engorgement, JVP normal, no hepatojugular reflux,  carotid upstroke brisk and symmetric, no bruit, no thyromegaly, no lymphadenopathy LUNGS:  good respiratory effort, clear to auscultation bilaterally CV:  PMI not displaced, no thrills, no lifts, S1 and S2 within normal limits, no palpable S3 or S4, no murmurs, no rubs, no gallops ABD:  Soft, nontender, nondistended, normoactive bowel sounds, no abdominal aortic bruit, no hepatomegaly, no splenomegaly MS: nontender back, no kyphosis, no scoliosis, no joint deformities EXT:  2+ DP/PT pulses, no edema, no varicosities, no cyanosis, no clubbing SKIN: warm, nondiaphoretic, normal turgor, no ulcers NEUROPSYCH: alert, oriented to person, place, and time, sensory/motor grossly intact, normal mood, appropriate affect   Recent Labs: 11/11/2015: TSH 1.70 12/18/2015: ALT 13; BUN 18; Creatinine, Ser 0.89; Hemoglobin  13.4; Platelets 271.0; Potassium 4.0; Sodium 140   Lipid Panel    Component Value Date/Time   CHOL 243 (H) 11/11/2015 1413   TRIG 107.0 11/11/2015 1413   TRIG 103 12/06/2005 1055   HDL 55.10 11/11/2015 1413   CHOLHDL 4 11/11/2015 1413   VLDL 21.4 11/11/2015 1413   LDLCALC 167 (H) 11/11/2015 1413   LDLDIRECT 156.0 11/06/2014 0932     Other studies Reviewed:  EKG:  The ekg from 12/22/2015 was personally reviewed by me and it revealed sinus rhythm, 62 BPM. Low-voltage QRS. Borderline EKG.  Additional studies/ records that were reviewed personally reviewed by me today include:  Echo 01/21/2016: Left ventricle: The cavity size was normal. There was moderate   concentric hypertrophy. Systolic function was normal. The   estimated ejection fraction was in the range of 55% to 60%. Wall   motion was normal; there were no regional wall motion   abnormalities. Doppler parameters are consistent with abnormal   left ventricular relaxation (grade 1 diastolic dysfunction). - Mitral valve: Calcified annulus. - Left atrium: The atrium was mildly dilated.  Nuclear stress is 01/04/2016:  There was no ST segment deviation noted during stress.  No T wave inversion was noted during stress.  Defect 1: There is a small defect of mild severity present in the apex location. This is likely due to breast attenuation.  The study is normal.  This is a low risk study.  The left ventricular ejection fraction is normal (55-65%).     ASSESSMENT AND PLAN: CP Atypical but several risk factors including age, hyperlipidemia, family hx No recurrence since last visit. Discussed the details of the stress test and echocardiogram. No evidence of ischemia on the stress test. We went over what the defect fragment, consistent with breast attenuation. This was explained as an artifact of the study. Reassuring study, low likelihood of clinically significant CAD.  Hypertrophy Discussed details of echocardiogram.  Went over all the information included in the echocardiogram. Patient was reassured that her heart function is okay. There was concentric hypertrophy, likely related to hypertension. Recommend  close monitoring of blood pressure. She is aware that her blood pressure is much higher today. She reports taking decongestant over-the-counter, NSAID over-the-counter. Also had ham this morning. Recommend blood pressure monitoring and lifestyle and dietary changes. Low-sodium diet. Also echo reports diastolic dysfunction, there is no clinical or physical exam evidence of heart failure. Also she does not report symptoms consistent with congestive heart failure. This explained her in detail.   MAC Patient already taking aspirin 81 mg by mouth daily.  Hyperlipidemia Due to statin therapy. PCP following labs.  Current medicines are reviewed at length with the patient today.  The patient does not have concerns regarding medicines.  Labs/ tests ordered today  include:  No orders of the defined types were placed in this encounter.   I had a lengthy and detailed discussion with the patient regarding diagnoses, prognosis, diagnostic options, treatment options , and side effects of medications.   I counseled the patient on importance of lifestyle modification including heart healthy diet, regular physical activity .   Disposition:   FU with undersigned prn  I spent at least 25 minutes with the patient today and more than 50% of the time was spent counseling the patient and coordinating care.    Signed, Wende Bushy, MD  02/25/2016 9:15 AM    [place]  This note was generated in part with voice recognition software and I apologize for any typographical errors that were not detected and corrected.

## 2016-02-25 ENCOUNTER — Ambulatory Visit: Payer: PPO | Admitting: Cardiology

## 2016-03-04 ENCOUNTER — Other Ambulatory Visit: Payer: Self-pay | Admitting: Internal Medicine

## 2016-03-04 DIAGNOSIS — E039 Hypothyroidism, unspecified: Secondary | ICD-10-CM

## 2016-04-21 ENCOUNTER — Other Ambulatory Visit: Payer: Self-pay | Admitting: Internal Medicine

## 2016-05-04 ENCOUNTER — Other Ambulatory Visit: Payer: Self-pay | Admitting: Internal Medicine

## 2016-05-04 DIAGNOSIS — E039 Hypothyroidism, unspecified: Secondary | ICD-10-CM

## 2016-05-11 ENCOUNTER — Ambulatory Visit (INDEPENDENT_AMBULATORY_CARE_PROVIDER_SITE_OTHER): Payer: PPO | Admitting: Primary Care

## 2016-05-11 ENCOUNTER — Encounter: Payer: Self-pay | Admitting: Primary Care

## 2016-05-11 VITALS — BP 122/70 | HR 72 | Temp 97.8°F | Wt 161.8 lb

## 2016-05-11 DIAGNOSIS — J069 Acute upper respiratory infection, unspecified: Secondary | ICD-10-CM

## 2016-05-11 DIAGNOSIS — B9789 Other viral agents as the cause of diseases classified elsewhere: Secondary | ICD-10-CM | POA: Diagnosis not present

## 2016-05-11 NOTE — Patient Instructions (Signed)
Your symptoms are representative of a viral illness which will resolve on its own over time. Our goal is to treat your symptoms in order to aid your body in the healing process and to make you more comfortable.   Cough/Congestion: Try taking Mucinex DM. This will help loosen up the mucous in your chest. Ensure you take this medication with a full glass of water.  Continue Zyrtec.  Try saline nasal spray for nasal congestion/pressure. You could alternate with Flonase (fluticasone).   Please notify me if you develop persistent fevers of 101, start coughing up green mucous, notice increased fatigue or weakness, or feel worse after Monday next week.  Increase consumption of water intake and rest.  It was a pleasure meeting you!   Upper Respiratory Infection, Adult Most upper respiratory infections (URIs) are a viral infection of the air passages leading to the lungs. A URI affects the nose, throat, and upper air passages. The most common type of URI is nasopharyngitis and is typically referred to as "the common cold." URIs run their course and usually go away on their own. Most of the time, a URI does not require medical attention, but sometimes a bacterial infection in the upper airways can follow a viral infection. This is called a secondary infection. Sinus and middle ear infections are common types of secondary upper respiratory infections. Bacterial pneumonia can also complicate a URI. A URI can worsen asthma and chronic obstructive pulmonary disease (COPD). Sometimes, these complications can require emergency medical care and may be life threatening. What are the causes? Almost all URIs are caused by viruses. A virus is a type of germ and can spread from one person to another. What increases the risk? You may be at risk for a URI if:  You smoke.  You have chronic heart or lung disease.  You have a weakened defense (immune) system.  You are very young or very old.  You have nasal  allergies or asthma.  You work in crowded or poorly ventilated areas.  You work in health care facilities or schools. What are the signs or symptoms? Symptoms typically develop 2-3 days after you come in contact with a cold virus. Most viral URIs last 7-10 days. However, viral URIs from the influenza virus (flu virus) can last 14-18 days and are typically more severe. Symptoms may include:  Runny or stuffy (congested) nose.  Sneezing.  Cough.  Sore throat.  Headache.  Fatigue.  Fever.  Loss of appetite.  Pain in your forehead, behind your eyes, and over your cheekbones (sinus pain).  Muscle aches. How is this diagnosed? Your health care provider may diagnose a URI by:  Physical exam.  Tests to check that your symptoms are not due to another condition such as:  Strep throat.  Sinusitis.  Pneumonia.  Asthma. How is this treated? A URI goes away on its own with time. It cannot be cured with medicines, but medicines may be prescribed or recommended to relieve symptoms. Medicines may help:  Reduce your fever.  Reduce your cough.  Relieve nasal congestion. Follow these instructions at home:  Take medicines only as directed by your health care provider.  Gargle warm saltwater or take cough drops to comfort your throat as directed by your health care provider.  Use a warm mist humidifier or inhale steam from a shower to increase air moisture. This may make it easier to breathe.  Drink enough fluid to keep your urine clear or pale yellow.  Eat soups  and other clear broths and maintain good nutrition.  Rest as needed.  Return to work when your temperature has returned to normal or as your health care provider advises. You may need to stay home longer to avoid infecting others. You can also use a face mask and careful hand washing to prevent spread of the virus.  Increase the usage of your inhaler if you have asthma.  Do not use any tobacco products, including  cigarettes, chewing tobacco, or electronic cigarettes. If you need help quitting, ask your health care provider. How is this prevented? The best way to protect yourself from getting a cold is to practice good hygiene.  Avoid oral or hand contact with people with cold symptoms.  Wash your hands often if contact occurs. There is no clear evidence that vitamin C, vitamin E, echinacea, or exercise reduces the chance of developing a cold. However, it is always recommended to get plenty of rest, exercise, and practice good nutrition. Contact a health care provider if:  You are getting worse rather than better.  Your symptoms are not controlled by medicine.  You have chills.  You have worsening shortness of breath.  You have brown or red mucus.  You have yellow or brown nasal discharge.  You have pain in your face, especially when you bend forward.  You have a fever.  You have swollen neck glands.  You have pain while swallowing.  You have white areas in the back of your throat. Get help right away if:  You have severe or persistent:  Headache.  Ear pain.  Sinus pain.  Chest pain.  You have chronic lung disease and any of the following:  Wheezing.  Prolonged cough.  Coughing up blood.  A change in your usual mucus.  You have a stiff neck.  You have changes in your:  Vision.  Hearing.  Thinking.  Mood. This information is not intended to replace advice given to you by your health care provider. Make sure you discuss any questions you have with your health care provider. Document Released: 07/13/2000 Document Revised: 09/20/2015 Document Reviewed: 04/24/2013 Elsevier Interactive Patient Education  2017 [employer].

## 2016-05-11 NOTE — Progress Notes (Signed)
Pre visit review using our clinic review tool, if applicable. No additional management support is needed unless otherwise documented below in the visit note. 

## 2016-05-11 NOTE — Progress Notes (Signed)
Subjective:    Patient ID: Amy Lin, female    DOB: 30-Mar-1933, 83 y.o.   MRN: [id number]  HPI  Ms. Willcox is an 83 year old female with a history of seasonal allergies who presents today with a chief complaint of nasal congestion. She also reports headache, ear pain, cough. Her symptoms began 5-6 days ago. She was out in her yard 5-6 days ago picking up sticks. She's been taking Zyrtec daily, Advil with temporary improvement. Her cough is productive with light yellow sputum. She denies fevers, nausea, abdominal pain.   She also thinks she was bitten by an insect last week while working in the yard as she's noticed several bites on her right lower extremity. The bites did form blisters for which she popped herself. These bites are itchy and painful. She's applied rubbing alcohol and polysporin.   Review of Systems  Constitutional: Positive for chills and fatigue. Negative for fever.  HENT: Positive for congestion and ear pain. Negative for sore throat.   Respiratory: Positive for cough. Negative for shortness of breath and wheezing.   Cardiovascular: Negative for chest pain.  Gastrointestinal: Negative for abdominal pain.  Neurological: Positive for headaches.       Past Medical History:  Diagnosis Date  . Allergy   . GERD (gastroesophageal reflux disease)   . Hyperlipidemia   . Hypertension    diuretic for edema at first  . Hypothyroidism   . Osteoarthritis, multiple sites   . Osteoporosis      Social History   Social History  . Marital status: Divorced    Spouse name: N/A  . Number of children: 3  . Years of education: N/A   Occupational History  . Office supervisor     [employer]   Social History Main Topics  . Smoking status: Never Smoker  . Smokeless tobacco: Never Used  . Alcohol use No  . Drug use: No  . Sexual activity: No   Other Topics Concern  . Not on file   Social History Narrative   3 children--- 1 died   Lives alone      No living will   Requests son and daughter as health care POAs   Would accept resuscitation attempts but no prolonged artificial ventilation   Not sure about tube feeds    Past Surgical History:  Procedure Laterality Date  . ABDOMINAL HYSTERECTOMY    . BACK SURGERY  1999   Discectomy and Ray cage  . BLADDER REPAIR  ~2010  . CARPAL TUNNEL RELEASE  8/15  . COLONOSCOPY W/ BIOPSIES N/A 2016  . MENISCECTOMY Left 2/15   Dr Veverly Fells  . UPPER GI ENDOSCOPY N/A Feb 2017    Family History  Problem Relation Age of Onset  . Heart disease Mother   . Stroke Father   . Hypertension Father   . Heart disease Father   . Diabetes Father     No Known Allergies  Current Outpatient Prescriptions on File Prior to Visit  Medication Sig Dispense Refill  . aspirin 81 MG EC tablet Take 81 mg by mouth daily.      . cetirizine (ZYRTEC) 10 MG tablet Take 10 mg by mouth daily.      . Cholecalciferol (VITAMIN D3) 2000 UNITS TABS Take by mouth.      . Cyanocobalamin (VITAMIN B 12 PO) Take by mouth daily.    Marland Kitchen DEXILANT 60 MG capsule Take 60 mg by mouth daily.     Marland Kitchen  furosemide (LASIX) 20 MG tablet TAKE 1/2 A TABLET BY MOUTH ONCE DAILY. 30 tablet 1  . levothyroxine (SYNTHROID, LEVOTHROID) 75 MCG tablet TAKE 1 TABLET BY MOUTH ONCE DAILY 30 tablet 5  . OVER THE COUNTER MEDICATION Take 1 capsule by mouth daily. Probiotic    . rosuvastatin (CRESTOR) 20 MG tablet Take 1 tablet (20 mg total) by mouth daily. 30 tablet 2   No current facility-administered medications on file prior to visit.     BP 122/70   Pulse 72   Temp 97.8 F (36.6 C) (Oral)   Wt 161 lb 12 oz (73.4 kg)   SpO2 98%   BMI 26.92 kg/m    Objective:   Physical Exam  Constitutional: She appears well-nourished. She does not appear ill.  HENT:  Right Ear: Tympanic membrane and ear canal normal.  Left Ear: Tympanic membrane and ear canal normal.  Nose: Right sinus exhibits no maxillary sinus tenderness and no frontal sinus tenderness. Left sinus exhibits no  maxillary sinus tenderness and no frontal sinus tenderness.  Mouth/Throat: Oropharynx is clear and moist.  Eyes: Conjunctivae are normal.  Neck: Neck supple.  Cardiovascular: Normal rate and regular rhythm.   Pulmonary/Chest: Effort normal and breath sounds normal. She has no wheezes. She has no rales.  Lymphadenopathy:    She has no cervical adenopathy.  Skin: Skin is warm and dry.  Small insect looking bite to right anterior thigh, small insect looking bite to right posterior thigh. No s/s of infection.          Assessment & Plan:  URI:  Cough, congestion, nasal congestion x 5-6 days. Exam today without suspicion for bacterial involvement. Dry cough during exam. Suspect viral involvement and will treat with supportive measures. Discussed use of Mucinex, saline nasal spray, Delsym. Fluids, rest, follow up PRN.  Insect bites appear stable. Discussed use of cortisone cream OTC.  Sheral Flow, NP

## 2016-05-12 DIAGNOSIS — Z961 Presence of intraocular lens: Secondary | ICD-10-CM | POA: Diagnosis not present

## 2016-05-12 DIAGNOSIS — H26493 Other secondary cataract, bilateral: Secondary | ICD-10-CM | POA: Diagnosis not present

## 2016-05-18 ENCOUNTER — Ambulatory Visit (INDEPENDENT_AMBULATORY_CARE_PROVIDER_SITE_OTHER): Payer: PPO | Admitting: Internal Medicine

## 2016-05-18 ENCOUNTER — Encounter: Payer: Self-pay | Admitting: Internal Medicine

## 2016-05-18 VITALS — BP 126/72 | HR 68 | Temp 97.5°F | Wt 161.8 lb

## 2016-05-18 DIAGNOSIS — S80862D Insect bite (nonvenomous), left lower leg, subsequent encounter: Secondary | ICD-10-CM | POA: Diagnosis not present

## 2016-05-18 DIAGNOSIS — W57XXXD Bitten or stung by nonvenomous insect and other nonvenomous arthropods, subsequent encounter: Secondary | ICD-10-CM | POA: Diagnosis not present

## 2016-05-18 DIAGNOSIS — J0110 Acute frontal sinusitis, unspecified: Secondary | ICD-10-CM | POA: Diagnosis not present

## 2016-05-18 MED ORDER — AMOXICILLIN 500 MG PO CAPS: 500.0000 mg | ORAL_CAPSULE | Freq: Three times a day (TID) | ORAL | 0 refills | Status: DC

## 2016-05-18 NOTE — Progress Notes (Signed)
HPI  Pt presents to the clinic today with c/o right side facial pain and pressure, right ear fullness, nasal congestion and cough. This started almost 2 weeks ago. She is blowing clear sometimes yellow mucous out of her nose. The cough is productive of yellow mucous. She denies fever, chills, or body aches. She was seen for the same by Allie Bossier 05/11/16. She was diagnosed with a viral URI. She was advised to try nasal Saline, Zyrtec and Mucinex, but she reports little improvement if any with this regimen. She does have a history of seasonal allergies. She has not had sick contacts.  She also c/o an insect bite which she incurred about 2 weeks ago. The area has been a little red but not warm or tender. The redness has not spread. She has been putting Neosporin on the affected area. She reports it is looking better but she just wants to have it looked at.  Review of Systems     Past Medical History:  Diagnosis Date  . Allergy   . GERD (gastroesophageal reflux disease)   . Hyperlipidemia   . Hypertension    diuretic for edema at first  . Hypothyroidism   . Osteoarthritis, multiple sites   . Osteoporosis     Family History  Problem Relation Age of Onset  . Heart disease Mother   . Stroke Father   . Hypertension Father   . Heart disease Father   . Diabetes Father     Social History   Social History  . Marital status: Divorced    Spouse name: N/A  . Number of children: 3  . Years of education: N/A   Occupational History  . Office supervisor     [employer]   Social History Main Topics  . Smoking status: Never Smoker  . Smokeless tobacco: Never Used  . Alcohol use No  . Drug use: No  . Sexual activity: No   Other Topics Concern  . Not on file   Social History Narrative   3 children--- 1 died   Lives alone      No living will   Requests son and daughter as health care POAs   Would accept resuscitation attempts but no prolonged artificial ventilation   Not sure about  tube feeds    No Known Allergies   Constitutional: Positive headache. Denies fatigue, fever or abrupt weight changes.  HEENT:  Positive facial pain, nasal congestion. Denies eye redness, ear pain, ringing in the ears, wax buildup, runny nose or sore throat. Respiratory: Positive cough. Denies difficulty breathing or shortness of breath.  Cardiovascular: Denies chest pain, chest tightness, palpitations or swelling in the hands or feet.  Skin: Pt reports insect bite. Denies rashes or ulcerations.   No other specific complaints in a complete review of systems (except as listed in HPI above).  Objective:   BP 126/72   Pulse 68   Temp 97.5 F (36.4 C) (Oral)   Wt 161 lb 12 oz (73.4 kg)   SpO2 98%   BMI 26.92 kg/m   General: Appears her stated age, in NAD. HEENT: Head: normal shape and size, right frontal sinus tenderness noted;  Ears: Tm's gray and intact, normal light reflex; Nose: mucosa boggy and moist, septum midline; Throat/Mouth: + PND. Teeth present, mucosa pink and moist, no exudate noted, no lesions or ulcerations noted.  Neck:  No adenopathy noted.  Cardiovascular: Normal rate and rhythm.  Pulmonary/Chest: Normal effort and positive vesicular breath sounds. No respiratory  distress. No wheezes, rales or ronchi noted.       Assessment & Plan:   Acute Bacterial Sinusitis  Can use a Neti Pot which can be purchased from your local drug store. Flonase 2 sprays each nostril for 3 days and then as needed. eRx for Amoxil TID for 10 days  Insect Bite of Leg:  No infection noted Continue Polysporin  RTC as needed or if symptoms persist. Webb Silversmith, NP

## 2016-05-18 NOTE — Patient Instructions (Signed)

## 2016-05-19 ENCOUNTER — Ambulatory Visit: Payer: PPO | Admitting: Internal Medicine

## 2016-06-17 ENCOUNTER — Other Ambulatory Visit: Payer: Self-pay | Admitting: Internal Medicine

## 2016-07-05 ENCOUNTER — Ambulatory Visit (INDEPENDENT_AMBULATORY_CARE_PROVIDER_SITE_OTHER): Payer: PPO | Admitting: Internal Medicine

## 2016-07-05 ENCOUNTER — Encounter: Payer: Self-pay | Admitting: Internal Medicine

## 2016-07-05 VITALS — BP 124/78 | HR 65 | Temp 97.8°F | Ht 64.0 in | Wt 157.5 lb

## 2016-07-05 DIAGNOSIS — Z0000 Encounter for general adult medical examination without abnormal findings: Secondary | ICD-10-CM

## 2016-07-05 LAB — COMPREHENSIVE METABOLIC PANEL
ALK PHOS: 70 U/L (ref 39–117)
ALT: 11 U/L (ref 0–35)
AST: 17 U/L (ref 0–37)
Albumin: 4.2 g/dL (ref 3.5–5.2)
BUN: 17 mg/dL (ref 6–23)
CHLORIDE: 104 meq/L (ref 96–112)
CO2: 29 mEq/L (ref 19–32)
Calcium: 9.8 mg/dL (ref 8.4–10.5)
Creatinine, Ser: 1.04 mg/dL (ref 0.40–1.20)
GFR: 54.13 mL/min — AB (ref >60.00)
GLUCOSE: 104 mg/dL — AB (ref 70–99)
POTASSIUM: 3.7 meq/L (ref 3.5–5.1)
Sodium: 141 mEq/L (ref 135–145)
TOTAL PROTEIN: 7.6 g/dL (ref 6.0–8.3)
Total Bilirubin: 0.5 mg/dL (ref 0.2–1.2)

## 2016-07-05 LAB — CBC
HEMATOCRIT: 40.7 % (ref 36.0–46.0)
HEMOGLOBIN: 13.5 g/dL (ref 12.0–15.0)
MCHC: 33.2 g/dL (ref 30.0–36.0)
MCV: 91.3 fl (ref 78.0–100.0)
Platelets: 227 10*3/uL (ref 150.0–400.0)
RBC: 4.46 Mil/uL (ref 3.87–5.11)
RDW: 13.5 % (ref 11.5–15.5)
WBC: 7.3 10*3/uL (ref 4.0–10.5)

## 2016-07-05 LAB — LIPID PANEL
CHOLESTEROL: 265 mg/dL — AB (ref 0–200)
HDL: 47.1 mg/dL (ref >39.00)
LDL Cholesterol: 188 mg/dL — ABNORMAL HIGH (ref 0–99)
NonHDL: 217.83
Total CHOL/HDL Ratio: 6
Triglycerides: 147 mg/dL (ref 0.0–149.0)
VLDL: 29.4 mg/dL (ref 0.0–40.0)

## 2016-07-05 LAB — VITAMIN D 25 HYDROXY (VIT D DEFICIENCY, FRACTURES): VITD: 47.79 ng/mL (ref 30.00–100.00)

## 2016-07-05 NOTE — Patient Instructions (Signed)
Health Maintenance for Postmenopausal Women Menopause is a normal process in which your reproductive ability comes to an end. This process happens gradually over a span of months to years, usually between the ages of 22 and 9. Menopause is complete when you have missed 12 consecutive menstrual periods. It is important to talk with your health care provider about some of the most common conditions that affect postmenopausal women, such as heart disease, cancer, and bone loss (osteoporosis). Adopting a healthy lifestyle and getting preventive care can help to promote your health and wellness. Those actions can also lower your chances of developing some of these common conditions. What should I know about menopause? During menopause, you may experience a number of symptoms, such as:  Moderate-to-severe hot flashes.  Night sweats.  Decrease in sex drive.  Mood swings.  Headaches.  Tiredness.  Irritability.  Memory problems.  Insomnia.  Choosing to treat or not to treat menopausal changes is an individual decision that you make with your health care provider. What should I know about hormone replacement therapy and supplements? Hormone therapy products are effective for treating symptoms that are associated with menopause, such as hot flashes and night sweats. Hormone replacement carries certain risks, especially as you become older. If you are thinking about using estrogen or estrogen with progestin treatments, discuss the benefits and risks with your health care provider. What should I know about heart disease and stroke? Heart disease, heart attack, and stroke become more likely as you age. This may be due, in part, to the hormonal changes that your body experiences during menopause. These can affect how your body processes dietary fats, triglycerides, and cholesterol. Heart attack and stroke are both medical emergencies. There are many things that you can do to help prevent heart disease  and stroke:  Have your blood pressure checked at least every 1-2 years. High blood pressure causes heart disease and increases the risk of stroke.  If you are 53-22 years old, ask your health care provider if you should take aspirin to prevent a heart attack or a stroke.  Do not use any tobacco products, including cigarettes, chewing tobacco, or electronic cigarettes. If you need help quitting, ask your health care provider.  It is important to eat a healthy diet and maintain a healthy weight. ? Be sure to include plenty of vegetables, fruits, low-fat dairy products, and lean protein. ? Avoid eating foods that are high in solid fats, added sugars, or salt (sodium).  Get regular exercise. This is one of the most important things that you can do for your health. ? Try to exercise for at least 150 minutes each week. The type of exercise that you do should increase your heart rate and make you sweat. This is known as moderate-intensity exercise. ? Try to do strengthening exercises at least twice each week. Do these in addition to the moderate-intensity exercise.  Know your numbers.Ask your health care provider to check your cholesterol and your blood glucose. Continue to have your blood tested as directed by your health care provider.  What should I know about cancer screening? There are several types of cancer. Take the following steps to reduce your risk and to catch any cancer development as early as possible. Breast Cancer  Practice breast self-awareness. ? This means understanding how your breasts normally appear and feel. ? It also means doing regular breast self-exams. Let your health care provider know about any changes, no matter how small.  If you are 40  or older, have a clinician do a breast exam (clinical breast exam or CBE) every year. Depending on your age, family history, and medical history, it may be recommended that you also have a yearly breast X-ray (mammogram).  If you  have a family history of breast cancer, talk with your health care provider about genetic screening.  If you are at high risk for breast cancer, talk with your health care provider about having an MRI and a mammogram every year.  Breast cancer (BRCA) gene test is recommended for women who have family members with BRCA-related cancers. Results of the assessment will determine the need for genetic counseling and BRCA1 and for BRCA2 testing. BRCA-related cancers include these types: ? Breast. This occurs in males or females. ? Ovarian. ? Tubal. This may also be called fallopian tube cancer. ? Cancer of the abdominal or pelvic lining (peritoneal cancer). ? Prostate. ? Pancreatic.  Cervical, Uterine, and Ovarian Cancer Your health care provider may recommend that you be screened regularly for cancer of the pelvic organs. These include your ovaries, uterus, and vagina. This screening involves a pelvic exam, which includes checking for microscopic changes to the surface of your cervix (Pap test).  For women ages 21-65, health care providers may recommend a pelvic exam and a Pap test every three years. For women ages 42-65, they may recommend the Pap test and pelvic exam, combined with testing for human papilloma virus (HPV), every five years. Some types of HPV increase your risk of cervical cancer. Testing for HPV may also be done on women of any age who have unclear Pap test results.  Other health care providers may not recommend any screening for nonpregnant women who are considered low risk for pelvic cancer and have no symptoms. Ask your health care provider if a screening pelvic exam is right for you.  If you have had past treatment for cervical cancer or a condition that could lead to cancer, you need Pap tests and screening for cancer for at least 20 years after your treatment. If Pap tests have been discontinued for you, your risk factors (such as having a new sexual partner) need to be  reassessed to determine if you should start having screenings again. Some women have medical problems that increase the chance of getting cervical cancer. In these cases, your health care provider may recommend that you have screening and Pap tests more often.  If you have a family history of uterine cancer or ovarian cancer, talk with your health care provider about genetic screening.  If you have vaginal bleeding after reaching menopause, tell your health care provider.  There are currently no reliable tests available to screen for ovarian cancer.  Lung Cancer Lung cancer screening is recommended for adults 32-34 years old who are at high risk for lung cancer because of a history of smoking. A yearly low-dose CT scan of the lungs is recommended if you:  Currently smoke.  Have a history of at least 30 pack-years of smoking and you currently smoke or have quit within the past 15 years. A pack-year is smoking an average of one pack of cigarettes per day for one year.  Yearly screening should:  Continue until it has been 15 years since you quit.  Stop if you develop a health problem that would prevent you from having lung cancer treatment.  Colorectal Cancer  This type of cancer can be detected and can often be prevented.  Routine colorectal cancer screening usually begins at  age 52 and continues through age 62.  If you have risk factors for colon cancer, your health care provider may recommend that you be screened at an earlier age.  If you have a family history of colorectal cancer, talk with your health care provider about genetic screening.  Your health care provider may also recommend using home test kits to check for hidden blood in your stool.  A small camera at the end of a tube can be used to examine your colon directly (sigmoidoscopy or colonoscopy). This is done to check for the earliest forms of colorectal cancer.  Direct examination of the colon should be repeated every  5-10 years until age 4. However, if early forms of precancerous polyps or small growths are found or if you have a family history or genetic risk for colorectal cancer, you may need to be screened more often.  Skin Cancer  Check your skin from head to toe regularly.  Monitor any moles. Be sure to tell your health care provider: ? About any new moles or changes in moles, especially if there is a change in a mole's shape or color. ? If you have a mole that is larger than the size of a pencil eraser.  If any of your family members has a history of skin cancer, especially at a young age, talk with your health care provider about genetic screening.  Always use sunscreen. Apply sunscreen liberally and repeatedly throughout the day.  Whenever you are outside, protect yourself by wearing long sleeves, pants, a wide-brimmed hat, and sunglasses.  What should I know about osteoporosis? Osteoporosis is a condition in which bone destruction happens more quickly than new bone creation. After menopause, you may be at an increased risk for osteoporosis. To help prevent osteoporosis or the bone fractures that can happen because of osteoporosis, the following is recommended:  If you are 60-63 years old, get at least 1,000 mg of calcium and at least 600 mg of vitamin D per day.  If you are older than age 46 but younger than age 17, get at least 1,200 mg of calcium and at least 600 mg of vitamin D per day.  If you are older than age 38, get at least 1,200 mg of calcium and at least 800 mg of vitamin D per day.  Smoking and excessive alcohol intake increase the risk of osteoporosis. Eat foods that are rich in calcium and vitamin D, and do weight-bearing exercises several times each week as directed by your health care provider. What should I know about how menopause affects my mental health? Depression may occur at any age, but it is more common as you become older. Common symptoms of depression  include:  Low or sad mood.  Changes in sleep patterns.  Changes in appetite or eating patterns.  Feeling an overall lack of motivation or enjoyment of activities that you previously enjoyed.  Frequent crying spells.  Talk with your health care provider if you think that you are experiencing depression. What should I know about immunizations? It is important that you get and maintain your immunizations. These include:  Tetanus, diphtheria, and pertussis (Tdap) booster vaccine.  Influenza every year before the flu season begins.  Pneumonia vaccine.  Shingles vaccine.  Your health care provider may also recommend other immunizations. This information is not intended to replace advice given to you by your health care provider. Make sure you discuss any questions you have with your health care provider. Document Released: 03/11/2005  Document Revised: 08/07/2015 Document Reviewed: 10/21/2014 Elsevier Interactive Patient Education  [employer].

## 2016-07-05 NOTE — Progress Notes (Signed)
HPI:  Pt presents to the clinic today for her Medicare Wellness Exam. She does want to let me know that she is not taking the cholesterol medication daily because she does not feel well when she takes it.  Past Medical History:  Diagnosis Date  . Allergy   . GERD (gastroesophageal reflux disease)   . Hyperlipidemia   . Hypertension    diuretic for edema at first  . Hypothyroidism   . Osteoarthritis, multiple sites   . Osteoporosis     Current Outpatient Prescriptions  Medication Sig Dispense Refill  . amoxicillin (AMOXIL) 500 MG capsule Take 1 capsule (500 mg total) by mouth 3 (three) times daily. 21 capsule 0  . aspirin 81 MG EC tablet Take 81 mg by mouth daily.      . cetirizine (ZYRTEC) 10 MG tablet Take 10 mg by mouth daily.      . Cholecalciferol (VITAMIN D3) 2000 UNITS TABS Take by mouth.      . Cyanocobalamin (VITAMIN B 12 PO) Take by mouth daily.    Marland Kitchen DEXILANT 60 MG capsule Take 60 mg by mouth daily.     . furosemide (LASIX) 20 MG tablet TAKE 1/2 TABLET BY MOUTH ONCE DAILY 30 tablet 0  . levothyroxine (SYNTHROID, LEVOTHROID) 75 MCG tablet TAKE 1 TABLET BY MOUTH ONCE DAILY 30 tablet 5  . OVER THE COUNTER MEDICATION Take 1 capsule by mouth daily. Probiotic    . rosuvastatin (CRESTOR) 20 MG tablet Take 1 tablet (20 mg total) by mouth daily. 30 tablet 2   No current facility-administered medications for this visit.     No Known Allergies  Family History  Problem Relation Age of Onset  . Heart disease Mother   . Stroke Father   . Hypertension Father   . Heart disease Father   . Diabetes Father     Social History   Social History  . Marital status: Divorced    Spouse name: N/A  . Number of children: 3  . Years of education: N/A   Occupational History  . Office supervisor     [employer]   Social History Main Topics  . Smoking status: Never Smoker  . Smokeless tobacco: Never Used  . Alcohol use No  . Drug use: No  . Sexual activity: No   Other Topics  Concern  . Not on file   Social History Narrative   3 children--- 1 died   Lives alone      No living will   Requests son and daughter as health care POAs   Would accept resuscitation attempts but no prolonged artificial ventilation   Not sure about tube feeds    Hospitiliaztions: None  Health Maintenance:    Flu: 11/2015  Tetanus: 01/2006  Pneumovax: 01/2006  Prevnar: 05/2014  Zostavax:07/2013  Mammogram: 01/2016  Pap Smear: no longer screening  Bone Density: 01/2016  Colon Screening: 01/2015  Eye Doctor: 03/2016  Dental Exam: as needed   Providers:   PCP: Webb Silversmith, NP-C  Cardiologist: Dr. Yvone Neu  Gastroenterologist: Dr. Earlean Shawl    I have personally reviewed and have noted:  1. The patient's medical and social history 2. Their use of alcohol, tobacco or illicit drugs 3. Their current medications and supplements 4. The patient's functional ability including ADL's, fall risks, home safety risks and hearing or visual impairment. 5. Diet and physical activities 6. Evidence for depression or mood disorder  Subjective:   Review of Systems:   Constitutional: Pt reports fatigue.  Denies fever, malaise, headache or abrupt weight changes.  HEENT: Denies eye pain, eye redness, ear pain, ringing in the ears, wax buildup, runny nose, nasal congestion, bloody nose, or sore throat. Respiratory: Denies difficulty breathing, shortness of breath, cough or sputum production.   Cardiovascular: Pt reports intermittent swelling in feet. Denies chest pain, chest tightness, palpitations or swelling in the hands.  Gastrointestinal: Denies abdominal pain, bloating, constipation, diarrhea or blood in the stool.  GU: Denies urgency, frequency, pain with urination, burning sensation, blood in urine, odor or discharge. Musculoskeletal: Denies decrease in range of motion, difficulty with gait, muscle pain or joint pain and swelling.  Skin: Denies redness, rashes, lesions or ulcercations.   Neurological: Denies dizziness, difficulty with memory, difficulty with speech or problems with balance and coordination.  Psych: Denies anxiety, depression, SI/HI.  No other specific complaints in a complete review of systems (except as listed in HPI above).  Objective:  PE:   BP 124/78   Pulse 65   Temp 97.8 F (36.6 C) (Oral)   Ht 5\' 4"  (1.626 m)   Wt 157 lb 8 oz (71.4 kg)   SpO2 97%   BMI 27.03 kg/m   Wt Readings from Last 3 Encounters:  05/18/16 161 lb 12 oz (73.4 kg)  05/11/16 161 lb 12 oz (73.4 kg)  02/24/16 160 lb (72.6 kg)    General: Appears her stated age, in NAD. Skin: Warm, dry and intact. She has had a lot of sun exposure. Skin is thick and leathery. HEENT: Head: normal shape and size; Ears: Tm's gray and intact, normal light reflex; Throat/Mouth: Teeth present, mucosa pink and moist, no exudate, lesions or ulcerations noted.  Neck: Neck supple, trachea midline. No masses, lumps or thyromegaly present.  Cardiovascular: Normal rate and rhythm. S1,S2 noted.  No murmur, rubs or gallops noted. No JVD or BLE edema. No carotid bruits noted. Pulmonary/Chest: Normal effort and positive vesicular breath sounds. No respiratory distress. No wheezes, rales or ronchi noted.  Abdomen: Soft and nontender. Normal bowel sounds. No distention or masses noted.  Musculoskeletal: Strength 5/5 BUE/BLE. No difficulty with gait. Neurological: Alert and oriented.  Psychiatric: Mood and affect normal. Behavior is normal. Judgment and thought content normal.    BMET    Component Value Date/Time   NA 140 12/18/2015 1141   K 4.0 12/18/2015 1141   CL 103 12/18/2015 1141   CO2 29 12/18/2015 1141   GLUCOSE 73 12/18/2015 1141   BUN 18 12/18/2015 1141   CREATININE 0.89 12/18/2015 1141   CALCIUM 9.5 12/18/2015 1141   GFRNONAA 68.59 12/04/2009 1131   GFRAA 91 03/12/2008 0928    Lipid Panel     Component Value Date/Time   CHOL 243 (H) 11/11/2015 1413   TRIG 107.0 11/11/2015 1413    TRIG 103 12/06/2005 1055   HDL 55.10 11/11/2015 1413   CHOLHDL 4 11/11/2015 1413   VLDL 21.4 11/11/2015 1413   LDLCALC 167 (H) 11/11/2015 1413    CBC    Component Value Date/Time   WBC 5.7 12/18/2015 1141   RBC 4.33 12/18/2015 1141   HGB 13.4 12/18/2015 1141   HCT 38.8 12/18/2015 1141   PLT 271.0 12/18/2015 1141   MCV 89.8 12/18/2015 1141   MCHC 34.5 12/18/2015 1141   RDW 14.2 12/18/2015 1141   LYMPHSABS 1.6 12/18/2015 1141   MONOABS 0.7 12/18/2015 1141   EOSABS 0.2 12/18/2015 1141   BASOSABS 0.0 12/18/2015 1141    Hgb A1C Lab Results  Component Value Date  HGBA1C 6.2 11/11/2015      Assessment and Plan:   Medicare Annual Wellness Visit:  Diet: She does eat meat. She consumes fruits and veggies daily. She tries to avoid fried foods. She drinks mostly water, coffee. Physical activity: Yardwork. Depression/mood screen: Negative Hearing: Intact to whispered voice Visual acuity: Grossly normal, performs annual eye exam  ADLs: Capable Fall risk: None Home safety: Good Cognitive evaluation: Intact to orientation, naming, recall and repetition EOL planning: Adv directives, full code/ I agree  Preventative Medicine:  She declines tetanus for financial reasons. All other immunizations UTD. Mammogram and colon screening UTD. She is no longer doing pap smears. Encouraged her to consume a balanced diet and exercise regimen. Advised her to see an eye doctor and dentist annually. Will check CBC, CMET, Lipid and Vit D today.   Next appointment: 6 months, follow up chronic conditions   BAITY, REGINA, NP

## 2016-07-08 ENCOUNTER — Encounter: Payer: Self-pay | Admitting: Family Medicine

## 2016-07-08 ENCOUNTER — Ambulatory Visit (INDEPENDENT_AMBULATORY_CARE_PROVIDER_SITE_OTHER): Payer: PPO | Admitting: Family Medicine

## 2016-07-08 VITALS — BP 122/74 | HR 73 | Temp 97.8°F | Ht 64.0 in | Wt 159.2 lb

## 2016-07-08 DIAGNOSIS — W57XXXA Bitten or stung by nonvenomous insect and other nonvenomous arthropods, initial encounter: Secondary | ICD-10-CM | POA: Diagnosis not present

## 2016-07-08 DIAGNOSIS — S80869A Insect bite (nonvenomous), unspecified lower leg, initial encounter: Secondary | ICD-10-CM

## 2016-07-08 NOTE — Patient Instructions (Signed)
Tick bites look ok.  Watch for any fevers/chills, joint pains, new rashes, abdominal pain, nausea, headache, fatigue.  If any of this develops around tick bite, we should treat you with antibiotic.  If no symptoms 7-10 days after tick bite, you should be ok.   Tick Bite Information, Adult Ticks are insects that draw blood for food. Most ticks live in shrubs and grassy areas. They climb onto people and animals that brush against the leaves and grasses that they rest on. Then they bite, attaching themselves to the skin. Most ticks are harmless, but some ticks carry germs that can spread to a person through a bite and cause a disease. To reduce your risk of getting a disease from a tick bite, it is important to take steps to prevent tick bites. It is also important to check for ticks after being outdoors. If you find that a tick has attached to you, watch for symptoms of disease. How can I prevent tick bites? Take these steps to help prevent tick bites when you are outdoors in an area where ticks are found:  Use insect repellent that has DEET (20% or higher), picaridin, or IR3535 in it. Use it on: ? Skin that is showing. ? The top of your boots. ? Your pant legs. ? Your sleeve cuffs.  For repellent products that contain permethrin, follow product instructions. Use these products on: ? Clothing. ? Gear. ? Boots. ? Tents.  Wear protective clothing. Long sleeves and long pants offer the best protection from ticks.  Wear light-colored clothing so you can see ticks more easily.  Tuck your pant legs into your socks.  If you go walking on a trail, stay in the middle of the trail so your skin, hair, and clothing do not touch the bushes.  Avoid walking through areas with long grass.  Check for ticks on your clothing, hair, and skin often while you are outside, and check again before you go inside. Make sure to check the places that ticks attach themselves most often. These places include the  scalp, neck, armpits, waist, groin, and joint areas. Ticks that carry a disease called Lyme disease have to be attached to the skin for 24-48 hours. Checking for ticks every day will lessen your risk of this and other diseases.  When you come indoors, wash your clothes and take a shower or a bath right away. Dry your clothes in a dryer on high heat for at least 60 minutes. This will kill any ticks in your clothes.  What is the proper way to remove a tick? If you find a tick on your body, remove it as soon as possible. Removing a tick sooner rather than later can prevent germs from passing from the tick to your body. To remove a tick that is crawling on your skin but has not bitten:  Go outdoors and brush the tick off.  Remove the tick with tape or a lint roller.  To remove a tick that is attached to your skin:  Wash your hands.  If you have latex gloves, put them on.  Use tweezers, curved forceps, or a tick-removal tool to gently grasp the tick as close to your skin and the tick's head as possible.  Gently pull with steady, upward pressure until the tick lets go. When removing the tick: ? Take care to keep the tick's head attached to its body. ? Do not twist or jerk the tick. This can make the tick's head or mouth  break off. ? Do not squeeze or crush the tick's body. This could force disease-carrying fluids from the tick into your body.  Do not try to remove a tick with heat, alcohol, petroleum jelly, or fingernail polish. Using these methods can cause the tick to salivate and regurgitate into your bloodstream, increasing your risk of getting a disease. What should I do after removing a tick?  Clean the bite area with soap and water, rubbing alcohol, or an iodine scrub.  If an antiseptic cream or ointment is available, apply a small amount to the bite site.  Wash and disinfect any instruments that you used to remove the tick. How should I dispose of a tick? To dispose of a live tick,  use one of these methods:  Place it in rubbing alcohol.  Place it in a sealed bag or container.  Wrap it tightly in tape.  Flush it down the toilet.  Contact a health care provider if:  You have symptoms of a disease after a tick bite. Symptoms of a tick-borne disease can occur from moments after the tick bites to up to 30 days after a tick is removed. Symptoms include: ? Muscle, joint, or bone pain. ? Difficulty walking or moving your legs. ? Numbness in the legs. ? Paralysis. ? Red rash around the tick bite area that is shaped like a target or a "bull's-eye." ? Redness and swelling in the area of the tick bite. ? Fever. ? Repeated vomiting. ? Diarrhea. ? Weight loss. ? Tender, swollen lymph glands. ? Shortness of breath. ? Cough. ? Pain in the abdomen. ? Headache. ? Abnormal tiredness. ? A change in your level of consciousness. ? Confusion. Get help right away if:  You are not able to remove a tick.  A part of a tick breaks off and gets stuck in your skin.  Your symptoms get worse. Summary  Ticks may carry germs that can spread to a person through a bite and cause disease.  Wear protective clothing and use insect repellent to prevent tick bites. Follow product instructions.  If you find a tick on your body, remove it as soon as possible. If the tick is attached, do not try to remove with heat, alcohol, petroleum jelly, or fingernail polish.  Remove the attached tick using tweezers, curved forceps, or a tick-removal tool. Gently pull with steady, upward pressure until the tick lets go. Do not twist or jerk the tick. Do not squeeze or crush the tick's body.  If you have symptoms after being bitten by a tick, contact a health care provider. This information is not intended to replace advice given to you by your health care provider. Make sure you discuss any questions you have with your health care provider. Document Released: 01/15/2000 Document Revised: 10/30/2015  Document Reviewed: 10/30/2015 Elsevier Interactive Patient Education  [employer].

## 2016-07-08 NOTE — Assessment & Plan Note (Signed)
Bilateral popliteal region - no concern for current tick borne illness but symptoms reviewed that would necessitate abx treatment. Pt will monitor for these symptoms. Discussed insect repellant use when working in the yard.

## 2016-07-08 NOTE — Progress Notes (Signed)
   BP 122/74   Pulse 73   Temp 97.8 F (36.6 C)   Ht 5\' 4"  (1.626 m)   Wt 159 lb 4 oz (72.2 kg)   SpO2 97%   BMI 27.34 kg/m    CC: check leg swelling, ?bug bite Subjective:    Patient ID: Amy Lin, female    DOB: 03-19-1933, 83 y.o.   MRN: [id number]  HPI: Amy Lin is a 83 y.o. female presenting on 07/08/2016 for Acute Visit (bug bite, red swollen )   She pulled what she thinks was tick off left popliteal region Sunday evening. Unsure how long it was present. Got full tick off.  Denies fevers/chills, joint pains, new rashes, abd pain, nausea, headache, fatigue.  Also has spot on R popliteal region She regularly works in her yard.   Relevant past medical, surgical, family and social history reviewed and updated as indicated. Interim medical history since our last visit reviewed. Allergies and medications reviewed and updated. Outpatient Medications Prior to Visit  Medication Sig Dispense Refill  . aspirin 81 MG EC tablet Take 81 mg by mouth daily.      . cetirizine (ZYRTEC) 10 MG tablet Take 10 mg by mouth daily.      . Cholecalciferol (VITAMIN D3) 2000 UNITS TABS Take by mouth.      . Cyanocobalamin (VITAMIN B 12 PO) Take by mouth daily.    Marland Kitchen DEXILANT 60 MG capsule Take 60 mg by mouth daily.     . furosemide (LASIX) 20 MG tablet TAKE 1/2 TABLET BY MOUTH ONCE DAILY 30 tablet 0  . levothyroxine (SYNTHROID, LEVOTHROID) 75 MCG tablet TAKE 1 TABLET BY MOUTH ONCE DAILY 30 tablet 5  . OVER THE COUNTER MEDICATION Take 1 capsule by mouth daily. Probiotic    . rosuvastatin (CRESTOR) 20 MG tablet Take 1 tablet (20 mg total) by mouth daily. 30 tablet 2   No facility-administered medications prior to visit.      Per HPI unless specifically indicated in ROS section below Review of Systems     Objective:    BP 122/74   Pulse 73   Temp 97.8 F (36.6 C)   Ht 5\' 4"  (1.626 m)   Wt 159 lb 4 oz (72.2 kg)   SpO2 97%   BMI 27.34 kg/m   Wt Readings from Last 3 Encounters:    07/08/16 159 lb 4 oz (72.2 kg)  07/05/16 157 lb 8 oz (71.4 kg)  05/18/16 161 lb 12 oz (73.4 kg)    Physical Exam  Constitutional: She appears well-developed and well-nourished. No distress.  Musculoskeletal: She exhibits no edema.  1+ DP bilaterally Tick bites bilateral popliteal regions without residual tick part, with mild edema around bite without significant erythema or other rash  Skin: Skin is warm and dry. No rash noted.  Nursing note and vitals reviewed.     Assessment & Plan:   Problem List Items Addressed This Visit    Arthropod bite of lower leg - Primary    Bilateral popliteal region - no concern for current tick borne illness but symptoms reviewed that would necessitate abx treatment. Pt will monitor for these symptoms. Discussed insect repellant use when working in the yard.           Follow up plan: Return if symptoms worsen or fail to improve.  Ria Bush, MD

## 2016-07-15 ENCOUNTER — Ambulatory Visit (INDEPENDENT_AMBULATORY_CARE_PROVIDER_SITE_OTHER): Payer: PPO | Admitting: Family Medicine

## 2016-07-15 ENCOUNTER — Encounter: Payer: Self-pay | Admitting: Family Medicine

## 2016-07-15 VITALS — BP 151/83 | HR 76 | Temp 98.8°F | Ht 64.0 in | Wt 158.5 lb

## 2016-07-15 DIAGNOSIS — S70361A Insect bite (nonvenomous), right thigh, initial encounter: Secondary | ICD-10-CM

## 2016-07-15 DIAGNOSIS — W57XXXD Bitten or stung by nonvenomous insect and other nonvenomous arthropods, subsequent encounter: Secondary | ICD-10-CM | POA: Diagnosis not present

## 2016-07-15 DIAGNOSIS — R509 Fever, unspecified: Secondary | ICD-10-CM | POA: Insufficient documentation

## 2016-07-15 LAB — BASIC METABOLIC PANEL
BUN: 15 mg/dL (ref 7–25)
CALCIUM: 9.4 mg/dL (ref 8.6–10.4)
CO2: 24 mmol/L (ref 20–31)
CREATININE: 1.11 mg/dL — AB (ref 0.60–0.88)
Chloride: 102 mmol/L (ref 98–110)
Glucose, Bld: 108 mg/dL — ABNORMAL HIGH (ref 65–99)
Potassium: 4 mmol/L (ref 3.5–5.3)
SODIUM: 139 mmol/L (ref 135–146)

## 2016-07-15 LAB — CBC WITH DIFFERENTIAL/PLATELET
BASOS ABS: 53 {cells}/uL (ref 0–200)
Basophils Relative: 1 %
EOS PCT: 1 %
Eosinophils Absolute: 53 cells/uL (ref 15–500)
HCT: 41.7 % (ref 35.0–45.0)
Hemoglobin: 14 g/dL (ref 11.7–15.5)
LYMPHS ABS: 1007 {cells}/uL (ref 850–3900)
Lymphocytes Relative: 19 %
MCH: 30.4 pg (ref 27.0–33.0)
MCHC: 33.6 g/dL (ref 32.0–36.0)
MCV: 90.7 fL (ref 80.0–100.0)
MONOS PCT: 16 %
MPV: 10 fL (ref 7.5–12.5)
Monocytes Absolute: 848 cells/uL (ref 200–950)
NEUTROS ABS: 3339 {cells}/uL (ref 1500–7800)
NEUTROS PCT: 63 %
PLATELETS: 176 10*3/uL (ref 140–400)
RBC: 4.6 MIL/uL (ref 3.80–5.10)
RDW: 14.2 % (ref 11.0–15.0)
WBC: 5.3 10*3/uL (ref 3.8–10.8)

## 2016-07-15 MED ORDER — DOXYCYCLINE HYCLATE 100 MG PO TABS: 100.0000 mg | ORAL_TABLET | Freq: Two times a day (BID) | ORAL | 0 refills | Status: DC

## 2016-07-15 NOTE — Assessment & Plan Note (Addendum)
Most concerning for tick borne illness. Nontoxic appearing and no neuro changes. No indication for hospitalization.  Eval with BMET and cbc, tick borne illness labs.  Start emperic treatment with doxy for tick borne illness and possible mild cellulitis.

## 2016-07-15 NOTE — Progress Notes (Signed)
Subjective:    Patient ID: Amy Lin, female    DOB: [birth year], 83 y.o.   MRN: [id number]  HPI   83 year female presents for new onset tick bite and body aches, fever, chills and fatigue.  She was seen on 6/8  By Dr. Darnell Level for tick bite  occurring on 6/3.  12 days ago... Noted on right posterior thigh.   In last 4 days she has been feeling ill,  weak, nauseous, presyncopal. Noted blood pressure to be low.. Ate chips and drank water.   3 days ago started stomach pains and soreness in bialteral arms and neck sore. Headache mild off and on.  No confusion, no new neuro changes. Has been having chills. Last night had sweats, fever yesterday up to 101 F.   On 6/14  noted redness around tick bite. No pain at site, no discharge.  Blood pressure (!) 151/83, pulse 76, temperature 98.8 F (37.1 C), temperature source Oral, height 5\' 4"  (1.626 m), weight 158 lb 8 oz (71.9 kg).     Review of Systems  Constitutional: Positive for fatigue and fever.  HENT: Negative for ear pain.   Eyes: Negative for pain.  Respiratory: Negative for chest tightness and shortness of breath.   Cardiovascular: Negative for chest pain, palpitations and leg swelling.  Gastrointestinal: Negative for abdominal pain.  Genitourinary: Negative for dysuria.  Musculoskeletal: Positive for arthralgias.       Objective:   Physical Exam  Constitutional: She is oriented to person, place, and time. Vital signs are normal. She appears well-developed and well-nourished. She is cooperative.  Non-toxic appearance. She does not appear ill. No distress.  HENT:  Head: Normocephalic.  Right Ear: Hearing, tympanic membrane, external ear and ear canal normal. Tympanic membrane is not erythematous, not retracted and not bulging.  Left Ear: Hearing, tympanic membrane, external ear and ear canal normal. Tympanic membrane is not erythematous, not retracted and not bulging.  Nose: No mucosal edema or rhinorrhea. Right sinus exhibits  no maxillary sinus tenderness and no frontal sinus tenderness. Left sinus exhibits no maxillary sinus tenderness and no frontal sinus tenderness.  Mouth/Throat: Uvula is midline, oropharynx is clear and moist and mucous membranes are normal.  Eyes: Conjunctivae, EOM and lids are normal. Pupils are equal, round, and reactive to light. Lids are everted and swept, no foreign bodies found.  Neck: Trachea normal and normal range of motion. Neck supple. Carotid bruit is not present. No thyroid mass and no thyromegaly present.  Cardiovascular: Normal rate, regular rhythm, S1 normal, S2 normal, normal heart sounds, intact distal pulses and normal pulses.  Exam reveals no gallop and no friction rub.   No murmur heard. Pulmonary/Chest: Effort normal and breath sounds normal. No tachypnea. No respiratory distress. She has no decreased breath sounds. She has no wheezes. She has no rhonchi. She has no rales.  Abdominal: Soft. Normal appearance and bowel sounds are normal. There is no tenderness.  Neurological: She is alert and oriented to person, place, and time. She has normal strength and normal reflexes. No cranial nerve deficit or sensory deficit. She exhibits normal muscle tone. She displays a negative Romberg sign. Coordination and gait normal. GCS eye subscore is 4. GCS verbal subscore is 5. GCS motor subscore is 6.  Nml cerebellar exam   No papilledema  Skin: Skin is warm, dry and intact. No rash noted.  1 cm erythema and warmth around tick bite  Psychiatric: She has a normal mood and affect. Her  speech is normal and behavior is normal. Judgment and thought content normal. Her mood appears not anxious. Cognition and memory are normal. Cognition and memory are not impaired. She does not exhibit a depressed mood. She exhibits normal recent memory and normal remote memory.          Assessment & Plan:

## 2016-07-15 NOTE — Addendum Note (Signed)
Addended by: Ellamae Sia on: 07/15/2016 05:00 PM   Modules accepted: Orders

## 2016-07-15 NOTE — Patient Instructions (Signed)
Please stop at the lab to set up to have labs drawn.  Complete antibiotics x 10 days. Push lots of fluids. Rest.  Tylenol for body ache and fever.  IF not improving, cannot keep down antibiotics or fever on antibiotics... Call . If severe symptoms go to ER.

## 2016-07-18 LAB — EHRLICHIA ANTIBODY PANEL
E chaffeensis (HGE) Ab, IgG: <1:64 {titer}
E chaffeensis (HGE) Ab, IgM: <1:20 {titer}

## 2016-07-18 LAB — ROCKY MTN SPOTTED FVR ABS PNL(IGG+IGM)
RMSF IGM: NOT DETECTED
RMSF IgG: NOT DETECTED

## 2016-07-18 LAB — LYME AB/WESTERN BLOT REFLEX

## 2016-08-02 ENCOUNTER — Encounter: Payer: Self-pay | Admitting: Internal Medicine

## 2016-08-02 ENCOUNTER — Telehealth: Payer: Self-pay | Admitting: Internal Medicine

## 2016-08-02 ENCOUNTER — Ambulatory Visit (INDEPENDENT_AMBULATORY_CARE_PROVIDER_SITE_OTHER): Payer: PPO | Admitting: Internal Medicine

## 2016-08-02 VITALS — BP 126/82 | HR 64 | Temp 97.8°F | Wt 159.0 lb

## 2016-08-02 DIAGNOSIS — S90822A Blister (nonthermal), left foot, initial encounter: Secondary | ICD-10-CM | POA: Diagnosis not present

## 2016-08-02 DIAGNOSIS — W57XXXD Bitten or stung by nonvenomous insect and other nonvenomous arthropods, subsequent encounter: Secondary | ICD-10-CM | POA: Diagnosis not present

## 2016-08-02 DIAGNOSIS — I10 Essential (primary) hypertension: Secondary | ICD-10-CM | POA: Diagnosis not present

## 2016-08-02 NOTE — Patient Instructions (Signed)
Call or return to clinic prn if these symptoms worsen or fail to improve as anticipated.

## 2016-08-02 NOTE — Progress Notes (Signed)
Subjective:    Patient ID: Amy Lin, female    DOB: [birth year], 83 y.o.   MRN: [id number]  HPI  82 year old patient who was treated for an acute febrile illness approximately 2 weeks ago in the setting of a recent tick bite exposure. The patient was evaluated and screened for tick borne illness that was negative.  Generally she feels well except for the onset of painful feet associated with blistering 3 days ago she noted the onset of a painful blister involving the feet area Denies any blistering of the oral cavity or hands No fever, and in general feels well except for the local discomfort.  Prior to the onset of the blisters, she was doing yard work and using shoes that she rarely uses.  Past Medical History:  Diagnosis Date  . Allergy   . GERD (gastroesophageal reflux disease)   . Hyperlipidemia   . Hypertension    diuretic for edema at first  . Hypothyroidism   . Osteoarthritis, multiple sites   . Osteoporosis      Social History   Social History  . Marital status: Divorced    Spouse name: N/A  . Number of children: 3  . Years of education: N/A   Occupational History  . Office supervisor     [employer]   Social History Main Topics  . Smoking status: Never Smoker  . Smokeless tobacco: Never Used  . Alcohol use No  . Drug use: No  . Sexual activity: No   Other Topics Concern  . Not on file   Social History Narrative   3 children--- 1 died   Lives alone      No living will   Requests son and daughter as health care POAs   Would accept resuscitation attempts but no prolonged artificial ventilation   Not sure about tube feeds    Past Surgical History:  Procedure Laterality Date  . ABDOMINAL HYSTERECTOMY    . BACK SURGERY  1999   Discectomy and Ray cage  . BLADDER REPAIR  ~2010  . CARPAL TUNNEL RELEASE  8/15  . COLONOSCOPY W/ BIOPSIES N/A 2016  . MENISCECTOMY Left 2/15   Dr Amy Lin  . UPPER GI ENDOSCOPY N/A Feb 2017    Family History  Problem  Relation Age of Onset  . Heart disease Mother   . Stroke Father   . Hypertension Father   . Heart disease Father   . Diabetes Father     No Known Allergies  Current Outpatient Prescriptions on File Prior to Visit  Medication Sig Dispense Refill  . aspirin 81 MG EC tablet Take 81 mg by mouth daily.      . cetirizine (ZYRTEC) 10 MG tablet Take 10 mg by mouth daily.      . Cholecalciferol (VITAMIN D3) 2000 UNITS TABS Take by mouth.      . Cyanocobalamin (VITAMIN B 12 PO) Take by mouth daily.    Marland Kitchen DEXILANT 60 MG capsule Take 60 mg by mouth daily.     . furosemide (LASIX) 20 MG tablet TAKE 1/2 TABLET BY MOUTH ONCE DAILY 30 tablet 0  . levothyroxine (SYNTHROID, LEVOTHROID) 75 MCG tablet TAKE 1 TABLET BY MOUTH ONCE DAILY 30 tablet 5  . OVER THE COUNTER MEDICATION Take 1 capsule by mouth daily. Probiotic    . rosuvastatin (CRESTOR) 20 MG tablet Take 1 tablet (20 mg total) by mouth daily. 30 tablet 2   No current facility-administered medications on file prior to  visit.     BP 126/82 (BP Location: Left Arm, Patient Position: Sitting, Cuff Size: Normal)   Pulse 64   Temp 97.8 F (36.6 C) (Oral)   Wt 159 lb (72.1 kg)   SpO2 96%   BMI 27.29 kg/m     Review of Systems  Constitutional: Negative.   HENT: Negative for congestion, dental problem, hearing loss, rhinorrhea, sinus pressure, sore throat and tinnitus.   Eyes: Negative for pain, discharge and visual disturbance.  Respiratory: Negative for cough and shortness of breath.   Cardiovascular: Negative for chest pain, palpitations and leg swelling.  Gastrointestinal: Negative for abdominal distention, abdominal pain, blood in stool, constipation, diarrhea, nausea and vomiting.  Genitourinary: Negative for difficulty urinating, dysuria, flank pain, frequency, hematuria, pelvic pain, urgency, vaginal bleeding, vaginal discharge and vaginal pain.  Musculoskeletal: Positive for myalgias. Negative for arthralgias, gait problem and joint  swelling.  Skin: Positive for rash.  Neurological: Negative for dizziness, syncope, speech difficulty, weakness, numbness and headaches.  Hematological: Negative for adenopathy.  Psychiatric/Behavioral: Negative for agitation, behavioral problems and dysphoric mood. The patient is not nervous/anxious.        Objective:   Physical Exam  Constitutional: She appears well-developed and well-nourished. No distress.    Afebrile No distress  Skin:  Approximate 1.5 centimeter bulla present over the dorsal aspect of both great toes A smaller blister was noted involving the dorsal aspect of the left distal toe No oral lesions or lesions of the hands          Assessment & Plan:   Blisters involving both feet in the setting of a recent acute febrile illness.  Unclear whether this is a post viral exanthem, a limited/localized epidermolysis simplex type reaction, or a simple friction blister issue.  Local skin care discussed.  She report any new or worsening symptoms  Nyoka Cowden

## 2016-08-02 NOTE — Telephone Encounter (Signed)
Patient Name: ADALEAH FORGET DOB: 13-Jan-1934 Initial Comment Caller states her feet are numb and blisters are on the joints of her big toes. Nurse Assessment Nurse: Ronnald Ramp, RN, Miranda Date/Time (Eastern Time): 08/02/2016 9:08:52 AM Confirm and document reason for call. If symptomatic, describe symptoms. ---Caller states she has blisters on her big toes for 3 days. There is also blisters on the other toes on her left foot. She states she does not feel these look like friction blisters. She has also been having numbness on the bottoms of her feet for 1 year but getting worse. She has been seen for that in the past with no specific diagnosis. Does the patient have any new or worsening symptoms? ---Yes Will a triage be completed? ---Yes Related visit to physician within the last 2 weeks? ---No Does the PT have any chronic conditions? (i.e. diabetes, asthma, etc.) ---Yes List chronic conditions. ---Thyroid, Allergies, GERD Is this a behavioral health or substance abuse call? ---No Guidelines Guideline Title Affirmed Question Affirmed Notes Blister - Foot and Hand [1] Severe pain or large blister AND [2] caller wants doctor to drain blister Neurologic Deficit [1] Numbness or tingling in one or both feet AND [2] is a chronic symptom (recurrent or ongoing AND present > 4 weeks) Final Disposition User See PCP When Office is Open (within 3 days) Ronnald Ramp, RN, Miranda Comments No appt available with PCP or at Primary office. Appt scheduled at [facility] with Dr. Burnice Logan for 2:30. Referrals GO TO FACILITY OTHER - SPECIFY Disagree/Comply: Comply Disagree/Comply: Comply

## 2016-08-02 NOTE — Telephone Encounter (Signed)
Pt has appt with Dr Burnice Logan 08/02/16 at 2:30

## 2016-08-09 DIAGNOSIS — M19072 Primary osteoarthritis, left ankle and foot: Secondary | ICD-10-CM | POA: Diagnosis not present

## 2016-08-09 DIAGNOSIS — M19071 Primary osteoarthritis, right ankle and foot: Secondary | ICD-10-CM | POA: Diagnosis not present

## 2016-08-09 DIAGNOSIS — M25571 Pain in right ankle and joints of right foot: Secondary | ICD-10-CM | POA: Diagnosis not present

## 2016-08-09 DIAGNOSIS — M25572 Pain in left ankle and joints of left foot: Secondary | ICD-10-CM | POA: Diagnosis not present

## 2016-08-26 ENCOUNTER — Other Ambulatory Visit: Payer: Self-pay | Admitting: Internal Medicine

## 2016-08-26 MED ORDER — FUROSEMIDE 20 MG PO TABS: 10.0000 mg | ORAL_TABLET | Freq: Every day | ORAL | 0 refills | Status: DC

## 2016-08-26 NOTE — Telephone Encounter (Signed)
Last filled 06/17/16... Please advise

## 2016-09-02 ENCOUNTER — Other Ambulatory Visit: Payer: Self-pay | Admitting: Internal Medicine

## 2016-09-19 ENCOUNTER — Ambulatory Visit (INDEPENDENT_AMBULATORY_CARE_PROVIDER_SITE_OTHER): Payer: PPO | Admitting: Internal Medicine

## 2016-09-19 ENCOUNTER — Encounter: Payer: Self-pay | Admitting: Internal Medicine

## 2016-09-19 VITALS — BP 142/86 | HR 87 | Temp 97.1°F | Resp 16 | Ht 64.0 in | Wt 158.0 lb

## 2016-09-19 DIAGNOSIS — J069 Acute upper respiratory infection, unspecified: Secondary | ICD-10-CM | POA: Diagnosis not present

## 2016-09-19 DIAGNOSIS — B9789 Other viral agents as the cause of diseases classified elsewhere: Secondary | ICD-10-CM | POA: Diagnosis not present

## 2016-09-19 NOTE — Progress Notes (Signed)
HPI  Pt presents to the clinic today with c/o nasal congestion, sore throat and cough. She reports this started 1 week ago. She is blowing clear mucous out of her nose. She denies difficulty swallowing. The cough is productive of yellow mucous. She denies fever, chills or body aches. She has tried Zyrtec, Flonase and Mucinex with some relief. She has a history of seasonal allergies. She has not had sick contacts.  Review of Systems        Past Medical History:  Diagnosis Date  . Allergy   . GERD (gastroesophageal reflux disease)   . Hyperlipidemia   . Hypertension    diuretic for edema at first  . Hypothyroidism   . Osteoarthritis, multiple sites   . Osteoporosis     Family History  Problem Relation Age of Onset  . Heart disease Mother   . Stroke Father   . Hypertension Father   . Heart disease Father   . Diabetes Father     Social History   Social History  . Marital status: Divorced    Spouse name: N/A  . Number of children: 3  . Years of education: N/A   Occupational History  . Office supervisor     [employer]   Social History Main Topics  . Smoking status: Never Smoker  . Smokeless tobacco: Never Used  . Alcohol use No  . Drug use: No  . Sexual activity: No   Other Topics Concern  . Not on file   Social History Narrative   3 children--- 1 died   Lives alone      No living will   Requests son and daughter as health care POAs   Would accept resuscitation attempts but no prolonged artificial ventilation   Not sure about tube feeds    No Known Allergies   Constitutional: Positive fatigue. Denies headache, fever or abrupt weight changes.  HEENT:  Positive nasal congestion, sore throat. Denies eye redness, eye pain, pressure behind the eyes, facial pain, ear pain, ringing in the ears, wax buildup, runny nose or bloody nose. Respiratory: Positive cough. Denies difficulty breathing or shortness of breath.  Cardiovascular: Denies chest pain, chest  tightness, palpitations or swelling in the hands or feet.   No other specific complaints in a complete review of systems (except as listed in HPI above).  Objective:   BP (!) 142/86 (BP Location: Left Arm, Patient Position: Sitting, Cuff Size: Normal)   Pulse 87   Temp (!) 97.1 F (36.2 C) (Oral)   Resp 16   Ht 5\' 4"  (1.626 m)   Wt 158 lb (71.7 kg)   SpO2 95%   BMI 27.12 kg/m  Wt Readings from Last 3 Encounters:  09/19/16 158 lb (71.7 kg)  08/02/16 159 lb (72.1 kg)  07/15/16 158 lb 8 oz (71.9 kg)     General: Appears her stated age,in NAD. HEENT: Head: normal shape and size, no sinus tenderness noted;Ears: Tm's gray and intact, normal light reflex; Nose: mucosa pink and moist, septum midline; Throat/Mouth: + PND. Teeth present, mucosa erythematous and moist, no exudate noted, no lesions or ulcerations noted.  Neck: No cervical lymphadenopathy.  Cardiovascular: Normal rate and rhythm.  Pulmonary/Chest: Normal effort and positive vesicular breath sounds. No respiratory distress. No wheezes, rales or ronchi noted.       Assessment & Plan:   Viral Upper Respiratory Infection with Cough:  Get some rest and drink plenty of water Do salt water gargles for the sore throat  Continue Zyrtec, Flonase and Mucinex She declines RX for Benzonate or Hycodan  RTC as needed or if symptoms persist.   Webb Silversmith, NP

## 2016-09-19 NOTE — Patient Instructions (Signed)
Upper Respiratory Infection, Adult Most upper respiratory infections (URIs) are caused by a virus. A URI affects the nose, throat, and upper air passages. The most common type of URI is often called "the common cold." Follow these instructions at home:  Take medicines only as told by your doctor.  Gargle warm saltwater or take cough drops to comfort your throat as told by your doctor.  Use a warm mist humidifier or inhale steam from a shower to increase air moisture. This may make it easier to breathe.  Drink enough fluid to keep your pee (urine) clear or pale yellow.  Eat soups and other clear broths.  Have a healthy diet.  Rest as needed.  Go back to work when your fever is gone or your doctor says it is okay. ? You may need to stay home longer to avoid giving your URI to others. ? You can also wear a face mask and wash your hands often to prevent spread of the virus.  Use your inhaler more if you have asthma.  Do not use any tobacco products, including cigarettes, chewing tobacco, or electronic cigarettes. If you need help quitting, ask your doctor. Contact a doctor if:  You are getting worse, not better.  Your symptoms are not helped by medicine.  You have chills.  You are getting more short of breath.  You have brown or red mucus.  You have yellow or brown discharge from your nose.  You have pain in your face, especially when you bend forward.  You have a fever.  You have puffy (swollen) neck glands.  You have pain while swallowing.  You have white areas in the back of your throat. Get help right away if:  You have very bad or constant: ? Headache. ? Ear pain. ? Pain in your forehead, behind your eyes, and over your cheekbones (sinus pain). ? Chest pain.  You have long-lasting (chronic) lung disease and any of the following: ? Wheezing. ? Long-lasting cough. ? Coughing up blood. ? A change in your usual mucus.  You have a stiff neck.  You have  changes in your: ? Vision. ? Hearing. ? Thinking. ? Mood. This information is not intended to replace advice given to you by your health care provider. Make sure you discuss any questions you have with your health care provider. Document Released: 07/06/2007 Document Revised: 09/20/2015 Document Reviewed: 04/24/2013 Elsevier Interactive Patient Education  2018 Elsevier Inc.  

## 2016-10-25 ENCOUNTER — Other Ambulatory Visit: Payer: Self-pay | Admitting: Internal Medicine

## 2016-10-25 DIAGNOSIS — E039 Hypothyroidism, unspecified: Secondary | ICD-10-CM

## 2016-10-25 MED ORDER — FUROSEMIDE 20 MG PO TABS: 10.0000 mg | ORAL_TABLET | Freq: Every day | ORAL | 5 refills | Status: DC

## 2016-12-19 ENCOUNTER — Ambulatory Visit: Payer: PPO | Admitting: Internal Medicine

## 2016-12-19 ENCOUNTER — Encounter: Payer: Self-pay | Admitting: Internal Medicine

## 2016-12-19 ENCOUNTER — Telehealth: Payer: Self-pay

## 2016-12-19 VITALS — BP 138/90 | HR 76 | Temp 97.7°F | Wt 156.0 lb

## 2016-12-19 DIAGNOSIS — S161XXA Strain of muscle, fascia and tendon at neck level, initial encounter: Secondary | ICD-10-CM

## 2016-12-19 MED ORDER — KETOROLAC TROMETHAMINE 30 MG/ML IJ SOLN
30.0000 mg | Freq: Once | INTRAMUSCULAR | Status: AC
  Administered 2016-12-19: 30 mg via INTRAMUSCULAR

## 2016-12-19 MED ORDER — TIZANIDINE HCL 2 MG PO TABS: 2.0000 mg | ORAL_TABLET | Freq: Three times a day (TID) | ORAL | 0 refills | Status: DC

## 2016-12-19 MED ORDER — PREDNISONE 10 MG PO TABS: ORAL_TABLET | ORAL | 0 refills | Status: DC

## 2016-12-19 MED ORDER — TIZANIDINE HCL 2 MG PO CAPS: 2.0000 mg | ORAL_CAPSULE | Freq: Three times a day (TID) | ORAL | 0 refills | Status: DC | PRN

## 2016-12-19 NOTE — Telephone Encounter (Signed)
Rx sent through [facility]  

## 2016-12-19 NOTE — Addendum Note (Signed)
Addended by: Lurlean Nanny on: 12/19/2016 02:36 PM   Modules accepted: Orders

## 2016-12-19 NOTE — Telephone Encounter (Signed)
Lattie Haw - pharmacist with [facility] request to change Tizanidine capsules to tablets ( they don't carry capsules). Call [phone].

## 2016-12-19 NOTE — Patient Instructions (Signed)
Neck Exercises Neck exercises can be important for many reasons:  They can help you to improve and maintain flexibility in your neck. This can be especially important as you age.  They can help to make your neck stronger. This can make movement easier.  They can reduce or prevent neck pain.  They may help your upper back.  Ask your health care provider which neck exercises would be best for you. Exercises Neck Press Repeat this exercise 10 times. Do it first thing in the morning and right before bed or as told by your health care provider. 1. Lie on your back on a firm bed or on the floor with a pillow under your head. 2. Use your neck muscles to push your head down on the pillow and straighten your spine. 3. Hold the position as well as you can. Keep your head facing up and your chin tucked. 4. Slowly count to 5 while holding this position. 5. Relax for a few seconds. Then repeat.  Isometric Strengthening Do a full set of these exercises 2 times a day or as told by your health care provider. 1. Sit in a supportive chair and place your hand on your forehead. 2. Push forward with your head and neck while pushing back with your hand. Hold for 10 seconds. 3. Relax. Then repeat the exercise 3 times. 4. Next, do thesequence again, this time putting your hand against the back of your head. Use your head and neck to push backward against the hand pressure. 5. Finally, do the same exercise on either side of your head, pushing sideways against the pressure of your hand.  Prone Head Lifts Repeat this exercise 5 times. Do this 2 times a day or as told by your health care provider. 1. Lie face-down, resting on your elbows so that your chest and upper back are raised. 2. Start with your head facing downward, near your chest. Position your chin either on or near your chest. 3. Slowly lift your head upward. Lift until you are looking straight ahead. Then continue lifting your head as far back as  you can stretch. 4. Hold your head up for 5 seconds. Then slowly lower it to your starting position.  Supine Head Lifts Repeat this exercise 8-10 times. Do this 2 times a day or as told by your health care provider. 1. Lie on your back, bending your knees to point to the ceiling and keeping your feet flat on the floor. 2. Lift your head slowly off the floor, raising your chin toward your chest. 3. Hold for 5 seconds. 4. Relax and repeat.  Scapular Retraction Repeat this exercise 5 times. Do this 2 times a day or as told by your health care provider. 1. Stand with your arms at your sides. Look straight ahead. 2. Slowly pull both shoulders backward and downward until you feel a stretch between your shoulder blades in your upper back. 3. Hold for 10-30 seconds. 4. Relax and repeat.  Contact a health care provider if:  Your neck pain or discomfort gets much worse when you do an exercise.  Your neck pain or discomfort does not improve within 2 hours after you exercise. If you have any of these problems, stop exercising right away. Do not do the exercises again unless your health care provider says that you can. Get help right away if:  You develop sudden, severe neck pain. If this happens, stop exercising right away. Do not do the exercises again unless your   health care provider says that you can. Exercises Neck Stretch  Repeat this exercise 3-5 times. 1. Do this exercise while standing or while sitting in a chair. 2. Place your feet flat on the floor, shoulder-width apart. 3. Slowly turn your head to the right. Turn it all the way to the right so you can look over your right shoulder. Do not tilt or tip your head. 4. Hold this position for 10-30 seconds. 5. Slowly turn your head to the left, to look over your left shoulder. 6. Hold this position for 10-30 seconds.  Neck Retraction Repeat this exercise 8-10 times. Do this 3-4 times a day or as told by your health care  provider. 1. Do this exercise while standing or while sitting in a sturdy chair. 2. Look straight ahead. Do not bend your neck. 3. Use your fingers to push your chin backward. Do not bend your neck for this movement. Continue to face straight ahead. If you are doing the exercise properly, you will feel a slight sensation in your throat and a stretch at the back of your neck. 4. Hold the stretch for 1-2 seconds. Relax and repeat.  This information is not intended to replace advice given to you by your health care provider. Make sure you discuss any questions you have with your health care provider. Document Released: 12/29/2014 Document Revised: 06/25/2015 Document Reviewed: 07/28/2014 Elsevier Interactive Patient Education  2017 Elsevier Inc.  

## 2016-12-19 NOTE — Telephone Encounter (Signed)
Ok for tablets instead of capsules

## 2016-12-19 NOTE — Addendum Note (Signed)
Addended by: Lurlean Nanny on: 12/19/2016 02:22 PM   Modules accepted: Orders

## 2016-12-19 NOTE — Progress Notes (Signed)
Subjective:    Patient ID: Amy Lin, female    DOB: [birth year], 83 y.o.   MRN: [id number]  HPI  Pt presents to the clinic today with c/o left sided neck pain. 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 She reports she woke up with the pain. She describes the pain as sharp and throbbing. The pain radiates up the left side of her neck and into her head, causing a headache. She denies visual changes or dizziness. She denies numbness or tingling in her left arm. She denies any injury to the area .She has tried ice, heat and Ibuprofen with minimal relief. She reports she has had problems with her neck in the past, caused by a bone spur.  Review of Systems      Past Medical History:  Diagnosis Date  . Allergy   . GERD (gastroesophageal reflux disease)   . Hyperlipidemia   . Hypertension    diuretic for edema at first  . Hypothyroidism   . Osteoarthritis, multiple sites   . Osteoporosis     Current Outpatient Medications  Medication Sig Dispense Refill  . aspirin 81 MG EC tablet Take 81 mg by mouth daily.      . cetirizine (ZYRTEC) 10 MG tablet Take 10 mg by mouth daily.      . Cholecalciferol (VITAMIN D3) 2000 UNITS TABS Take by mouth.      . Cyanocobalamin (VITAMIN B 12 PO) Take by mouth daily.    Marland Kitchen DEXILANT 60 MG capsule Take 60 mg by mouth daily.     . furosemide (LASIX) 20 MG tablet Take 0.5 tablets (10 mg total) by mouth daily. 30 tablet 5  . levothyroxine (SYNTHROID, LEVOTHROID) 75 MCG tablet TAKE 1 TABLET BY MOUTH ONCE DAILY 30 tablet 3  . OVER THE COUNTER MEDICATION Take 1 capsule by mouth daily. Probiotic    . rosuvastatin (CRESTOR) 20 MG tablet TAKE 1 TABLET BY MOUTH ONCE DAILY 90 tablet 1   No current facility-administered medications for this visit.     No Known Allergies  Family History  Problem Relation Age of Onset  . Heart disease Mother   . Stroke Father   . Hypertension Father   . Heart disease Father     . Diabetes Father     Social History   Socioeconomic History  . Marital status: Divorced    Spouse name: Not on file  . Number of children: 3  . Years of education: Not on file  . Highest education level: Not on file  Social Needs  . Financial resource strain: Not on file  . Food insecurity - worry: Not on file  . Food insecurity - inability: Not on file  . Transportation needs - medical: Not on file  . Transportation needs - non-medical: Not on file  Occupational History  . Occupation: Audiological scientist    Comment: [employer]  Tobacco Use  . Smoking status: Never Smoker  . Smokeless tobacco: Never Used  Substance and Sexual Activity  . Alcohol use: No  . Drug use: No  . Sexual activity: No  Other Topics Concern  . Not on file  Social History Narrative   3 children--- 1 died   Lives alone      No living will   Requests son and daughter as health care POAs   Would accept resuscitation attempts but no prolonged artificial ventilation   Not sure about tube  feeds     Constitutional: Pt reports headache. Denies fever, malaise, fatigue, or abrupt weight changes.  Respiratory: Denies difficulty breathing, shortness of breath, cough or sputum production.   Cardiovascular: Denies chest pain, chest tightness, palpitations or swelling in the hands or feet.  Musculoskeletal: Pt reports neck pain. Denies decrease in range of motion, difficulty with gait, or joint swelling.  Neurological: Denies dizziness, difficulty with memory, difficulty with speech or problems with balance and coordination.    No other specific complaints in a complete review of systems (except as listed in HPI above).  Objective:   Physical Exam   BP 138/90   Pulse 76   Temp 97.7 F (36.5 C) (Oral)   Wt 156 lb (70.8 kg)   SpO2 97%   BMI 26.78 kg/m  Wt Readings from Last 3 Encounters:  12/19/16 156 lb (70.8 kg)  09/19/16 158 lb (71.7 kg)  08/02/16 159 lb (72.1 kg)    General: Appears her  stated age, in NAD. Musculoskeletal: Decreased extension and rotation to the left secondary to pain. Normal flexion and rotation to the right. Bony tenderness noted over C6-C7. Neurological: Alert and oriented.    BMET    Component Value Date/Time   NA 139 07/15/2016 1659   K 4.0 07/15/2016 1659   CL 102 07/15/2016 1659   CO2 24 07/15/2016 1659   GLUCOSE 108 (H) 07/15/2016 1659   BUN 15 07/15/2016 1659   CREATININE 1.11 (H) 07/15/2016 1659   CALCIUM 9.4 07/15/2016 1659   GFRNONAA 68.59 12/04/2009 1131   GFRAA 91 03/12/2008 0928    Lipid Panel     Component Value Date/Time   CHOL 265 (H) 07/05/2016 1444   TRIG 147.0 07/05/2016 1444   TRIG 103 12/06/2005 1055   HDL 47.10 07/05/2016 1444   CHOLHDL 6 07/05/2016 1444   VLDL 29.4 07/05/2016 1444   LDLCALC 188 (H) 07/05/2016 1444    CBC    Component Value Date/Time   WBC 5.3 07/15/2016 1659   RBC 4.60 07/15/2016 1659   HGB 14.0 07/15/2016 1659   HCT 41.7 07/15/2016 1659   PLT 176 07/15/2016 1659   MCV 90.7 07/15/2016 1659   MCH 30.4 07/15/2016 1659   MCHC 33.6 07/15/2016 1659   RDW 14.2 07/15/2016 1659   LYMPHSABS 1,007 07/15/2016 1659   MONOABS 848 07/15/2016 1659   EOSABS 53 07/15/2016 1659   BASOSABS 53 07/15/2016 1659    Hgb A1C Lab Results  Component Value Date   HGBA1C 6.2 11/11/2015           Assessment & Plan:   Cervical Muscle Strain:  30 mg Toradol IM today eRx for Pred Taper x 6 days Avoid NSAIDS, take Tylenol if needed for pain eRx for Zanaflex 2 mg TID prn, sedation caution given Alternate heat and ice Neck exercises given  Return precautions discussed Webb Silversmith, NP

## 2017-01-11 ENCOUNTER — Ambulatory Visit: Payer: Self-pay | Admitting: *Deleted

## 2017-01-11 NOTE — Telephone Encounter (Signed)
Called in c/o URI symptoms.  See below documentation.   Appt was made by the agent that transferred the call to me with Webb Silversmith, FNP for 01/12/17 at 11:30.   I instructed pt to call back if she gets worse or has other concerns. She verbalized understanding.  Reason for Disposition . [1] Continuous (nonstop) coughing interferes with work or school AND [2] no improvement using cough treatment per Care Advice  Answer Assessment - Initial Assessment Questions 1. ONSET: "When did the cough begin?"      Started on Sunday morning coughing and blowing green-yellow mucus from my nose.  I'm coughing up green-yellow sputum. 2. SEVERITY: "How bad is the cough today?"      Frequent coughing 3. RESPIRATORY DISTRESS: "Describe your breathing."      My throat is scratchy but not sore.  Breathing is fine. 4. FEVER: "Do you have a fever?" If so, ask: "What is your temperature, how was it measured, and when did it start?"     I felt like I had a fever on and off.   My thermoter doesn't work.  5. SPUTUM: "Describe the color of your sputum" (clear, white, yellow, green)     I'm coughing up yellow-green sputum as well as blowing it out of my nose. 6. HEMOPTYSIS: "Are you coughing up any blood?" If so ask: "How much?" (flecks, streaks, tablespoons, etc.)     No blood. 7. CARDIAC HISTORY: "Do you have any history of heart disease?" (e.g., heart attack, congestive heart failure)      No heart problems 8. LUNG HISTORY: "Do you have any history of lung disease?"  (e.g., pulmonary embolus, asthma, emphysema)     None of the above.  Not a smoker 9. PE RISK FACTORS: "Do you have a history of blood clots?" (or: recent major surgery, recent prolonged travel, bedridden )     No blood clots 10. OTHER SYMPTOMS: "Do you have any other symptoms?" (e.g., runny nose, wheezing, chest pain)       Runny nose and coughing 11. PREGNANCY: "Is there any chance you are pregnant?" "When was your last menstrual period?"       N/A  due to age 83. TRAVEL: "Have you traveled out of the country in the last month?" (e.g., travel history, exposures)       Not that I know of.  Protocols used: COUGH - ACUTE PRODUCTIVE-A-AH

## 2017-01-12 ENCOUNTER — Ambulatory Visit: Payer: PPO | Admitting: Internal Medicine

## 2017-01-12 ENCOUNTER — Encounter: Payer: Self-pay | Admitting: Internal Medicine

## 2017-01-12 VITALS — BP 138/78 | HR 97 | Temp 98.2°F | Wt 155.0 lb

## 2017-01-12 DIAGNOSIS — J00 Acute nasopharyngitis [common cold]: Secondary | ICD-10-CM

## 2017-01-12 MED ORDER — AZITHROMYCIN 250 MG PO TABS: ORAL_TABLET | ORAL | 0 refills | Status: DC

## 2017-01-12 NOTE — Progress Notes (Signed)
HPI  Pt presents to the clinic today with c/o headache, ear fullness, nasal congestion, sore throat and cough. This started 4-5 days ago. She is blowing green mucous out of her nose. She denies ear pain or decreased hearing. The cough is productive of blood tinged green mucous. She reports she has run low grade fevers, had chills and body aches. She has tried Mucinex, Zyrtec, Flonase and Advil Cold and Sinus with some relief. She has not had sick contacts that she is aware of.  Review of Systems      Past Medical History:  Diagnosis Date  . Allergy   . GERD (gastroesophageal reflux disease)   . Hyperlipidemia   . Hypertension    diuretic for edema at first  . Hypothyroidism   . Osteoarthritis, multiple sites   . Osteoporosis     Family History  Problem Relation Age of Onset  . Heart disease Mother   . Stroke Father   . Hypertension Father   . Heart disease Father   . Diabetes Father     Social History   Socioeconomic History  . Marital status: Divorced    Spouse name: Not on file  . Number of children: 3  . Years of education: Not on file  . Highest education level: Not on file  Social Needs  . Financial resource strain: Not on file  . Food insecurity - worry: Not on file  . Food insecurity - inability: Not on file  . Transportation needs - medical: Not on file  . Transportation needs - non-medical: Not on file  Occupational History  . Occupation: Audiological scientist    Comment: [employer]  Tobacco Use  . Smoking status: Never Smoker  . Smokeless tobacco: Never Used  Substance and Sexual Activity  . Alcohol use: No  . Drug use: No  . Sexual activity: No  Other Topics Concern  . Not on file  Social History Narrative   3 children--- 1 died   Lives alone      No living will   Requests son and daughter as health care POAs   Would accept resuscitation attempts but no prolonged artificial ventilation   Not sure about tube feeds    No Known  Allergies   Constitutional: Positive headache, fatigue and fever. Denies abrupt weight changes.  HEENT:  Positive ear fullness ,nasal congestion, sore throat. Denies eye redness, eye pain, pressure behind the eyes, facial pain, ear pain, ringing in the ears, wax buildup, runny nose or bloody nose. Respiratory: Positive cough. Denies difficulty breathing or shortness of breath.  Cardiovascular: Denies chest pain, chest tightness, palpitations or swelling in the hands or feet.   No other specific complaints in a complete review of systems (except as listed in HPI above).  Objective:   BP 138/78   Pulse 97   Temp 98.2 F (36.8 C) (Oral)   Wt 155 lb (70.3 kg)   SpO2 97%   BMI 26.61 kg/m  Wt Readings from Last 3 Encounters:  01/12/17 155 lb (70.3 kg)  12/19/16 156 lb (70.8 kg)  09/19/16 158 lb (71.7 kg)      General: Appears her stated age, in NAD. HEENT: Head: normal shape and size, no sinus tenderness noted;  Ears: Tm's gray and intact, normal light reflex; Nose: mucosa pink and moist; Throat/Mouth: + PND. Teeth present, mucosa erythematous and moist, no exudate noted, no lesions or ulcerations noted.  Neck: No cervical lymphadenopathy.  Pulmonary/Chest: Normal effort and positive vesicular breath  sounds. No respiratory distress. No wheezes, rales or ronchi noted.       Assessment & Plan:   Upper Respiratory Infection:  Get some rest and drink plenty of water Do salt water gargles for the sore throat Continue Zyrtec and Flonase eRx for Azithromax x 5 days Delsym as needed for cough  RTC as needed or if symptoms persist.   Webb Silversmith, NP

## 2017-01-12 NOTE — Patient Instructions (Signed)
Upper Respiratory Infection, Adult Most upper respiratory infections (URIs) are caused by a virus. A URI affects the nose, throat, and upper air passages. The most common type of URI is often called "the common cold." Follow these instructions at home:  Take medicines only as told by your doctor.  Gargle warm saltwater or take cough drops to comfort your throat as told by your doctor.  Use a warm mist humidifier or inhale steam from a shower to increase air moisture. This may make it easier to breathe.  Drink enough fluid to keep your pee (urine) clear or pale yellow.  Eat soups and other clear broths.  Have a healthy diet.  Rest as needed.  Go back to work when your fever is gone or your doctor says it is okay. ? You may need to stay home longer to avoid giving your URI to others. ? You can also wear a face mask and wash your hands often to prevent spread of the virus.  Use your inhaler more if you have asthma.  Do not use any tobacco products, including cigarettes, chewing tobacco, or electronic cigarettes. If you need help quitting, ask your doctor. Contact a doctor if:  You are getting worse, not better.  Your symptoms are not helped by medicine.  You have chills.  You are getting more short of breath.  You have brown or red mucus.  You have yellow or brown discharge from your nose.  You have pain in your face, especially when you bend forward.  You have a fever.  You have puffy (swollen) neck glands.  You have pain while swallowing.  You have white areas in the back of your throat. Get help right away if:  You have very bad or constant: ? Headache. ? Ear pain. ? Pain in your forehead, behind your eyes, and over your cheekbones (sinus pain). ? Chest pain.  You have long-lasting (chronic) lung disease and any of the following: ? Wheezing. ? Long-lasting cough. ? Coughing up blood. ? A change in your usual mucus.  You have a stiff neck.  You have  changes in your: ? Vision. ? Hearing. ? Thinking. ? Mood. This information is not intended to replace advice given to you by your health care provider. Make sure you discuss any questions you have with your health care provider. Document Released: 07/06/2007 Document Revised: 09/20/2015 Document Reviewed: 04/24/2013 Elsevier Interactive Patient Education  2018 Elsevier Inc.  

## 2017-01-13 ENCOUNTER — Telehealth: Payer: Self-pay | Admitting: Internal Medicine

## 2017-01-13 NOTE — Telephone Encounter (Signed)
Yes, that is fine. 

## 2017-01-13 NOTE — Telephone Encounter (Signed)
Patient wanted to make sure that she could take her OTC medication with the antibiotic she was given. Told patient it was fine to treat her symptoms while taking the antibiotics.

## 2017-01-27 ENCOUNTER — Ambulatory Visit: Payer: PPO | Admitting: Internal Medicine

## 2017-01-27 ENCOUNTER — Ambulatory Visit (INDEPENDENT_AMBULATORY_CARE_PROVIDER_SITE_OTHER): Payer: PPO | Admitting: Internal Medicine

## 2017-01-27 ENCOUNTER — Ambulatory Visit (INDEPENDENT_AMBULATORY_CARE_PROVIDER_SITE_OTHER)
Admission: RE | Admit: 2017-01-27 | Discharge: 2017-01-27 | Disposition: A | Discharge status: Home / Self Care | Payer: PPO | Source: Ambulatory Visit | Attending: Internal Medicine | Admitting: Internal Medicine

## 2017-01-27 ENCOUNTER — Encounter: Payer: Self-pay | Admitting: Internal Medicine

## 2017-01-27 ENCOUNTER — Other Ambulatory Visit: Payer: Self-pay | Admitting: Internal Medicine

## 2017-01-27 VITALS — BP 132/80 | HR 77 | Temp 97.9°F | Wt 155.0 lb

## 2017-01-27 DIAGNOSIS — M542 Cervicalgia: Secondary | ICD-10-CM

## 2017-01-27 DIAGNOSIS — E039 Hypothyroidism, unspecified: Secondary | ICD-10-CM

## 2017-01-27 MED ORDER — TRAMADOL HCL 50 MG PO TABS: 50.0000 mg | ORAL_TABLET | Freq: Three times a day (TID) | ORAL | 0 refills | Status: DC | PRN

## 2017-01-27 NOTE — Progress Notes (Signed)
Subjective:    Patient ID: Amy Lin, female    DOB: April 07, 1933, 82 y.o.   MRN: [id number]  HPI  Pt presents to the clinic today with c/o left side neck pain. This started 5 weeks ago. She was seen 11/19 for the same. She was given Toradol IM, Prednisone Taper and Zanaflex. She reports the pain has not gone away, just varies in intensity. She reports the pain seemed to get worse 3 days ago. The pain radiates up to the top of her head. She denies dizziness or visual changes. She denies numbness, tingling or weakness in her left arm. She has tried Ibuprofen with minimal relief. She had an xray in 2013 when she was have some left side cervical radiculopathy which showed:   IMPRESSION:  1.  Degenerative cervical spondylosis with disc disease and facet disease. 2.  No acute bony findings. 3.  Mild multilevel bony foraminal narrowing due to uncinate spurring and facet disease. 4.  No instability/subluxation with flexion/extension.  Review of Systems  Past Medical History:  Diagnosis Date  . Allergy   . GERD (gastroesophageal reflux disease)   . Hyperlipidemia   . Hypertension    diuretic for edema at first  . Hypothyroidism   . Osteoarthritis, multiple sites   . Osteoporosis     Current Outpatient Medications  Medication Sig Dispense Refill  . aspirin 81 MG EC tablet Take 81 mg by mouth daily.      Marland Kitchen azithromycin (ZITHROMAX) 250 MG tablet Take 2 tabs today, then 1 tab daily x 4 days 6 tablet 0  . cetirizine (ZYRTEC) 10 MG tablet Take 10 mg by mouth daily.      . Cholecalciferol (VITAMIN D3) 2000 UNITS TABS Take by mouth.      . Cyanocobalamin (VITAMIN B 12 PO) Take by mouth daily.    . furosemide (LASIX) 20 MG tablet Take 0.5 tablets (10 mg total) by mouth daily. 30 tablet 5  . levothyroxine (SYNTHROID, LEVOTHROID) 75 MCG tablet TAKE 1 TABLET BY MOUTH ONCE DAILY 30 tablet 3  . OVER THE COUNTER MEDICATION Take 1 capsule by mouth daily. Probiotic    . pantoprazole (PROTONIX)  20 MG tablet     . rosuvastatin (CRESTOR) 20 MG tablet TAKE 1 TABLET BY MOUTH ONCE DAILY 90 tablet 1  . tiZANidine (ZANAFLEX) 2 MG tablet Take 1 tablet (2 mg total) 3 (three) times daily by mouth. 15 tablet 0   No current facility-administered medications for this visit.     No Known Allergies  Family History  Problem Relation Age of Onset  . Heart disease Mother   . Stroke Father   . Hypertension Father   . Heart disease Father   . Diabetes Father     Social History   Socioeconomic History  . Marital status: Divorced    Spouse name: Not on file  . Number of children: 3  . Years of education: Not on file  . Highest education level: Not on file  Social Needs  . Financial resource strain: Not on file  . Food insecurity - worry: Not on file  . Food insecurity - inability: Not on file  . Transportation needs - medical: Not on file  . Transportation needs - non-medical: Not on file  Occupational History  . Occupation: Audiological scientist    Comment: [employer]  Tobacco Use  . Smoking status: Never Smoker  . Smokeless tobacco: Never Used  Substance and Sexual Activity  . Alcohol use:  No  . Drug use: No  . Sexual activity: No  Other Topics Concern  . Not on file  Social History Narrative   3 children--- 1 died   Lives alone      No living will   Requests son and daughter as health care POAs   Would accept resuscitation attempts but no prolonged artificial ventilation   Not sure about tube feeds     Constitutional: Denies fever, malaise, fatigue, headache or abrupt weight changes.  Musculoskeletal: Pt reports neck pain. Denies difficulty with gait, or joint swelling.  Neurological: Denies dizziness, difficulty with memory, difficulty with speech or problems with balance and coordination.    No other specific complaints in a complete review of systems (except as listed in HPI above).     Objective:   Physical Exam   BP 132/80   Pulse 77   Temp 97.9 F (36.6  C) (Oral)   Wt 155 lb (70.3 kg)   SpO2 97%   BMI 26.61 kg/m  Wt Readings from Last 3 Encounters:  01/27/17 155 lb (70.3 kg)  01/12/17 155 lb (70.3 kg)  12/19/16 156 lb (70.8 kg)    General: Appears her stated age, well developed, well nourished in NAD. Skin: Warm, dry and intact. No rashes noted. HEENT: Head: normal shape and size; Eyes: sclera white, no icterus, conjunctiva pink, PERRLA and EOMs intact;  Musculoskeletal: Normal flexion and rotation to the left of the cervical spine. Decreased extension and rotation to the right. She has bony tenderness noted over the spine and of the left paracervical muscles. Neurological: Alert and oriented.  Coordination normal.  :  BMET    Component Value Date/Time   NA 139 07/15/2016 1659   K 4.0 07/15/2016 1659   CL 102 07/15/2016 1659   CO2 24 07/15/2016 1659   GLUCOSE 108 (H) 07/15/2016 1659   BUN 15 07/15/2016 1659   CREATININE 1.11 (H) 07/15/2016 1659   CALCIUM 9.4 07/15/2016 1659   GFRNONAA 68.59 12/04/2009 1131   GFRAA 91 03/12/2008 0928    Lipid Panel     Component Value Date/Time   CHOL 265 (H) 07/05/2016 1444   TRIG 147.0 07/05/2016 1444   TRIG 103 12/06/2005 1055   HDL 47.10 07/05/2016 1444   CHOLHDL 6 07/05/2016 1444   VLDL 29.4 07/05/2016 1444   LDLCALC 188 (H) 07/05/2016 1444    CBC    Component Value Date/Time   WBC 5.3 07/15/2016 1659   RBC 4.60 07/15/2016 1659   HGB 14.0 07/15/2016 1659   HCT 41.7 07/15/2016 1659   PLT 176 07/15/2016 1659   MCV 90.7 07/15/2016 1659   MCH 30.4 07/15/2016 1659   MCHC 33.6 07/15/2016 1659   RDW 14.2 07/15/2016 1659   LYMPHSABS 1,007 07/15/2016 1659   MONOABS 848 07/15/2016 1659   EOSABS 53 07/15/2016 1659   BASOSABS 53 07/15/2016 1659    Hgb A1C Lab Results  Component Value Date   HGBA1C 6.2 11/11/2015           Assessment & Plan:   Neck Pain:  Persistent Will obtain xray today Discussed possibility of PT vs MRI vs referral to neurosurgery RX for  Tramadol 50 mg TID prn #15 provided today Encouraged her to continue to take Advil and Tylenol before she takes the Tramadol Alternate heat and ice Neck exercises given  Return precautions discussed, will follow up after xray Webb Silversmith, NP

## 2017-01-27 NOTE — Patient Instructions (Signed)
Neck Exercises Neck exercises can be important for many reasons:  They can help you to improve and maintain flexibility in your neck. This can be especially important as you age.  They can help to make your neck stronger. This can make movement easier.  They can reduce or prevent neck pain.  They may help your upper back.  Ask your health care provider which neck exercises would be best for you. Exercises Neck Press Repeat this exercise 10 times. Do it first thing in the morning and right before bed or as told by your health care provider. 1. Lie on your back on a firm bed or on the floor with a pillow under your head. 2. Use your neck muscles to push your head down on the pillow and straighten your spine. 3. Hold the position as well as you can. Keep your head facing up and your chin tucked. 4. Slowly count to 5 while holding this position. 5. Relax for a few seconds. Then repeat.  Isometric Strengthening Do a full set of these exercises 2 times a day or as told by your health care provider. 1. Sit in a supportive chair and place your hand on your forehead. 2. Push forward with your head and neck while pushing back with your hand. Hold for 10 seconds. 3. Relax. Then repeat the exercise 3 times. 4. Next, do thesequence again, this time putting your hand against the back of your head. Use your head and neck to push backward against the hand pressure. 5. Finally, do the same exercise on either side of your head, pushing sideways against the pressure of your hand.  Prone Head Lifts Repeat this exercise 5 times. Do this 2 times a day or as told by your health care provider. 1. Lie face-down, resting on your elbows so that your chest and upper back are raised. 2. Start with your head facing downward, near your chest. Position your chin either on or near your chest. 3. Slowly lift your head upward. Lift until you are looking straight ahead. Then continue lifting your head as far back as  you can stretch. 4. Hold your head up for 5 seconds. Then slowly lower it to your starting position.  Supine Head Lifts Repeat this exercise 8-10 times. Do this 2 times a day or as told by your health care provider. 1. Lie on your back, bending your knees to point to the ceiling and keeping your feet flat on the floor. 2. Lift your head slowly off the floor, raising your chin toward your chest. 3. Hold for 5 seconds. 4. Relax and repeat.  Scapular Retraction Repeat this exercise 5 times. Do this 2 times a day or as told by your health care provider. 1. Stand with your arms at your sides. Look straight ahead. 2. Slowly pull both shoulders backward and downward until you feel a stretch between your shoulder blades in your upper back. 3. Hold for 10-30 seconds. 4. Relax and repeat.  Contact a health care provider if:  Your neck pain or discomfort gets much worse when you do an exercise.  Your neck pain or discomfort does not improve within 2 hours after you exercise. If you have any of these problems, stop exercising right away. Do not do the exercises again unless your health care provider says that you can. Get help right away if:  You develop sudden, severe neck pain. If this happens, stop exercising right away. Do not do the exercises again unless your   health care provider says that you can. Exercises Neck Stretch  Repeat this exercise 3-5 times. 1. Do this exercise while standing or while sitting in a chair. 2. Place your feet flat on the floor, shoulder-width apart. 3. Slowly turn your head to the right. Turn it all the way to the right so you can look over your right shoulder. Do not tilt or tip your head. 4. Hold this position for 10-30 seconds. 5. Slowly turn your head to the left, to look over your left shoulder. 6. Hold this position for 10-30 seconds.  Neck Retraction Repeat this exercise 8-10 times. Do this 3-4 times a day or as told by your health care  provider. 1. Do this exercise while standing or while sitting in a sturdy chair. 2. Look straight ahead. Do not bend your neck. 3. Use your fingers to push your chin backward. Do not bend your neck for this movement. Continue to face straight ahead. If you are doing the exercise properly, you will feel a slight sensation in your throat and a stretch at the back of your neck. 4. Hold the stretch for 1-2 seconds. Relax and repeat.  This information is not intended to replace advice given to you by your health care provider. Make sure you discuss any questions you have with your health care provider. Document Released: 12/29/2014 Document Revised: 06/25/2015 Document Reviewed: 07/28/2014 Elsevier Interactive Patient Education  2018 [employer]  

## 2017-01-30 ENCOUNTER — Other Ambulatory Visit: Payer: Self-pay | Admitting: Internal Medicine

## 2017-01-30 DIAGNOSIS — M542 Cervicalgia: Secondary | ICD-10-CM

## 2017-01-30 NOTE — Progress Notes (Signed)
m °

## 2017-02-03 DIAGNOSIS — K210 Gastro-esophageal reflux disease with esophagitis: Secondary | ICD-10-CM | POA: Diagnosis not present

## 2017-02-03 DIAGNOSIS — Z86010 Personal history of colonic polyps: Secondary | ICD-10-CM | POA: Insufficient documentation

## 2017-02-10 ENCOUNTER — Ambulatory Visit (HOSPITAL_COMMUNITY)
Admission: RE | Admit: 2017-02-10 | Discharge: 2017-02-10 | Disposition: A | Discharge status: Home / Self Care | Payer: PPO | Source: Ambulatory Visit | Attending: Internal Medicine | Admitting: Internal Medicine

## 2017-02-10 DIAGNOSIS — M5031 Other cervical disc degeneration,  high cervical region: Secondary | ICD-10-CM | POA: Insufficient documentation

## 2017-02-10 DIAGNOSIS — M542 Cervicalgia: Secondary | ICD-10-CM | POA: Diagnosis present

## 2017-02-10 DIAGNOSIS — M4802 Spinal stenosis, cervical region: Secondary | ICD-10-CM | POA: Insufficient documentation

## 2017-02-10 DIAGNOSIS — M5023 Other cervical disc displacement, cervicothoracic region: Secondary | ICD-10-CM | POA: Diagnosis not present

## 2017-02-13 ENCOUNTER — Telehealth: Payer: Self-pay

## 2017-02-13 NOTE — Telephone Encounter (Signed)
Copied from [place] [id number]. Topic: Quick Communication - Office Called Patient >> Feb 13, 2017  8:14 AM Amy Lin, NT wrote: Reason for CRM: Pt called back and said someone called her about results but no CRM noted.

## 2017-02-13 NOTE — Telephone Encounter (Signed)
MRI results sent to [facility]

## 2017-02-15 ENCOUNTER — Other Ambulatory Visit: Payer: Self-pay | Admitting: Internal Medicine

## 2017-02-15 DIAGNOSIS — M5412 Radiculopathy, cervical region: Secondary | ICD-10-CM

## 2017-03-06 DIAGNOSIS — R030 Elevated blood-pressure reading, without diagnosis of hypertension: Secondary | ICD-10-CM | POA: Diagnosis not present

## 2017-03-06 DIAGNOSIS — Z6826 Body mass index (BMI) 26.0-26.9, adult: Secondary | ICD-10-CM | POA: Diagnosis not present

## 2017-03-06 DIAGNOSIS — M47812 Spondylosis without myelopathy or radiculopathy, cervical region: Secondary | ICD-10-CM | POA: Diagnosis not present

## 2017-04-27 ENCOUNTER — Encounter: Payer: PPO | Admitting: Internal Medicine

## 2017-05-09 DIAGNOSIS — K648 Other hemorrhoids: Secondary | ICD-10-CM | POA: Insufficient documentation

## 2017-05-28 ENCOUNTER — Encounter (HOSPITAL_COMMUNITY): Payer: Self-pay | Admitting: Family Medicine

## 2017-05-28 ENCOUNTER — Ambulatory Visit (HOSPITAL_COMMUNITY)
Admission: EM | Admit: 2017-05-28 | Discharge: 2017-05-28 | Disposition: A | Discharge status: Home / Self Care | Payer: PPO | Attending: Internal Medicine | Admitting: Internal Medicine

## 2017-05-28 DIAGNOSIS — W57XXXA Bitten or stung by nonvenomous insect and other nonvenomous arthropods, initial encounter: Secondary | ICD-10-CM | POA: Diagnosis not present

## 2017-05-28 DIAGNOSIS — S30861A Insect bite (nonvenomous) of abdominal wall, initial encounter: Secondary | ICD-10-CM

## 2017-05-28 MED ORDER — DOXYCYCLINE HYCLATE 100 MG PO CAPS: 100.0000 mg | ORAL_CAPSULE | Freq: Two times a day (BID) | ORAL | 0 refills | Status: DC

## 2017-05-28 NOTE — Discharge Instructions (Addendum)
Prescribed single prophylactic dose of doxycycline 200 mg To prevent tick bites, wear long sleeves, long pants, and light colors. Use insect repellent. Follow the instructions on the bottle. If the tick is biting, do not try to remove it with heat, alcohol, petroleum jelly, or fingernail polish. Use tweezers, curved forceps, or a tick-removal tool to grasp the tick. Gently pull up until the tick lets go. Do not twist or jerk the tick. Do not squeeze or crush the tick. Return here or go to ER if you have any new or worsening symptoms (rash, nausea, vomiting, fever, chills, headache, fatigue 

## 2017-05-28 NOTE — ED Provider Notes (Signed)
[place]    CSN: [id number] Arrival date & time: 05/28/17  [place]     History   Chief Complaint Chief Complaint  Patient presents with  . Tick Removal    HPI Amy Lin is a 84 y.o. female.   Complains of attached tick for one day.  Denies a precipitating event, noticed while changing clothes after church.  Localizes the bite to her abdomen.  Has not tried removing tick.  Denies previous hx of tick bite.  Denies fever, chills, nausea, vomiting, headache, dizziness, weakness, fatigue, rash, or abdominal pain.       Past Medical History:  Diagnosis Date  . Allergy   . GERD (gastroesophageal reflux disease)   . Hyperlipidemia   . Hypertension    diuretic for edema at first  . Hypothyroidism   . Osteoarthritis, multiple sites   . Osteoporosis     Patient Active Problem List   Diagnosis Date Noted  . CERVICAL RADICULOPATHY, LEFT 02/05/2010  . Prediabetes 06/05/2009  . Seasonal allergies 11/01/2007  . Hyperlipemia 09/06/2006  . Hypothyroidism 08/24/2006  . Essential hypertension, benign 08/24/2006  . GERD 08/24/2006  . Osteoporosis 08/24/2006    Past Surgical History:  Procedure Laterality Date  . ABDOMINAL HYSTERECTOMY    . BACK SURGERY  1999   Discectomy and Ray cage  . BLADDER REPAIR  ~2010  . CARPAL TUNNEL RELEASE  8/15  . COLONOSCOPY W/ BIOPSIES N/A 2016  . MENISCECTOMY Left 2/15   Dr Veverly Fells  . UPPER GI ENDOSCOPY N/A Feb 2017    OB History   None      Home Medications    Prior to Admission medications   Medication Sig Start Date End Date Taking? Authorizing Provider  aspirin 81 MG EC tablet Take 81 mg by mouth daily.      [provider]  cetirizine (ZYRTEC) 10 MG tablet Take 10 mg by mouth daily.      [provider]  Cholecalciferol (VITAMIN D3) 2000 UNITS TABS Take by mouth.      [provider]  Cyanocobalamin (VITAMIN B 12 PO) Take by mouth daily.    [provider]  furosemide (LASIX) 20  MG tablet Take 0.5 tablets (10 mg total) by mouth daily. 10/25/16   [name], NP  levothyroxine (SYNTHROID, LEVOTHROID) 75 MCG tablet TAKE 1 TABLET BY MOUTH ONCE A DAY 01/27/17   [name], NP  OVER THE COUNTER MEDICATION Take 1 capsule by mouth daily. Probiotic    [provider]  pantoprazole (PROTONIX) 20 MG tablet  12/10/16   [provider]  rosuvastatin (CRESTOR) 20 MG tablet TAKE 1 TABLET BY MOUTH ONCE DAILY 09/02/16   [name], NP  tiZANidine (ZANAFLEX) 2 MG tablet Take 1 tablet (2 mg total) 3 (three) times daily by mouth. 12/19/16   Baity, Coralie Keens, NP  traMADol (ULTRAM) 50 MG tablet Take 1 tablet (50 mg total) by mouth every 8 (eight) hours as needed. 01/27/17   [name], NP    Family History Family History  Problem Relation Age of Onset  . Heart disease Mother   . Stroke Father   . Hypertension Father   . Heart disease Father   . Diabetes Father     Social History Social History   Tobacco Use  . Smoking status: Never Smoker  . Smokeless tobacco: Never Used  Substance Use Topics  . Alcohol use: No  . Drug use: No  Allergies   Patient has no known allergies.   Review of Systems Review of Systems  Constitutional: Negative for chills, fatigue and fever.  Respiratory: Negative for shortness of breath.   Cardiovascular: Negative for chest pain.  Gastrointestinal: Negative for abdominal pain, nausea and vomiting.  Skin: Negative for rash.       Tick bite (tick still attached)  Neurological: Negative for dizziness and weakness.     Physical Exam Triage Vital Signs ED Triage Vitals  Enc Vitals Group     BP 05/28/17 1845 (!) 152/73     Pulse Rate 05/28/17 1845 69     Resp 05/28/17 1845 18     Temp 05/28/17 1845 98.4 F (36.9 C)     Temp src --      SpO2 05/28/17 1845 97 %     Weight --      Height --      Head Circumference --      Peak Flow --      Pain Score 05/28/17 1844 0     Pain Loc --      Pain Edu?  --      Excl. in [place]? --    No data found.  Updated Vital Signs BP (!) 152/73   Pulse 69   Temp 98.4 F (36.9 C)   Resp 18   SpO2 97%   Visual Acuity Right Eye Distance:   Left Eye Distance:   Bilateral Distance:    Right Eye Near:   Left Eye Near:    Bilateral Near:     Physical Exam  Constitutional: She is oriented to person, place, and time. She appears well-developed and well-nourished. No distress.  HENT:  Head: Normocephalic and atraumatic.  Right Ear: External ear normal.  Left Ear: External ear normal.  Nose: Nose normal.  Eyes: EOM are normal.  Cardiovascular: Normal rate, regular rhythm and normal heart sounds. Exam reveals no friction rub.  No murmur heard. Radial pulse 2+ bilaterally    Pulmonary/Chest: Effort normal and breath sounds normal. No stridor. No respiratory distress. She has no wheezes. She has no rales.  Abdominal: Soft. There is no tenderness.  Small black tick without identifiable markings identified on the RLQ of the abdomen.  Tick appears flat and not engorged.  Area was cleaned with alcohol swab.  Tick was removed completely with forceps and disposed of.  Small punctate lesion with mild surrounding erythema appreciated after tick removal.  Area cleansed again with alcohol swab and band-aid placed.  Musculoskeletal:  Ambulates from chair to exam table without difficulty  Neurological: She is alert and oriented to person, place, and time.  Skin: Skin is warm and dry. Capillary refill takes 2 to 3 seconds. She is not diaphoretic.  Psychiatric: She has a normal mood and affect. Her behavior is normal. Judgment and thought content normal.  Nursing note and vitals reviewed.    UC Treatments / Results  Labs (all labs ordered are listed, but only abnormal results are displayed) Labs Reviewed - No data to display  EKG None Radiology No results found.  Procedures Procedures (including critical care time)  Medications Ordered in  UC Medications - No data to display   Initial Impression / Assessment and Plan / UC Course  I have reviewed the triage vital signs and the nursing notes.  Pertinent labs & imaging results that were available during my care of the patient were reviewed by me and considered in my medical decision making (see  chart for details).     Tick removed from RLQ of the abdomen.  Patient received 200 mg of doxycycline for prophylactic treatment.  Return and ER precautions given.    Final Clinical Impressions(s) / UC Diagnoses   Final diagnoses:  None    ED Discharge Orders    None       Controlled Substance Prescriptions [place] Controlled Substance Registry consulted? Not Applicable   Lestine Box, [place] 05/28/17 1940

## 2017-05-28 NOTE — ED Triage Notes (Signed)
Pt here for tick bite to abdomen. Tick is still intact.

## 2017-05-29 ENCOUNTER — Telehealth: Payer: Self-pay | Admitting: Internal Medicine

## 2017-05-29 ENCOUNTER — Other Ambulatory Visit: Payer: Self-pay | Admitting: Internal Medicine

## 2017-05-29 ENCOUNTER — Telehealth (HOSPITAL_COMMUNITY): Payer: Self-pay

## 2017-05-29 DIAGNOSIS — E039 Hypothyroidism, unspecified: Secondary | ICD-10-CM

## 2017-05-29 MED ORDER — DOXYCYCLINE HYCLATE 100 MG PO CAPS: 100.0000 mg | ORAL_CAPSULE | Freq: Two times a day (BID) | ORAL | 0 refills | Status: AC

## 2017-05-29 NOTE — Telephone Encounter (Signed)
Doxycycline can cause GI symptoms. Was she taking it with food?

## 2017-05-29 NOTE — Telephone Encounter (Signed)
Pt called concerned after throwing up 20 min after taking a one time dose of doxycyline prescribed to her by Dr. Valere Dross on 05/28/17. Per [name] okay to reorder medication, pt educated to take medication with food to prevent vomiting again. Pt verbalized understanding and is appreciative.

## 2017-05-29 NOTE — Telephone Encounter (Signed)
Copied from [place] [id number]. Topic: Quick Communication - See Telephone Encounter >> May 29, 2017  8:20 AM Robina Ade, Helene Kelp D wrote: CRM for notification. See Telephone encounter for: 05/29/17. Patient called and said that she went to [facility] and saw Tanzania Wurst/Laura Earley Brooke for a tick and they gave her doxycycline (VIBRAMYCIN) 100 MG capsule. She said that after taking it she vomite therefore medication did not stay in her body. She would like to talk to provider or CMA about what happen and what to do. Please call patient back, thanks.

## 2017-05-30 NOTE — Telephone Encounter (Signed)
It looks like pt already spoke to nurse from ED see other telephone note

## 2017-06-21 NOTE — Telephone Encounter (Signed)
Per imaging tab pt notified 02/14/17.

## 2017-06-22 ENCOUNTER — Other Ambulatory Visit: Payer: Self-pay

## 2017-06-22 ENCOUNTER — Encounter (HOSPITAL_COMMUNITY): Payer: Self-pay | Admitting: Emergency Medicine

## 2017-06-22 ENCOUNTER — Ambulatory Visit (HOSPITAL_COMMUNITY)
Admission: EM | Admit: 2017-06-22 | Discharge: 2017-06-22 | Disposition: A | Discharge status: Home / Self Care | Payer: PPO | Attending: Family Medicine | Admitting: Family Medicine

## 2017-06-22 DIAGNOSIS — L03319 Cellulitis of trunk, unspecified: Secondary | ICD-10-CM | POA: Diagnosis not present

## 2017-06-22 MED ORDER — DOXYCYCLINE HYCLATE 100 MG PO CAPS: 100.0000 mg | ORAL_CAPSULE | Freq: Two times a day (BID) | ORAL | 0 refills | Status: AC

## 2017-06-22 MED ORDER — HYDROCORTISONE 2.5 % EX LOTN: TOPICAL_LOTION | Freq: Two times a day (BID) | CUTANEOUS | 0 refills | Status: DC

## 2017-06-22 NOTE — ED Provider Notes (Signed)
[place]    CSN: [id number] Arrival date & time: 06/22/17  1625     History   Chief Complaint Chief Complaint  Patient presents with  . Tick Removal    HPI Amy Lin is a 84 y.o. female.   Amy Lin presents with complaints of redness and tenderness to area she removed a tick from earlier today. States she showered yesterday am and did not have a tick there so it bit her sometime between yesterday morning and this morning. Was bitten last month as well but states it did not get this red or painful. She feels the tick was flat and was not engorged. Otherwise with fevers, body aches or other rash. No active drainage. Hx of allergies, gerd, htn, hypothyroidism, OA.     ROS per HPI.      Past Medical History:  Diagnosis Date  . Allergy   . GERD (gastroesophageal reflux disease)   . Hyperlipidemia   . Hypertension    diuretic for edema at first  . Hypothyroidism   . Osteoarthritis, multiple sites   . Osteoporosis     Patient Active Problem List   Diagnosis Date Noted  . CERVICAL RADICULOPATHY, LEFT 02/05/2010  . Prediabetes 06/05/2009  . Seasonal allergies 11/01/2007  . Hyperlipemia 09/06/2006  . Hypothyroidism 08/24/2006  . Essential hypertension, benign 08/24/2006  . GERD 08/24/2006  . Osteoporosis 08/24/2006    Past Surgical History:  Procedure Laterality Date  . ABDOMINAL HYSTERECTOMY    . BACK SURGERY  1999   Discectomy and Ray cage  . BLADDER REPAIR  ~2010  . CARPAL TUNNEL RELEASE  8/15  . COLONOSCOPY W/ BIOPSIES N/A 2016  . MENISCECTOMY Left 2/15   Dr Veverly Fells  . UPPER GI ENDOSCOPY N/A Feb 2017    OB History   None      Home Medications    Prior to Admission medications   Medication Sig Start Date End Date Taking? Authorizing Provider  aspirin 81 MG EC tablet Take 81 mg by mouth daily.      [provider]  cetirizine (ZYRTEC) 10 MG tablet Take 10 mg by mouth daily.      [provider]  Cholecalciferol  (VITAMIN D3) 2000 UNITS TABS Take by mouth.      [provider]  Cyanocobalamin (VITAMIN B 12 PO) Take by mouth daily.    [provider]  doxycycline (VIBRAMYCIN) 100 MG capsule Take 1 capsule (100 mg total) by mouth 2 (two) times daily for 7 days. 06/22/17 06/29/17  Zigmund Gottron, NP  furosemide (LASIX) 20 MG tablet Take 0.5 tablets (10 mg total) by mouth daily. 10/25/16   [name], NP  hydrocortisone 2.5 % lotion Apply topically 2 (two) times daily. 06/22/17   Zigmund Gottron, NP  levothyroxine (SYNTHROID, LEVOTHROID) 75 MCG tablet Take 1 tablet (75 mcg total) by mouth daily. MUST SCHEDULE LAB ONLY VISIT 05/30/17   [name], NP  OVER THE COUNTER MEDICATION Take 1 capsule by mouth daily. Probiotic    [provider]  pantoprazole (PROTONIX) 20 MG tablet  12/10/16   [provider]  rosuvastatin (CRESTOR) 20 MG tablet TAKE 1 TABLET BY MOUTH ONCE DAILY 09/02/16   [name], NP  tiZANidine (ZANAFLEX) 2 MG tablet Take 1 tablet (2 mg total) 3 (three) times daily by mouth. 12/19/16   Baity, Coralie Keens, NP  traMADol (ULTRAM) 50 MG tablet Take 1 tablet (50 mg total) by mouth every 8 (  eight) hours as needed. 01/27/17   [name], NP    Family History Family History  Problem Relation Age of Onset  . Heart disease Mother   . Stroke Father   . Hypertension Father   . Heart disease Father   . Diabetes Father     Social History Social History   Tobacco Use  . Smoking status: Never Smoker  . Smokeless tobacco: Never Used  Substance Use Topics  . Alcohol use: No  . Drug use: No     Allergies   Patient has no known allergies.   Review of Systems Review of Systems   Physical Exam Triage Vital Signs ED Triage Vitals  Enc Vitals Group     BP 06/22/17 1706 (!) 156/70     Pulse Rate 06/22/17 1706 63     Resp 06/22/17 1706 18     Temp 06/22/17 1706 98.5 F (36.9 C)     Temp Source 06/22/17 1706 Oral     SpO2 06/22/17 1706 99 %       Weight --      Height --      Head Circumference --      Peak Flow --      Pain Score 06/22/17 1703 0     Pain Loc --      Pain Edu? --      Excl. in [place]? --    No data found.  Updated Vital Signs BP (!) 156/70 (BP Location: Left Arm)   Pulse 63   Temp 98.5 F (36.9 C) (Oral)   Resp 18   SpO2 99%   Visual Acuity Right Eye Distance:   Left Eye Distance:   Bilateral Distance:    Right Eye Near:   Left Eye Near:    Bilateral Near:     Physical Exam  Constitutional: She is oriented to person, place, and time. She appears well-developed and well-nourished. No distress.  Cardiovascular: Normal rate, regular rhythm and normal heart sounds.  Pulmonary/Chest: Effort normal and breath sounds normal.  Redness at site of bug bite, firm, tender; approximately 1.5cm in diameter without active drainage; no presence of tick but small area near center of redness with noticeable break in skin from probable bite and tick removal     Neurological: She is alert and oriented to person, place, and time.  Skin: Skin is warm and dry.     UC Treatments / Results  Labs (all labs ordered are listed, but only abnormal results are displayed) Labs Reviewed - No data to display  EKG None  Radiology No results found.  Procedures Procedures (including critical care time)  Medications Ordered in UC Medications - No data to display  Initial Impression / Assessment and Plan / UC Course  I have reviewed the triage vital signs and the nursing notes.  Pertinent labs & imaging results that were available during my care of the patient were reviewed by me and considered in my medical decision making (see chart for details).     Discussed irritation vs infection with patient. Tender with increasing redness, opted to treat with antibiotics at this time. Hydrocortisone topically for itching. Return precautions provided. Patient verbalized understanding and agreeable to plan.    Final Clinical  Impressions(s) / UC Diagnoses   Final diagnoses:  Cellulitis of trunk, unspecified site of trunk     Discharge Instructions     I am concerned about infection to the area of the bug bite. We will  treat this with 7 days of antibiotics. Warm compresses may also be applied. Hydrocortisone cream for itching and redness. If symptoms worsen or do not improve in the next week to return to be seen or to follow up with your PCP.     ED Prescriptions    Medication Sig Dispense Auth. Provider   doxycycline (VIBRAMYCIN) 100 MG capsule Take 1 capsule (100 mg total) by mouth 2 (two) times daily for 7 days. 14 capsule [name] B, NP   hydrocortisone 2.5 % lotion Apply topically 2 (two) times daily. 59 mL [name] B, NP     Controlled Substance Prescriptions [place] Controlled Substance Registry consulted? Not Applicable   Zigmund Gottron, NP 06/22/17 1758

## 2017-06-22 NOTE — Discharge Instructions (Signed)
I am concerned about infection to the area of the bug bite. We will treat this with 7 days of antibiotics. Warm compresses may also be applied. Hydrocortisone cream for itching and redness. If symptoms worsen or do not improve in the next week to return to be seen or to follow up with your PCP.

## 2017-06-22 NOTE — ED Triage Notes (Signed)
Patient removed a tick from her right side.  Patient has noticed redness getting larger.

## 2017-06-24 ENCOUNTER — Other Ambulatory Visit: Payer: Self-pay | Admitting: Internal Medicine

## 2017-06-24 DIAGNOSIS — E039 Hypothyroidism, unspecified: Secondary | ICD-10-CM

## 2017-07-20 ENCOUNTER — Encounter: Payer: Self-pay | Admitting: Internal Medicine

## 2017-07-20 ENCOUNTER — Ambulatory Visit (INDEPENDENT_AMBULATORY_CARE_PROVIDER_SITE_OTHER): Payer: PPO | Admitting: Internal Medicine

## 2017-07-20 VITALS — BP 134/80 | HR 67 | Temp 97.7°F | Ht 64.0 in | Wt 151.0 lb

## 2017-07-20 DIAGNOSIS — M19071 Primary osteoarthritis, right ankle and foot: Secondary | ICD-10-CM | POA: Diagnosis not present

## 2017-07-20 DIAGNOSIS — J302 Other seasonal allergic rhinitis: Secondary | ICD-10-CM | POA: Diagnosis not present

## 2017-07-20 DIAGNOSIS — E7800 Pure hypercholesterolemia, unspecified: Secondary | ICD-10-CM | POA: Diagnosis not present

## 2017-07-20 DIAGNOSIS — K2100 Gastro-esophageal reflux disease with esophagitis, without bleeding: Secondary | ICD-10-CM

## 2017-07-20 DIAGNOSIS — Z0000 Encounter for general adult medical examination without abnormal findings: Secondary | ICD-10-CM

## 2017-07-20 DIAGNOSIS — M1990 Unspecified osteoarthritis, unspecified site: Secondary | ICD-10-CM | POA: Insufficient documentation

## 2017-07-20 DIAGNOSIS — E039 Hypothyroidism, unspecified: Secondary | ICD-10-CM | POA: Diagnosis not present

## 2017-07-20 DIAGNOSIS — I10 Essential (primary) hypertension: Secondary | ICD-10-CM

## 2017-07-20 DIAGNOSIS — M810 Age-related osteoporosis without current pathological fracture: Secondary | ICD-10-CM

## 2017-07-20 DIAGNOSIS — M19072 Primary osteoarthritis, left ankle and foot: Secondary | ICD-10-CM

## 2017-07-20 DIAGNOSIS — E559 Vitamin D deficiency, unspecified: Secondary | ICD-10-CM | POA: Diagnosis not present

## 2017-07-20 LAB — LIPID PANEL
CHOLESTEROL: 251 mg/dL — AB (ref 0–200)
HDL: 50.8 mg/dL (ref >39.00)
LDL CALC: 178 mg/dL — AB (ref 0–99)
NonHDL: 200.27
TRIGLYCERIDES: 110 mg/dL (ref 0.0–149.0)
Total CHOL/HDL Ratio: 5
VLDL: 22 mg/dL (ref 0.0–40.0)

## 2017-07-20 LAB — COMPREHENSIVE METABOLIC PANEL
ALT: 10 U/L (ref 0–35)
AST: 15 U/L (ref 0–37)
Albumin: 4 g/dL (ref 3.5–5.2)
Alkaline Phosphatase: 63 U/L (ref 39–117)
BUN: 17 mg/dL (ref 6–23)
CO2: 29 mEq/L (ref 19–32)
Calcium: 9.7 mg/dL (ref 8.4–10.5)
Chloride: 105 mEq/L (ref 96–112)
Creatinine, Ser: 0.88 mg/dL (ref 0.40–1.20)
GFR: 65.47 mL/min (ref >60.00)
GLUCOSE: 97 mg/dL (ref 70–99)
POTASSIUM: 4 meq/L (ref 3.5–5.1)
SODIUM: 142 meq/L (ref 135–145)
Total Bilirubin: 0.5 mg/dL (ref 0.2–1.2)
Total Protein: 7.3 g/dL (ref 6.0–8.3)

## 2017-07-20 LAB — VITAMIN D 25 HYDROXY (VIT D DEFICIENCY, FRACTURES): VITD: 48.67 ng/mL (ref 30.00–100.00)

## 2017-07-20 LAB — CBC
HEMATOCRIT: 40.6 % (ref 36.0–46.0)
HEMOGLOBIN: 13.9 g/dL (ref 12.0–15.0)
MCHC: 34.2 g/dL (ref 30.0–36.0)
MCV: 91.6 fl (ref 78.0–100.0)
Platelets: 197 10*3/uL (ref 150.0–400.0)
RBC: 4.43 Mil/uL (ref 3.87–5.11)
RDW: 13.3 % (ref 11.5–15.5)
WBC: 6.5 10*3/uL (ref 4.0–10.5)

## 2017-07-20 LAB — T4, FREE: Free T4: 1.04 ng/dL (ref 0.60–1.60)

## 2017-07-20 LAB — TSH: TSH: 0.9 u[IU]/mL (ref 0.35–4.50)

## 2017-07-20 NOTE — Assessment & Plan Note (Signed)
Bone density ordered Continue Calcium and Vit D Try to get 30 minutes of weight bearing exercise daily

## 2017-07-20 NOTE — Assessment & Plan Note (Signed)
TSH and Free T4 today Will adjust Levothyroxine if needed based on labs 

## 2017-07-20 NOTE — Assessment & Plan Note (Signed)
Controlled on Lasix Reinforced DASH diet CBC and CMET today

## 2017-07-20 NOTE — Assessment & Plan Note (Signed)
Continue Tylenol and Ibuprofen prn Will monitor

## 2017-07-20 NOTE — Progress Notes (Signed)
HPI:  Pt presents to the clinic today for her Medicare Wellness Exam. She is also due to follow up chronic conditions.  Seasonal Allergies: Worse in the spring and fall. She takes Zyrtec daily. She takes Flonase as needed because it dries her out too much causing her nose to bleed.  GERD: She denies breakthrough symptoms on Pantoprazole. Upper GI from 03/2015 reviewed.  HLD: Her last LDL was 188, 07/2016. She is taking Rosuvastatin as prescribed. She denies myalgias. She tries to consume a low fat diet.  HTN: Her BP today is 134/80. She takes Lasix as prescribed. ECG from 12/2015 reviewed.   Hypothyroidism: She is taking Levothyroxine as prescribed. She denies any issues on her current dose. She is due to have her labs repeated today.   Osteoarthritis: Mainly in her feet. She takes Ibuprofen and Tylenol as needed with some relief.  Osteoporosis: Last bone density from 01/2016 reviewed. She takes Calcium and Vit D OTC.  Past Medical History:  Diagnosis Date  . Allergy   . GERD (gastroesophageal reflux disease)   . Hyperlipidemia   . Hypertension    diuretic for edema at first  . Hypothyroidism   . Osteoarthritis, multiple sites   . Osteoporosis     Current Outpatient Medications  Medication Sig Dispense Refill  . aspirin 81 MG EC tablet Take 81 mg by mouth daily.      . cetirizine (ZYRTEC) 10 MG tablet Take 10 mg by mouth daily.      . Cholecalciferol (VITAMIN D3) 2000 UNITS TABS Take by mouth.      . Cyanocobalamin (VITAMIN B 12 PO) Take by mouth daily.    . furosemide (LASIX) 20 MG tablet Take 0.5 tablets (10 mg total) by mouth daily. 30 tablet 5  . hydrocortisone 2.5 % lotion Apply topically 2 (two) times daily. 59 mL 0  . levothyroxine (SYNTHROID, LEVOTHROID) 75 MCG tablet Take 1 tablet (75 mcg total) by mouth daily before breakfast. 30 tablet 0  . OVER THE COUNTER MEDICATION Take 1 capsule by mouth daily. Probiotic    . pantoprazole (PROTONIX) 20 MG tablet     .  rosuvastatin (CRESTOR) 20 MG tablet TAKE 1 TABLET BY MOUTH ONCE DAILY 90 tablet 1   No current facility-administered medications for this visit.     No Known Allergies  Family History  Problem Relation Age of Onset  . Heart disease Mother   . Stroke Father   . Hypertension Father   . Heart disease Father   . Diabetes Father     Social History   Socioeconomic History  . Marital status: Divorced    Spouse name: Not on file  . Number of children: 3  . Years of education: Not on file  . Highest education level: Not on file  Occupational History  . Occupation: Audiological scientist    Comment: [employer]  . Financial resource strain: Not on file  . Food insecurity:    Worry: Not on file    Inability: Not on file  . Transportation needs:    Medical: Not on file    Non-medical: Not on file  Tobacco Use  . Smoking status: Never Smoker  . Smokeless tobacco: Never Used  Substance and Sexual Activity  . Alcohol use: No  . Drug use: No  . Sexual activity: Never  Lifestyle  . Physical activity:    Days per week: Not on file    Minutes per session: Not on file  .  Stress: Not on file  Relationships  . Social connections:    Talks on phone: Not on file    Gets together: Not on file    Attends religious service: Not on file    Active member of club or organization: Not on file    Attends meetings of clubs or organizations: Not on file    Relationship status: Not on file  . Intimate partner violence:    Fear of current or ex partner: Not on file    Emotionally abused: Not on file    Physically abused: Not on file    Forced sexual activity: Not on file  Other Topics Concern  . Not on file  Social History Narrative   3 children--- 1 died   Lives alone      No living will   Requests son and daughter as health care POAs   Would accept resuscitation attempts but no prolonged artificial ventilation   Not sure about tube feeds    Hospitiliaztions:  None  Health Maintenance:    Flu: 10/2016  Tetanus: 01/2006  Pneumovax: 01/2006  Prevnar: 05/2014  Zostavax: 07/2013  Mammogram: 01/2016  Pap Smear: hysterctomy  Bone Density: 01/2016  Colon Screening: 01/2015  Eye Doctor: annually  Dental Exam: as needed   Providers:   PCP: Webb Silversmith, NP-C   Gastroenterologist: Dr. Earlean Shawl:     I have personally reviewed and have noted:  1. The patient's medical and social history 2. Their use of alcohol, tobacco or illicit drugs 3. Their current medications and supplements 4. The patient's functional ability including ADL's, fall risks, home safety risks and hearing or visual impairment. 5. Diet and physical activities 6. Evidence for depression or mood disorder  Subjective:   Review of Systems:   Constitutional: Denies fever, malaise, fatigue, headache or abrupt weight changes.  HEENT: Denies eye pain, eye redness, ear pain, ringing in the ears, wax buildup, runny nose, nasal congestion, bloody nose, or sore throat. Respiratory: Denies difficulty breathing, shortness of breath, cough or sputum production.   Cardiovascular: Denies chest pain, chest tightness, palpitations or swelling in the hands or feet.  Gastrointestinal: Pt reports loose stools. Denies abdominal pain, bloating, constipation, or blood in the stool.  GU: Denies urgency, frequency, pain with urination, burning sensation, blood in urine, odor or discharge. Musculoskeletal: Pt reports joint pain in feet. Denies decrease in range of motion, difficulty with gait, muscle pain or joint swelling.  Skin: Denies redness, rashes, lesions or ulcercations.  Neurological: Denies dizziness, difficulty with memory, difficulty with speech or problems with balance and coordination.  Psych: Denies anxiety, depression, SI/HI.  No other specific complaints in a complete review of systems (except as listed in HPI above).  Objective:  PE:   BP 134/80   Pulse 67   Temp 97.7 F (36.5  C) (Oral)   Ht 5\' 4"  (1.626 m)   Wt 151 lb (68.5 kg)   SpO2 97%   BMI 25.92 kg/m  Wt Readings from Last 3 Encounters:  07/20/17 151 lb (68.5 kg)  01/27/17 155 lb (70.3 kg)  01/12/17 155 lb (70.3 kg)    General: Appears her stated age, well developed, well nourished in NAD. Skin: Warm, dry and intact. Varicose veins and spider veins noted of BLE. HEENT: Head: normal shape and size; Eyes: sclera white, no icterus, conjunctiva pink, PERRLA and EOMs intact; Ears: Tm's gray and intact, normal light reflex; Throat/Mouth: Teeth present, mucosa pink and moist, + PND, no exudate, lesions or  ulcerations noted.  Neck: Neck supple, trachea midline. No masses, lumps present.  Cardiovascular: Normal rate and rhythm. ? Murmur noted. No JVD or BLE edema. No carotid bruits noted. Pulmonary/Chest: Normal effort and positive vesicular breath sounds. No respiratory distress. No wheezes, rales or ronchi noted.  Abdomen: Soft and nontender. Normal bowel sounds. No distention or masses noted. Liver, spleen and kidneys non palpable. Musculoskeletal: Kyphotic. Strength 5/5 BUE/BLE. No difficulty with gait.  Neurological: Alert and oriented. Cranial nerves II-XII grossly intact. Coordination normal.  Psychiatric: Mood and affect normal. Behavior is normal. Judgment and thought content normal.     BMET    Component Value Date/Time   NA 139 07/15/2016 1659   K 4.0 07/15/2016 1659   CL 102 07/15/2016 1659   CO2 24 07/15/2016 1659   GLUCOSE 108 (H) 07/15/2016 1659   BUN 15 07/15/2016 1659   CREATININE 1.11 (H) 07/15/2016 1659   CALCIUM 9.4 07/15/2016 1659   GFRNONAA 68.59 12/04/2009 1131   GFRAA 91 03/12/2008 0928    Lipid Panel     Component Value Date/Time   CHOL 265 (H) 07/05/2016 1444   TRIG 147.0 07/05/2016 1444   TRIG 103 12/06/2005 1055   HDL 47.10 07/05/2016 1444   CHOLHDL 6 07/05/2016 1444   VLDL 29.4 07/05/2016 1444   LDLCALC 188 (H) 07/05/2016 1444    CBC    Component Value  Date/Time   WBC 5.3 07/15/2016 1659   RBC 4.60 07/15/2016 1659   HGB 14.0 07/15/2016 1659   HCT 41.7 07/15/2016 1659   PLT 176 07/15/2016 1659   MCV 90.7 07/15/2016 1659   MCH 30.4 07/15/2016 1659   MCHC 33.6 07/15/2016 1659   RDW 14.2 07/15/2016 1659   LYMPHSABS 1,007 07/15/2016 1659   MONOABS 848 07/15/2016 1659   EOSABS 53 07/15/2016 1659   BASOSABS 53 07/15/2016 1659    Hgb A1C Lab Results  Component Value Date   HGBA1C 6.2 11/11/2015      Assessment and Plan:   Medicare Annual Wellness Visit:  Diet: She does eat meat. She consumes more fruits than veggies. She tries to avoid fried foods. She drinks mostly water. Physical activity: Sedentary Depression/mood screen: Negative Hearing: Intact to whispered voice Visual acuity: Grossly normal, performs annual eye exam  ADLs: Capable Fall risk: None Home safety: Good Cognitive evaluation: Intact to orientation, naming, recall and repetition EOL planning: No adv directives, full code/ I agree  Preventative Medicine: Encouraged her to get a flu shot in the fall. She declines tetanus vaccine for financial reasons. Pneumovax, prevnar and zostovax UTD. She declines shingrix vaccine. Mammogram and bone density ordered- she will call [place] to schedule. She declines pelvic exam, colon cancer screening. Encouraged her to consume a balanced diet and exercise regimen. Advised her to see an eye doctor and dentist annually. Will check CBC, CMET, Lipid, TSH, Free T4, and Vit D today.   Next appointment: 1 year Medicare Wellness Exam, Follow up Chronic Conditions   Webb Silversmith, NP

## 2017-07-20 NOTE — Assessment & Plan Note (Signed)
CMET and Lipid profile today Encouraged her to consume a low fat diet Continue Rosuvastatin for now, will adjust if needed

## 2017-07-20 NOTE — Assessment & Plan Note (Signed)
Controlled on Pantoprazole CBC and CMET today Discussed avoiding foods that trigger reflux She will continue to follow with GI

## 2017-07-20 NOTE — Assessment & Plan Note (Signed)
Continue Zyrtec and Flonase Will monitor

## 2017-07-20 NOTE — Patient Instructions (Signed)
Health Maintenance for Postmenopausal Women Menopause is a normal process in which your reproductive ability comes to an end. This process happens gradually over a span of months to years, usually between the ages of 22 and 9. Menopause is complete when you have missed 12 consecutive menstrual periods. It is important to talk with your health care provider about some of the most common conditions that affect postmenopausal women, such as heart disease, cancer, and bone loss (osteoporosis). Adopting a healthy lifestyle and getting preventive care can help to promote your health and wellness. Those actions can also lower your chances of developing some of these common conditions. What should I know about menopause? During menopause, you may experience a number of symptoms, such as:  Moderate-to-severe hot flashes.  Night sweats.  Decrease in sex drive.  Mood swings.  Headaches.  Tiredness.  Irritability.  Memory problems.  Insomnia.  Choosing to treat or not to treat menopausal changes is an individual decision that you make with your health care provider. What should I know about hormone replacement therapy and supplements? Hormone therapy products are effective for treating symptoms that are associated with menopause, such as hot flashes and night sweats. Hormone replacement carries certain risks, especially as you become older. If you are thinking about using estrogen or estrogen with progestin treatments, discuss the benefits and risks with your health care provider. What should I know about heart disease and stroke? Heart disease, heart attack, and stroke become more likely as you age. This may be due, in part, to the hormonal changes that your body experiences during menopause. These can affect how your body processes dietary fats, triglycerides, and cholesterol. Heart attack and stroke are both medical emergencies. There are many things that you can do to help prevent heart disease  and stroke:  Have your blood pressure checked at least every 1-2 years. High blood pressure causes heart disease and increases the risk of stroke.  If you are 53-22 years old, ask your health care provider if you should take aspirin to prevent a heart attack or a stroke.  Do not use any tobacco products, including cigarettes, chewing tobacco, or electronic cigarettes. If you need help quitting, ask your health care provider.  It is important to eat a healthy diet and maintain a healthy weight. ? Be sure to include plenty of vegetables, fruits, low-fat dairy products, and lean protein. ? Avoid eating foods that are high in solid fats, added sugars, or salt (sodium).  Get regular exercise. This is one of the most important things that you can do for your health. ? Try to exercise for at least 150 minutes each week. The type of exercise that you do should increase your heart rate and make you sweat. This is known as moderate-intensity exercise. ? Try to do strengthening exercises at least twice each week. Do these in addition to the moderate-intensity exercise.  Know your numbers.Ask your health care provider to check your cholesterol and your blood glucose. Continue to have your blood tested as directed by your health care provider.  What should I know about cancer screening? There are several types of cancer. Take the following steps to reduce your risk and to catch any cancer development as early as possible. Breast Cancer  Practice breast self-awareness. ? This means understanding how your breasts normally appear and feel. ? It also means doing regular breast self-exams. Let your health care provider know about any changes, no matter how small.  If you are 40  or older, have a clinician do a breast exam (clinical breast exam or CBE) every year. Depending on your age, family history, and medical history, it may be recommended that you also have a yearly breast X-ray (mammogram).  If you  have a family history of breast cancer, talk with your health care provider about genetic screening.  If you are at high risk for breast cancer, talk with your health care provider about having an MRI and a mammogram every year.  Breast cancer (BRCA) gene test is recommended for women who have family members with BRCA-related cancers. Results of the assessment will determine the need for genetic counseling and BRCA1 and for BRCA2 testing. BRCA-related cancers include these types: ? Breast. This occurs in males or females. ? Ovarian. ? Tubal. This may also be called fallopian tube cancer. ? Cancer of the abdominal or pelvic lining (peritoneal cancer). ? Prostate. ? Pancreatic.  Cervical, Uterine, and Ovarian Cancer Your health care provider may recommend that you be screened regularly for cancer of the pelvic organs. These include your ovaries, uterus, and vagina. This screening involves a pelvic exam, which includes checking for microscopic changes to the surface of your cervix (Pap test).  For women ages 21-65, health care providers may recommend a pelvic exam and a Pap test every three years. For women ages 79-65, they may recommend the Pap test and pelvic exam, combined with testing for human papilloma virus (HPV), every five years. Some types of HPV increase your risk of cervical cancer. Testing for HPV may also be done on women of any age who have unclear Pap test results.  Other health care providers may not recommend any screening for nonpregnant women who are considered low risk for pelvic cancer and have no symptoms. Ask your health care provider if a screening pelvic exam is right for you.  If you have had past treatment for cervical cancer or a condition that could lead to cancer, you need Pap tests and screening for cancer for at least 20 years after your treatment. If Pap tests have been discontinued for you, your risk factors (such as having a new sexual partner) need to be  reassessed to determine if you should start having screenings again. Some women have medical problems that increase the chance of getting cervical cancer. In these cases, your health care provider may recommend that you have screening and Pap tests more often.  If you have a family history of uterine cancer or ovarian cancer, talk with your health care provider about genetic screening.  If you have vaginal bleeding after reaching menopause, tell your health care provider.  There are currently no reliable tests available to screen for ovarian cancer.  Lung Cancer Lung cancer screening is recommended for adults 69-62 years old who are at high risk for lung cancer because of a history of smoking. A yearly low-dose CT scan of the lungs is recommended if you:  Currently smoke.  Have a history of at least 30 pack-years of smoking and you currently smoke or have quit within the past 15 years. A pack-year is smoking an average of one pack of cigarettes per day for one year.  Yearly screening should:  Continue until it has been 15 years since you quit.  Stop if you develop a health problem that would prevent you from having lung cancer treatment.  Colorectal Cancer  This type of cancer can be detected and can often be prevented.  Routine colorectal cancer screening usually begins at  age 42 and continues through age 45.  If you have risk factors for colon cancer, your health care provider may recommend that you be screened at an earlier age.  If you have a family history of colorectal cancer, talk with your health care provider about genetic screening.  Your health care provider may also recommend using home test kits to check for hidden blood in your stool.  A small camera at the end of a tube can be used to examine your colon directly (sigmoidoscopy or colonoscopy). This is done to check for the earliest forms of colorectal cancer.  Direct examination of the colon should be repeated every  5-10 years until age 71. However, if early forms of precancerous polyps or small growths are found or if you have a family history or genetic risk for colorectal cancer, you may need to be screened more often.  Skin Cancer  Check your skin from head to toe regularly.  Monitor any moles. Be sure to tell your health care provider: ? About any new moles or changes in moles, especially if there is a change in a mole's shape or color. ? If you have a mole that is larger than the size of a pencil eraser.  If any of your family members has a history of skin cancer, especially at a young age, talk with your health care provider about genetic screening.  Always use sunscreen. Apply sunscreen liberally and repeatedly throughout the day.  Whenever you are outside, protect yourself by wearing long sleeves, pants, a wide-brimmed hat, and sunglasses.  What should I know about osteoporosis? Osteoporosis is a condition in which bone destruction happens more quickly than new bone creation. After menopause, you may be at an increased risk for osteoporosis. To help prevent osteoporosis or the bone fractures that can happen because of osteoporosis, the following is recommended:  If you are 46-71 years old, get at least 1,000 mg of calcium and at least 600 mg of vitamin D per day.  If you are older than age 55 but younger than age 65, get at least 1,200 mg of calcium and at least 600 mg of vitamin D per day.  If you are older than age 54, get at least 1,200 mg of calcium and at least 800 mg of vitamin D per day.  Smoking and excessive alcohol intake increase the risk of osteoporosis. Eat foods that are rich in calcium and vitamin D, and do weight-bearing exercises several times each week as directed by your health care provider. What should I know about how menopause affects my mental health? Depression may occur at any age, but it is more common as you become older. Common symptoms of depression  include:  Low or sad mood.  Changes in sleep patterns.  Changes in appetite or eating patterns.  Feeling an overall lack of motivation or enjoyment of activities that you previously enjoyed.  Frequent crying spells.  Talk with your health care provider if you think that you are experiencing depression. What should I know about immunizations? It is important that you get and maintain your immunizations. These include:  Tetanus, diphtheria, and pertussis (Tdap) booster vaccine.  Influenza every year before the flu season begins.  Pneumonia vaccine.  Shingles vaccine.  Your health care provider may also recommend other immunizations. This information is not intended to replace advice given to you by your health care provider. Make sure you discuss any questions you have with your health care provider. Document Released: 03/11/2005  Document Revised: 08/07/2015 Document Reviewed: 10/21/2014 Elsevier Interactive Patient Education  2018 [employer]  

## 2017-07-27 ENCOUNTER — Encounter: Payer: Self-pay | Admitting: Internal Medicine

## 2017-07-27 ENCOUNTER — Other Ambulatory Visit: Payer: Self-pay | Admitting: Internal Medicine

## 2017-07-27 DIAGNOSIS — E039 Hypothyroidism, unspecified: Secondary | ICD-10-CM

## 2017-07-28 MED ORDER — LEVOTHYROXINE SODIUM 75 MCG PO TABS: 75.0000 ug | ORAL_TABLET | Freq: Every day | ORAL | 10 refills | Status: DC

## 2017-11-24 ENCOUNTER — Other Ambulatory Visit: Payer: Self-pay | Admitting: Internal Medicine

## 2018-02-01 IMAGING — DX DG CERVICAL SPINE COMPLETE 4+V
6 series · 7 of 7 positions shown · non-contrast
Comparison: 03/28/2011

CLINICAL DATA: Neck pain for 5 weeks

EXAM:
CERVICAL SPINE - COMPLETE 4+ VIEW

[c-spine lat]
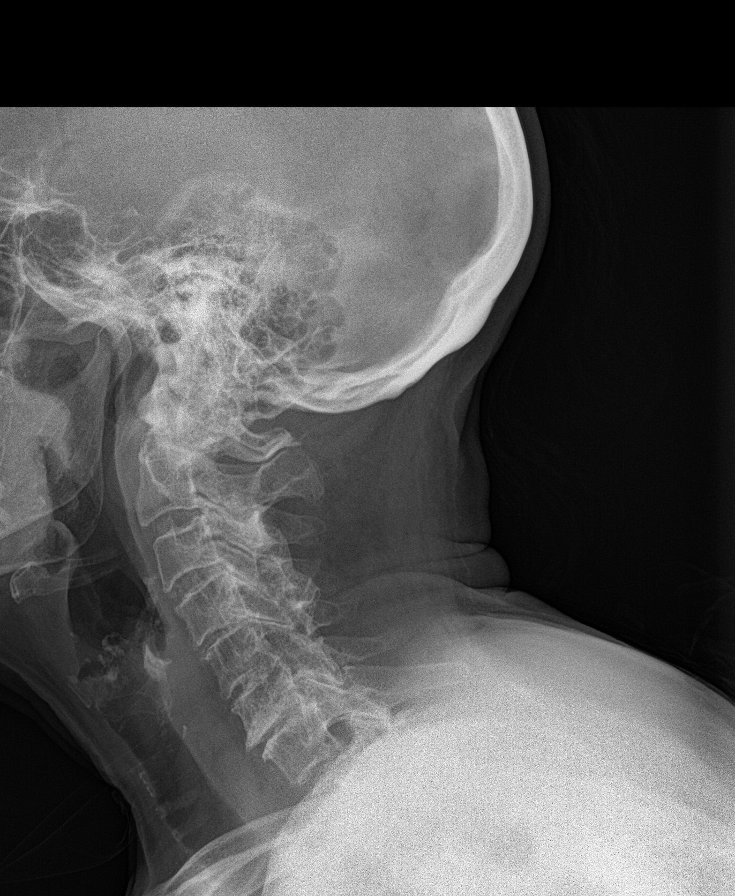

[c-spine obl (1 of 2)]
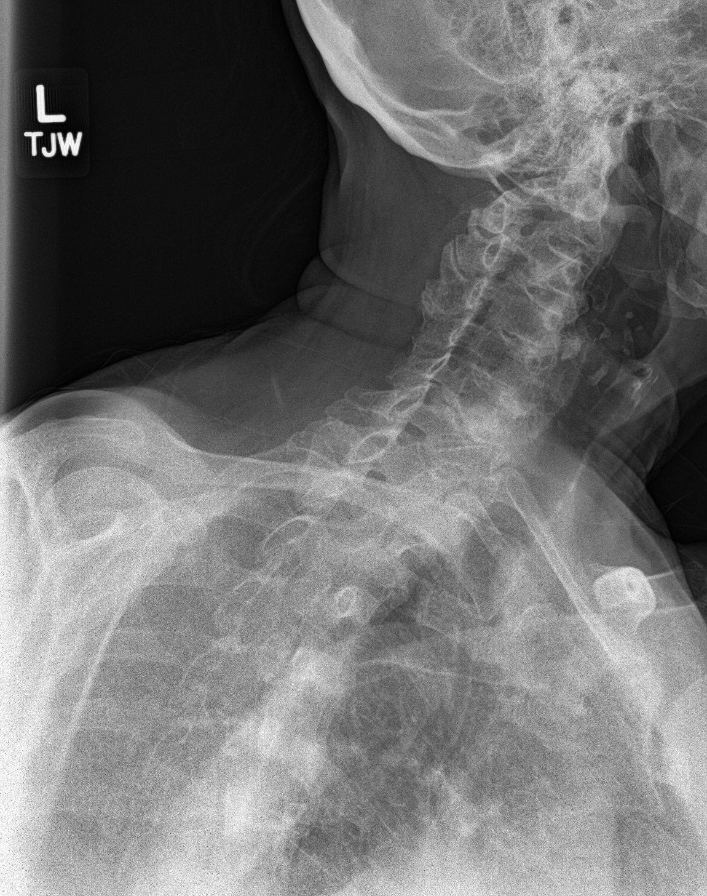

[c-spine obl (2 of 2)]
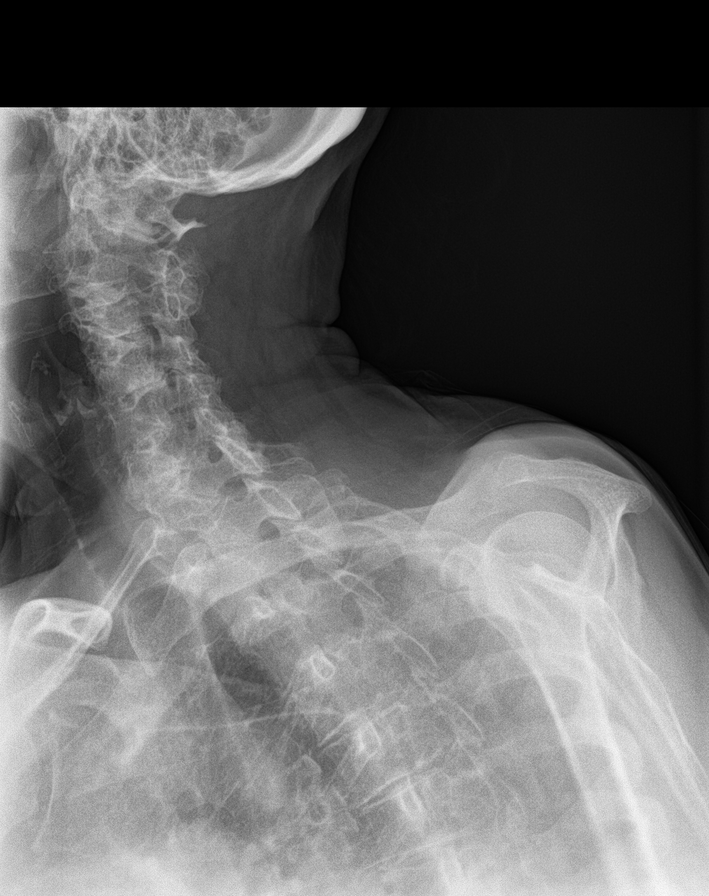

[c-spine ap]
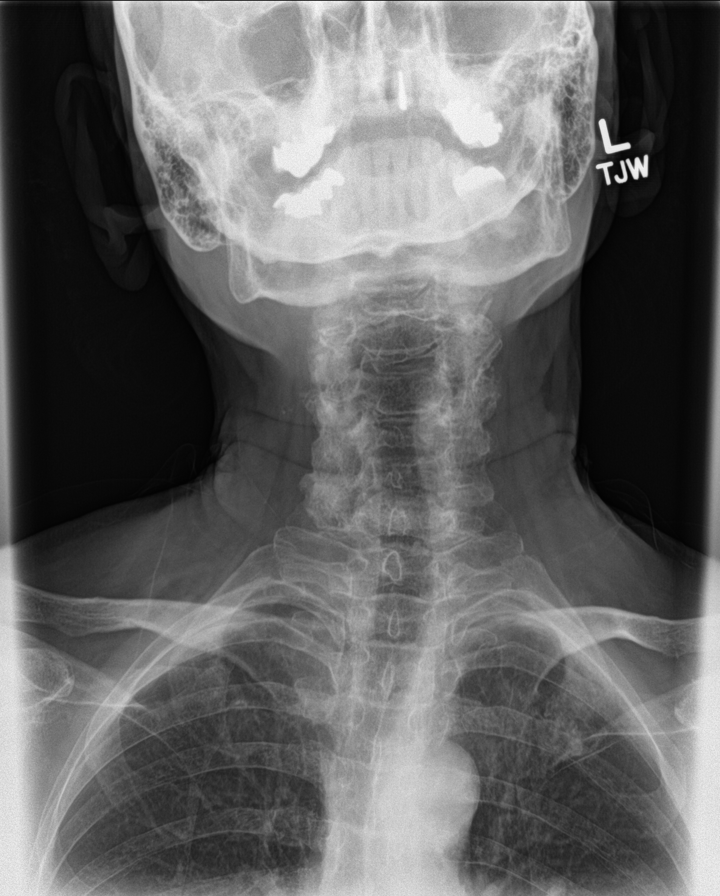

[Series 5: c-spine open mouth · 0.14mm/px · 2 of 2 slices shown]
[im 1/2]
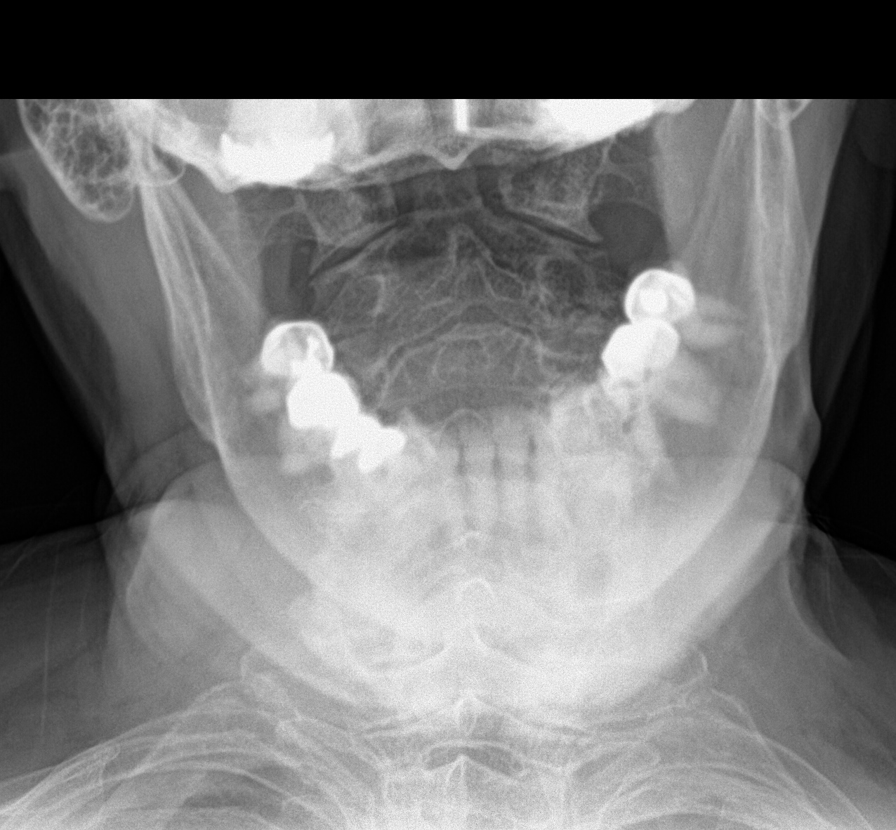
[im 2/2]
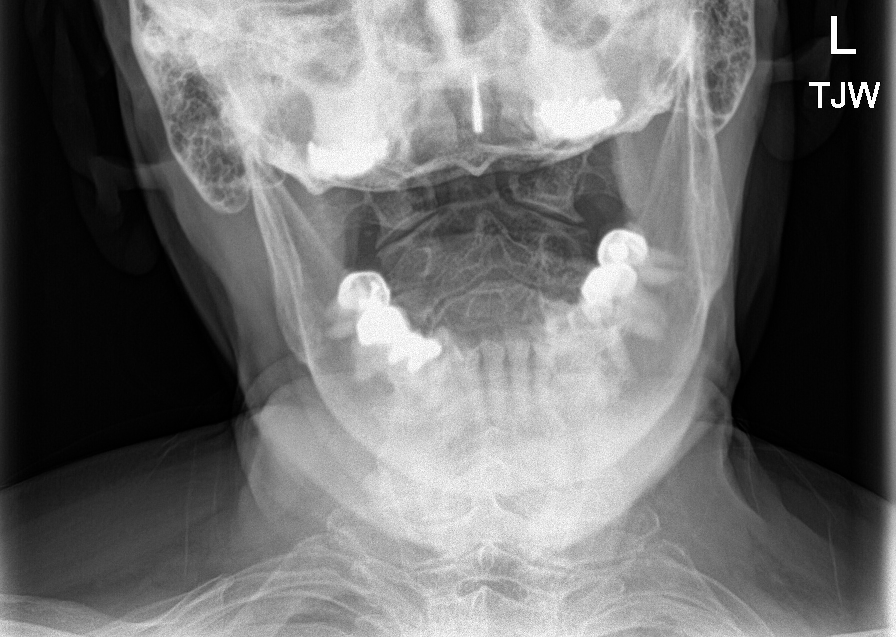

[c-spine swimmers]
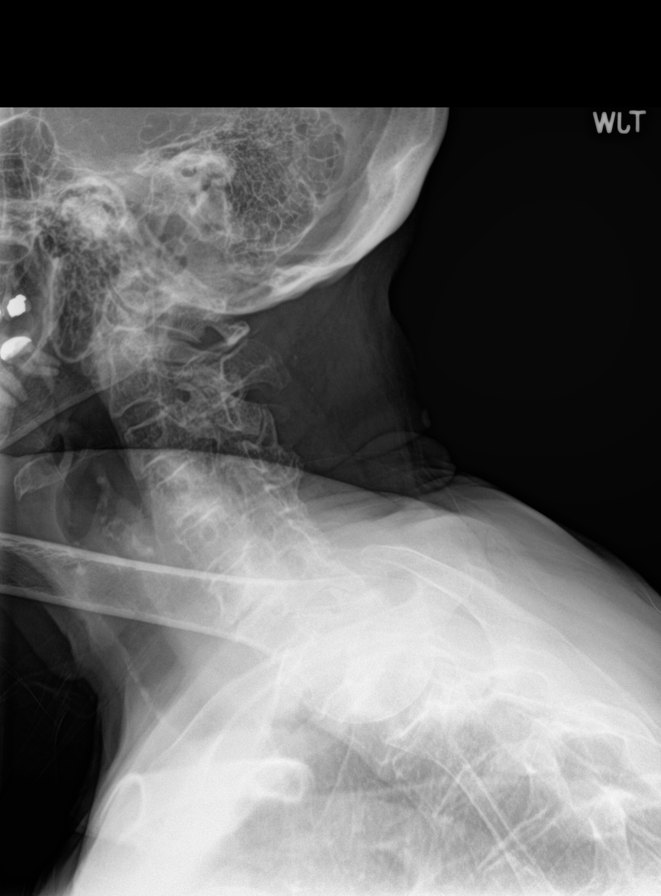

[7 of 7 positions shown; findings below may reference images not displayed]

FINDINGS: Straightening of the cervical spine. Moderate degenerative changes
at C4-C5, C5-C6 and C6-C7, slight progression since prior
radiograph. Prevertebral soft tissue thickness appears within normal
limits. Multiple level bilateral foraminal narrowing. Lung apices
clear. Dens and lateral masses are within normal limits
IMPRESSION: Moderate to marked arthritis of the cervical spine, most notable
from C4 through C7. No acute osseous abnormality.

## 2018-02-09 ENCOUNTER — Encounter: Payer: Self-pay | Admitting: Family Medicine

## 2018-02-09 ENCOUNTER — Ambulatory Visit (INDEPENDENT_AMBULATORY_CARE_PROVIDER_SITE_OTHER): Payer: PPO | Admitting: Family Medicine

## 2018-02-09 VITALS — BP 144/82 | HR 63 | Temp 97.8°F | Wt 149.5 lb

## 2018-02-09 DIAGNOSIS — M26609 Unspecified temporomandibular joint disorder, unspecified side: Secondary | ICD-10-CM

## 2018-02-09 DIAGNOSIS — B9789 Other viral agents as the cause of diseases classified elsewhere: Secondary | ICD-10-CM | POA: Diagnosis not present

## 2018-02-09 DIAGNOSIS — J069 Acute upper respiratory infection, unspecified: Secondary | ICD-10-CM | POA: Diagnosis not present

## 2018-02-09 MED ORDER — BENZONATATE 100 MG PO CAPS: 100.0000 mg | ORAL_CAPSULE | Freq: Three times a day (TID) | ORAL | 0 refills | Status: DC | PRN

## 2018-02-09 NOTE — Progress Notes (Signed)
Subjective:    Patient ID: Amy Lin, female    DOB: January 04, 1934, 85 y.o.   MRN: [id number]  HPI This is an 85 yo female who presents today with cough and chest congestion. Started a week ago with chills, progressed to sore throat, nasal congestion, intermittent right ear pain. Occasional yellow phlegm. Nasal drainage yellow in am, clears as day progresses. Headaches over eyes. Felt SOB yesterday, no wheeze. Cough disrupting sleep.  Has taken Advil cold and sinus with some relief at first.  Has had right jaw pain for about a year. Saw dentist about this. He offered $800 night guard. She does clinch her jaw during the day and has been trying to relax her jaw when she catches herself doing this. She has pain with chewing and decreased ROM. Her son bought her an over the counter mouth guard, but she can not open her mouth wide enough to place the guard. Does not have pain unless she is moving/chewing jaw.    Past Medical History:  Diagnosis Date  . Allergy   . GERD (gastroesophageal reflux disease)   . Hyperlipidemia   . Hypertension    diuretic for edema at first  . Hypothyroidism   . Osteoarthritis, multiple sites   . Osteoporosis    Past Surgical History:  Procedure Laterality Date  . ABDOMINAL HYSTERECTOMY    . BACK SURGERY  1999   Discectomy and Ray cage  . BLADDER REPAIR  ~2010  . CARPAL TUNNEL RELEASE  8/15  . COLONOSCOPY W/ BIOPSIES N/A 2016  . MENISCECTOMY Left 2/15   Dr Veverly Fells  . UPPER GI ENDOSCOPY N/A Feb 2017   Family History  Problem Relation Age of Onset  . Heart disease Mother   . Stroke Father   . Hypertension Father   . Heart disease Father   . Diabetes Father    Social History   Tobacco Use  . Smoking status: Never Smoker  . Smokeless tobacco: Never Used  Substance Use Topics  . Alcohol use: No  . Drug use: No      Review of Systems Per HPI    Objective:   Physical Exam Vitals signs reviewed.  Constitutional:      General: She is not in  acute distress.    Appearance: Normal appearance. She is normal weight. She is not ill-appearing, toxic-appearing or diaphoretic.  HENT:     Head: Normocephalic and atraumatic.     Jaw: Pain on movement present. No trismus, tenderness, swelling or malocclusion.     Comments: Clicking with opening/closing jaw.     Right Ear: Ear canal and external ear normal.     Left Ear: Tympanic membrane, ear canal and external ear normal.     Ears:     Comments: Right TM dull.     Nose: Nose normal.     Mouth/Throat:     Mouth: Mucous membranes are moist.     Dentition: Normal dentition.     Pharynx: Oropharynx is clear.  Eyes:     Conjunctiva/sclera: Conjunctivae normal.  Neck:     Musculoskeletal: Normal range of motion and neck supple. No neck rigidity.  Cardiovascular:     Rate and Rhythm: Normal rate and regular rhythm.     Heart sounds: Normal heart sounds.  Pulmonary:     Effort: Pulmonary effort is normal.     Breath sounds: Normal breath sounds.  Lymphadenopathy:     Cervical: No cervical adenopathy.  Skin:  General: Skin is warm and dry.  Neurological:     Mental Status: She is alert and oriented to person, place, and time.  Psychiatric:        Mood and Affect: Mood normal.        Behavior: Behavior normal.        Thought Content: Thought content normal.        Judgment: Judgment normal.       BP (!) 144/82   Pulse 63   Temp 97.8 F (36.6 C) (Oral)   Wt 149 lb 8 oz (67.8 kg)   SpO2 98%   BMI 25.66 kg/m  Wt Readings from Last 3 Encounters:  02/09/18 149 lb 8 oz (67.8 kg)  07/20/17 151 lb (68.5 kg)  01/27/17 155 lb (70.3 kg)   BP Readings from Last 3 Encounters:  02/09/18 (!) 144/82  07/20/17 134/80  06/22/17 (!) 156/70       Assessment & Plan:  1. Viral URI with cough - Provided written and verbal information regarding diagnosis and treatment. -  Patient Instructions  Good to see you today  I think you have a cold virus  You can continue to take  Advil Cold and Sinus for your symptoms, I have sent in a prescription cough pill.  An alternative to the prescription would be over the counter Delsym.   Drink enough liquids to make your urine light yellow.   If you are not better in 5-7 days or if you develop fever over 100, wheeze or shortness or breath please follow up   - benzonatate (TESSALON) 100 MG capsule; Take 1-2 capsules (100-200 mg total) by mouth 3 (three) times daily as needed for cough.  Dispense: 30 capsule; Refill: 0  2. TMJ (temporomandibular joint syndrome) - encouraged her to continue to decrease daytime clenching, work on relaxation, awareness - discussed using acetaminophen several times a day, doing gentle ROM, trying to get mouth open far enough to use over the counter night guard - follow up with PCP if no improvement   Clarene Reamer, FNP-BC  [facility], [place]  02/09/2018 8:43 AM

## 2018-02-09 NOTE — Patient Instructions (Signed)
Good to see you today  I think you have a cold virus  You can continue to take Advil Cold and Sinus for your symptoms, I have sent in a prescription cough pill.  An alternative to the prescription would be over the counter Delsym.   Drink enough liquids to make your urine light yellow.   If you are not better in 5-7 days or if you develop fever over 100, wheeze or shortness or breath please follow up

## 2018-07-16 ENCOUNTER — Ambulatory Visit (INDEPENDENT_AMBULATORY_CARE_PROVIDER_SITE_OTHER)
Admission: RE | Admit: 2018-07-16 | Discharge: 2018-07-16 | Disposition: A | Discharge status: Home / Self Care | Payer: PPO | Source: Ambulatory Visit | Attending: Internal Medicine | Admitting: Internal Medicine

## 2018-07-16 ENCOUNTER — Ambulatory Visit (INDEPENDENT_AMBULATORY_CARE_PROVIDER_SITE_OTHER): Payer: PPO | Admitting: Internal Medicine

## 2018-07-16 ENCOUNTER — Encounter: Payer: Self-pay | Admitting: Internal Medicine

## 2018-07-16 ENCOUNTER — Other Ambulatory Visit: Payer: Self-pay

## 2018-07-16 VITALS — BP 146/84 | HR 76 | Temp 98.0°F | Wt 147.0 lb

## 2018-07-16 DIAGNOSIS — I10 Essential (primary) hypertension: Secondary | ICD-10-CM

## 2018-07-16 DIAGNOSIS — M25551 Pain in right hip: Secondary | ICD-10-CM

## 2018-07-16 DIAGNOSIS — R42 Dizziness and giddiness: Secondary | ICD-10-CM | POA: Diagnosis not present

## 2018-07-16 DIAGNOSIS — M1611 Unilateral primary osteoarthritis, right hip: Secondary | ICD-10-CM | POA: Diagnosis not present

## 2018-07-16 LAB — CBC
HCT: 41.5 % (ref 36.0–46.0)
Hemoglobin: 14 g/dL (ref 12.0–15.0)
MCHC: 33.8 g/dL (ref 30.0–36.0)
MCV: 92.3 fl (ref 78.0–100.0)
Platelets: 198 10*3/uL (ref 150.0–400.0)
RBC: 4.5 Mil/uL (ref 3.87–5.11)
RDW: 13.4 % (ref 11.5–15.5)
WBC: 6.3 10*3/uL (ref 4.0–10.5)

## 2018-07-16 LAB — COMPREHENSIVE METABOLIC PANEL
ALT: 9 U/L (ref 0–35)
AST: 13 U/L (ref 0–37)
Albumin: 4.1 g/dL (ref 3.5–5.2)
Alkaline Phosphatase: 58 U/L (ref 39–117)
BUN: 19 mg/dL (ref 6–23)
CO2: 30 mEq/L (ref 19–32)
Calcium: 9.6 mg/dL (ref 8.4–10.5)
Chloride: 102 mEq/L (ref 96–112)
Creatinine, Ser: 0.85 mg/dL (ref 0.40–1.20)
GFR: 63.96 mL/min (ref >60.00)
Glucose, Bld: 86 mg/dL (ref 70–99)
Potassium: 4.3 mEq/L (ref 3.5–5.1)
Sodium: 140 mEq/L (ref 135–145)
Total Bilirubin: 0.5 mg/dL (ref 0.2–1.2)
Total Protein: 6.9 g/dL (ref 6.0–8.3)

## 2018-07-16 LAB — T4, FREE: Free T4: 0.97 ng/dL (ref 0.60–1.60)

## 2018-07-16 LAB — TSH: TSH: 1.88 u[IU]/mL (ref 0.35–4.50)

## 2018-07-16 MED ORDER — PREDNISONE 10 MG PO TABS: ORAL_TABLET | ORAL | 0 refills | Status: DC

## 2018-07-16 NOTE — Progress Notes (Signed)
Subjective:    Patient ID: Amy Lin, female    DOB: 07-Nov-1933, 85 y.o.   MRN: [id number]  HPI  Pt presents to the clinic today with c/o elevated blood pressure and dizziness. She reports she felt dizzy after leaving church yesterday. She denies headaches, visual changes or chest pain. She has had some mild shortness of breath but this has been an ongoing issue, not related to the blood pressure or dizziness. She checked her BP when she got home, it was 199/80. She took Furosemide x 1 and rechecked her BP, it was 183/80. She rechecked it before bed and it was 150/80. She has been having some pain and decreased ROM in her right hip. She describes the pain as achy. The pain is worse with ambulation. She denies numbness, tingling or weakness. She has not taken any medication OTC for this. She denies any injury to the area. Her BP today is 146/84.  Review of Systems      Past Medical History:  Diagnosis Date  . Allergy   . GERD (gastroesophageal reflux disease)   . Hyperlipidemia   . Hypertension    diuretic for edema at first  . Hypothyroidism   . Osteoarthritis, multiple sites   . Osteoporosis     Current Outpatient Medications  Medication Sig Dispense Refill  . aspirin 81 MG EC tablet Take 81 mg by mouth daily.      . cetirizine (ZYRTEC) 10 MG tablet Take 10 mg by mouth daily.      . Cholecalciferol (VITAMIN D3) 2000 UNITS TABS Take by mouth.      . Cyanocobalamin (VITAMIN B 12 PO) Take by mouth daily.    . furosemide (LASIX) 20 MG tablet TAKE 1/2 TABLET BY MOUTH ONCE A DAY 30 tablet 5  . hydrocortisone 2.5 % lotion Apply topically 2 (two) times daily. 59 mL 0  . levothyroxine (SYNTHROID, LEVOTHROID) 75 MCG tablet Take 1 tablet (75 mcg total) by mouth daily before breakfast. 30 tablet 10  . OVER THE COUNTER MEDICATION Take 1 capsule by mouth daily. Probiotic    . pantoprazole (PROTONIX) 20 MG tablet     . rosuvastatin (CRESTOR) 20 MG tablet TAKE 1 TABLET BY MOUTH ONCE DAILY  90 tablet 1   No current facility-administered medications for this visit.     No Known Allergies  Family History  Problem Relation Age of Onset  . Heart disease Mother   . Stroke Father   . Hypertension Father   . Heart disease Father   . Diabetes Father     Social History   Socioeconomic History  . Marital status: Divorced    Spouse name: Not on file  . Number of children: 3  . Years of education: Not on file  . Highest education level: Not on file  Occupational History  . Occupation: Audiological scientist    Comment: [employer]  . Financial resource strain: Not on file  . Food insecurity    Worry: Not on file    Inability: Not on file  . Transportation needs    Medical: Not on file    Non-medical: Not on file  Tobacco Use  . Smoking status: Never Smoker  . Smokeless tobacco: Never Used  Substance and Sexual Activity  . Alcohol use: No  . Drug use: No  . Sexual activity: Never  Lifestyle  . Physical activity    Days per week: Not on file    Minutes per  session: Not on file  . Stress: Not on file  Relationships  . Social Herbalist on phone: Not on file    Gets together: Not on file    Attends religious service: Not on file    Active member of club or organization: Not on file    Attends meetings of clubs or organizations: Not on file    Relationship status: Not on file  . Intimate partner violence    Fear of current or ex partner: Not on file    Emotionally abused: Not on file    Physically abused: Not on file    Forced sexual activity: Not on file  Other Topics Concern  . Not on file  Social History Narrative   3 children--- 1 died   Lives alone      No living will   Requests son and daughter as health care POAs   Would accept resuscitation attempts but no prolonged artificial ventilation   Not sure about tube feeds     Constitutional: Denies fever, malaise, fatigue, headache or abrupt weight changes.  HEENT: Denies eye  pain, eye redness, ear pain, ringing in the ears, wax buildup, runny nose, nasal congestion, bloody nose, or sore throat. Respiratory: Pt reports shortness of breath. Denies difficulty breathing, cough or sputum production.   Cardiovascular: Denies chest pain, chest tightness, palpitations or swelling in the hands or feet.  Gastrointestinal: Denies abdominal pain, bloating, constipation, diarrhea or blood in the stool.  GU: Denies urgency, frequency, pain with urination, burning sensation, blood in urine, odor or discharge. Musculoskeletal: Pt reports right hip pain, decrease in ROM. Denies difficulty with gait, muscle pain or joint swelling.  Neurological: Pt reports dizziness. Denies difficulty with memory, difficulty with speech or problems with balance and coordination.    No other specific complaints in a complete review of systems (except as listed in HPI above).  Objective:   Physical Exam   BP (!) 146/84 (BP Location: Right Arm, Patient Position: Sitting, Cuff Size: Normal)   Pulse 76   Temp 98 F (36.7 C) (Oral)   Wt 147 lb (66.7 kg)   SpO2 98%   BMI 25.23 kg/m  Wt Readings from Last 3 Encounters:  07/16/18 147 lb (66.7 kg)  02/09/18 149 lb 8 oz (67.8 kg)  07/20/17 151 lb (68.5 kg)    General: Appears her stated age, well developed, well nourished in NAD. Skin: Warm, dry and intact. No rashes noted. HEENT: Head: normal shape and size; Eyes: sclera white, no icterus, conjunctiva pink, PERRLA and EOMs intact; Ears: Tm's gray and intact, normal light reflex;  Neck:  Neck supple, trachea midline. No masses, lumps present.  Cardiovascular: Normal rate and rhythm. S1,S2 noted.  No murmur, rubs or gallops noted.  Pulmonary/Chest: Normal effort and positive vesicular breath sounds. No respiratory distress. No wheezes, rales or ronchi noted.  Musculoskeletal: Normal flexion, extension and rotation of the spine. No bony tenderness noted over the spine. Normal abduction, adduction,  flexion and extension of the right hip. Pain with internal and external rotation of the right hip. Pain with palpation over the right trochanteric bursa. Strength 5/5 BLE. She has trouble moving from a sitting to a standing position. She is able to walk on tip toes and heels. Gait slow but steady once she gets moving. Neurological: Alert and oriented.    BMET    Component Value Date/Time   NA 142 07/20/2017 1004   K 4.0 07/20/2017 1004  CL 105 07/20/2017 1004   CO2 29 07/20/2017 1004   GLUCOSE 97 07/20/2017 1004   BUN 17 07/20/2017 1004   CREATININE 0.88 07/20/2017 1004   CREATININE 1.11 (H) 07/15/2016 1659   CALCIUM 9.7 07/20/2017 1004   GFRNONAA 68.59 12/04/2009 1131   GFRAA 91 03/12/2008 0928    Lipid Panel     Component Value Date/Time   CHOL 251 (H) 07/20/2017 1004   TRIG 110.0 07/20/2017 1004   TRIG 103 12/06/2005 1055   HDL 50.80 07/20/2017 1004   CHOLHDL 5 07/20/2017 1004   VLDL 22.0 07/20/2017 1004   LDLCALC 178 (H) 07/20/2017 1004    CBC    Component Value Date/Time   WBC 6.5 07/20/2017 1004   RBC 4.43 07/20/2017 1004   HGB 13.9 07/20/2017 1004   HCT 40.6 07/20/2017 1004   PLT 197.0 07/20/2017 1004   MCV 91.6 07/20/2017 1004   MCH 30.4 07/15/2016 1659   MCHC 34.2 07/20/2017 1004   RDW 13.3 07/20/2017 1004   LYMPHSABS 1,007 07/15/2016 1659   MONOABS 848 07/15/2016 1659   EOSABS 53 07/15/2016 1659   BASOSABS 53 07/15/2016 1659    Hgb A1C Lab Results  Component Value Date   HGBA1C 6.2 11/11/2015           Assessment & Plan:   HTN, Dizziness, Right Hip Pain:  Indication for ECG: HTN, dizziness Interpretation of ECG: normal rate and rhythm. No acute findings Comparison of ECG: 12/2015, unchanged Will check CBC, CMET, TSH and Free T4 Xray right hip today RX for Pred Taper x 9 days Hip exercises given If BP remains high despite improvement in pain, consider adding low dose ACEI/ARB or Amlodipine Home BP compared with manual reading, reading  about 20 points higher (systolic)  Will follow up after labs and xray, return precautions discussed Webb Silversmith, NP

## 2018-07-16 NOTE — Patient Instructions (Signed)

## 2018-07-18 ENCOUNTER — Encounter: Payer: Self-pay | Admitting: Internal Medicine

## 2018-07-20 ENCOUNTER — Encounter: Payer: Self-pay | Admitting: Internal Medicine

## 2018-07-20 ENCOUNTER — Other Ambulatory Visit: Payer: Self-pay | Admitting: Internal Medicine

## 2018-07-20 DIAGNOSIS — E039 Hypothyroidism, unspecified: Secondary | ICD-10-CM

## 2018-07-25 ENCOUNTER — Encounter: Payer: Self-pay | Admitting: Internal Medicine

## 2018-08-31 ENCOUNTER — Other Ambulatory Visit: Payer: Self-pay

## 2018-09-06 ENCOUNTER — Other Ambulatory Visit: Payer: Self-pay | Admitting: Internal Medicine

## 2018-09-06 ENCOUNTER — Telehealth: Payer: Self-pay | Admitting: Internal Medicine

## 2018-09-06 ENCOUNTER — Encounter: Payer: Self-pay | Admitting: Internal Medicine

## 2018-09-06 DIAGNOSIS — Z1231 Encounter for screening mammogram for malignant neoplasm of breast: Secondary | ICD-10-CM

## 2018-09-06 NOTE — Telephone Encounter (Signed)
Patient stated that she called to schedule her mammogram @ [facility] advised her that they are needing an order sent to them before they could schedule.    Patient would like a call back once this has been ordered  Patient's C/B # [phone]

## 2018-09-06 NOTE — Telephone Encounter (Signed)
She is past due for her AWV. She needs to schedule this and order will be placed at that visit.

## 2018-09-10 ENCOUNTER — Encounter: Payer: Self-pay | Admitting: Internal Medicine

## 2018-09-10 ENCOUNTER — Other Ambulatory Visit: Payer: Self-pay

## 2018-09-10 ENCOUNTER — Ambulatory Visit (INDEPENDENT_AMBULATORY_CARE_PROVIDER_SITE_OTHER): Payer: PPO | Admitting: Internal Medicine

## 2018-09-10 VITALS — BP 152/86 | HR 84 | Temp 97.5°F | Ht 64.0 in | Wt 147.5 lb

## 2018-09-10 DIAGNOSIS — N6459 Other signs and symptoms in breast: Secondary | ICD-10-CM

## 2018-09-10 NOTE — Patient Instructions (Signed)
Mammogram A mammogram is an X-ray of the breasts that is done to check for changes that are not normal. This test can screen for and find any changes that may suggest breast cancer. Mammograms are regularly done on women. A man may have a mammogram if he has a lump or swelling in his breast. This test can also help to find other changes and variations in the breast. Tell a doctor:  About any allergies you have.  If you have breast implants.  If you have had previous breast disease, biopsy, or surgery.  If you are breastfeeding.  If you are younger than age 57.  If you have a family history of breast cancer.  Whether you are pregnant or may be pregnant. What are the risks? Generally, this is a safe procedure. However, problems may occur, including:  Exposure to radiation. Radiation levels are very low with this test.  The results being misinterpreted.  The need for further tests.  The inability of the mammogram to detect certain cancers. What happens before the procedure?  Have this test done about 1-2 weeks after your period. This is usually when your breasts are the least tender.  If you are visiting a new doctor or clinic, send any past mammogram images to your new doctor's office.  Wash your breasts and under your arms the day of the test.  Do not use deodorants, perfumes, lotions, or powders on the day of the test.  Take off any jewelry from your neck.  Wear clothes that you can change into and out of easily. What happens during the procedure?   You will undress from the waist up. You will put on a gown.  You will stand in front of the X-ray machine.  Each breast will be placed between two plastic or glass plates. The plates will press down on your breast for a few seconds. Try to stay as relaxed as possible. This does not cause any harm to your breasts. Any discomfort you feel will be very brief.  X-rays will be taken from different angles of each breast. The  procedure may vary among doctors and hospitals. What happens after the procedure?  The mammogram will be read by a specialist (radiologist).  You may need to do certain parts of the test again. This depends on the quality of the images.  Ask when your test results will be ready. Make sure you get your test results.  You may go back to your normal activities. Summary  A mammogram is a low energy X-ray of the breasts that is done to check for abnormal changes. A man may have this test if he has a lump or swelling in his breast.  Before the procedure, tell your doctor about any breast problems that you have had in the past.  Have this test done about 1-2 weeks after your period.  For the test, each breast will be placed between two plastic or glass plates. The plates will press down on your breast for a few seconds.  The mammogram will be read by a specialist (radiologist). Ask when your test results will be ready. Make sure you get your test results. This information is not intended to replace advice given to you by your health care provider. Make sure you discuss any questions you have with your health care provider. Document Released: 04/15/2008 Document Revised: 09/07/2017 Document Reviewed: 09/07/2017 Elsevier Patient Education  2020 [employer].

## 2018-09-10 NOTE — Progress Notes (Signed)
Subjective:    Patient ID: Amy Lin, female    DOB: 05/19/1933, 85 y.o.   MRN: [id number]  HPI  Pt presents to the clinic today with reports inverted nipple of left breast with some mild itching. She noticed this last Wednesday. She has not noticed any changes in the skin of the breast or discharge from the nipple. She has not felt any masses. Her last mammogram was 01/2016.  Review of Systems  Past Medical History:  Diagnosis Date  . Allergy   . GERD (gastroesophageal reflux disease)   . Hyperlipidemia   . Hypertension    diuretic for edema at first  . Hypothyroidism   . Osteoarthritis, multiple sites   . Osteoporosis     Current Outpatient Medications  Medication Sig Dispense Refill  . aspirin 81 MG EC tablet Take 81 mg by mouth daily.      . cetirizine (ZYRTEC) 10 MG tablet Take 10 mg by mouth daily.      . Cholecalciferol (VITAMIN D3) 2000 UNITS TABS Take by mouth.      . Cyanocobalamin (VITAMIN B 12 PO) Take by mouth daily.    . furosemide (LASIX) 20 MG tablet TAKE 1/2 TABLET BY MOUTH ONCE A DAY 30 tablet 5  . hydrocortisone 2.5 % lotion Apply topically 2 (two) times daily. 59 mL 0  . levothyroxine (SYNTHROID) 75 MCG tablet TAKE 1 TABLET BY MOUTH ONCE A DAY BEFOREBREAKFAST. 30 tablet 2  . OVER THE COUNTER MEDICATION Take 1 capsule by mouth daily. Probiotic    . pantoprazole (PROTONIX) 20 MG tablet     . predniSONE (DELTASONE) 10 MG tablet Take 3 tabs on days 1-3, take 2 tabs on days 4-6, take 1 tab on days 7-9 18 tablet 0  . rosuvastatin (CRESTOR) 20 MG tablet TAKE 1 TABLET BY MOUTH ONCE DAILY 90 tablet 1   No current facility-administered medications for this visit.     No Known Allergies  Family History  Problem Relation Age of Onset  . Heart disease Mother   . Stroke Father   . Hypertension Father   . Heart disease Father   . Diabetes Father     Social History   Socioeconomic History  . Marital status: Divorced    Spouse name: Not on file  .  Number of children: 3  . Years of education: Not on file  . Highest education level: Not on file  Occupational History  . Occupation: Audiological scientist    Comment: [employer]  . Financial resource strain: Not on file  . Food insecurity    Worry: Not on file    Inability: Not on file  . Transportation needs    Medical: Not on file    Non-medical: Not on file  Tobacco Use  . Smoking status: Never Smoker  . Smokeless tobacco: Never Used  Substance and Sexual Activity  . Alcohol use: No  . Drug use: No  . Sexual activity: Never  Lifestyle  . Physical activity    Days per week: Not on file    Minutes per session: Not on file  . Stress: Not on file  Relationships  . Social Herbalist on phone: Not on file    Gets together: Not on file    Attends religious service: Not on file    Active member of club or organization: Not on file    Attends meetings of clubs or organizations: Not on file  Relationship status: Not on file  . Intimate partner violence    Fear of current or ex partner: Not on file    Emotionally abused: Not on file    Physically abused: Not on file    Forced sexual activity: Not on file  Other Topics Concern  . Not on file  Social History Narrative   3 children--- 1 died   Lives alone      No living will   Requests son and daughter as health care POAs   Would accept resuscitation attempts but no prolonged artificial ventilation   Not sure about tube feeds     Constitutional: Denies fever, malaise, fatigue, headache or abrupt weight changes.  Respiratory: Denies difficulty breathing, shortness of breath, cough or sputum production.   Cardiovascular: Denies chest pain, chest tightness, palpitations or swelling in the hands or feet.  es decrease in range of motion, difficulty with gait, muscle pain or joint pain and swelling.  Skin: Pt reports inverted, itching nipple. Denies redness, rashes, lesions or ulcercations.   No other  specific complaints in a complete review of systems (except as listed in HPI above).     Objective:   Physical Exam  BP (!) 152/86   Pulse 84   Temp (!) 97.5 F (36.4 C) (Temporal)   Ht 5\' 4"  (1.626 m)   Wt 147 lb 8 oz (66.9 kg)   SpO2 97%   BMI 25.32 kg/m  Wt Readings from Last 3 Encounters:  09/10/18 147 lb 8 oz (66.9 kg)  07/16/18 147 lb (66.7 kg)  02/09/18 149 lb 8 oz (67.8 kg)    General: Appears her stated age, well developed, well nourished in NAD. Skin: Breast symmetrical. Left breast without mass or lump. Left breast with inverted nipple. No discharge expressed from the nipple.  Cardiovascular: Normal rate and rhythm.  Pulmonary/Chest: Normal effort and positive vesicular breath sounds. No respiratory distress. No wheezes, rales or ronchi noted.  Neurological: Alert and oriented.    BMET    Component Value Date/Time   NA 140 07/16/2018 1246   K 4.3 07/16/2018 1246   CL 102 07/16/2018 1246   CO2 30 07/16/2018 1246   GLUCOSE 86 07/16/2018 1246   BUN 19 07/16/2018 1246   CREATININE 0.85 07/16/2018 1246   CREATININE 1.11 (H) 07/15/2016 1659   CALCIUM 9.6 07/16/2018 1246   GFRNONAA 68.59 12/04/2009 1131   GFRAA 91 03/12/2008 0928    Lipid Panel     Component Value Date/Time   CHOL 251 (H) 07/20/2017 1004   TRIG 110.0 07/20/2017 1004   TRIG 103 12/06/2005 1055   HDL 50.80 07/20/2017 1004   CHOLHDL 5 07/20/2017 1004   VLDL 22.0 07/20/2017 1004   LDLCALC 178 (H) 07/20/2017 1004    CBC    Component Value Date/Time   WBC 6.3 07/16/2018 1246   RBC 4.50 07/16/2018 1246   HGB 14.0 07/16/2018 1246   HCT 41.5 07/16/2018 1246   PLT 198.0 07/16/2018 1246   MCV 92.3 07/16/2018 1246   MCH 30.4 07/15/2016 1659   MCHC 33.8 07/16/2018 1246   RDW 13.4 07/16/2018 1246   LYMPHSABS 1,007 07/15/2016 1659   MONOABS 848 07/15/2016 1659   EOSABS 53 07/15/2016 1659   BASOSABS 53 07/15/2016 1659    Hgb A1C Lab Results  Component Value Date   HGBA1C 6.2 11/11/2015             Assessment & Plan:   Inverted Nipple, Left Breast:  Will  obtain diagnostic 3D mammogram of bilateral breast Will obtain ultrasound of left breast  Will follow up after imaging, return precautions discussed Webb Silversmith, NP

## 2018-09-11 ENCOUNTER — Encounter: Payer: Self-pay | Admitting: Internal Medicine

## 2018-09-13 ENCOUNTER — Other Ambulatory Visit: Payer: Self-pay

## 2018-09-13 ENCOUNTER — Encounter: Payer: Self-pay | Admitting: Internal Medicine

## 2018-09-13 ENCOUNTER — Ambulatory Visit (INDEPENDENT_AMBULATORY_CARE_PROVIDER_SITE_OTHER): Payer: PPO | Admitting: Internal Medicine

## 2018-09-13 ENCOUNTER — Ambulatory Visit (INDEPENDENT_AMBULATORY_CARE_PROVIDER_SITE_OTHER)
Admission: RE | Admit: 2018-09-13 | Discharge: 2018-09-13 | Disposition: A | Discharge status: Home / Self Care | Payer: PPO | Source: Ambulatory Visit | Attending: Internal Medicine | Admitting: Internal Medicine

## 2018-09-13 VITALS — BP 142/88 | HR 63 | Temp 97.7°F | Ht 63.5 in | Wt 146.0 lb

## 2018-09-13 DIAGNOSIS — M25561 Pain in right knee: Secondary | ICD-10-CM

## 2018-09-13 DIAGNOSIS — M19071 Primary osteoarthritis, right ankle and foot: Secondary | ICD-10-CM

## 2018-09-13 DIAGNOSIS — M810 Age-related osteoporosis without current pathological fracture: Secondary | ICD-10-CM | POA: Diagnosis not present

## 2018-09-13 DIAGNOSIS — Z0000 Encounter for general adult medical examination without abnormal findings: Secondary | ICD-10-CM

## 2018-09-13 DIAGNOSIS — E7800 Pure hypercholesterolemia, unspecified: Secondary | ICD-10-CM | POA: Diagnosis not present

## 2018-09-13 DIAGNOSIS — K2100 Gastro-esophageal reflux disease with esophagitis, without bleeding: Secondary | ICD-10-CM

## 2018-09-13 DIAGNOSIS — M19072 Primary osteoarthritis, left ankle and foot: Secondary | ICD-10-CM | POA: Diagnosis not present

## 2018-09-13 DIAGNOSIS — G8929 Other chronic pain: Secondary | ICD-10-CM | POA: Diagnosis not present

## 2018-09-13 DIAGNOSIS — E039 Hypothyroidism, unspecified: Secondary | ICD-10-CM

## 2018-09-13 DIAGNOSIS — M8580 Other specified disorders of bone density and structure, unspecified site: Secondary | ICD-10-CM

## 2018-09-13 DIAGNOSIS — I10 Essential (primary) hypertension: Secondary | ICD-10-CM | POA: Diagnosis not present

## 2018-09-13 LAB — LIPID PANEL
Cholesterol: 304 mg/dL — ABNORMAL HIGH (ref 0–200)
HDL: 50.6 mg/dL (ref >39.00)
LDL Cholesterol: 237 mg/dL — ABNORMAL HIGH (ref 0–99)
NonHDL: 253.83
Total CHOL/HDL Ratio: 6
Triglycerides: 84 mg/dL (ref 0.0–149.0)
VLDL: 16.8 mg/dL (ref 0.0–40.0)

## 2018-09-13 LAB — CBC
HCT: 40.4 % (ref 36.0–46.0)
Hemoglobin: 13.7 g/dL (ref 12.0–15.0)
MCHC: 33.8 g/dL (ref 30.0–36.0)
MCV: 93.4 fl (ref 78.0–100.0)
Platelets: 205 10*3/uL (ref 150.0–400.0)
RBC: 4.33 Mil/uL (ref 3.87–5.11)
RDW: 13.6 % (ref 11.5–15.5)
WBC: 6.8 10*3/uL (ref 4.0–10.5)

## 2018-09-13 LAB — COMPREHENSIVE METABOLIC PANEL
ALT: 10 U/L (ref 0–35)
AST: 18 U/L (ref 0–37)
Albumin: 4.2 g/dL (ref 3.5–5.2)
Alkaline Phosphatase: 59 U/L (ref 39–117)
BUN: 19 mg/dL (ref 6–23)
CO2: 27 mEq/L (ref 19–32)
Calcium: 9.6 mg/dL (ref 8.4–10.5)
Chloride: 106 mEq/L (ref 96–112)
Creatinine, Ser: 0.9 mg/dL (ref 0.40–1.20)
GFR: 59.85 mL/min — ABNORMAL LOW (ref >60.00)
Glucose, Bld: 98 mg/dL (ref 70–99)
Potassium: 4 mEq/L (ref 3.5–5.1)
Sodium: 141 mEq/L (ref 135–145)
Total Bilirubin: 0.5 mg/dL (ref 0.2–1.2)
Total Protein: 7.3 g/dL (ref 6.0–8.3)

## 2018-09-13 LAB — VITAMIN D 25 HYDROXY (VIT D DEFICIENCY, FRACTURES): VITD: 45.4 ng/mL (ref 30.00–100.00)

## 2018-09-13 LAB — TSH: TSH: 1.3 u[IU]/mL (ref 0.35–4.50)

## 2018-09-13 LAB — T4, FREE: Free T4: 1.03 ng/dL (ref 0.60–1.60)

## 2018-09-13 NOTE — Assessment & Plan Note (Signed)
TSH and Free T4 today Will adjust dose of Levothyroxine if needed based on labs

## 2018-09-13 NOTE — Assessment & Plan Note (Signed)
Encouraged regular activity Start Aleve 1 tab PO BID

## 2018-09-13 NOTE — Assessment & Plan Note (Signed)
Slightly elevated but reasonable off meds CMET today Reinforced DASH diet

## 2018-09-13 NOTE — Patient Instructions (Signed)
Health Maintenance After Age 83 After age 83, you are at a higher risk for certain long-term diseases and infections as well as injuries from falls. Falls are a major cause of broken bones and head injuries in people who are older than age 83. Getting regular preventive care can help to keep you healthy and well. Preventive care includes getting regular testing and making lifestyle changes as recommended by your health care provider. Talk with your health care provider about:  Which screenings and tests you should have. A screening is a test that checks for a disease when you have no symptoms.  A diet and exercise plan that is right for you. What should I know about screenings and tests to prevent falls? Screening and testing are the best ways to find a health problem early. Early diagnosis and treatment give you the best chance of managing medical conditions that are common after age 83. Certain conditions and lifestyle choices may make you more likely to have a fall. Your health care provider may recommend:  Regular vision checks. Poor vision and conditions such as cataracts can make you more likely to have a fall. If you wear glasses, make sure to get your prescription updated if your vision changes.  Medicine review. Work with your health care provider to regularly review all of the medicines you are taking, including over-the-counter medicines. Ask your health care provider about any side effects that may make you more likely to have a fall. Tell your health care provider if any medicines that you take make you feel dizzy or sleepy.  Osteoporosis screening. Osteoporosis is a condition that causes the bones to get weaker. This can make the bones weak and cause them to break more easily.  Blood pressure screening. Blood pressure changes and medicines to control blood pressure can make you feel dizzy.  Strength and balance checks. Your health care provider may recommend certain tests to check your  strength and balance while standing, walking, or changing positions.  Foot health exam. Foot pain and numbness, as well as not wearing proper footwear, can make you more likely to have a fall.  Depression screening. You may be more likely to have a fall if you have a fear of falling, feel emotionally low, or feel unable to do activities that you used to do.  Alcohol use screening. Using too much alcohol can affect your balance and may make you more likely to have a fall. What actions can I take to lower my risk of falls? General instructions  Talk with your health care provider about your risks for falling. Tell your health care provider if: ? You fall. Be sure to tell your health care provider about all falls, even ones that seem minor. ? You feel dizzy, sleepy, or off-balance.  Take over-the-counter and prescription medicines only as told by your health care provider. These include any supplements.  Eat a healthy diet and maintain a healthy weight. A healthy diet includes low-fat dairy products, low-fat (lean) meats, and fiber from whole grains, beans, and lots of fruits and vegetables. Home safety  Remove any tripping hazards, such as rugs, cords, and clutter.  Install safety equipment such as grab bars in bathrooms and safety rails on stairs.  Keep rooms and walkways well-lit. Activity   Follow a regular exercise program to stay fit. This will help you maintain your balance. Ask your health care provider what types of exercise are appropriate for you.  If you need a cane or   walker, use it as recommended by your health care provider.  Wear supportive shoes that have nonskid soles. Lifestyle  Do not drink alcohol if your health care provider tells you not to drink.  If you drink alcohol, limit how much you have: ? 0-1 drink a day for women. ? 0-2 drinks a day for men.  Be aware of how much alcohol is in your drink. In the [place] one drink equals one typical bottle of beer (12  oz), one-half glass of wine (5 oz), or one shot of hard liquor (1 oz).  Do not use any products that contain nicotine or tobacco, such as cigarettes and e-cigarettes. If you need help quitting, ask your health care provider. Summary  Having a healthy lifestyle and getting preventive care can help to protect your health and wellness after age 83.  Screening and testing are the best way to find a health problem early and help you avoid having a fall. Early diagnosis and treatment give you the best chance for managing medical conditions that are more common for people who are older than age 83.  Falls are a major cause of broken bones and head injuries in people who are older than age 83. Take precautions to prevent a fall at home.  Work with your health care provider to learn what changes you can make to improve your health and wellness and to prevent falls. This information is not intended to replace advice given to you by your health care provider. Make sure you discuss any questions you have with your health care provider. Document Released: 11/30/2016 Document Revised: 05/10/2018 Document Reviewed: 11/30/2016 Elsevier Patient Education  2020 [employer]  

## 2018-09-13 NOTE — Progress Notes (Signed)
HPI:  Pt presents to the clinic today for her subsequent Medicare Wellness Exam. She is also due to follow up chronic conditions.   GERD: She is not sure what triggers this. She denies breakthrough on Pantoprazole. She has had an upper GI but there is no report in Epic.  HLD: Her last LDL 178, 07/2017. She has been out of her Rosuvastatin for more than 3 months. She does not consume a low fat diet.  HTN: Her BP today is 142/88. She is not taking any antihypertensive medication at this time. ECG from 07/2018.  Hypothyroidism: She has noticed some hair loss. She denies any issues on her current dose of Levothyroxine. She is due for repeat labs.  OA: Mainly in her right hip, right knee, right shoulder. She is taking Advil as needed with some relief  Osteoporosis: Bone density from reviewed. She is taking Calcium and Vit D OTC. She gets weight bearing exercise daily.     Past Medical History:  Diagnosis Date  . Allergy   . GERD (gastroesophageal reflux disease)   . Hyperlipidemia   . Hypertension    diuretic for edema at first  . Hypothyroidism   . Osteoarthritis, multiple sites   . Osteoporosis     Current Outpatient Medications  Medication Sig Dispense Refill  . aspirin 81 MG EC tablet Take 81 mg by mouth daily.      . cetirizine (ZYRTEC) 10 MG tablet Take 10 mg by mouth daily.      . Cholecalciferol (VITAMIN D3) 2000 UNITS TABS Take by mouth.      . Cyanocobalamin (VITAMIN B 12 PO) Take by mouth daily.    . hydrocortisone 2.5 % lotion Apply topically 2 (two) times daily. 59 mL 0  . levothyroxine (SYNTHROID) 75 MCG tablet TAKE 1 TABLET BY MOUTH ONCE A DAY BEFOREBREAKFAST. 30 tablet 2  . OVER THE COUNTER MEDICATION Take 1 capsule by mouth daily. Probiotic    . pantoprazole (PROTONIX) 20 MG tablet     . predniSONE (DELTASONE) 10 MG tablet Take 3 tabs on days 1-3, take 2 tabs on days 4-6, take 1 tab on days 7-9 18 tablet 0  . rosuvastatin (CRESTOR) 20 MG tablet TAKE 1 TABLET BY  MOUTH ONCE DAILY 90 tablet 1   No current facility-administered medications for this visit.     No Known Allergies  Family History  Problem Relation Age of Onset  . Heart disease Mother   . Stroke Father   . Hypertension Father   . Heart disease Father   . Diabetes Father     Social History   Socioeconomic History  . Marital status: Divorced    Spouse name: Not on file  . Number of children: 3  . Years of education: Not on file  . Highest education level: Not on file  Occupational History  . Occupation: Audiological scientist    Comment: [employer]  . Financial resource strain: Not on file  . Food insecurity    Worry: Not on file    Inability: Not on file  . Transportation needs    Medical: Not on file    Non-medical: Not on file  Tobacco Use  . Smoking status: Never Smoker  . Smokeless tobacco: Never Used  Substance and Sexual Activity  . Alcohol use: No  . Drug use: No  . Sexual activity: Never  Lifestyle  . Physical activity    Days per week: Not on file  Minutes per session: Not on file  . Stress: Not on file  Relationships  . Social Herbalist on phone: Not on file    Gets together: Not on file    Attends religious service: Not on file    Active member of club or organization: Not on file    Attends meetings of clubs or organizations: Not on file    Relationship status: Not on file  . Intimate partner violence    Fear of current or ex partner: Not on file    Emotionally abused: Not on file    Physically abused: Not on file    Forced sexual activity: Not on file  Other Topics Concern  . Not on file  Social History Narrative   3 children--- 1 died   Lives alone      No living will   Requests son and daughter as health care POAs   Would accept resuscitation attempts but no prolonged artificial ventilation   Not sure about tube feeds    Hospitiliaztions: None  Health Maintenance:    Flu: 10/2017  Tetanus:  01/2006  Pneumovax: 01/2006  Prevnar: 05/2014  Zostavax: 07/2013  Mammogram: diagnostic mammogram and ultrasound scheduled 8/24  Pap Smear: no longer screening  Bone Density: 01/2015  Colon Screening: 01/2015  Eye Doctor: annually  Dental Exam: biannually   Providers:   PCP: Webb Silversmith, NP  Gastroenterologist: Dr. Earlean Shawl    I have personally reviewed and have noted:  1. The patient's medical and social history 2. Their use of alcohol, tobacco or illicit drugs 3. Their current medications and supplements 4. The patient's functional ability including ADL's, fall risks, home safety risks and hearing or visual impairment. 5. Diet and physical activities 6. Evidence for depression or mood disorder  Subjective:   Review of Systems:   Constitutional: Denies fever, malaise, fatigue, headache or abrupt weight changes.  HEENT: Pt reports hair loss. Denies eye pain, eye redness, ear pain, ringing in the ears, wax buildup, runny nose, nasal congestion, bloody nose, or sore throat. Respiratory: Pt reports intermittent DOE. Denies difficulty breathing, shortness of breath, cough or sputum production.   Cardiovascular: Denies chest pain, chest tightness, palpitations or swelling in the hands or feet.  Gastrointestinal: Denies abdominal pain, bloating, constipation, diarrhea or blood in the stool.  GU: Denies urgency, frequency, pain with urination, burning sensation, blood in urine, odor or discharge. Musculoskeletal: Pt reports right knee pain. Denies decrease in range of motion, difficulty with gait, muscle pain or joint swelling.  Skin: Denies redness, rashes, lesions or ulcercations.  Neurological: Denies dizziness, difficulty with memory, difficulty with speech or problems with balance and coordination.  Psych: Denies anxiety, depression, SI/HI.  No other specific complaints in a complete review of systems (except as listed in HPI above).  Objective:  PE:  BP (!) 142/88   Pulse 63    Temp 97.7 F (36.5 C) (Temporal)   Ht 5' 3.5" (1.613 m)   Wt 146 lb (66.2 kg)   SpO2 98%   BMI 25.46 kg/m   Wt Readings from Last 3 Encounters:  09/10/18 147 lb 8 oz (66.9 kg)  07/16/18 147 lb (66.7 kg)  02/09/18 149 lb 8 oz (67.8 kg)    General: Appears her stated age, well developed, well nourished in NAD. Skin: Warm, dry and intact. No rashesnoted. HEENT: Head: normal shape and size; Eyes: sclera white, no icterus, conjunctiva pink, PERRLA and EOMs intact; Ears: Tm's gray and intact, normal  light reflex;  Neck: Neck supple, trachea midline. No masses, lumps or thyromegaly present.  Cardiovascular: Normal rate and rhythm. S1,S2 noted.  No murmur, rubs or gallops noted. No JVD or BLE edema. No carotid bruits noted. Pulmonary/Chest: Normal effort and positive vesicular breath sounds. No respiratory distress. No wheezes, rales or ronchi noted.  Abdomen: Soft and nontender. Normal bowel sounds. No distention or masses noted. Liver, spleen and kidneys non palpable. Musculoskeletal: Normal flexion and extension of the right knee. Pain with palpation of the left medial pes bursa and right lateral joint line. Strength 5/5 BUE/BLE. Limps with normal gait. Neurological: Alert and oriented. Cranial nerves II-XII grossly intact. Coordination normal.  Psychiatric: Mood and affect normal. Behavior is normal. Judgment and thought content normal.    BMET    Component Value Date/Time   NA 140 07/16/2018 1246   K 4.3 07/16/2018 1246   CL 102 07/16/2018 1246   CO2 30 07/16/2018 1246   GLUCOSE 86 07/16/2018 1246   BUN 19 07/16/2018 1246   CREATININE 0.85 07/16/2018 1246   CREATININE 1.11 (H) 07/15/2016 1659   CALCIUM 9.6 07/16/2018 1246   GFRNONAA 68.59 12/04/2009 1131   GFRAA 91 03/12/2008 0928    Lipid Panel     Component Value Date/Time   CHOL 251 (H) 07/20/2017 1004   TRIG 110.0 07/20/2017 1004   TRIG 103 12/06/2005 1055   HDL 50.80 07/20/2017 1004   CHOLHDL 5 07/20/2017 1004    VLDL 22.0 07/20/2017 1004   LDLCALC 178 (H) 07/20/2017 1004    CBC    Component Value Date/Time   WBC 6.3 07/16/2018 1246   RBC 4.50 07/16/2018 1246   HGB 14.0 07/16/2018 1246   HCT 41.5 07/16/2018 1246   PLT 198.0 07/16/2018 1246   MCV 92.3 07/16/2018 1246   MCH 30.4 07/15/2016 1659   MCHC 33.8 07/16/2018 1246   RDW 13.4 07/16/2018 1246   LYMPHSABS 1,007 07/15/2016 1659   MONOABS 848 07/15/2016 1659   EOSABS 53 07/15/2016 1659   BASOSABS 53 07/15/2016 1659    Hgb A1C Lab Results  Component Value Date   HGBA1C 6.2 11/11/2015      Assessment and Plan:   Medicare Annual Wellness Visit:  Diet: She does eat some meat. She consumes fruits and veggies daily. She does eat fried foods. She drinks mostly water. Physical activity: Walking Depression/mood screen: Negative, PHQ 9 score of 1 Hearing: Intact to whispered voice Visual acuity: Grossly normal, performs annual eye exam  ADLs: Capable Fall risk: None Home safety: Good Cognitive evaluation: Intact to orientation, naming, recall and repetition EOL planning: No adv directives, full code/ I agree  Preventative Medicine: Encouraged her to get a flu shot in the fall. Advised her if she gets bitten by an animal or cuts herself, she will need to go get a tetanus vaccines. Pneumovax, prevnar and zostovax UTD. She will think about the shingrix vaccine, advised her if she gets this, it needs to be done at the pharmacy. She declines pap smear. Mammogram schedule. Bone density ordered- she will call to schedule. Colon screening UTD. Encouraged her to consume a balanced diet and exercise regimen. Advised her to see an eye doctor and dentist annually. Will check CBC, CMET, TSH, Free T4, Lipid and Vit D today. Due dates for screening exam given to pt as part of her AVS.  Chronic Right Knee Pain:  Xray right knee today Consider referral to ortho pending labs- she has seen [facility] in the past  Try Aleve 1 tab BID  Next appointment:  6 months, follow up chronic conditions   Webb Silversmith, NP

## 2018-09-13 NOTE — Assessment & Plan Note (Signed)
Bone density ordered Continue Calcium and Vit D today Encouraged regular physical activity

## 2018-09-13 NOTE — Assessment & Plan Note (Signed)
CMET and Lipid profile today Encouraged her to consume a low fat diet Currently not taking Rosuvastatin as prescribed.

## 2018-09-13 NOTE — Assessment & Plan Note (Signed)
Controlled on Pantoprazole CBC and CMET today Will monitor

## 2018-09-14 ENCOUNTER — Other Ambulatory Visit: Payer: Self-pay | Admitting: Internal Medicine

## 2018-09-14 DIAGNOSIS — G8929 Other chronic pain: Secondary | ICD-10-CM

## 2018-09-14 DIAGNOSIS — M25561 Pain in right knee: Secondary | ICD-10-CM

## 2018-09-24 ENCOUNTER — Ambulatory Visit
Admission: RE | Admit: 2018-09-24 | Discharge: 2018-09-24 | Disposition: A | Discharge status: Home / Self Care | Payer: PPO | Source: Ambulatory Visit | Attending: Internal Medicine | Admitting: Internal Medicine

## 2018-09-24 ENCOUNTER — Other Ambulatory Visit: Payer: Self-pay

## 2018-09-24 ENCOUNTER — Telehealth: Payer: Self-pay | Admitting: Internal Medicine

## 2018-09-24 DIAGNOSIS — N6489 Other specified disorders of breast: Secondary | ICD-10-CM | POA: Diagnosis not present

## 2018-09-24 DIAGNOSIS — N6459 Other signs and symptoms in breast: Secondary | ICD-10-CM

## 2018-09-24 DIAGNOSIS — R922 Inconclusive mammogram: Secondary | ICD-10-CM | POA: Diagnosis not present

## 2018-09-24 NOTE — Telephone Encounter (Signed)
Patient stated she is returning a call from you   C/B # [phone]

## 2018-09-26 ENCOUNTER — Encounter: Payer: Self-pay | Admitting: Internal Medicine

## 2018-09-26 DIAGNOSIS — E7800 Pure hypercholesterolemia, unspecified: Secondary | ICD-10-CM

## 2018-09-26 MED ORDER — ROSUVASTATIN CALCIUM 20 MG PO TABS: 20.0000 mg | ORAL_TABLET | Freq: Every day | ORAL | 0 refills | Status: DC

## 2018-09-26 NOTE — Telephone Encounter (Signed)
Patient called back.  She stated she is calling in regards to test results and starting a new medication  .  C/B #  [phone]

## 2018-09-27 ENCOUNTER — Other Ambulatory Visit: Payer: Self-pay | Admitting: Internal Medicine

## 2018-09-27 ENCOUNTER — Encounter: Payer: Self-pay | Admitting: Internal Medicine

## 2018-10-01 ENCOUNTER — Ambulatory Visit
Admission: RE | Admit: 2018-10-01 | Discharge: 2018-10-01 | Disposition: A | Discharge status: Home / Self Care | Payer: PPO | Source: Ambulatory Visit | Attending: Internal Medicine | Admitting: Internal Medicine

## 2018-10-01 ENCOUNTER — Other Ambulatory Visit: Payer: Self-pay

## 2018-10-01 DIAGNOSIS — M810 Age-related osteoporosis without current pathological fracture: Secondary | ICD-10-CM | POA: Diagnosis not present

## 2018-10-01 DIAGNOSIS — M85832 Other specified disorders of bone density and structure, left forearm: Secondary | ICD-10-CM | POA: Diagnosis not present

## 2018-10-01 DIAGNOSIS — M8580 Other specified disorders of bone density and structure, unspecified site: Secondary | ICD-10-CM

## 2018-10-01 DIAGNOSIS — Z780 Asymptomatic menopausal state: Secondary | ICD-10-CM | POA: Diagnosis not present

## 2018-10-01 MED ORDER — CRESTOR 20 MG PO TABS: 20.0000 mg | ORAL_TABLET | Freq: Every day | ORAL | 0 refills | Status: DC

## 2018-10-03 ENCOUNTER — Encounter: Payer: Self-pay | Admitting: Internal Medicine

## 2018-10-03 DIAGNOSIS — M25551 Pain in right hip: Secondary | ICD-10-CM | POA: Diagnosis not present

## 2018-10-03 DIAGNOSIS — M25561 Pain in right knee: Secondary | ICD-10-CM | POA: Diagnosis not present

## 2018-10-03 DIAGNOSIS — M1611 Unilateral primary osteoarthritis, right hip: Secondary | ICD-10-CM | POA: Diagnosis not present

## 2018-10-03 DIAGNOSIS — M1711 Unilateral primary osteoarthritis, right knee: Secondary | ICD-10-CM | POA: Diagnosis not present

## 2018-10-12 DIAGNOSIS — K219 Gastro-esophageal reflux disease without esophagitis: Secondary | ICD-10-CM | POA: Diagnosis not present

## 2018-10-12 DIAGNOSIS — K625 Hemorrhage of anus and rectum: Secondary | ICD-10-CM | POA: Diagnosis not present

## 2018-10-17 ENCOUNTER — Other Ambulatory Visit: Payer: Self-pay | Admitting: Internal Medicine

## 2018-10-17 DIAGNOSIS — E039 Hypothyroidism, unspecified: Secondary | ICD-10-CM

## 2018-10-25 DIAGNOSIS — K648 Other hemorrhoids: Secondary | ICD-10-CM | POA: Diagnosis not present

## 2018-10-25 DIAGNOSIS — K625 Hemorrhage of anus and rectum: Secondary | ICD-10-CM | POA: Diagnosis not present

## 2018-10-25 DIAGNOSIS — Z86010 Personal history of colonic polyps: Secondary | ICD-10-CM | POA: Diagnosis not present

## 2018-10-25 DIAGNOSIS — K5730 Diverticulosis of large intestine without perforation or abscess without bleeding: Secondary | ICD-10-CM | POA: Diagnosis not present

## 2018-10-25 DIAGNOSIS — Z1211 Encounter for screening for malignant neoplasm of colon: Secondary | ICD-10-CM | POA: Diagnosis not present

## 2018-12-12 DIAGNOSIS — K648 Other hemorrhoids: Secondary | ICD-10-CM | POA: Diagnosis not present

## 2019-01-21 ENCOUNTER — Other Ambulatory Visit: Payer: Self-pay | Admitting: Internal Medicine

## 2019-01-21 DIAGNOSIS — E039 Hypothyroidism, unspecified: Secondary | ICD-10-CM

## 2019-01-22 MED ORDER — CRESTOR 20 MG PO TABS: 20.0000 mg | ORAL_TABLET | Freq: Every day | ORAL | 1 refills | Status: DC

## 2019-01-30 DIAGNOSIS — K648 Other hemorrhoids: Secondary | ICD-10-CM | POA: Diagnosis not present

## 2019-02-06 DIAGNOSIS — M542 Cervicalgia: Secondary | ICD-10-CM | POA: Diagnosis not present

## 2019-02-06 DIAGNOSIS — M5030 Other cervical disc degeneration, unspecified cervical region: Secondary | ICD-10-CM | POA: Diagnosis not present

## 2019-02-06 DIAGNOSIS — M47812 Spondylosis without myelopathy or radiculopathy, cervical region: Secondary | ICD-10-CM | POA: Diagnosis not present

## 2019-02-06 DIAGNOSIS — M25511 Pain in right shoulder: Secondary | ICD-10-CM | POA: Diagnosis not present

## 2019-02-13 DIAGNOSIS — H00011 Hordeolum externum right upper eyelid: Secondary | ICD-10-CM | POA: Diagnosis not present

## 2019-02-13 DIAGNOSIS — M542 Cervicalgia: Secondary | ICD-10-CM | POA: Diagnosis not present

## 2019-02-13 DIAGNOSIS — M25511 Pain in right shoulder: Secondary | ICD-10-CM | POA: Diagnosis not present

## 2019-02-17 ENCOUNTER — Ambulatory Visit: Payer: PPO | Attending: Internal Medicine

## 2019-02-17 DIAGNOSIS — Z23 Encounter for immunization: Secondary | ICD-10-CM

## 2019-02-17 NOTE — Progress Notes (Signed)
   Covid-19 Vaccination Clinic  Name:  Amy Lin    MRN: [id number] DOB: [birth year]  02/17/2019  [name] was observed post Covid-19 immunization for 15 minutes without incidence. She was provided with Vaccine Information Sheet and instruction to access the V-Safe system.   [name] was instructed to call 911 with any severe reactions post vaccine: Marland Kitchen Difficulty breathing  . Swelling of your face and throat  . A fast heartbeat  . A bad rash all over your body  . Dizziness and weakness

## 2019-02-18 DIAGNOSIS — M542 Cervicalgia: Secondary | ICD-10-CM | POA: Diagnosis not present

## 2019-02-18 DIAGNOSIS — M25511 Pain in right shoulder: Secondary | ICD-10-CM | POA: Diagnosis not present

## 2019-02-22 DIAGNOSIS — M542 Cervicalgia: Secondary | ICD-10-CM | POA: Diagnosis not present

## 2019-02-22 DIAGNOSIS — M25511 Pain in right shoulder: Secondary | ICD-10-CM | POA: Diagnosis not present

## 2019-02-26 DIAGNOSIS — M25511 Pain in right shoulder: Secondary | ICD-10-CM | POA: Diagnosis not present

## 2019-02-26 DIAGNOSIS — M542 Cervicalgia: Secondary | ICD-10-CM | POA: Diagnosis not present

## 2019-03-01 DIAGNOSIS — M25511 Pain in right shoulder: Secondary | ICD-10-CM | POA: Diagnosis not present

## 2019-03-01 DIAGNOSIS — M542 Cervicalgia: Secondary | ICD-10-CM | POA: Diagnosis not present

## 2019-03-05 DIAGNOSIS — M25511 Pain in right shoulder: Secondary | ICD-10-CM | POA: Diagnosis not present

## 2019-03-05 DIAGNOSIS — M542 Cervicalgia: Secondary | ICD-10-CM | POA: Diagnosis not present

## 2019-03-10 ENCOUNTER — Ambulatory Visit: Payer: PPO | Attending: Internal Medicine

## 2019-03-10 DIAGNOSIS — Z23 Encounter for immunization: Secondary | ICD-10-CM

## 2019-03-10 NOTE — Progress Notes (Signed)
   Covid-19 Vaccination Clinic  Name:  Amy Lin    MRN: [id number] DOB: [birth year]  03/10/2019  [name] was observed post Covid-19 immunization for 15 minutes without incidence. She was provided with Vaccine Information Sheet and instruction to access the V-Safe system.   [name] was instructed to call 911 with any severe reactions post vaccine: Marland Kitchen Difficulty breathing  . Swelling of your face and throat  . A fast heartbeat  . A bad rash all over your body  . Dizziness and weakness    Immunizations Administered    Name Date Dose VIS Date Route   Pfizer COVID-19 Vaccine 03/10/2019  1:29 PM 0.3 mL 01/11/2019 Intramuscular   Manufacturer: [place]   Lot: [id number]   [place]: [id number]

## 2019-06-14 DIAGNOSIS — H26493 Other secondary cataract, bilateral: Secondary | ICD-10-CM | POA: Diagnosis not present

## 2019-06-27 ENCOUNTER — Ambulatory Visit: Payer: PPO | Admitting: Family

## 2019-07-02 HISTORY — PX: REFRACTIVE SURGERY: SHX103

## 2019-07-11 ENCOUNTER — Encounter: Payer: PPO | Admitting: Internal Medicine

## 2019-07-17 ENCOUNTER — Encounter: Payer: Self-pay | Admitting: Internal Medicine

## 2019-07-17 DIAGNOSIS — H26491 Other secondary cataract, right eye: Secondary | ICD-10-CM | POA: Diagnosis not present

## 2019-07-18 ENCOUNTER — Other Ambulatory Visit: Payer: Self-pay | Admitting: Internal Medicine

## 2019-07-18 DIAGNOSIS — E039 Hypothyroidism, unspecified: Secondary | ICD-10-CM

## 2019-07-19 ENCOUNTER — Ambulatory Visit (INDEPENDENT_AMBULATORY_CARE_PROVIDER_SITE_OTHER): Payer: PPO | Admitting: Internal Medicine

## 2019-07-19 ENCOUNTER — Other Ambulatory Visit: Payer: Self-pay

## 2019-07-19 ENCOUNTER — Encounter: Payer: Self-pay | Admitting: Internal Medicine

## 2019-07-19 VITALS — BP 162/92 | HR 76 | Temp 97.0°F | Wt 147.0 lb

## 2019-07-19 DIAGNOSIS — R4701 Aphasia: Secondary | ICD-10-CM | POA: Diagnosis not present

## 2019-07-19 DIAGNOSIS — R42 Dizziness and giddiness: Secondary | ICD-10-CM

## 2019-07-19 DIAGNOSIS — I10 Essential (primary) hypertension: Secondary | ICD-10-CM

## 2019-07-19 DIAGNOSIS — E7800 Pure hypercholesterolemia, unspecified: Secondary | ICD-10-CM | POA: Diagnosis not present

## 2019-07-19 LAB — COMPREHENSIVE METABOLIC PANEL
AG Ratio: 1.3 (calc) (ref 1.0–2.5)
ALT: 10 U/L (ref 6–29)
AST: 16 U/L (ref 10–35)
Albumin: 3.9 g/dL (ref 3.6–5.1)
Alkaline phosphatase (APISO): 66 U/L (ref 37–153)
BUN/Creatinine Ratio: 18 (calc) (ref 6–22)
BUN: 16 mg/dL (ref 7–25)
CO2: 27 mmol/L (ref 20–32)
Calcium: 9.5 mg/dL (ref 8.6–10.4)
Chloride: 104 mmol/L (ref 98–110)
Creat: 0.91 mg/dL — ABNORMAL HIGH (ref 0.60–0.88)
Globulin: 3 g/dL (calc) (ref 1.9–3.7)
Glucose, Bld: 114 mg/dL — ABNORMAL HIGH (ref 65–99)
Potassium: 4.1 mmol/L (ref 3.5–5.3)
Sodium: 140 mmol/L (ref 135–146)
Total Bilirubin: 0.4 mg/dL (ref 0.2–1.2)
Total Protein: 6.9 g/dL (ref 6.1–8.1)

## 2019-07-19 LAB — LIPID PANEL
Cholesterol: 300 mg/dL — ABNORMAL HIGH (ref <200)
HDL: 47 mg/dL — ABNORMAL LOW (ref >=50)
LDL Cholesterol (Calc): 207 mg/dL (calc) — ABNORMAL HIGH
Non-HDL Cholesterol (Calc): 253 mg/dL (calc) — ABNORMAL HIGH (ref <130)
Total CHOL/HDL Ratio: 6.4 (calc) — ABNORMAL HIGH (ref <5.0)
Triglycerides: 260 mg/dL — ABNORMAL HIGH (ref <150)

## 2019-07-19 MED ORDER — LISINOPRIL 10 MG PO TABS: 10.0000 mg | ORAL_TABLET | Freq: Every day | ORAL | 0 refills | Status: DC

## 2019-07-19 NOTE — Patient Instructions (Signed)

## 2019-07-19 NOTE — Addendum Note (Signed)
Addended by: Cloyd Stagers on: 07/19/2019 04:20 PM   Modules accepted: Orders

## 2019-07-19 NOTE — Assessment & Plan Note (Signed)
Will start Lisinopril 10 mg daily Reinforced DASH diet and exercise for weight loss CMET today

## 2019-07-19 NOTE — Progress Notes (Signed)
Subjective:    Patient ID: Amy Lin, female    DOB: Jun 08, 1933, 86 y.o.   MRN: [id number]  HPI  Patient presents to the clinic today with complaints of elevated blood pressure. She reports her BP has been as high as 210/97 at the surgical center. She denies headaches, visual changes, chest pain or SOB. She had 1 episode of dizziness with expressive aphasia last week while in the kitchen that lasted about 30 mintues but none since that time. She does feel tired from time to time. She has never been diagnosed with HTN in the past. Her BP today is 162/92.    Review of Systems      Past Medical History:  Diagnosis Date  . Allergy   . GERD (gastroesophageal reflux disease)   . Hyperlipidemia   . Hypertension    diuretic for edema at first  . Hypothyroidism   . Osteoarthritis, multiple sites   . Osteoporosis     Current Outpatient Medications  Medication Sig Dispense Refill  . aspirin 81 MG EC tablet Take 81 mg by mouth daily.      . cetirizine (ZYRTEC) 10 MG tablet Take 10 mg by mouth daily.      . Cholecalciferol (VITAMIN D3) 2000 UNITS TABS Take by mouth.      . CRESTOR 20 MG tablet Take 1 tablet (20 mg total) by mouth daily. 90 tablet 1  . Cyanocobalamin (VITAMIN B 12 PO) Take by mouth daily.    Marland Kitchen levothyroxine (SYNTHROID) 75 MCG tablet TAKE 1 TABLET BY MOUTH ONCE A DAY ON AN EMPTY STOMACH.TAKE WITH A GLASS OF WATERAT LEAST 30-60 MIN BEFORE BREAKFAST 90 tablet 0  . OVER THE COUNTER MEDICATION Take 1 capsule by mouth daily. Probiotic    . pantoprazole (PROTONIX) 20 MG tablet      No current facility-administered medications for this visit.    No Known Allergies  Family History  Problem Relation Age of Onset  . Heart disease Mother   . Stroke Father   . Hypertension Father   . Heart disease Father   . Diabetes Father     Social History   Socioeconomic History  . Marital status: Divorced    Spouse name: Not on file  . Number of children: 3  . Years of  education: Not on file  . Highest education level: Not on file  Occupational History  . Occupation: Audiological scientist    Comment: [employer]  Tobacco Use  . Smoking status: Never Smoker  . Smokeless tobacco: Never Used  Substance and Sexual Activity  . Alcohol use: No  . Drug use: No  . Sexual activity: Never  Other Topics Concern  . Not on file  Social History Narrative   3 children--- 1 died   Lives alone      No living will   Requests son and daughter as health care POAs   Would accept resuscitation attempts but no prolonged artificial ventilation   Not sure about tube feeds   Social Determinants of Health   Financial Resource Strain:   . Difficulty of Paying Living Expenses:   Food Insecurity:   . Worried About Charity fundraiser in the Last Year:   . Arboriculturist in the Last Year:   Transportation Needs:   . Film/video editor (Medical):   Marland Kitchen Lack of Transportation (Non-Medical):   Physical Activity:   . Days of Exercise per Week:   . Minutes of Exercise  per Session:   Stress:   . Feeling of Stress :   Social Connections:   . Frequency of Communication with Friends and Family:   . Frequency of Social Gatherings with Friends and Family:   . Attends Religious Services:   . Active Member of Clubs or Organizations:   . Attends Archivist Meetings:   Marland Kitchen Marital Status:   Intimate Partner Violence:   . Fear of Current or Ex-Partner:   . Emotionally Abused:   Marland Kitchen Physically Abused:   . Sexually Abused:      Constitutional: Denies fever, malaise, fatigue, headache or abrupt weight changes.  HEENT: Denies eye pain, eye redness, ear pain, ringing in the ears, wax buildup, runny nose, nasal congestion, bloody nose, or sore throat. Respiratory: Denies difficulty breathing, shortness of breath, cough or sputum production.   Cardiovascular: Denies chest pain, chest tightness, palpitations or swelling in the hands or feet.  Neurological: Pt reports  intermittent dizziness and expressive aphasia. Denies difficulty with memory, difficulty with speech or problems with balance and coordination.    No other specific complaints in a complete review of systems (except as listed in HPI above).  Objective:   Physical Exam   BP (!) 162/92   Pulse 76   Temp (!) 97 F (36.1 C) (Temporal)   Wt 147 lb (66.7 kg)   SpO2 98%   BMI 25.63 kg/m   Wt Readings from Last 3 Encounters:  09/13/18 146 lb (66.2 kg)  09/10/18 147 lb 8 oz (66.9 kg)  07/16/18 147 lb (66.7 kg)    General: Appears her stated age, well developed, well nourished in NAD. HEENT: Head: normal shape and size; Eyes: sclera white, no icterus, conjunctiva pink, PERRLA and EOMs intact; Cardiovascular: Normal rate and rhythm. S1,S2 noted.  No murmur, rubs or gallops noted. No JVD or BLE edema.  Pulmonary/Chest: Normal effort and positive vesicular breath sounds. No respirator distress. No wheezes, rales or ronchi noted.  Neurological: Alert and oriented. Coordination normal.   BMET    Component Value Date/Time   NA 141 09/13/2018 1212   K 4.0 09/13/2018 1212   CL 106 09/13/2018 1212   CO2 27 09/13/2018 1212   GLUCOSE 98 09/13/2018 1212   BUN 19 09/13/2018 1212   CREATININE 0.90 09/13/2018 1212   CREATININE 1.11 (H) 07/15/2016 1659   CALCIUM 9.6 09/13/2018 1212   GFRNONAA 68.59 12/04/2009 1131   GFRAA 91 03/12/2008 0928    Lipid Panel     Component Value Date/Time   CHOL 304 (H) 09/13/2018 1212   TRIG 84.0 09/13/2018 1212   TRIG 103 12/06/2005 1055   HDL 50.60 09/13/2018 1212   CHOLHDL 6 09/13/2018 1212   VLDL 16.8 09/13/2018 1212   LDLCALC 237 (H) 09/13/2018 1212    CBC    Component Value Date/Time   WBC 6.8 09/13/2018 1212   RBC 4.33 09/13/2018 1212   HGB 13.7 09/13/2018 1212   HCT 40.4 09/13/2018 1212   PLT 205.0 09/13/2018 1212   MCV 93.4 09/13/2018 1212   MCH 30.4 07/15/2016 1659   MCHC 33.8 09/13/2018 1212   RDW 13.6 09/13/2018 1212   LYMPHSABS  1,007 07/15/2016 1659   MONOABS 848 07/15/2016 1659   EOSABS 53 07/15/2016 1659   BASOSABS 53 07/15/2016 1659    Hgb A1C Lab Results  Component Value Date   HGBA1C 6.2 11/11/2015           Assessment & Plan:   Intermittent Dizziness, Expressive Aphasia:  Concern that she is having TIA's Repeat CMET and Lipid profile today She will continue baby ASA daily Will start Lisinopril 10 mg daily MRI of brain ordered for further evaluation  Will follow up after labs and xray, return precautions discussed  Webb Silversmith, NP This visit occurred during the SARS-CoV-2 public health emergency.  Safety protocols were in place, including screening questions prior to the visit, additional usage of staff PPE, and extensive cleaning of exam room while observing appropriate contact time as indicated for disinfecting solutions.

## 2019-07-24 DIAGNOSIS — H26492 Other secondary cataract, left eye: Secondary | ICD-10-CM | POA: Diagnosis not present

## 2019-07-30 ENCOUNTER — Encounter: Payer: Self-pay | Admitting: Internal Medicine

## 2019-08-02 ENCOUNTER — Encounter: Payer: Self-pay | Admitting: Internal Medicine

## 2019-08-02 ENCOUNTER — Ambulatory Visit (INDEPENDENT_AMBULATORY_CARE_PROVIDER_SITE_OTHER): Payer: PPO | Admitting: Internal Medicine

## 2019-08-02 ENCOUNTER — Other Ambulatory Visit: Payer: Self-pay

## 2019-08-02 VITALS — BP 154/84 | HR 69 | Temp 98.3°F | Wt 147.0 lb

## 2019-08-02 DIAGNOSIS — I10 Essential (primary) hypertension: Secondary | ICD-10-CM | POA: Diagnosis not present

## 2019-08-02 NOTE — Progress Notes (Signed)
Subjective:    Patient ID: Amy Lin, female    DOB: Dec 01, 1933, 86 y.o.   MRN: [id number]  HPI  Pt presents to the clinic today for 2 week follow up HTN. At her last visit, she was started on Lisinopril. She has been taking the medication as prescribed. She denies adverse side effects. Her BP today is 154/84. ECG from 07/2018 reviewed.  Review of Systems      Past Medical History:  Diagnosis Date  . Allergy   . GERD (gastroesophageal reflux disease)   . Hyperlipidemia   . Hypertension    diuretic for edema at first  . Hypothyroidism   . Osteoarthritis, multiple sites   . Osteoporosis     Current Outpatient Medications  Medication Sig Dispense Refill  . aspirin 81 MG EC tablet Take 81 mg by mouth daily.      . cetirizine (ZYRTEC) 10 MG tablet Take 10 mg by mouth daily.      . Cholecalciferol (VITAMIN D3) 2000 UNITS TABS Take by mouth.      . CRESTOR 20 MG tablet Take 1 tablet (20 mg total) by mouth daily. 90 tablet 1  . Cyanocobalamin (VITAMIN B 12 PO) Take by mouth daily.    Marland Kitchen levothyroxine (SYNTHROID) 75 MCG tablet TAKE 1 TABLET BY MOUTH ONCE A DAY ON AN EMPTY STOMACH.TAKE WITH A GLASS OF WATERAT LEAST 30-60 MIN BEFORE BREAKFAST 90 tablet 0  . lisinopril (ZESTRIL) 10 MG tablet Take 1 tablet (10 mg total) by mouth daily. 30 tablet 0  . OVER THE COUNTER MEDICATION Take 1 capsule by mouth daily. Probiotic    . pantoprazole (PROTONIX) 20 MG tablet      No current facility-administered medications for this visit.    No Known Allergies  Family History  Problem Relation Age of Onset  . Heart disease Mother   . Stroke Father   . Hypertension Father   . Heart disease Father   . Diabetes Father     Social History   Socioeconomic History  . Marital status: Divorced    Spouse name: Not on file  . Number of children: 3  . Years of education: Not on file  . Highest education level: Not on file  Occupational History  . Occupation: Audiological scientist    Comment: [employer]  Tobacco Use  . Smoking status: Never Smoker  . Smokeless tobacco: Never Used  Substance and Sexual Activity  . Alcohol use: No  . Drug use: No  . Sexual activity: Never  Other Topics Concern  . Not on file  Social History Narrative   3 children--- 1 died   Lives alone      No living will   Requests son and daughter as health care POAs   Would accept resuscitation attempts but no prolonged artificial ventilation   Not sure about tube feeds   Social Determinants of Health   Financial Resource Strain:   . Difficulty of Paying Living Expenses:   Food Insecurity:   . Worried About Charity fundraiser in the Last Year:   . Arboriculturist in the Last Year:   Transportation Needs:   . Film/video editor (Medical):   Marland Kitchen Lack of Transportation (Non-Medical):   Physical Activity:   . Days of Exercise per Week:   . Minutes of Exercise per Session:   Stress:   . Feeling of Stress :   Social Connections:   . Frequency of Communication with  Friends and Family:   . Frequency of Social Gatherings with Friends and Family:   . Attends Religious Services:   . Active Member of Clubs or Organizations:   . Attends Archivist Meetings:   Marland Kitchen Marital Status:   Intimate Partner Violence:   . Fear of Current or Ex-Partner:   . Emotionally Abused:   Marland Kitchen Physically Abused:   . Sexually Abused:      Constitutional: Denies fever, malaise, fatigue, headache or abrupt weight changes.  Respiratory: Denies difficulty breathing, shortness of breath, cough or sputum production.   Cardiovascular: Denies chest pain, chest tightness, palpitations or swelling in the hands or feet.  Neurological: Denies dizziness, difficulty with memory, difficulty with speech or problems with balance and coordination.    No other specific complaints in a complete review of systems (except as listed in HPI above).  Objective:   Physical Exam   Wt Readings from Last 3 Encounters:  07/19/19 147 lb  (66.7 kg)  09/13/18 146 lb (66.2 kg)  09/10/18 147 lb 8 oz (66.9 kg)    General: Appears her stated age, well developed, well nourished in NAD. Cardiovascular: Normal rate and rhythm. S1,S2 noted.  No murmur, rubs or gallops noted. No JVD or BLE edema.  Pulmonary/Chest: Normal effort and positive vesicular breath sounds. No respiratory distress. No wheezes, rales or ronchi noted.  Neurological: Alert and oriented.    BMET    Component Value Date/Time   NA 140 07/19/2019 1613   K 4.1 07/19/2019 1613   CL 104 07/19/2019 1613   CO2 27 07/19/2019 1613   GLUCOSE 114 (H) 07/19/2019 1613   BUN 16 07/19/2019 1613   CREATININE 0.91 (H) 07/19/2019 1613   CALCIUM 9.5 07/19/2019 1613   GFRNONAA 68.59 12/04/2009 1131   GFRAA 91 03/12/2008 0928    Lipid Panel     Component Value Date/Time   CHOL 300 (H) 07/19/2019 1613   TRIG 260 (H) 07/19/2019 1613   TRIG 103 12/06/2005 1055   HDL 47 (L) 07/19/2019 1613   CHOLHDL 6.4 (H) 07/19/2019 1613   VLDL 16.8 09/13/2018 1212   LDLCALC 207 (H) 07/19/2019 1613    CBC    Component Value Date/Time   WBC 6.8 09/13/2018 1212   RBC 4.33 09/13/2018 1212   HGB 13.7 09/13/2018 1212   HCT 40.4 09/13/2018 1212   PLT 205.0 09/13/2018 1212   MCV 93.4 09/13/2018 1212   MCH 30.4 07/15/2016 1659   MCHC 33.8 09/13/2018 1212   RDW 13.6 09/13/2018 1212   LYMPHSABS 1,007 07/15/2016 1659   MONOABS 848 07/15/2016 1659   EOSABS 53 07/15/2016 1659   BASOSABS 53 07/15/2016 1659    Hgb A1C Lab Results  Component Value Date   HGBA1C 6.2 11/11/2015           Assessment & Plan:    Webb Silversmith, NP This visit occurred during the SARS-CoV-2 public health emergency.  Safety protocols were in place, including screening questions prior to the visit, additional usage of staff PPE, and extensive cleaning of exam room while observing appropriate contact time as indicated for disinfecting solutions.

## 2019-08-02 NOTE — Assessment & Plan Note (Signed)
Increase Lisinopril to 20 mg daily Monitor daily at home, update me in 1 week via mychart

## 2019-08-02 NOTE — Patient Instructions (Signed)

## 2019-08-05 ENCOUNTER — Encounter: Payer: Self-pay | Admitting: Internal Medicine

## 2019-08-06 ENCOUNTER — Ambulatory Visit: Payer: PPO

## 2019-08-09 ENCOUNTER — Encounter: Payer: Self-pay | Admitting: Internal Medicine

## 2019-08-09 ENCOUNTER — Other Ambulatory Visit: Payer: Self-pay | Admitting: Internal Medicine

## 2019-08-12 MED ORDER — LISINOPRIL 20 MG PO TABS: 20.0000 mg | ORAL_TABLET | Freq: Every day | ORAL | 0 refills | Status: DC

## 2019-09-16 ENCOUNTER — Encounter: Payer: PPO | Admitting: Internal Medicine

## 2019-09-24 ENCOUNTER — Other Ambulatory Visit: Payer: Self-pay | Admitting: Internal Medicine

## 2019-09-24 DIAGNOSIS — Z1231 Encounter for screening mammogram for malignant neoplasm of breast: Secondary | ICD-10-CM

## 2019-09-25 ENCOUNTER — Encounter: Payer: Self-pay | Admitting: Internal Medicine

## 2019-09-25 ENCOUNTER — Ambulatory Visit (INDEPENDENT_AMBULATORY_CARE_PROVIDER_SITE_OTHER): Payer: PPO | Admitting: Internal Medicine

## 2019-09-25 ENCOUNTER — Other Ambulatory Visit: Payer: Self-pay

## 2019-09-25 VITALS — BP 144/82 | HR 68 | Temp 98.0°F | Ht 63.5 in | Wt 147.0 lb

## 2019-09-25 DIAGNOSIS — K2100 Gastro-esophageal reflux disease with esophagitis, without bleeding: Secondary | ICD-10-CM

## 2019-09-25 DIAGNOSIS — M19072 Primary osteoarthritis, left ankle and foot: Secondary | ICD-10-CM | POA: Diagnosis not present

## 2019-09-25 DIAGNOSIS — E039 Hypothyroidism, unspecified: Secondary | ICD-10-CM

## 2019-09-25 DIAGNOSIS — I10 Essential (primary) hypertension: Secondary | ICD-10-CM | POA: Diagnosis not present

## 2019-09-25 DIAGNOSIS — M810 Age-related osteoporosis without current pathological fracture: Secondary | ICD-10-CM | POA: Diagnosis not present

## 2019-09-25 DIAGNOSIS — R200 Anesthesia of skin: Secondary | ICD-10-CM

## 2019-09-25 DIAGNOSIS — E7800 Pure hypercholesterolemia, unspecified: Secondary | ICD-10-CM | POA: Diagnosis not present

## 2019-09-25 DIAGNOSIS — J301 Allergic rhinitis due to pollen: Secondary | ICD-10-CM

## 2019-09-25 DIAGNOSIS — M19071 Primary osteoarthritis, right ankle and foot: Secondary | ICD-10-CM

## 2019-09-25 DIAGNOSIS — Z0000 Encounter for general adult medical examination without abnormal findings: Secondary | ICD-10-CM | POA: Diagnosis not present

## 2019-09-25 MED ORDER — METHYLPREDNISOLONE ACETATE 80 MG/ML IJ SUSP
80.0000 mg | Freq: Once | INTRAMUSCULAR | Status: AC
  Administered 2019-09-25: 80 mg via INTRAMUSCULAR

## 2019-09-25 NOTE — Assessment & Plan Note (Signed)
Controlled on Lisinopril CMET today Reinforced DASH diet

## 2019-09-25 NOTE — Addendum Note (Signed)
Addended by: Lurlean Nanny on: 09/25/2019 03:04 PM   Modules accepted: Orders

## 2019-09-25 NOTE — Assessment & Plan Note (Signed)
CMET and Lipid profile today Encouraged her to consume a low fat diet Continue Rosuvastatin 

## 2019-09-25 NOTE — Patient Instructions (Signed)

## 2019-09-25 NOTE — Assessment & Plan Note (Signed)
Continue Ibuprofen as needed. 

## 2019-09-25 NOTE — Progress Notes (Signed)
HPI:  Pt presents to the clinic today with her subsequent annual Medicare Wellness Exam. She is also due to follow up chronic conditions.  GERD: She is not sure what triggers this. She denies breakthrough on Pantoprazole. There is no upper GI on file.  HLD: Her last LDL was 207, triglycerides 260, 07/2019. She denies myalgias on Rosuvastatin (she had not been taking at the time cholesterol was checked). She does not consume a low fat diet.  HTN: Her BP today is 144/82. She is taking Lisinopril as prescribed. ECG from 07/2018 reviewed.  Hypothyroidism: She denies any issues on her current dose of Levothyroxine. She does not follow with endocrinology.  OA: Mainly in her right hip, right knee and right shoulder. She takes Ibuprofen as needed with good relief of symptoms.  Osteoporosis: She is taking Calcium and Vit D as prescribed. She gets weight bearing exercise daily. Bone density from 09/2018 reviewed.  Past Medical History:  Diagnosis Date  . Allergy   . GERD (gastroesophageal reflux disease)   . Hyperlipidemia   . Hypertension    diuretic for edema at first  . Hypothyroidism   . Osteoarthritis, multiple sites   . Osteoporosis     Current Outpatient Medications  Medication Sig Dispense Refill  . aspirin 81 MG EC tablet Take 81 mg by mouth daily.      . cetirizine (ZYRTEC) 10 MG tablet Take 10 mg by mouth daily.      . Cholecalciferol (VITAMIN D3) 2000 UNITS TABS Take by mouth.      . CRESTOR 20 MG tablet Take 1 tablet (20 mg total) by mouth daily. 90 tablet 1  . Cyanocobalamin (VITAMIN B 12 PO) Take by mouth daily.    Marland Kitchen levothyroxine (SYNTHROID) 75 MCG tablet TAKE 1 TABLET BY MOUTH ONCE A DAY ON AN EMPTY STOMACH.TAKE WITH A GLASS OF WATERAT LEAST 30-60 MIN BEFORE BREAKFAST 90 tablet 0  . lisinopril (ZESTRIL) 20 MG tablet Take 1 tablet (20 mg total) by mouth daily. 30 tablet 0  . OVER THE COUNTER MEDICATION Take 1 capsule by mouth daily. Probiotic    . pantoprazole (PROTONIX) 20  MG tablet      No current facility-administered medications for this visit.    No Known Allergies  Family History  Problem Relation Age of Onset  . Heart disease Mother   . Stroke Father   . Hypertension Father   . Heart disease Father   . Diabetes Father     Social History   Socioeconomic History  . Marital status: Divorced    Spouse name: Not on file  . Number of children: 3  . Years of education: Not on file  . Highest education level: Not on file  Occupational History  . Occupation: Audiological scientist    Comment: [employer]  Tobacco Use  . Smoking status: Never Smoker  . Smokeless tobacco: Never Used  Substance and Sexual Activity  . Alcohol use: No  . Drug use: No  . Sexual activity: Never  Other Topics Concern  . Not on file  Social History Narrative   3 children--- 1 died   Lives alone      No living will   Requests son and daughter as health care POAs   Would accept resuscitation attempts but no prolonged artificial ventilation   Not sure about tube feeds   Social Determinants of Health   Financial Resource Strain:   . Difficulty of Paying Living Expenses: Not on file  Food  Insecurity:   . Worried About Charity fundraiser in the Last Year: Not on file  . Ran Out of Food in the Last Year: Not on file  Transportation Needs:   . Lack of Transportation (Medical): Not on file  . Lack of Transportation (Non-Medical): Not on file  Physical Activity:   . Days of Exercise per Week: Not on file  . Minutes of Exercise per Session: Not on file  Stress:   . Feeling of Stress : Not on file  Social Connections:   . Frequency of Communication with Friends and Family: Not on file  . Frequency of Social Gatherings with Friends and Family: Not on file  . Attends Religious Services: Not on file  . Active Member of Clubs or Organizations: Not on file  . Attends Archivist Meetings: Not on file  . Marital Status: Not on file  Intimate Partner Violence:    . Fear of Current or Ex-Partner: Not on file  . Emotionally Abused: Not on file  . Physically Abused: Not on file  . Sexually Abused: Not on file    Hospitiliaztions: None  Health Maintenance:    Flu: 10/2018  Tetanus: 2008  Pneumovax: 2008  Prevnar: 2016  Zostavax: 07/2013  Shingrix: never  Covid: Pfizer  Mammogram: 09/2018, scheduled 9/22  Pap Smear: no longer screening  Bone Density: 09/2018  Colon Screening: 01/2015  Eye Doctor: annually  Dental Exam: biannually   Providers:   PCP: Webb Silversmith, NP   I have personally reviewed and have noted:  1. The patient's medical and social history 2. Their use of alcohol, tobacco or illicit drugs 3. Their current medications and supplements 4. The patient's functional ability including ADL's, fall risks, home safety risks and hearing or visual impairment. 5. Diet and physical activities 6. Evidence for depression or mood disorder  Subjective:   Review of Systems:   Constitutional: Denies fever, malaise, fatigue, headache or abrupt weight changes.  HEENT: Pt reports ear fullness, scratchy throat. Denies eye pain, eye redness, ear pain, ringing in the ears, wax buildup, runny nose, nasal congestion, bloody nose, or sore throat. Respiratory: Denies difficulty breathing, shortness of breath, cough or sputum production.   Cardiovascular: Denies chest pain, chest tightness, palpitations or swelling in the hands or feet.  Gastrointestinal: Denies abdominal pain, bloating, constipation, diarrhea or blood in the stool.  GU: Denies urgency, frequency, pain with urination, burning sensation, blood in urine, odor or discharge. Musculoskeletal: Pt reports intermittent joint pain. Denies decrease in range of motion, difficulty with gait, muscle pain or joint swelling.  Skin: Denies redness, rashes, lesions or ulcercations.  Neurological: Pt reports numbness in feet. Denies dizziness, difficulty with memory, difficulty with speech or  problems with balance and coordination.  Psych: Denies anxiety, depression, SI/HI.  No other specific complaints in a complete review of systems (except as listed in HPI above).  Objective:  PE:   BP (!) 144/82   Pulse 68   Temp 98 F (36.7 C) (Temporal)   Ht 5' 3.5" (1.613 m)   Wt 147 lb (66.7 kg)   SpO2 98%   BMI 25.63 kg/m   Wt Readings from Last 3 Encounters:  08/02/19 147 lb (66.7 kg)  07/19/19 147 lb (66.7 kg)  09/13/18 146 lb (66.2 kg)    General: Appears her stated age, well developed, well nourished in NAD. Skin: Warm, dry and intact. No rashes, lesions or ulcerations noted. HEENT: Head: normal shape and size; Eyes: sclera white,  no icterus, conjunctiva pink, PERRLA and EOMs intact; Ears: Tm's gray and intact, normal light reflex, + serous effusion bilaterally; Throat/Mouth: Teeth present, mucosa erythematous and moist, no exudate, lesions or ulcerations noted.  Neck: Neck supple, trachea midline. No masses, lumps or thyromegaly present.  Cardiovascular: Normal rate and rhythm. S1,S2 noted.  No murmur, rubs or gallops noted. No JVD or BLE edema. No carotid bruits noted. Pulmonary/Chest: Normal effort and positive vesicular breath sounds. No respiratory distress. No wheezes, rales or ronchi noted.  Abdomen: Soft and nontender. Normal bowel sounds. No distention or masses noted. Liver, spleen and kidneys non palpable. Musculoskeletal: . Strength 5/5 BUE/BLE. No signs of joint swelling.  Neurological: Alert and oriented. Cranial nerves II-XII grossly intact. Coordination normal. Decreased sensation to BLE. Psychiatric: Mood and affect normal. Behavior is normal. Judgment and thought content normal.   BMET    Component Value Date/Time   NA 140 07/19/2019 1613   K 4.1 07/19/2019 1613   CL 104 07/19/2019 1613   CO2 27 07/19/2019 1613   GLUCOSE 114 (H) 07/19/2019 1613   BUN 16 07/19/2019 1613   CREATININE 0.91 (H) 07/19/2019 1613   CALCIUM 9.5 07/19/2019 1613    GFRNONAA 68.59 12/04/2009 1131   GFRAA 91 03/12/2008 0928    Lipid Panel     Component Value Date/Time   CHOL 300 (H) 07/19/2019 1613   TRIG 260 (H) 07/19/2019 1613   TRIG 103 12/06/2005 1055   HDL 47 (L) 07/19/2019 1613   CHOLHDL 6.4 (H) 07/19/2019 1613   VLDL 16.8 09/13/2018 1212   LDLCALC 207 (H) 07/19/2019 1613    CBC    Component Value Date/Time   WBC 6.8 09/13/2018 1212   RBC 4.33 09/13/2018 1212   HGB 13.7 09/13/2018 1212   HCT 40.4 09/13/2018 1212   PLT 205.0 09/13/2018 1212   MCV 93.4 09/13/2018 1212   MCH 30.4 07/15/2016 1659   MCHC 33.8 09/13/2018 1212   RDW 13.6 09/13/2018 1212   LYMPHSABS 1,007 07/15/2016 1659   MONOABS 848 07/15/2016 1659   EOSABS 53 07/15/2016 1659   BASOSABS 53 07/15/2016 1659    Hgb A1C Lab Results  Component Value Date   HGBA1C 6.2 11/11/2015      Assessment and Plan:   Medicare Annual Wellness Visit:  Diet:  She does eat meat. She consumes more fruits than veggies. She tries to avoid fried foods. She drinks mostly water, Propel, and sweet tea. Physical activity: Walking, Yardwork. Depression/mood screen: Negative, PHQ 9 score of 0 Hearing: Intact to whispered voice Visual acuity: Grossly normal, performs annual eye exam  ADLs: Capable Fall risk: None Home safety: Good Cognitive evaluation: Intact to orientation, naming, recall and repetition EOL planning: No adv directives, full code/ I agree  Preventative Medicine: Encouraged her to get a flu shot in the fall. She declines tetanus for financial reasons- advised her if she gets cut or bitten, she will need to go get a tetanus. Pneumovax, prevnar, covid and zostovax UTD. She will consider Shingrix. Mammogram scheduled. She no longer wants to screen for cervical cancer- I agree. Bone density and colonoscopy UTD. Encouraged her to consume a balanced diet and exercise regimen. Advised her to see an eye doctor and dentist annually. Will check CBC, CMET, TSH, Free T4, Lipid and  Vit D today. Due dates for screening exam given to patient as part of her AVS.  Numbness of Feet:  Will check B12 and A1C today  Allergic Rhinitis:  80 mg Depo IM  today Change Zyrtec to [employer] OTC  Next appointment: 1 year, Medicare Wellness Exam   Webb Silversmith, NP This visit occurred during the SARS-CoV-2 public health emergency.  Safety protocols were in place, including screening questions prior to the visit, additional usage of staff PPE, and extensive cleaning of exam room while observing appropriate contact time as indicated for disinfecting solutions.

## 2019-09-25 NOTE — Assessment & Plan Note (Signed)
Continue Calcium, Vit D and weight bearing exercise

## 2019-09-25 NOTE — Assessment & Plan Note (Signed)
CBC and CMET today Continue Pantoprazole

## 2019-09-26 LAB — COMPREHENSIVE METABOLIC PANEL
ALT: 11 U/L (ref 0–35)
AST: 17 U/L (ref 0–37)
Albumin: 4.1 g/dL (ref 3.5–5.2)
Alkaline Phosphatase: 54 U/L (ref 39–117)
BUN: 17 mg/dL (ref 6–23)
CO2: 28 mEq/L (ref 19–32)
Calcium: 9.5 mg/dL (ref 8.4–10.5)
Chloride: 104 mEq/L (ref 96–112)
Creatinine, Ser: 0.88 mg/dL (ref 0.40–1.20)
GFR: 61.27 mL/min (ref >60.00)
Glucose, Bld: 95 mg/dL (ref 70–99)
Potassium: 4.3 mEq/L (ref 3.5–5.1)
Sodium: 140 mEq/L (ref 135–145)
Total Bilirubin: 0.5 mg/dL (ref 0.2–1.2)
Total Protein: 7.2 g/dL (ref 6.0–8.3)

## 2019-09-26 LAB — TSH: TSH: 2.47 u[IU]/mL (ref 0.35–4.50)

## 2019-09-26 LAB — LIPID PANEL
Cholesterol: 194 mg/dL (ref 0–200)
HDL: 53.4 mg/dL (ref >39.00)
LDL Cholesterol: 119 mg/dL — ABNORMAL HIGH (ref 0–99)
NonHDL: 140.38
Total CHOL/HDL Ratio: 4
Triglycerides: 107 mg/dL (ref 0.0–149.0)
VLDL: 21.4 mg/dL (ref 0.0–40.0)

## 2019-09-26 LAB — HEMOGLOBIN A1C: Hgb A1c MFr Bld: 6.3 % (ref 4.6–6.5)

## 2019-09-26 LAB — CBC
HCT: 37.7 % (ref 36.0–46.0)
Hemoglobin: 12.7 g/dL (ref 12.0–15.0)
MCHC: 33.7 g/dL (ref 30.0–36.0)
MCV: 93.2 fl (ref 78.0–100.0)
Platelets: 180 10*3/uL (ref 150.0–400.0)
RBC: 4.05 Mil/uL (ref 3.87–5.11)
RDW: 13.8 % (ref 11.5–15.5)
WBC: 7 10*3/uL (ref 4.0–10.5)

## 2019-09-26 LAB — VITAMIN D 25 HYDROXY (VIT D DEFICIENCY, FRACTURES): VITD: 47.5 ng/mL (ref 30.00–100.00)

## 2019-09-26 LAB — VITAMIN B12: Vitamin B-12: 1493 pg/mL — ABNORMAL HIGH (ref 211–911)

## 2019-09-26 LAB — T4, FREE: Free T4: 1.02 ng/dL (ref 0.60–1.60)

## 2019-09-28 ENCOUNTER — Encounter: Payer: Self-pay | Admitting: Internal Medicine

## 2019-10-03 ENCOUNTER — Encounter: Payer: Self-pay | Admitting: Internal Medicine

## 2019-10-04 MED ORDER — PREDNISONE 10 MG PO TABS: ORAL_TABLET | ORAL | 0 refills | Status: DC

## 2019-10-14 ENCOUNTER — Other Ambulatory Visit: Payer: Self-pay | Admitting: Internal Medicine

## 2019-10-14 DIAGNOSIS — E039 Hypothyroidism, unspecified: Secondary | ICD-10-CM

## 2019-10-17 ENCOUNTER — Other Ambulatory Visit: Payer: Self-pay | Admitting: Internal Medicine

## 2019-10-21 DIAGNOSIS — H00014 Hordeolum externum left upper eyelid: Secondary | ICD-10-CM | POA: Diagnosis not present

## 2019-10-23 ENCOUNTER — Other Ambulatory Visit: Payer: Self-pay

## 2019-10-23 ENCOUNTER — Ambulatory Visit
Admission: RE | Admit: 2019-10-23 | Discharge: 2019-10-23 | Disposition: A | Discharge status: Home / Self Care | Payer: PPO | Source: Ambulatory Visit | Attending: Internal Medicine | Admitting: Internal Medicine

## 2019-10-23 DIAGNOSIS — Z1231 Encounter for screening mammogram for malignant neoplasm of breast: Secondary | ICD-10-CM

## 2019-11-15 ENCOUNTER — Other Ambulatory Visit: Payer: Self-pay

## 2019-11-15 ENCOUNTER — Encounter: Payer: Self-pay | Admitting: Emergency Medicine

## 2019-11-15 ENCOUNTER — Emergency Department
Admission: EM | Admit: 2019-11-15 | Discharge: 2019-11-15 | Disposition: A | Discharge status: Home / Self Care | Payer: PPO | Attending: Emergency Medicine | Admitting: Emergency Medicine

## 2019-11-15 DIAGNOSIS — E039 Hypothyroidism, unspecified: Secondary | ICD-10-CM | POA: Diagnosis not present

## 2019-11-15 DIAGNOSIS — I10 Essential (primary) hypertension: Secondary | ICD-10-CM | POA: Insufficient documentation

## 2019-11-15 DIAGNOSIS — Z91038 Other insect allergy status: Secondary | ICD-10-CM | POA: Diagnosis not present

## 2019-11-15 DIAGNOSIS — Z7982 Long term (current) use of aspirin: Secondary | ICD-10-CM | POA: Diagnosis not present

## 2019-11-15 DIAGNOSIS — L500 Allergic urticaria: Secondary | ICD-10-CM | POA: Insufficient documentation

## 2019-11-15 DIAGNOSIS — T783XXA Angioneurotic edema, initial encounter: Secondary | ICD-10-CM

## 2019-11-15 DIAGNOSIS — T63421A Toxic effect of venom of ants, accidental (unintentional), initial encounter: Secondary | ICD-10-CM | POA: Diagnosis not present

## 2019-11-15 DIAGNOSIS — R21 Rash and other nonspecific skin eruption: Secondary | ICD-10-CM | POA: Diagnosis present

## 2019-11-15 DIAGNOSIS — Z79899 Other long term (current) drug therapy: Secondary | ICD-10-CM | POA: Insufficient documentation

## 2019-11-15 MED ORDER — FAMOTIDINE IN NACL 20-0.9 MG/50ML-% IV SOLN
20.0000 mg | Freq: Once | INTRAVENOUS | Status: AC
  Administered 2019-11-15: 20 mg via INTRAVENOUS
  Filled 2019-11-15: qty 50

## 2019-11-15 MED ORDER — DIPHENHYDRAMINE HCL 50 MG/ML IJ SOLN
INTRAMUSCULAR | Status: AC
  Filled 2019-11-15: qty 1

## 2019-11-15 MED ORDER — PREDNISONE 20 MG PO TABS: 40.0000 mg | ORAL_TABLET | Freq: Every day | ORAL | 0 refills | Status: DC

## 2019-11-15 MED ORDER — EPINEPHRINE 0.3 MG/0.3ML IJ SOAJ: 0.3000 mg | Freq: Once | INTRAMUSCULAR | 0 refills | Status: AC

## 2019-11-15 MED ORDER — DIPHENHYDRAMINE HCL 50 MG/ML IJ SOLN
25.0000 mg | Freq: Once | INTRAMUSCULAR | Status: AC
  Administered 2019-11-15: 25 mg via INTRAVENOUS

## 2019-11-15 MED ORDER — METHYLPREDNISOLONE SODIUM SUCC 125 MG IJ SOLR: 125.0000 mg | INTRAMUSCULAR | Status: DC

## 2019-11-15 MED ORDER — EPINEPHRINE 0.15 MG/0.3ML IJ SOAJ
INTRAMUSCULAR | Status: AC
  Filled 2019-11-15: qty 0.3

## 2019-11-15 MED ORDER — EPINEPHRINE 0.15 MG/0.3ML IJ SOAJ
0.1500 mg | Freq: Once | INTRAMUSCULAR | Status: AC
  Administered 2019-11-15: 0.15 mg via INTRAMUSCULAR

## 2019-11-15 MED ORDER — EPINEPHRINE 0.15 MG/0.3ML IJ SOAJ
0.1500 mg | Freq: Once | INTRAMUSCULAR | Status: AC
  Administered 2019-11-15: 0.15 mg via INTRAMUSCULAR
  Filled 2019-11-15: qty 0.6

## 2019-11-15 MED ORDER — EPINEPHRINE PF 1 MG/ML IJ SOLN: 0.1500 mg | Freq: Once | INTRAMUSCULAR | Status: DC

## 2019-11-15 MED ORDER — METHYLPREDNISOLONE SODIUM SUCC 125 MG IJ SOLR
INTRAMUSCULAR | Status: AC
  Administered 2019-11-15: 125 mg via INTRAVENOUS
  Filled 2019-11-15: qty 2

## 2019-11-15 MED ORDER — DIPHENHYDRAMINE HCL 50 MG/ML IJ SOLN
25.0000 mg | Freq: Once | INTRAMUSCULAR | Status: AC
  Administered 2019-11-15: 25 mg via INTRAVENOUS
  Filled 2019-11-15: qty 1

## 2019-11-15 NOTE — ED Triage Notes (Signed)
Pt ems from home for rash, burning on arms s/p ant bites. Pt denies known allergy to ant bites. Denies airway swelling at this time.

## 2019-11-15 NOTE — ED Notes (Signed)
Patient brought to room, patient noted to have full body hives, swelling to lower lip and left eye, patient reports she feels swelling to throat/tongue, reports she was bitten by ants at approx 1800

## 2019-11-15 NOTE — ED Provider Notes (Signed)
[facility] Emergency Department Provider Note   ____________________________________________   First MD Initiated Contact with Patient 11/15/19 1914     (approximate)  I have reviewed the triage vital signs and the nursing notes.   HISTORY  Chief Complaint Allergic Reaction  EM caveat: Acuity of condition, concerns for anaphylaxis and/or angioedema acute involving airway  HPI Amy Lin is a 86 y.o. female   presents for evaluation she reports that about 6 PM she was gardening when she was bit by multiple aunts.  She noticed a burning sensation and started getting swelling and hives over her feet hands and also swelling of her lip particularly the left upper lip  She does not feel that any more answer on her and is not feeling more biting but she feels like she is having a severe allergic reaction.  She has swelling of the left upper lip and feels like the swelling is getting worse  She reports this all started when she was bit by multiple small ants while outside.  No preceding illness.  No fevers or chills.  She reports a slight tightness in her chest and feels like a "burning" feeling across her skin and body  Past Medical History:  Diagnosis Date  . Allergy   . GERD (gastroesophageal reflux disease)   . Hyperlipidemia   . Hypertension    diuretic for edema at first  . Hypothyroidism   . Osteoarthritis, multiple sites   . Osteoporosis     Patient Active Problem List   Diagnosis Date Noted  . Osteoarthritis 07/20/2017  . Seasonal allergies 11/01/2007  . Hyperlipemia 09/06/2006  . Hypothyroidism 08/24/2006  . Essential hypertension, benign 08/24/2006  . GERD 08/24/2006  . Osteoporosis 08/24/2006    Past Surgical History:  Procedure Laterality Date  . ABDOMINAL HYSTERECTOMY    . BACK SURGERY  1999   Discectomy and Ray cage  . BLADDER REPAIR  ~2010  . CARPAL TUNNEL RELEASE  8/15  . COLONOSCOPY W/ BIOPSIES N/A 2016  . MENISCECTOMY  Left 2/15   Dr Veverly Fells  . REFRACTIVE SURGERY Bilateral 07/2019  . UPPER GI ENDOSCOPY N/A Feb 2017    Prior to Admission medications   Medication Sig Start Date End Date Taking? Authorizing Provider  aspirin 81 MG EC tablet Take 81 mg by mouth daily.      [provider]  cetirizine (ZYRTEC) 10 MG tablet Take 10 mg by mouth daily.      [provider]  Cholecalciferol (VITAMIN D3) 2000 UNITS TABS Take by mouth.      [provider]  CRESTOR 20 MG tablet Take 1 tablet (20 mg total) by mouth daily. 01/22/19   [name], NP  Cyanocobalamin (VITAMIN B 12 PO) Take by mouth daily.    [provider]  EPINEPHrine 0.3 mg/0.3 mL IJ SOAJ injection Inject 0.3 mg into the muscle once for 1 dose. 11/15/19 11/15/19  Delman Kitten, MD  levothyroxine (SYNTHROID) 75 MCG tablet TAKE 1 TABLET BY MOUTH ONCE A DAY ON AN EMPTY STOMACH.TAKE WITH A GLASS OF WATERAT LEAST 30-60 MIN BEFORE BREAKFAST 10/14/19   [name], NP  lisinopril (ZESTRIL) 20 MG tablet TAKE 1 TABLET BY MOUTH ONCE A DAY 10/14/19   [name], NP  OVER THE COUNTER MEDICATION Take 1 capsule by mouth daily. Probiotic    [provider]  pantoprazole (PROTONIX) 20 MG tablet TAKE 1 TABLET BY MOUTH TWICE A DAY 10/21/19   Baity,  Coralie Keens, NP  predniSONE (DELTASONE) 10 MG tablet Take 3 tabs on days 1-2, take 2 tabs on days 3-4, take 1 tab on days 5-6 10/04/19   [name], NP  predniSONE (DELTASONE) 20 MG tablet Take 2 tablets (40 mg total) by mouth daily with breakfast. 11/15/19   Delman Kitten, MD    Allergies Patient has no known allergies.  Family History  Problem Relation Age of Onset  . Heart disease Mother   . Stroke Father   . Hypertension Father   . Heart disease Father   . Diabetes Father     Social History Social History   Tobacco Use  . Smoking status: Never Smoker  . Smokeless tobacco: Never Used  Substance Use Topics  . Alcohol use: No  . Drug use: No    Review of  Systems Constitutional: No fever/chills Eyes: No visual changes. ENT: No sore throat.  Feels swelling over her lip however Cardiovascular: Denies chest pain.  Does feel a sense of tightness Respiratory: Denies shortness of breath. Gastrointestinal: No abdominal pain.   Skin: See HPI Neurological: Negative for headaches.    ____________________________________________   PHYSICAL EXAM:  VITAL SIGNS: ED Triage Vitals  Enc Vitals Group     BP 11/15/19 1900 135/77     Pulse Rate 11/15/19 1900 95     Resp --      Temp 11/15/19 1900 98.4 F (36.9 C)     Temp Source 11/15/19 1900 Oral     SpO2 11/15/19 1900 95 %     Weight 11/15/19 1903 145 lb (65.8 kg)     Height 11/15/19 1903 5' 3.4" (1.61 m)     Head Circumference --      Peak Flow --      Pain Score 11/15/19 1903 10     Pain Loc --      Pain Edu? --      Excl. in [place]? --     Constitutional: Alert and oriented.  Well appearing but concerned as she has obvious angioedema of her left upper lip and urticaria skin spread throughout her body although particularly more so over her lower legs bilaterally compared to her hands bilateral Eyes: Conjunctivae are normal. Head: Atraumatic. Nose: No congestion/rhinnorhea. Mouth/Throat: Mucous membranes are moist.  She has no obvious intraoral edema but she does have notable moderate edema especially of the left upper lip Neck: No stridor.  Cardiovascular: Normal rate, regular rhythm. Grossly normal heart sounds.  Good peripheral circulation. Respiratory: Normal respiratory effort.  No retractions. Lungs CTAB. Gastrointestinal: Soft and nontender. No distention. Musculoskeletal: No lower extremity tenderness nor edema. Neurologic:  Normal speech and language. No gross focal neurologic deficits are appreciated.  Skin:  Skin is warm, scattered urticarial rash more so over the lower trunk but also on the upper arms bilateral Psychiatric: Mood and affect are normal. Speech and behavior are  normal.  ____________________________________________   LABS (all labs ordered are listed, but only abnormal results are displayed)  Labs Reviewed - No data to display ____________________________________________  EKG  Reviewed interpreted at [place] rate 95 QRS 99 QTc 469 normal sinus rhythm, no evidence of acute ischemia or ectopy.  Slight baseline wander  EKG performed at [place] rate 75 QRS 90 QTc 440 Normal sinus rhythm, no acute ST abnormalities noted ____________________________________________  RADIOLOGY   ____________________________________________   PROCEDURES  Procedure(s) performed: None  Procedures  Critical Care performed: Yes, see critical care note(s)  CRITICAL CARE Performed  by: Delman Kitten   Total critical care time: 25 minutes  Critical care time was exclusive of separately billable procedures and treating other patients.  Critical care was necessary to treat or prevent imminent or life-threatening deterioration.  Critical care was time spent personally by me on the following activities: development of treatment plan with patient and/or surrogate as well as nursing, discussions with consultants, evaluation of patient's response to treatment, examination of patient, obtaining history from patient or surrogate, ordering and performing treatments and interventions, ordering and review of laboratory studies, ordering and review of radiographic studies, pulse oximetry and re-evaluation of patient's condition.  ____________________________________________   INITIAL IMPRESSION / ASSESSMENT AND PLAN / ED COURSE  Pertinent labs & imaging results that were available during my care of the patient were reviewed by me and considered in my medical decision making (see chart for details).   Patient presents for concerns of sudden swelling, edema, and urticaria after being bit by many small ants in her yard.  She is clinical evidence supporting  angioedema of her airway and moderate to severe systemic reaction.  Clinical Course as of Nov 14 2248  Fri Nov 15, 2019  1927 Patient is fully alert, sugars does appear to have decrease in the edema over the left lip but she does report her throat feels slightly swollen and her tongue feels thickened.  Have ordered additional epinephrine and Benadryl at this time.  She is fully awake and alert, on exam she has very mild lingular edema but the remainder of the posterior oropharynx is clearly patent.  No stridor.   [MQ]  2039 Patient improving, resting comfortably   [MQ]  2218 Patient feeling improved.  Still some just slight edema of her left upper lip and left eyelid, both notably improved.  She is resting comfortably reports she feels much better just a little bit of itching in her forearms at this time.  Much improved.   [MQ]    Clinical Course User Index [MQ] Delman Kitten, MD   ----------------------------------------- 8:00 PM on 11/15/2019 -----------------------------------------   After receiving second dose of adrenaline, patient reports she felt brief chest tightness which is now improving.  EKG performed, no acute ischemic abnormalities noted.  ----------------------------------------- 10:51 PM on 11/15/2019 -----------------------------------------  Patient much improved.  Observe.  She only has some chest slight if any edema of the left upper eyelid and very minimal if trace of the left upper lip corner but no intraoral edema.  She has shown significant improvement she feels much better she has been up and ambulated without difficulty.  Her son is coming to pick her up.  Discussed careful return precautions and treatment recommendations with the patient including use of prednisone which she will pick up tomorrow and to have a as needed epinephrine pen should she have severe reaction such as this again.  She plans to enlist assistance and having the right answer eradicated from the  area of her yard where she encountered them.  No ongoing cardiac respiratory or airway symptoms.  Speaking in full clear sentences  Return precautions and treatment recommendations and follow-up discussed with the patient who is agreeable with the plan.   ____________________________________________   FINAL CLINICAL IMPRESSION(S) / ED DIAGNOSES  Final diagnoses:  Allergy to ant bite  Angioedema of lips, initial encounter  Allergic urticaria        Note:  This document was prepared using Dragon voice recognition software and may include unintentional dictation errors  Delman Kitten, MD 11/15/19 2252

## 2019-11-19 ENCOUNTER — Encounter: Payer: Self-pay | Admitting: Internal Medicine

## 2019-11-19 ENCOUNTER — Other Ambulatory Visit: Payer: Self-pay

## 2019-11-19 ENCOUNTER — Ambulatory Visit (INDEPENDENT_AMBULATORY_CARE_PROVIDER_SITE_OTHER): Payer: PPO | Admitting: Internal Medicine

## 2019-11-19 VITALS — BP 142/82 | HR 72 | Temp 98.4°F | Wt 151.0 lb

## 2019-11-19 DIAGNOSIS — Z91038 Other insect allergy status: Secondary | ICD-10-CM

## 2019-11-19 DIAGNOSIS — T783XXD Angioneurotic edema, subsequent encounter: Secondary | ICD-10-CM | POA: Diagnosis not present

## 2019-11-19 DIAGNOSIS — L509 Urticaria, unspecified: Secondary | ICD-10-CM

## 2019-11-19 MED ORDER — HYDROCORTISONE 2.5 % EX LOTN: TOPICAL_LOTION | Freq: Two times a day (BID) | CUTANEOUS | 0 refills | Status: DC

## 2019-11-19 NOTE — Progress Notes (Signed)
Subjective:    Patient ID: Amy Lin, female    DOB: 1933-11-14, 86 y.o.   MRN: [id number]  HPI  Pt presents to the clinic today for ER follow up. She went to the ER 11/15/19 after being bitten by ants in the [facility]. Shortly after, she developed hives, swelling of her left upper lip and eyelid. ECG was normal. She was treated with Epinephrine, Benadryl, Solu Medrol and Famotidine. She was discharged, advised to follow up with her PCP. Since discharge, she reports the swelling has resolved. She denies discomfort in the areas that the bites were. She reports all her symptoms have resolved.  Review of Systems  Past Medical History:  Diagnosis Date  . Allergy   . GERD (gastroesophageal reflux disease)   . Hyperlipidemia   . Hypertension    diuretic for edema at first  . Hypothyroidism   . Osteoarthritis, multiple sites   . Osteoporosis     Current Outpatient Medications  Medication Sig Dispense Refill  . aspirin 81 MG EC tablet Take 81 mg by mouth daily.      . cetirizine (ZYRTEC) 10 MG tablet Take 10 mg by mouth daily.      . Cholecalciferol (VITAMIN D3) 2000 UNITS TABS Take by mouth.      . CRESTOR 20 MG tablet Take 1 tablet (20 mg total) by mouth daily. 90 tablet 1  . Cyanocobalamin (VITAMIN B 12 PO) Take by mouth daily.    Marland Kitchen levothyroxine (SYNTHROID) 75 MCG tablet TAKE 1 TABLET BY MOUTH ONCE A DAY ON AN EMPTY STOMACH.TAKE WITH A GLASS OF WATERAT LEAST 30-60 MIN BEFORE BREAKFAST 90 tablet 0  . lisinopril (ZESTRIL) 20 MG tablet TAKE 1 TABLET BY MOUTH ONCE A DAY 90 tablet 1  . OVER THE COUNTER MEDICATION Take 1 capsule by mouth daily. Probiotic    . pantoprazole (PROTONIX) 20 MG tablet TAKE 1 TABLET BY MOUTH TWICE A DAY 180 tablet 2  . predniSONE (DELTASONE) 10 MG tablet Take 3 tabs on days 1-2, take 2 tabs on days 3-4, take 1 tab on days 5-6 12 tablet 0  . predniSONE (DELTASONE) 20 MG tablet Take 2 tablets (40 mg total) by mouth daily with breakfast. 8 tablet 0    No current facility-administered medications for this visit.    No Known Allergies  Family History  Problem Relation Age of Onset  . Heart disease Mother   . Stroke Father   . Hypertension Father   . Heart disease Father   . Diabetes Father     Social History   Socioeconomic History  . Marital status: Divorced    Spouse name: Not on file  . Number of children: 3  . Years of education: Not on file  . Highest education level: Not on file  Occupational History  . Occupation: Audiological scientist    Comment: [employer]  Tobacco Use  . Smoking status: Never Smoker  . Smokeless tobacco: Never Used  Substance and Sexual Activity  . Alcohol use: No  . Drug use: No  . Sexual activity: Never  Other Topics Concern  . Not on file  Social History Narrative   3 children--- 1 died   Lives alone      No living will   Requests son and daughter as health care POAs   Would accept resuscitation attempts but no prolonged artificial ventilation   Not sure about tube feeds   Social Determinants of Health   Financial Resource Strain:   .  Difficulty of Paying Living Expenses: Not on file  Food Insecurity:   . Worried About Charity fundraiser in the Last Year: Not on file  . Ran Out of Food in the Last Year: Not on file  Transportation Needs:   . Lack of Transportation (Medical): Not on file  . Lack of Transportation (Non-Medical): Not on file  Physical Activity:   . Days of Exercise per Week: Not on file  . Minutes of Exercise per Session: Not on file  Stress:   . Feeling of Stress : Not on file  Social Connections:   . Frequency of Communication with Friends and Family: Not on file  . Frequency of Social Gatherings with Friends and Family: Not on file  . Attends Religious Services: Not on file  . Active Member of Clubs or Organizations: Not on file  . Attends Archivist Meetings: Not on file  . Marital Status: Not on file  Intimate Partner Violence:   . Fear of  Current or Ex-Partner: Not on file  . Emotionally Abused: Not on file  . Physically Abused: Not on file  . Sexually Abused: Not on file     Constitutional: Denies fever, malaise, fatigue, headache or abrupt weight changes.  HEENT: Denies eye pain, eye redness, ear pain, ringing in the ears, wax buildup, runny nose, nasal congestion, bloody nose, or sore throat. Respiratory: Denies difficulty breathing, shortness of breath, cough or sputum production.   Cardiovascular: Denies chest pain, chest tightness, palpitations or swelling in the hands or feet.  Skin: Denies redness, rashes, lesions or ulcercations.   No other specific complaints in a complete review of systems (except as listed in HPI above).     Objective:   Physical Exam BP (!) 142/82   Pulse 72   Temp 98.4 F (36.9 C) (Temporal)   Wt 151 lb (68.5 kg)   SpO2 98%   BMI 26.41 kg/m   Wt Readings from Last 3 Encounters:  11/15/19 145 lb (65.8 kg)  09/25/19 147 lb (66.7 kg)  08/02/19 147 lb (66.7 kg)    General: Appears her stated age, well developed, well nourished in NAD. Skin: Warm, dry and intact. Pustule noted of left palm. HEENT: Head: normal shape and size; Eyes: sclera white, no icterus, conjunctiva pink, PERRLA and EOMs intact;  Cardiovascular: Normal rate and rhythm. S1,S2 noted.  No murmur, rubs or gallops noted.  Pulmonary/Chest: Normal effort and positive vesicular breath sounds. No respiratory distress. No wheezes, rales or ronchi noted.  Neurological: Alert and oriented.    BMET    Component Value Date/Time   NA 140 09/25/2019 1458   K 4.3 09/25/2019 1458   CL 104 09/25/2019 1458   CO2 28 09/25/2019 1458   GLUCOSE 95 09/25/2019 1458   BUN 17 09/25/2019 1458   CREATININE 0.88 09/25/2019 1458   CREATININE 0.91 (H) 07/19/2019 1613   CALCIUM 9.5 09/25/2019 1458   GFRNONAA 68.59 12/04/2009 1131   GFRAA 91 03/12/2008 0928    Lipid Panel     Component Value Date/Time   CHOL 194 09/25/2019 1458    TRIG 107.0 09/25/2019 1458   TRIG 103 12/06/2005 1055   HDL 53.40 09/25/2019 1458   CHOLHDL 4 09/25/2019 1458   VLDL 21.4 09/25/2019 1458   LDLCALC 119 (H) 09/25/2019 1458   LDLCALC 207 (H) 07/19/2019 1613    CBC    Component Value Date/Time   WBC 7.0 09/25/2019 1458   RBC 4.05 09/25/2019 1458  HGB 12.7 09/25/2019 1458   HCT 37.7 09/25/2019 1458   PLT 180.0 09/25/2019 1458   MCV 93.2 09/25/2019 1458   MCH 30.4 07/15/2016 1659   MCHC 33.7 09/25/2019 1458   RDW 13.8 09/25/2019 1458   LYMPHSABS 1,007 07/15/2016 1659   MONOABS 848 07/15/2016 1659   EOSABS 53 07/15/2016 1659   BASOSABS 53 07/15/2016 1659    Hgb A1C Lab Results  Component Value Date   HGBA1C 6.3 09/25/2019             Assessment & Plan:   ER Follow Up for Urticaria, Angioedema secondary to Ant Bite:  ER notes and procedures reviewed No further intervention needed at this time She will keep Benadryl on her in case of another reaction She has an Epi pen if needed Will monitor for now She would like a refill of Hydrocortisone 2.5% lotion, refilled today  Return precautions discussed Webb Silversmith, NP This visit occurred during the SARS-CoV-2 public health emergency.  Safety protocols were in place, including screening questions prior to the visit, additional usage of staff PPE, and extensive cleaning of exam room while observing appropriate contact time as indicated for disinfecting solutions.

## 2019-11-19 NOTE — Patient Instructions (Signed)
Angioedema  Angioedema is sudden swelling in the body. The swelling can happen in any part of the body. It often happens on the skin and causes itchy, bumpy patches (hives) to form. This condition may:  Happen only one time.  Happen more than one time. It may come back at random times.  Keep coming back for a number of years. Someday it may stop coming back. Follow these instructions at home:  Take over-the-counter and prescription medicines only as told by your doctor.  If you were given medicines for emergency allergy treatment, always carry them with you.  Wear a medical bracelet as told by your doctor.  Avoid the things that cause your attacks (triggers).  If this condition was passed to you from your parents and you want to have kids, talk to your doctor. Your kids may also have this condition. Contact a doctor if:  You have another attack.  Your attacks happen more often, even after you take steps to prevent them.  This condition was passed to you by your parents and you want to have kids. Get help right away if:  Your mouth, tongue, or lips get very swollen.  You have trouble breathing.  You have trouble swallowing.  You pass out (faint). This information is not intended to replace advice given to you by your health care provider. Make sure you discuss any questions you have with your health care provider. Document Revised: 12/30/2016 Document Reviewed: 07/28/2015 Elsevier Patient Education  2020 Elsevier Inc.  

## 2019-12-14 DIAGNOSIS — J309 Allergic rhinitis, unspecified: Secondary | ICD-10-CM | POA: Diagnosis not present

## 2019-12-14 DIAGNOSIS — Z20822 Contact with and (suspected) exposure to covid-19: Secondary | ICD-10-CM | POA: Diagnosis not present

## 2019-12-14 DIAGNOSIS — R058 Other specified cough: Secondary | ICD-10-CM | POA: Diagnosis not present

## 2019-12-16 ENCOUNTER — Telehealth: Payer: Self-pay

## 2019-12-16 NOTE — Telephone Encounter (Signed)
Contacted pt who is c/o productive cough with yellow phlegm, sore throat, runny nose, chest tightness with cough, and slightly elevated temp for 7 days. Pt denies HA, SOB, and any changes in taste or smell. Pt seen at [facility] on Saturday and tested for covid and was negative and told her lungs were clear, afebrile and no irritation in throat. Pt was prescribed bromphen/dxtro/pseudo, take 77mL, Q12H, and also use nasal saline as needed.  Pt reports she is only feeling slightly better today and was thinking of f/u with this clinic tomorrow morning if she did not feel better by then. Pt is staying well hydrated and rested. Advised pt if any symptoms worsen or she develops any new symptoms to contact office. Pt verbalized understanding.   Requested office note and covid result from [facility].

## 2019-12-16 NOTE — Telephone Encounter (Signed)
[place] Night - Client TELEPHONE ADVICE RECORD [facility] Patient Name: Amy Lin Gender: Female DOB: 1933-07-04 Age: 86 Y 10 M 13 D Return Phone Number: [phone] (Primary) Address: City/State/Zip: [name] [address] Client [place] Night - Client Client Site [facility] Physician Webb Silversmith - NP Contact Type Call Who Is Calling Patient / Member / Family / Caregiver Call Type Triage / Clinical Relationship To Patient Self Return Phone Number [phone] (Primary) Chief Complaint Cough Reason for Call Symptomatic / Request for [name] states she has a runny nose and cough. Translation No Nurse Assessment Nurse: Jearld Pies, RN, Lovena Le Date/Time [name] Time): 12/14/2019 9:28:14 AM Confirm and document reason for call. If symptomatic, describe symptoms. ---Pt has been having runny nose since the first of the week. Productive cough started yesterday. 99.29F temporal currently (highest). Eating, drinking, and urinating normally. Denies fever, difficulty breathing, or any other symptoms at this time. Does the patient have any new or worsening symptoms? ---Yes Will a triage be completed? ---Yes Related visit to physician within the last 2 weeks? ---No Does the PT have any chronic conditions? (i.e. diabetes, asthma, this includes High risk factors for pregnancy, etc.) ---Yes List chronic conditions. ---HTN Is this a behavioral health or substance abuse call? ---No Guidelines Guideline Title Affirmed Question Affirmed Notes Nurse Date/Time ([place] Time) COVID-19 - Diagnosed or Suspected Chest pain or pressure Jearld Pies, RNLovena Le 12/14/2019 9:30:39 AM Disp. Time [name] Time) Disposition Final User 12/14/2019 9:41:50 AM Go to ED Now (or PCP triage) Yes Jearld Pies, RN, Apolonio Schneiders Disagree/Comply Comply Caller Understands Yes PreDisposition Home Care PLEASE  NOTE: All timestamps contained within this report are represented as [place] Standard Time. CONFIDENTIALTY NOTICE: This fax transmission is intended only for the addressee. It contains information that is legally privileged, confidential or otherwise protected from use or disclosure. If you are not the intended recipient, you are strictly prohibited from reviewing, disclosing, copying using or disseminating any of this information or taking any action in reliance on or regarding this information. If you have received this fax in error, please notify us immediately by telephone so that we can arrange for its return to [place]. Phone: [phone], Toll-Free: [phone], Fax: [phone] Page: 2 of 2 Call Id: [id number] Care Advice Given Per Guideline GO TO ED NOW (OR PCP TRIAGE): * Fever: For fever over 101 F (38.3 C), take acetaminophen every 4 to 6 hours (Adults 650 mg) OR ibuprofen every 6 to 8 hours (Adults 400 mg). Before taking any medicine, read all the instructions on the package. Do not take aspirin unless your doctor has prescribed it for you. GENERAL CARE ADVICE FOR COVID-19 SYMPTOMS: * The treatment is the same whether you have COVID-19, influenza or some other respiratory virus. Referrals [facility] GO TO FACILITY UNDECIDED

## 2019-12-16 NOTE — Telephone Encounter (Signed)
noted 

## 2019-12-18 DIAGNOSIS — J209 Acute bronchitis, unspecified: Secondary | ICD-10-CM | POA: Diagnosis not present

## 2019-12-18 DIAGNOSIS — I10 Essential (primary) hypertension: Secondary | ICD-10-CM | POA: Diagnosis not present

## 2019-12-18 DIAGNOSIS — Z20822 Contact with and (suspected) exposure to covid-19: Secondary | ICD-10-CM | POA: Diagnosis not present

## 2019-12-18 DIAGNOSIS — R059 Cough, unspecified: Secondary | ICD-10-CM | POA: Diagnosis not present

## 2019-12-18 NOTE — Telephone Encounter (Signed)
[facility] - Client TELEPHONE ADVICE RECORD [facility] Patient Name: Amy Lin Gender: Female DOB: 1933/10/28 Age: 86 Y 43 M 17 D Return Phone Number: [phone] (Primary), [phone] (Secondary) [place] [address] Client [facility] - Client Client Site [name] - Day Physician Webb Silversmith - NP Contact Type Call Who Is Calling Patient / Member / Family / Caregiver Call Type Triage / Clinical Relationship To Patient Self Return Phone Number [phone] (Primary) Chief Complaint Cough Reason for Call Symptomatic / Request for [place] states that she called in over the weekend and her symptoms are worsening. She is still experiencing congestion and drainage. The cough medication is not assisting with the cough either. Answering service rep, Raquel Sarna, unable to transfer patient but confirmed information. Translation No Nurse Assessment Nurse: Micki Riley, RN, [name] Date/Time (Eastern Time): 12/18/2019 11:45:32 AM Confirm and document reason for call. If symptomatic, describe symptoms. ---Caller states she has been having a productive cough, congestion, & drainage; first noted Last Tuesday. any fever or any other symptoms. Was seen at [facility] on Saturday, was prescribed cough syrup bid (guanfesin, pseudoephedrine, dextromethorphan). Was COVID tested & was negative. Does the patient have any new or worsening symptoms? ---Yes Will a triage be completed? ---Yes Related visit to physician within the last 2 weeks? ---Yes Does the PT have any chronic conditions? (i.e. diabetes, asthma, this includes High risk factors for pregnancy, etc.) ---Unknown Is this a behavioral health or substance abuse call? ---No Guidelines Guideline Title Affirmed Question Affirmed Notes Nurse Date/Time (Eastern Time) Cough - Acute Productive [1] MILD  difficulty breathing (e.g., minimal/ no SOB at rest, SOB with walking, pulse <100) AND [2] still present when not coughing Cazares, RN, Sanjuanita Janie 12/18/2019 11:52:08 AM Disp. Time [name] Time) Disposition Final User PLEASE NOTE: All timestamps contained within this report are represented as [place] Standard Time. CONFIDENTIALTY NOTICE: This fax transmission is intended only for the addressee. It contains information that is legally privileged, confidential or otherwise protected from use or disclosure. If you are not the intended recipient, you are strictly prohibited from reviewing, disclosing, copying using or disseminating any of this information or taking any action in reliance on or regarding this information. If you have received this fax in error, please notify us immediately by telephone so that we can arrange for its return to [place]. Phone: [phone], Toll-Free: [phone], Fax: [phone] Page: 2 of 2 Call Id: [id number] 12/18/2019 11:55:32 AM See HCP within 4 Hours (or PCP triage) Yes Micki Riley, RN, [name] Caller Disagree/Comply Comply Caller Understands Yes PreDisposition InappropriateToAsk Care Advice Given Per Guideline SEE HCP (OR PCP TRIAGE) WITHIN 4 HOURS: * IF OFFICE WILL BE OPEN: You need to be seen within the next 3 or 4 hours. Call your doctor (or NP/PA) now or as soon as the office opens. BRING MEDICINES: CALL BACK IF: * You become worse CARE ADVICE given per Cough - Acute Productive (Adult) guideline. Comments User: [name], Micki Riley, RN Date/Time [name] Time): 12/18/2019 11:53:20 AM Has a head pressure & mild fever. Denies any other symptoms. User: [name], Micki Riley, RN Date/Time [name] Time): 12/18/2019 11:59:01 AM Spoke with Raquel Sarna [phone] & explained patient needs a 4hr appointment due to their symptoms. Raquel Sarna states they do not have an appointments available for this time frame. Requests patient go to UC to be  revaluated. Will relay information to caller. Referrals GO TO FACILITY UNDECIDED REFERRED TO  PCP OFFICE

## 2019-12-18 NOTE — Telephone Encounter (Signed)
Pt notified as instructed and pt voiced understanding. Pt will go to UC as instructed.

## 2019-12-18 NOTE — Telephone Encounter (Signed)
I spoke with Amy Lin; Amy Lin is having burning feeling in chest when coughs; prod cough with pale yellow, a lot of chest congestion, No CP and no wheezing. Temp is 99.2  Amy Lin is going to think about if she will go to UC; Amy Lin said has a lot of congestion in chest and does not know if needs CXR or not. Amy Lin wanted to know why she could not come into office to be seen. Explained guidelines for in office visits to Amy Lin and Amy Lin voiced understanding. Sending note to Avie Echevaria NP for further instructions.

## 2019-12-18 NOTE — Telephone Encounter (Signed)
Recommend UC at this time

## 2020-01-01 DIAGNOSIS — K648 Other hemorrhoids: Secondary | ICD-10-CM | POA: Diagnosis not present

## 2020-01-01 DIAGNOSIS — K625 Hemorrhage of anus and rectum: Secondary | ICD-10-CM | POA: Diagnosis not present

## 2020-01-17 ENCOUNTER — Other Ambulatory Visit: Payer: Self-pay | Admitting: Internal Medicine

## 2020-01-17 DIAGNOSIS — E039 Hypothyroidism, unspecified: Secondary | ICD-10-CM

## 2020-01-18 ENCOUNTER — Other Ambulatory Visit: Payer: Self-pay | Admitting: Internal Medicine

## 2020-01-18 DIAGNOSIS — E039 Hypothyroidism, unspecified: Secondary | ICD-10-CM

## 2020-02-10 ENCOUNTER — Other Ambulatory Visit: Payer: Self-pay | Admitting: Internal Medicine

## 2020-03-05 ENCOUNTER — Inpatient Hospital Stay (HOSPITAL_COMMUNITY): Payer: PPO

## 2020-03-05 ENCOUNTER — Other Ambulatory Visit: Payer: Self-pay

## 2020-03-05 ENCOUNTER — Encounter (HOSPITAL_COMMUNITY)
Admission: RE | Disposition: A | Discharge status: Home / Self Care | Payer: Self-pay | Source: Home / Self Care | Attending: Cardiology

## 2020-03-05 ENCOUNTER — Encounter (HOSPITAL_COMMUNITY): Payer: Self-pay | Admitting: Cardiology

## 2020-03-05 ENCOUNTER — Inpatient Hospital Stay (HOSPITAL_COMMUNITY)
Admission: RE | Admit: 2020-03-05 | Discharge: 2020-03-07 | DRG: 247 | Disposition: A | Discharge status: Home / Self Care | Payer: PPO | Attending: Cardiology | Admitting: Cardiology

## 2020-03-05 DIAGNOSIS — Z955 Presence of coronary angioplasty implant and graft: Secondary | ICD-10-CM

## 2020-03-05 DIAGNOSIS — K219 Gastro-esophageal reflux disease without esophagitis: Secondary | ICD-10-CM | POA: Diagnosis present

## 2020-03-05 DIAGNOSIS — I213 ST elevation (STEMI) myocardial infarction of unspecified site: Secondary | ICD-10-CM | POA: Diagnosis not present

## 2020-03-05 DIAGNOSIS — Z79890 Hormone replacement therapy (postmenopausal): Secondary | ICD-10-CM

## 2020-03-05 DIAGNOSIS — Z20822 Contact with and (suspected) exposure to covid-19: Secondary | ICD-10-CM | POA: Diagnosis not present

## 2020-03-05 DIAGNOSIS — I200 Unstable angina: Secondary | ICD-10-CM

## 2020-03-05 DIAGNOSIS — R110 Nausea: Secondary | ICD-10-CM | POA: Diagnosis present

## 2020-03-05 DIAGNOSIS — I2119 ST elevation (STEMI) myocardial infarction involving other coronary artery of inferior wall: Secondary | ICD-10-CM | POA: Diagnosis not present

## 2020-03-05 DIAGNOSIS — Z833 Family history of diabetes mellitus: Secondary | ICD-10-CM

## 2020-03-05 DIAGNOSIS — M810 Age-related osteoporosis without current pathological fracture: Secondary | ICD-10-CM | POA: Diagnosis not present

## 2020-03-05 DIAGNOSIS — Z8249 Family history of ischemic heart disease and other diseases of the circulatory system: Secondary | ICD-10-CM | POA: Diagnosis not present

## 2020-03-05 DIAGNOSIS — I25110 Atherosclerotic heart disease of native coronary artery with unstable angina pectoris: Secondary | ICD-10-CM | POA: Diagnosis not present

## 2020-03-05 DIAGNOSIS — I2584 Coronary atherosclerosis due to calcified coronary lesion: Secondary | ICD-10-CM | POA: Diagnosis not present

## 2020-03-05 DIAGNOSIS — R000 Tachycardia, unspecified: Secondary | ICD-10-CM | POA: Diagnosis not present

## 2020-03-05 DIAGNOSIS — Z9861 Coronary angioplasty status: Secondary | ICD-10-CM | POA: Diagnosis not present

## 2020-03-05 DIAGNOSIS — E782 Mixed hyperlipidemia: Secondary | ICD-10-CM | POA: Diagnosis not present

## 2020-03-05 DIAGNOSIS — Z823 Family history of stroke: Secondary | ICD-10-CM | POA: Diagnosis not present

## 2020-03-05 DIAGNOSIS — I499 Cardiac arrhythmia, unspecified: Secondary | ICD-10-CM | POA: Diagnosis not present

## 2020-03-05 DIAGNOSIS — M159 Polyosteoarthritis, unspecified: Secondary | ICD-10-CM | POA: Diagnosis present

## 2020-03-05 DIAGNOSIS — Z79899 Other long term (current) drug therapy: Secondary | ICD-10-CM

## 2020-03-05 DIAGNOSIS — E039 Hypothyroidism, unspecified: Secondary | ICD-10-CM | POA: Diagnosis not present

## 2020-03-05 DIAGNOSIS — I2583 Coronary atherosclerosis due to lipid rich plaque: Secondary | ICD-10-CM | POA: Diagnosis present

## 2020-03-05 DIAGNOSIS — I214 Non-ST elevation (NSTEMI) myocardial infarction: Secondary | ICD-10-CM | POA: Diagnosis not present

## 2020-03-05 DIAGNOSIS — R0789 Other chest pain: Secondary | ICD-10-CM | POA: Diagnosis not present

## 2020-03-05 DIAGNOSIS — R079 Chest pain, unspecified: Secondary | ICD-10-CM | POA: Diagnosis not present

## 2020-03-05 DIAGNOSIS — Z7982 Long term (current) use of aspirin: Secondary | ICD-10-CM | POA: Diagnosis not present

## 2020-03-05 DIAGNOSIS — I2510 Atherosclerotic heart disease of native coronary artery without angina pectoris: Secondary | ICD-10-CM | POA: Diagnosis not present

## 2020-03-05 DIAGNOSIS — I10 Essential (primary) hypertension: Secondary | ICD-10-CM | POA: Diagnosis not present

## 2020-03-05 HISTORY — PX: CORONARY/GRAFT ACUTE MI REVASCULARIZATION: CATH118305

## 2020-03-05 HISTORY — DX: Atherosclerotic heart disease of native coronary artery without angina pectoris: I25.10

## 2020-03-05 HISTORY — DX: Acute myocardial infarction, unspecified: I21.9

## 2020-03-05 HISTORY — PX: LEFT HEART CATH AND CORONARY ANGIOGRAPHY: CATH118249

## 2020-03-05 HISTORY — PX: CORONARY THROMBECTOMY: CATH118304

## 2020-03-05 HISTORY — PX: CARDIAC CATHETERIZATION: SHX172

## 2020-03-05 LAB — COMPREHENSIVE METABOLIC PANEL
ALT: 16 U/L (ref 0–44)
AST: 22 U/L (ref 15–41)
Albumin: 3.4 g/dL — ABNORMAL LOW (ref 3.5–5.0)
Alkaline Phosphatase: 51 U/L (ref 38–126)
Anion gap: 12 (ref 5–15)
BUN: 14 mg/dL (ref 8–23)
CO2: 21 mmol/L — ABNORMAL LOW (ref 22–32)
Calcium: 9.4 mg/dL (ref 8.9–10.3)
Chloride: 106 mmol/L (ref 98–111)
Creatinine, Ser: 0.8 mg/dL (ref 0.44–1.00)
GFR, Estimated: >60 mL/min (ref >60)
Glucose, Bld: 129 mg/dL — ABNORMAL HIGH (ref 70–99)
Potassium: 3.7 mmol/L (ref 3.5–5.1)
Sodium: 139 mmol/L (ref 135–145)
Total Bilirubin: 0.7 mg/dL (ref 0.3–1.2)
Total Protein: 6.8 g/dL (ref 6.5–8.1)

## 2020-03-05 LAB — TROPONIN I (HIGH SENSITIVITY)
Troponin I (High Sensitivity): 131 ng/L (ref <18)
Troponin I (High Sensitivity): 4543 ng/L (ref <18)

## 2020-03-05 LAB — CBC
HCT: 36.1 % (ref 36.0–46.0)
Hemoglobin: 12.8 g/dL (ref 12.0–15.0)
MCH: 32.1 pg (ref 26.0–34.0)
MCHC: 35.5 g/dL (ref 30.0–36.0)
MCV: 90.5 fL (ref 80.0–100.0)
Platelets: 169 10*3/uL (ref 150–400)
RBC: 3.99 MIL/uL (ref 3.87–5.11)
RDW: 12.2 % (ref 11.5–15.5)
WBC: 9.6 10*3/uL (ref 4.0–10.5)
nRBC: 0 % (ref 0.0–0.2)

## 2020-03-05 LAB — POCT I-STAT, CHEM 8
BUN: 17 mg/dL (ref 8–23)
Calcium, Ion: 1.2 mmol/L (ref 1.15–1.40)
Chloride: 107 mmol/L (ref 98–111)
Creatinine, Ser: 0.7 mg/dL (ref 0.44–1.00)
Glucose, Bld: 137 mg/dL — ABNORMAL HIGH (ref 70–99)
HCT: 38 % (ref 36.0–46.0)
Hemoglobin: 12.9 g/dL (ref 12.0–15.0)
Potassium: 3.7 mmol/L (ref 3.5–5.1)
Sodium: 139 mmol/L (ref 135–145)
TCO2: 22 mmol/L (ref 22–32)

## 2020-03-05 LAB — LIPID PANEL
Cholesterol: 194 mg/dL (ref 0–200)
HDL: 51 mg/dL (ref >40)
LDL Cholesterol: 114 mg/dL — ABNORMAL HIGH (ref 0–99)
Total CHOL/HDL Ratio: 3.8 RATIO
Triglycerides: 146 mg/dL (ref <150)
VLDL: 29 mg/dL (ref 0–40)

## 2020-03-05 LAB — SARS CORONAVIRUS 2 BY RT PCR (HOSPITAL ORDER, PERFORMED IN ~~LOC~~ HOSPITAL LAB): SARS Coronavirus 2: NEGATIVE

## 2020-03-05 LAB — TSH: TSH: 2.002 u[IU]/mL (ref 0.350–4.500)

## 2020-03-05 LAB — POCT ACTIVATED CLOTTING TIME
Activated Clotting Time: 220 seconds
Activated Clotting Time: 231 seconds
Activated Clotting Time: 244 seconds

## 2020-03-05 LAB — GLUCOSE, CAPILLARY: Glucose-Capillary: 123 mg/dL — ABNORMAL HIGH (ref 70–99)

## 2020-03-05 SURGERY — LEFT HEART CATH AND CORONARY ANGIOGRAPHY
Anesthesia: LOCAL

## 2020-03-05 MED ORDER — ONDANSETRON HCL 4 MG/2ML IJ SOLN
4.0000 mg | Freq: Four times a day (QID) | INTRAMUSCULAR | Status: DC | PRN
  Administered 2020-03-05: 4 mg via INTRAVENOUS
  Filled 2020-03-05: qty 2

## 2020-03-05 MED ORDER — ONDANSETRON HCL 4 MG/2ML IJ SOLN
INTRAMUSCULAR | Status: AC
  Filled 2020-03-05: qty 2

## 2020-03-05 MED ORDER — SODIUM CHLORIDE 0.9 % IV SOLN: 250.0000 mL | INTRAVENOUS | Status: DC | PRN

## 2020-03-05 MED ORDER — TICAGRELOR 90 MG PO TABS
ORAL_TABLET | ORAL | Status: DC | PRN
  Administered 2020-03-05: 180 mg via ORAL

## 2020-03-05 MED ORDER — VERAPAMIL HCL 2.5 MG/ML IV SOLN
INTRAVENOUS | Status: DC | PRN
  Administered 2020-03-05: 50 mg via INTRACORONARY
  Administered 2020-03-05: 50 ug via INTRACORONARY

## 2020-03-05 MED ORDER — NITROGLYCERIN 1 MG/10 ML FOR IR/CATH LAB
INTRA_ARTERIAL | Status: DC | PRN
  Administered 2020-03-05 (×2): 200 ug

## 2020-03-05 MED ORDER — NITROGLYCERIN 1 MG/10 ML FOR IR/CATH LAB
INTRA_ARTERIAL | Status: AC
  Filled 2020-03-05: qty 10

## 2020-03-05 MED ORDER — IOHEXOL 350 MG/ML SOLN
INTRAVENOUS | Status: AC
  Filled 2020-03-05: qty 1

## 2020-03-05 MED ORDER — TICAGRELOR 90 MG PO TABS
90.0000 mg | ORAL_TABLET | Freq: Two times a day (BID) | ORAL | Status: DC
  Administered 2020-03-05 – 2020-03-07 (×4): 90 mg via ORAL
  Filled 2020-03-05 (×4): qty 1

## 2020-03-05 MED ORDER — FAMOTIDINE IN NACL 20-0.9 MG/50ML-% IV SOLN
20.0000 mg | Freq: Once | INTRAVENOUS | Status: AC
  Administered 2020-03-05: 20 mg via INTRAVENOUS
  Filled 2020-03-05: qty 50

## 2020-03-05 MED ORDER — NITROGLYCERIN IN D5W 200-5 MCG/ML-% IV SOLN
INTRAVENOUS | Status: AC
  Filled 2020-03-05: qty 250

## 2020-03-05 MED ORDER — CHLORHEXIDINE GLUCONATE CLOTH 2 % EX PADS
6.0000 | MEDICATED_PAD | Freq: Every day | CUTANEOUS | Status: DC
  Administered 2020-03-06: 6 via TOPICAL

## 2020-03-05 MED ORDER — LIDOCAINE HCL (PF) 1 % IJ SOLN
INTRAMUSCULAR | Status: DC | PRN
  Administered 2020-03-05: 2 mL

## 2020-03-05 MED ORDER — LIDOCAINE HCL (PF) 1 % IJ SOLN
INTRAMUSCULAR | Status: AC
  Filled 2020-03-05: qty 30

## 2020-03-05 MED ORDER — HEPARIN (PORCINE) IN NACL 1000-0.9 UT/500ML-% IV SOLN
INTRAVENOUS | Status: AC
  Filled 2020-03-05: qty 1000

## 2020-03-05 MED ORDER — ROSUVASTATIN CALCIUM 20 MG PO TABS
20.0000 mg | ORAL_TABLET | Freq: Every day | ORAL | Status: DC
  Administered 2020-03-06: 20 mg via ORAL
  Filled 2020-03-05: qty 1

## 2020-03-05 MED ORDER — HEPARIN (PORCINE) IN NACL 1000-0.9 UT/500ML-% IV SOLN
INTRAVENOUS | Status: DC | PRN
  Administered 2020-03-05 (×3): 500 mL

## 2020-03-05 MED ORDER — VERAPAMIL HCL 2.5 MG/ML IV SOLN
INTRAVENOUS | Status: DC | PRN
  Administered 2020-03-05: 5 mL via INTRA_ARTERIAL

## 2020-03-05 MED ORDER — NITROGLYCERIN IN D5W 200-5 MCG/ML-% IV SOLN
20.0000 ug/min | INTRAVENOUS | Status: DC
  Administered 2020-03-05: 20 ug/min via INTRAVENOUS

## 2020-03-05 MED ORDER — TICAGRELOR 90 MG PO TABS
ORAL_TABLET | ORAL | Status: AC
  Filled 2020-03-05: qty 2

## 2020-03-05 MED ORDER — SODIUM CHLORIDE 0.9 % IV SOLN
INTRAVENOUS | Status: AC | PRN
  Administered 2020-03-05: 1000 mL via INTRAVENOUS

## 2020-03-05 MED ORDER — HEPARIN SODIUM (PORCINE) 1000 UNIT/ML IJ SOLN
INTRAMUSCULAR | Status: AC
  Filled 2020-03-05: qty 1

## 2020-03-05 MED ORDER — ASPIRIN EC 81 MG PO TBEC
81.0000 mg | DELAYED_RELEASE_TABLET | Freq: Every day | ORAL | Status: DC
  Administered 2020-03-06: 81 mg via ORAL
  Filled 2020-03-05: qty 1

## 2020-03-05 MED ORDER — METOPROLOL TARTRATE 12.5 MG HALF TABLET
12.5000 mg | ORAL_TABLET | Freq: Two times a day (BID) | ORAL | Status: DC
  Administered 2020-03-05 – 2020-03-06 (×4): 12.5 mg via ORAL
  Filled 2020-03-05 (×4): qty 1

## 2020-03-05 MED ORDER — SODIUM CHLORIDE 0.9% FLUSH: 3.0000 mL | Freq: Two times a day (BID) | INTRAVENOUS | Status: DC

## 2020-03-05 MED ORDER — HEPARIN (PORCINE) IN NACL 1000-0.9 UT/500ML-% IV SOLN
INTRAVENOUS | Status: AC
  Filled 2020-03-05: qty 500

## 2020-03-05 MED ORDER — HEPARIN SODIUM (PORCINE) 1000 UNIT/ML IJ SOLN
INTRAMUSCULAR | Status: DC | PRN
  Administered 2020-03-05: 10000 [IU] via INTRAVENOUS
  Administered 2020-03-05: 1000 [IU] via INTRAVENOUS

## 2020-03-05 MED ORDER — POTASSIUM CHLORIDE CRYS ER 20 MEQ PO TBCR
20.0000 meq | EXTENDED_RELEASE_TABLET | ORAL | Status: AC
  Administered 2020-03-05 – 2020-03-06 (×2): 20 meq via ORAL
  Filled 2020-03-05 (×2): qty 1

## 2020-03-05 MED ORDER — SODIUM CHLORIDE 0.9% FLUSH: 3.0000 mL | INTRAVENOUS | Status: DC | PRN

## 2020-03-05 MED ORDER — VERAPAMIL HCL 2.5 MG/ML IV SOLN
INTRAVENOUS | Status: AC
  Filled 2020-03-05: qty 2

## 2020-03-05 MED ORDER — ONDANSETRON HCL 4 MG/2ML IJ SOLN
INTRAMUSCULAR | Status: DC | PRN
  Administered 2020-03-05 (×2): 4 mg via INTRAVENOUS

## 2020-03-05 MED ORDER — IOHEXOL 350 MG/ML SOLN
INTRAVENOUS | Status: DC | PRN
  Administered 2020-03-05: 100 mL

## 2020-03-05 MED ORDER — LEVOTHYROXINE SODIUM 75 MCG PO TABS
75.0000 ug | ORAL_TABLET | Freq: Every day | ORAL | Status: DC
  Administered 2020-03-06 – 2020-03-07 (×2): 75 ug via ORAL
  Filled 2020-03-05 (×2): qty 1

## 2020-03-05 MED ORDER — PANTOPRAZOLE SODIUM 20 MG PO TBEC
20.0000 mg | DELAYED_RELEASE_TABLET | Freq: Two times a day (BID) | ORAL | Status: DC
  Administered 2020-03-05 – 2020-03-06 (×3): 20 mg via ORAL
  Filled 2020-03-05 (×5): qty 1

## 2020-03-05 MED ORDER — LABETALOL HCL 5 MG/ML IV SOLN: 10.0000 mg | INTRAVENOUS | Status: AC | PRN

## 2020-03-05 MED ORDER — ACETAMINOPHEN 325 MG PO TABS
650.0000 mg | ORAL_TABLET | ORAL | Status: DC | PRN
  Administered 2020-03-05 (×2): 650 mg via ORAL
  Filled 2020-03-05 (×3): qty 2

## 2020-03-05 MED ORDER — SODIUM CHLORIDE 0.9 % WEIGHT BASED INFUSION
1.0000 mL/kg/h | INTRAVENOUS | Status: AC
  Administered 2020-03-05: 1 mL/kg/h via INTRAVENOUS

## 2020-03-05 MED ORDER — LISINOPRIL 20 MG PO TABS
20.0000 mg | ORAL_TABLET | Freq: Every day | ORAL | Status: DC
  Administered 2020-03-06: 20 mg via ORAL
  Filled 2020-03-05: qty 1

## 2020-03-05 SURGICAL SUPPLY — 16 items
BALLN SAPPHIRE 2.5X12 (BALLOONS) ×2
BALLOON SAPPHIRE 2.5X12 (BALLOONS) IMPLANT
CATH EXTRAC PRONTO 5.5F 138CM (CATHETERS) ×1 IMPLANT
CATH OPTITORQUE TIG 4.0 5F (CATHETERS) ×1 IMPLANT
CATH VISTA GUIDE 6FR JR4 (CATHETERS) ×1 IMPLANT
DEVICE RAD COMP TR BAND LRG (VASCULAR PRODUCTS) ×1 IMPLANT
GLIDESHEATH SLEND A-KIT 6F 22G (SHEATH) ×1 IMPLANT
GUIDEWIRE INQWIRE 1.5J.035X260 (WIRE) IMPLANT
INQWIRE 1.5J .035X260CM (WIRE) ×2
KIT ENCORE 26 ADVANTAGE (KITS) ×1 IMPLANT
KIT HEART LEFT (KITS) ×2 IMPLANT
PACK CARDIAC CATHETERIZATION (CUSTOM PROCEDURE TRAY) ×2 IMPLANT
STENT RESOLUTE ONYX 4.0X30 (Permanent Stent) ×1 IMPLANT
TRANSDUCER W/STOPCOCK (MISCELLANEOUS) ×2 IMPLANT
TUBING CIL FLEX 10 FLL-RA (TUBING) ×3 IMPLANT
WIRE COUGAR XT STRL 190CM (WIRE) ×1 IMPLANT

## 2020-03-05 NOTE — Progress Notes (Signed)
Pt started complaining of 9/10 centralized chest pain. EKG performed at bedside. MD Nadyne Coombes was paged and informed of situation. Verbal orders were given and placed. Will continue to monitor patient at this time.

## 2020-03-05 NOTE — H&P (Signed)
CARDIOLOGY ADMIT NOTE   Patient ID: Amy Lin MRN: [id number] DOB/AGE: February 16, 1933 87 y.o.  Admit date: 03/05/2020 Primary Physician:  [name], NP  Patient ID: Amy Lin, female    DOB: [birth year], 87 y.o.   MRN: [id number]  No chief complaint on file.  HPI:    Amy Lin  is a 87 y.o. Caucasian female patient presenting to the emergency room via EMS with active chest pain, EKG revealing inferior STEMI.  Patient's past medical history significant for hypertension, hyperlipidemia, GERD.  She was in usual state of health until last evening started having crushing retrosternal chest discomfort on and off throughout the evening and was very intense.  She took two aspirins and thought that she was having indigestion and as she started feeling better in the evening went to sleep.  She woke up at 630 this morning with recurrence of chest pain and as the pain got intense she activated EMS and was brought directly to the emergency room.  She feels associated nausea.  No shortness of breath.  No hemoptysis.  Denies any recent leg edema, PND or orthopnea.  She is a non-smoker.  She is not a diabetic.  Past Medical History:  Diagnosis Date  . Allergy   . GERD (gastroesophageal reflux disease)   . Hyperlipidemia   . Hypertension    diuretic for edema at first  . Hypothyroidism   . Osteoarthritis, multiple sites   . Osteoporosis    Past Surgical History:  Procedure Laterality Date  . ABDOMINAL HYSTERECTOMY    . BACK SURGERY  1999   Discectomy and Ray cage  . BLADDER REPAIR  ~2010  . CARPAL TUNNEL RELEASE  8/15  . COLONOSCOPY W/ BIOPSIES N/A 2016  . MENISCECTOMY Left 2/15   Dr Veverly Fells  . REFRACTIVE SURGERY Bilateral 07/2019  . UPPER GI ENDOSCOPY N/A Feb 2017   Social History   Socioeconomic History  . Marital status: Divorced    Spouse name: Not on file  . Number of children: 3  . Years of education: Not on file  . Highest education level: Not on file  Occupational  History  . Occupation: Audiological scientist    Comment: [employer]  Tobacco Use  . Smoking status: Never Smoker  . Smokeless tobacco: Never Used  Substance and Sexual Activity  . Alcohol use: No  . Drug use: No  . Sexual activity: Never  Other Topics Concern  . Not on file  Social History Narrative   3 children--- 1 died   Lives alone      No living will   Requests son and daughter as health care POAs   Would accept resuscitation attempts but no prolonged artificial ventilation   Not sure about tube feeds   Social Determinants of Health   Financial Resource Strain: Not on file  Food Insecurity: Not on file  Transportation Needs: Not on file  Physical Activity: Not on file  Stress: Not on file  Social Connections: Not on file  Intimate Partner Violence: Not on file   Family History  Problem Relation Age of Onset  . Heart disease Mother   . Stroke Father   . Hypertension Father   . Heart disease Father   . Diabetes Father     ROS  Review of Systems  Cardiovascular: Positive for chest pain. Negative for dyspnea on exertion and leg swelling.  Gastrointestinal: Positive for heartburn. Negative for melena.  All other systems reviewed and are negative.  Objective   Vitals with BMI 03/05/2020 03/05/2020 03/05/2020  Height - - -  Weight - - -  BMI - - -  Systolic 237 628 -  Diastolic 58 53 -  Pulse 53 63 59      Physical Exam Constitutional:      Appearance: Normal appearance. She is normal weight.  Eyes:     Extraocular Movements: Extraocular movements intact.  Cardiovascular:     Rate and Rhythm: Normal rate and regular rhythm.     Pulses: Intact distal pulses.     Heart sounds: Normal heart sounds. No murmur heard. No gallop.      Comments: She has 2+ pitting left ankle edema.  No tenderness.  No JVD. Pulmonary:     Effort: Pulmonary effort is normal.     Breath sounds: Normal breath sounds.  Abdominal:     General: Bowel sounds are normal.     Palpations:  Abdomen is soft.  Musculoskeletal:     Cervical back: Normal range of motion.  Skin:    General: Skin is warm and dry.     Capillary Refill: Capillary refill takes less than 2 seconds.  Neurological:     General: No focal deficit present.     Mental Status: She is alert and oriented to person, place, and time.    Laboratory examination:   Recent Labs    07/19/19 1613 09/25/19 1458  NA 140 140  K 4.1 4.3  CL 104 104  CO2 27 28  GLUCOSE 114* 95  BUN 16 17  CREATININE 0.91* 0.88  CALCIUM 9.5 9.5   CrCl cannot be calculated (Patient's most recent lab result is older than the maximum 21 days allowed.).  CMP Latest Ref Rng & Units 09/25/2019 07/19/2019 09/13/2018  Glucose 70 - 99 mg/dL 95 114(H) 98  BUN 6 - 23 mg/dL 17 16 19   Creatinine 0.40 - 1.20 mg/dL 0.88 0.91(H) 0.90  Sodium 135 - 145 mEq/L 140 140 141  Potassium 3.5 - 5.1 mEq/L 4.3 4.1 4.0  Chloride 96 - 112 mEq/L 104 104 106  CO2 19 - 32 mEq/L 28 27 27   Calcium 8.4 - 10.5 mg/dL 9.5 9.5 9.6  Total Protein 6.0 - 8.3 g/dL 7.2 6.9 7.3  Total Bilirubin 0.2 - 1.2 mg/dL 0.5 0.4 0.5  Alkaline Phos 39 - 117 U/L 54 - 59  AST 0 - 37 U/L 17 16 18   ALT 0 - 35 U/L 11 10 10    CBC Latest Ref Rng & Units 09/25/2019 09/13/2018 07/16/2018  WBC 4.0 - 10.5 K/uL 7.0 6.8 6.3  Hemoglobin 12.0 - 15.0 g/dL 12.7 13.7 14.0  Hematocrit 36.0 - 46.0 % 37.7 40.4 41.5  Platelets 150.0 - 400.0 K/uL 180.0 205.0 198.0   Lipid Panel     Component Value Date/Time   CHOL 194 09/25/2019 1458   TRIG 107.0 09/25/2019 1458   TRIG 103 12/06/2005 1055   HDL 53.40 09/25/2019 1458   CHOLHDL 4 09/25/2019 1458   VLDL 21.4 09/25/2019 1458   LDLCALC 119 (H) 09/25/2019 1458   LDLCALC 207 (H) 07/19/2019 1613   LDLDIRECT 156.0 11/06/2014 0932   HEMOGLOBIN A1C Lab Results  Component Value Date   HGBA1C 6.3 09/25/2019   TSH Recent Labs    09/25/19 1458  TSH 2.47   BNP (last 3 results) No results for input(s): BNP in the last 8760 hours.  Medications  and allergies  No Known Allergies   Prior to Admission medications   Medication  Sig Start Date End Date Taking? Authorizing Provider  aspirin 81 MG EC tablet Take 81 mg by mouth daily.      [provider]  cetirizine (ZYRTEC) 10 MG tablet Take 10 mg by mouth daily.      [provider]  Cholecalciferol (VITAMIN D3) 2000 UNITS TABS Take by mouth.      [provider]  CRESTOR 20 MG tablet Take 1 tablet (20 mg total) by mouth daily. 01/22/19   [name], NP  Cyanocobalamin (VITAMIN B 12 PO) Take by mouth daily.    [provider]  hydrocortisone 2.5 % lotion Apply topically 2 (two) times daily. 11/19/19   [name], NP  levothyroxine (SYNTHROID) 75 MCG tablet TAKE 1 TABLET BY MOUTH ONCE A DAY ON AN EMPTY STOMACH.TAKE WITH A GLASS OF WATERAT LEAST 30-60 MIN BEFORE BREAKFAST 01/19/20   [name], NP  lisinopril (ZESTRIL) 20 MG tablet TAKE 1 TABLET BY MOUTH ONCE A DAY 02/12/20   [name], NP  OVER THE COUNTER MEDICATION Take 1 capsule by mouth daily. Probiotic    [provider]  pantoprazole (PROTONIX) 20 MG tablet TAKE 1 TABLET BY MOUTH TWICE A DAY 10/21/19   [name], NP      Current Outpatient Medications  Medication Instructions  . aspirin 81 mg, Daily  . cetirizine (ZYRTEC) 10 mg, Daily  . Cholecalciferol (VITAMIN D3) 2000 UNITS TABS Take by mouth.    . Crestor 20 mg, Oral, Daily  . Cyanocobalamin (VITAMIN B 12 PO) Daily  . hydrocortisone 2.5 % lotion Topical, 2 times daily  . levothyroxine (SYNTHROID) 75 MCG tablet TAKE 1 TABLET BY MOUTH ONCE A DAY ON AN EMPTY STOMACH.TAKE WITH A GLASS OF WATERAT LEAST 30-60 MIN BEFORE BREAKFAST  . lisinopril (ZESTRIL) 20 MG tablet TAKE 1 TABLET BY MOUTH ONCE A DAY  . OVER THE COUNTER MEDICATION 1 capsule, Oral, Daily, Probiotic  . pantoprazole (PROTONIX) 20 MG tablet TAKE 1 TABLET BY MOUTH TWICE A DAY    No intake/output data recorded. No intake/output data recorded.     Radiology:  No results found.   Cardiac Studies:   EKG 03/05/2020: Normal sinus rhythm, inferior and lateral ST elevation with reciprocal ST depression in one and aVL, acute inferolateral STEMI.  Assessment   1.  Acute inferolateral STEMI 2.  Hypertension, primary 3.  Hyperlipidemia  Recommendations:   Patient with ongoing chest discomfort, will take her directly to the cardiac catheterization lab.  Will make further recommendation.  Blood pressure is also elevated but will address this after the catheterization, presently on statin will continue the same with high intensity high-dose statin for now in view of ACS.  Patient is willing to proceed, I have emergently discussed with the patient regarding the cardiac catheterization procedure and risks and benefits.    Adrian Prows, MD, [facility] 03/05/2020, 11:08 AM Office: [phone] Pager: [phone]

## 2020-03-05 NOTE — Progress Notes (Signed)
  Echocardiogram 2D Echocardiogram has been performed.  Amy Lin 03/05/2020, 4:37 PM

## 2020-03-06 ENCOUNTER — Encounter (HOSPITAL_COMMUNITY): Payer: Self-pay | Admitting: Cardiology

## 2020-03-06 ENCOUNTER — Encounter (HOSPITAL_COMMUNITY)
Admission: RE | Disposition: A | Discharge status: Home / Self Care | Payer: Self-pay | Source: Home / Self Care | Attending: Cardiology

## 2020-03-06 DIAGNOSIS — I200 Unstable angina: Secondary | ICD-10-CM

## 2020-03-06 HISTORY — PX: INTRAVASCULAR ULTRASOUND/IVUS: CATH118244

## 2020-03-06 HISTORY — PX: CORONARY STENT INTERVENTION: CATH118234

## 2020-03-06 LAB — BASIC METABOLIC PANEL
Anion gap: 10 (ref 5–15)
BUN: 13 mg/dL (ref 8–23)
CO2: 20 mmol/L — ABNORMAL LOW (ref 22–32)
Calcium: 8.5 mg/dL — ABNORMAL LOW (ref 8.9–10.3)
Chloride: 107 mmol/L (ref 98–111)
Creatinine, Ser: 0.9 mg/dL (ref 0.44–1.00)
GFR, Estimated: >60 mL/min (ref >60)
Glucose, Bld: 108 mg/dL — ABNORMAL HIGH (ref 70–99)
Potassium: 3.8 mmol/L (ref 3.5–5.1)
Sodium: 137 mmol/L (ref 135–145)

## 2020-03-06 LAB — CBC
HCT: 32.8 % — ABNORMAL LOW (ref 36.0–46.0)
Hemoglobin: 10.8 g/dL — ABNORMAL LOW (ref 12.0–15.0)
MCH: 30.7 pg (ref 26.0–34.0)
MCHC: 32.9 g/dL (ref 30.0–36.0)
MCV: 93.2 fL (ref 80.0–100.0)
Platelets: 144 10*3/uL — ABNORMAL LOW (ref 150–400)
RBC: 3.52 MIL/uL — ABNORMAL LOW (ref 3.87–5.11)
RDW: 12.6 % (ref 11.5–15.5)
WBC: 8.2 10*3/uL (ref 4.0–10.5)
nRBC: 0 % (ref 0.0–0.2)

## 2020-03-06 LAB — GLUCOSE, CAPILLARY: Glucose-Capillary: 152 mg/dL — ABNORMAL HIGH (ref 70–99)

## 2020-03-06 LAB — ECHOCARDIOGRAM COMPLETE
Area-P 1/2: 3.48 cm2
S' Lateral: 2.9 cm
Weight: 2469.15 oz

## 2020-03-06 LAB — MRSA PCR SCREENING: MRSA by PCR: NEGATIVE

## 2020-03-06 SURGERY — CORONARY STENT INTERVENTION
Anesthesia: LOCAL

## 2020-03-06 MED ORDER — HEPARIN (PORCINE) IN NACL 1000-0.9 UT/500ML-% IV SOLN
INTRAVENOUS | Status: AC
  Filled 2020-03-06: qty 1000

## 2020-03-06 MED ORDER — FENTANYL CITRATE (PF) 100 MCG/2ML IJ SOLN
INTRAMUSCULAR | Status: AC
  Filled 2020-03-06: qty 2

## 2020-03-06 MED ORDER — SODIUM CHLORIDE 0.9% FLUSH: 3.0000 mL | INTRAVENOUS | Status: DC | PRN

## 2020-03-06 MED ORDER — IOHEXOL 350 MG/ML SOLN
INTRAVENOUS | Status: DC | PRN
  Administered 2020-03-06: 115 mL

## 2020-03-06 MED ORDER — HEPARIN (PORCINE) IN NACL 1000-0.9 UT/500ML-% IV SOLN
INTRAVENOUS | Status: DC | PRN
  Administered 2020-03-06 (×2): 500 mL

## 2020-03-06 MED ORDER — LIDOCAINE HCL (PF) 1 % IJ SOLN
INTRAMUSCULAR | Status: AC
  Filled 2020-03-06: qty 30

## 2020-03-06 MED ORDER — SODIUM CHLORIDE 0.9 % WEIGHT BASED INFUSION: 1.0000 mL/kg/h | INTRAVENOUS | Status: DC

## 2020-03-06 MED ORDER — SODIUM CHLORIDE 0.9% FLUSH: 3.0000 mL | Freq: Two times a day (BID) | INTRAVENOUS | Status: DC

## 2020-03-06 MED ORDER — SODIUM CHLORIDE 0.9% FLUSH
3.0000 mL | Freq: Two times a day (BID) | INTRAVENOUS | Status: DC
  Administered 2020-03-06: 3 mL via INTRAVENOUS

## 2020-03-06 MED ORDER — IOHEXOL 350 MG/ML SOLN
INTRAVENOUS | Status: AC
  Filled 2020-03-06: qty 1

## 2020-03-06 MED ORDER — MIDAZOLAM HCL 2 MG/2ML IJ SOLN
INTRAMUSCULAR | Status: DC | PRN
  Administered 2020-03-06: 1 mg via INTRAVENOUS

## 2020-03-06 MED ORDER — SODIUM CHLORIDE 0.9 % WEIGHT BASED INFUSION
1.0000 mL/kg/h | INTRAVENOUS | Status: AC
  Administered 2020-03-06: 1 mL/kg/h via INTRAVENOUS

## 2020-03-06 MED ORDER — VERAPAMIL HCL 2.5 MG/ML IV SOLN
INTRAVENOUS | Status: DC | PRN
  Administered 2020-03-06: 5 mL via INTRA_ARTERIAL

## 2020-03-06 MED ORDER — HEPARIN SODIUM (PORCINE) 1000 UNIT/ML IJ SOLN
INTRAMUSCULAR | Status: DC | PRN
  Administered 2020-03-06: 2000 [IU] via INTRAVENOUS
  Administered 2020-03-06: 6000 [IU] via INTRAVENOUS

## 2020-03-06 MED ORDER — FENTANYL CITRATE (PF) 100 MCG/2ML IJ SOLN
INTRAMUSCULAR | Status: DC | PRN
  Administered 2020-03-06: 25 ug via INTRAVENOUS

## 2020-03-06 MED ORDER — EZETIMIBE 10 MG PO TABS
10.0000 mg | ORAL_TABLET | Freq: Every day | ORAL | Status: DC
  Administered 2020-03-06: 10 mg via ORAL
  Filled 2020-03-06: qty 1

## 2020-03-06 MED ORDER — SODIUM CHLORIDE 0.9 % IV SOLN: 250.0000 mL | INTRAVENOUS | Status: DC | PRN

## 2020-03-06 MED ORDER — NITROGLYCERIN 1 MG/10 ML FOR IR/CATH LAB
INTRA_ARTERIAL | Status: DC | PRN
  Administered 2020-03-06 (×3): 200 ug via INTRACORONARY

## 2020-03-06 MED ORDER — LABETALOL HCL 5 MG/ML IV SOLN: 10.0000 mg | INTRAVENOUS | Status: AC | PRN

## 2020-03-06 MED ORDER — LIDOCAINE HCL (PF) 1 % IJ SOLN
INTRAMUSCULAR | Status: DC | PRN
  Administered 2020-03-06: 2 mL

## 2020-03-06 MED ORDER — MIDAZOLAM HCL 2 MG/2ML IJ SOLN
INTRAMUSCULAR | Status: AC
  Filled 2020-03-06: qty 2

## 2020-03-06 MED ORDER — HEPARIN SODIUM (PORCINE) 1000 UNIT/ML IJ SOLN
INTRAMUSCULAR | Status: AC
  Filled 2020-03-06: qty 1

## 2020-03-06 MED ORDER — VERAPAMIL HCL 2.5 MG/ML IV SOLN
INTRAVENOUS | Status: AC
  Filled 2020-03-06: qty 2

## 2020-03-06 MED ORDER — SODIUM CHLORIDE 0.9 % WEIGHT BASED INFUSION: 3.0000 mL/kg/h | INTRAVENOUS | Status: DC

## 2020-03-06 SURGICAL SUPPLY — 16 items
BALLN SAPPHIRE 2.0X12 (BALLOONS) ×2
BALLOON SAPPHIRE 2.0X12 (BALLOONS) IMPLANT
CATH DRAGONFLY OPSTAR (CATHETERS) ×1 IMPLANT
CATH VISTA GUIDE 6FR XB3 (CATHETERS) ×1 IMPLANT
DEVICE RAD COMP TR BAND LRG (VASCULAR PRODUCTS) ×1 IMPLANT
GLIDESHEATH SLEND A-KIT 6F 22G (SHEATH) ×1 IMPLANT
GLIDESHEATH SLEND SS 6F .021 (SHEATH) ×1 IMPLANT
GUIDEWIRE INQWIRE 1.5J.035X260 (WIRE) IMPLANT
INQWIRE 1.5J .035X260CM (WIRE) ×2
KIT ENCORE 26 ADVANTAGE (KITS) ×1 IMPLANT
KIT HEART LEFT (KITS) ×2 IMPLANT
PACK CARDIAC CATHETERIZATION (CUSTOM PROCEDURE TRAY) ×2 IMPLANT
STENT RESOLUTE ONYX 2.5X15 (Permanent Stent) ×1 IMPLANT
TRANSDUCER W/STOPCOCK (MISCELLANEOUS) ×2 IMPLANT
TUBING CIL FLEX 10 FLL-RA (TUBING) ×2 IMPLANT
WIRE COUGAR XT STRL 190CM (WIRE) ×2 IMPLANT

## 2020-03-06 NOTE — Progress Notes (Signed)
I met with the patient and her daughter at the bedside.  Patient today is completely asymptomatic.  No clinical evidence of heart failure.  Labs are remained stable, decrease CBC probably artificially low of fluid overloading.  I reviewed the echo reports as well.  I discussed the findings of the high-grade stenosis and ulcerated stenosis noted in the OM1 branch, which is very large, we decided to proceed with angioplasty this afternoon.  Patient and her daughter are aware of less than 1% risk of death, stroke, MI, need for emergent CABG due to perforation.  They are willing to proceed.   Latavion Halls, MD, FACC 03/06/2020, 9:47 AM Office: [phone] Pager: [phone]  

## 2020-03-06 NOTE — Interval H&P Note (Signed)
History and Physical Interval Note:  03/06/2020 3:32 PM  Amy Lin  has presented today for surgery, with the diagnosis of chest pain.  The various methods of treatment have been discussed with the patient and family. After consideration of risks, benefits and other options for treatment, the patient has consented to  Procedure(s): CORONARY STENT INTERVENTION (N/A) as a surgical intervention.  The patient's history has been reviewed, patient examined, no change in status, stable for surgery.  I have reviewed the patient's chart and labs.  Questions were answered to the patient's satisfaction.    Cath Lab Visit (complete for each Cath Lab visit)  Clinical Evaluation Leading to the Procedure:   ACS: Yes.    Non-ACS:    Anginal Classification: CCS IV  Anti-ischemic medical therapy: Minimal Therapy (1 class of medications)  Non-Invasive Test Results: No non-invasive testing performed  Prior CABG: No previous CABG   Adrian Prows

## 2020-03-06 NOTE — Progress Notes (Signed)
Benefits check in process for Brilinta 90mg  bid. Whitman Hero RN,BSN,CM [phone]

## 2020-03-06 NOTE — H&P (View-Only) (Signed)
I met with the patient and her daughter at the bedside.  Patient today is completely asymptomatic.  No clinical evidence of heart failure.  Labs are remained stable, decrease CBC probably artificially low of fluid overloading.  I reviewed the echo reports as well.  I discussed the findings of the high-grade stenosis and ulcerated stenosis noted in the OM1 branch, which is very large, we decided to proceed with angioplasty this afternoon.  Patient and her daughter are aware of less than 1% risk of death, stroke, MI, need for emergent CABG due to perforation.  They are willing to proceed.   Adrian Prows, MD, [facility] 03/06/2020, 9:47 AM Office: [phone] Pager: [phone]

## 2020-03-06 NOTE — TOC Benefit Eligibility Note (Signed)
Transition of Care [facility]) Benefit Eligibility Note    Patient Details  Name: Amy Lin MRN: [id number] Date of Birth: 1933-06-22   Medication/Dose: Brilinta 90 mg. BID for 30 day supply  Covered?: Yes  Tier: 3 Drug  Prescription Coverage Preferred Pharmacy: any pharmacy of choice.  Spoke with Person/Company/Phone Number:: [employer] [phone]  Co-Pay: $45.00  Prior Approval: No  Deductible:  (No Deductible)       Shelda Altes Phone Number: 03/06/2020, 10:00 AM

## 2020-03-06 NOTE — Progress Notes (Signed)
CARDIAC REHAB PHASE I    Stent and MI education completed with pt and daughter. Pt educated on importance of ASA, Brilinta, statin, and NTG. Pt given MI book, stent card, and heart healthy diet. Reviewed site care, restrictions, and exercise guidelines. Pt for staged intervention later today. Will f/u tomorrow for questions or concerns. Pt referred to CRP [facility] [place].  [id number] Rufina Falco, RN BSN 03/06/2020 10:01 AM

## 2020-03-07 LAB — BASIC METABOLIC PANEL
Anion gap: 11 (ref 5–15)
BUN: 18 mg/dL (ref 8–23)
CO2: 22 mmol/L (ref 22–32)
Calcium: 8.9 mg/dL (ref 8.9–10.3)
Chloride: 107 mmol/L (ref 98–111)
Creatinine, Ser: 1.14 mg/dL — ABNORMAL HIGH (ref 0.44–1.00)
GFR, Estimated: 47 mL/min — ABNORMAL LOW (ref >60)
Glucose, Bld: 110 mg/dL — ABNORMAL HIGH (ref 70–99)
Potassium: 3.8 mmol/L (ref 3.5–5.1)
Sodium: 140 mmol/L (ref 135–145)

## 2020-03-07 LAB — CBC
HCT: 35.6 % — ABNORMAL LOW (ref 36.0–46.0)
Hemoglobin: 11.7 g/dL — ABNORMAL LOW (ref 12.0–15.0)
MCH: 30.9 pg (ref 26.0–34.0)
MCHC: 32.9 g/dL (ref 30.0–36.0)
MCV: 93.9 fL (ref 80.0–100.0)
Platelets: 151 10*3/uL (ref 150–400)
RBC: 3.79 MIL/uL — ABNORMAL LOW (ref 3.87–5.11)
RDW: 12.7 % (ref 11.5–15.5)
WBC: 7.9 10*3/uL (ref 4.0–10.5)
nRBC: 0 % (ref 0.0–0.2)

## 2020-03-07 LAB — GLUCOSE, CAPILLARY: Glucose-Capillary: 112 mg/dL — ABNORMAL HIGH (ref 70–99)

## 2020-03-07 MED ORDER — EZETIMIBE 10 MG PO TABS: 10.0000 mg | ORAL_TABLET | Freq: Every day | ORAL | 2 refills | Status: DC

## 2020-03-07 MED ORDER — METOPROLOL TARTRATE 25 MG PO TABS: 25.0000 mg | ORAL_TABLET | Freq: Two times a day (BID) | ORAL | 1 refills | Status: DC

## 2020-03-07 MED ORDER — TICAGRELOR 90 MG PO TABS: 90.0000 mg | ORAL_TABLET | Freq: Two times a day (BID) | ORAL | 0 refills | Status: DC

## 2020-03-07 NOTE — Progress Notes (Signed)
CARDIAC REHAB PHASE I   PRE:  Rate/Rhythm: Not on telemetry  BP:  Supine:   Sitting: 151/73  Standing:    SaO2:   MODE:  Ambulation: 440 ft   POST:  Rate/Rhythm: not on telemetry  BP:  Supine:   Sitting: 171/81  Standing:    SaO2:  Tolerated ambulation well independently without angina or difficulty.  Reviewed MI education with patient and daughter, also reviewed DAPT meds, angina symptoms, NTG usage and when to call the MD.  Referred to phase II cardiac rehab @ [facility]. [phone]  Liliane Channel RN, BSN 03/07/2020 11:59 AM

## 2020-03-07 NOTE — Plan of Care (Signed)

## 2020-03-07 NOTE — Progress Notes (Signed)
2778 Patient not in room , personal belonging at bedside. Security notified, charge Therapist, sports ,[place] notified. Found patient in 1st floor after using bathroom  per nursing staff, assisted back in her room , patient son Amy Lin notified,updated with situation ,stated will call his sister to come and stay with patient . Son also spoke to patient by phone. Endorsed to incoming RN. Charge nuse made aware, patient still not willing to put back her cardiac monitor, left sitting at side of bed , in no discomforts.

## 2020-03-07 NOTE — Progress Notes (Signed)
Patient awake and ambulating  in room, telemetry monitor off, attempted to place back , patient refused. Noted easily agitated when explained reason of stay and purpose of cardiac monitor. insisted to put on her own clothes ,stated getting ready to go home. Explained MD to see her first this AM before discharge. Paatient agreed to wait for MD in room.

## 2020-03-07 NOTE — Plan of Care (Signed)
Problem: Education: Goal: Knowledge of General Education information will improve Description: Including pain rating scale, medication(s)/side effects and non-pharmacologic comfort measures 03/07/2020 1113 by Camillia Herter, RN Outcome: Adequate for Discharge 03/07/2020 1113 by Camillia Herter, RN Outcome: Adequate for Discharge 03/07/2020 0741 by Camillia Herter, RN Outcome: Progressing   Problem: Health Behavior/Discharge Planning: Goal: Ability to manage health-related needs will improve 03/07/2020 1113 by Camillia Herter, RN Outcome: Adequate for Discharge 03/07/2020 1113 by Camillia Herter, RN Outcome: Adequate for Discharge 03/07/2020 0741 by Camillia Herter, RN Outcome: Progressing   Problem: Clinical Measurements: Goal: Ability to maintain clinical measurements within normal limits will improve 03/07/2020 1113 by Camillia Herter, RN Outcome: Adequate for Discharge 03/07/2020 1113 by Camillia Herter, RN Outcome: Adequate for Discharge 03/07/2020 0741 by Camillia Herter, RN Outcome: Progressing Goal: Will remain free from infection 03/07/2020 1113 by Camillia Herter, RN Outcome: Adequate for Discharge 03/07/2020 1113 by Camillia Herter, RN Outcome: Adequate for Discharge 03/07/2020 0741 by Camillia Herter, RN Outcome: Progressing Goal: Diagnostic test results will improve 03/07/2020 1113 by Camillia Herter, RN Outcome: Adequate for Discharge 03/07/2020 1113 by Camillia Herter, RN Outcome: Adequate for Discharge 03/07/2020 0741 by Camillia Herter, RN Outcome: Progressing Goal: Respiratory complications will improve 03/07/2020 1113 by Camillia Herter, RN Outcome: Adequate for Discharge 03/07/2020 1113 by Camillia Herter, RN Outcome: Adequate for Discharge 03/07/2020 0741 by Camillia Herter, RN Outcome: Progressing Goal: Cardiovascular complication will be avoided 03/07/2020 1113 by Camillia Herter, RN Outcome: Adequate for Discharge 03/07/2020 1113 by Camillia Herter, RN Outcome: Adequate for Discharge 03/07/2020 0741  by Camillia Herter, RN Outcome: Progressing   Problem: Activity: Goal: Risk for activity intolerance will decrease 03/07/2020 1113 by Camillia Herter, RN Outcome: Adequate for Discharge 03/07/2020 1113 by Camillia Herter, RN Outcome: Adequate for Discharge 03/07/2020 0741 by Camillia Herter, RN Outcome: Progressing   Problem: Nutrition: Goal: Adequate nutrition will be maintained 03/07/2020 1113 by Camillia Herter, RN Outcome: Adequate for Discharge 03/07/2020 1113 by Camillia Herter, RN Outcome: Adequate for Discharge 03/07/2020 0741 by Camillia Herter, RN Outcome: Progressing   Problem: Coping: Goal: Level of anxiety will decrease 03/07/2020 1113 by Camillia Herter, RN Outcome: Adequate for Discharge 03/07/2020 1113 by Camillia Herter, RN Outcome: Adequate for Discharge 03/07/2020 0741 by Camillia Herter, RN Outcome: Progressing   Problem: Elimination: Goal: Will not experience complications related to bowel motility 03/07/2020 1113 by Camillia Herter, RN Outcome: Adequate for Discharge 03/07/2020 1113 by Camillia Herter, RN Outcome: Adequate for Discharge 03/07/2020 0741 by Camillia Herter, RN Outcome: Progressing Goal: Will not experience complications related to urinary retention 03/07/2020 1113 by Camillia Herter, RN Outcome: Adequate for Discharge 03/07/2020 1113 by Camillia Herter, RN Outcome: Adequate for Discharge 03/07/2020 0741 by Camillia Herter, RN Outcome: Progressing   Problem: Pain Managment: Goal: General experience of comfort will improve 03/07/2020 1113 by Camillia Herter, RN Outcome: Adequate for Discharge 03/07/2020 1113 by Camillia Herter, RN Outcome: Adequate for Discharge 03/07/2020 0741 by Camillia Herter, RN Outcome: Progressing   Problem: Safety: Goal: Ability to remain free from injury will improve 03/07/2020 1113 by Camillia Herter, RN Outcome: Adequate for Discharge 03/07/2020 1113 by Camillia Herter, RN Outcome: Adequate for Discharge 03/07/2020 0741 by Camillia Herter, RN Outcome:  Progressing   Problem: Skin Integrity: Goal: Risk for impaired skin integrity will  decrease 03/07/2020 1113 by Camillia Herter, RN Outcome: Adequate for Discharge 03/07/2020 1113 by Camillia Herter, RN Outcome: Adequate for Discharge 03/07/2020 0741 by Camillia Herter, RN Outcome: Progressing

## 2020-03-07 NOTE — Discharge Summary (Signed)
Physician Discharge Summary  Patient ID: CHERRILL SCRIMA MRN: [id number] DOB/AGE: 02/02/1933 87 y.o. Amy Fenton, NP   Admit date: 03/05/2020 Discharge date: 03/07/2020  Primary Discharge Diagnosis Acute inferior ST elevation myocardial infarction Hypercholesterolemia Hypertension   Significant Diagnostic Studies:  Echocardiogram 03/05/2020:   1. Due to mitral and calcification, diastolic function cannot be estimated. However appears to show pseudonormal pattern.. Left ventricular ejection fraction, by estimation, is 50 to 55%. The left ventricle has low normal function. The left ventricle  demonstrates regional wall motion abnormalities (see scoring diagram/findings for description). There is mild left ventricular hypertrophy. Left ventricular diastolic parameters are indeterminate. There is mild of the left ventricular, entire inferior  wall.  2. Right ventricular systolic function is normal. The right ventricular size is normal. There is mildly elevated pulmonary artery systolic pressure.  3. Left atrial size was mildly dilated.  4. The mitral valve is normal in structure. Trivial mitral valve regurgitation. Moderate mitral annular calcification.  5. The aortic valve is tricuspid. There is mild calcification of the aortic valve. Aortic valve regurgitation is trivial. Mild aortic valve sclerosis is present, with no evidence of aortic valve stenosis.  6. The inferior vena cava is normal in size with <50% respiratory variability, suggesting right atrial pressure of 8 mmHg.   Left Heart Catheterization and stenting to RCA 03/05/20:  LV: Normal size.  Inferior akinesis.  LVEF 40%.  No significant MR.  Severe mitral annular calcification evident.  No pressure gradient across the aortic valve. LM: Large vessel, mildly calcified. CX: Very large vessel giving origin to large OM1 small OM 2 very large OM 3.  Ostium of the circumflex has a 30% stenosis.  Very large OM1 with high-grade and  irregular marginal 95% stenosis in the proximal segment.  Moderate amount of calcification is evident in the coronary vessels.  Severe tortuosity is evident in the high OM1 and also large OM 3. LAD: Large caliber vessel giving origin to a moderate to large size D1.  Mild luminal irregularities evident. RCA: Large vessel and dominant vessel.  Tortuous in the mid to distal segment.  It is occluded the proximal segment. Successful aspiration thrombectomy with Pronto V4 catheter followed by stenting with 4.0 x 30 mm resolute Onyx DES, stenosis reduced from 100% to 0% and TIMI 0 to TIMI-3 flow.   Coronary angioplasty to OM1 03/06/2020: OCT guided PTCA and stenting of the large OM1 with 2.5 x 15 mm resolute Onyx DES, stenosis reduced from 99% to 0%, >95% stent apposition and expansion.  No edge dissection.  Recommendation: Patient will hopefully be discharged home tomorrow if she remains stable.  She will need dual antiplatelet therapy for a year in view of ACS.  Continue risk factor modification is indicated.  115 mL contrast utilized.  EKG:  EKG 03/07/2020: Normal sinus rhythm with rate of 73 bpm, normal axis, T wave abnormality, consider inferolateral ischemia.  Compared to 03/06/2020, no significant change.  Hospital Course: Amy Lin is a 87 y.o. female  patient Caucasian female patient with hypertension, hyperlipidemia, presented with chest tightness ongoing for the past 2 days worse today on admission on 03/05/2020 and was found to have inferior ST elevation STEMI, taken emergently to the cardiac catheterization lab and underwent successful angioplasty and stenting with 4.0 x 30 mm resolute Onyx to the dominant proximal RCA.  She had residual high-grade and ulcerated stenosis in the OM1 branch of circumflex which was felt to be unstable and underwent staged intervention to the  vessel, with placement of 2.5 x 15 mm resolute Onyx DES.  The following morning she felt well, stable for discharge with  outpatient follow-up in a week to 10 days.  Recommendations on discharge: Patient has been started on metoprolol tartrate 25 mg twice daily, she will continue with lisinopril and aspirin and Brilinta has been added.  As her LDL was not at goal, 10 mg of Zetia was also added.  She will need outpatient cardiac rehab.  Discharge Exam: Vitals with BMI 03/06/2020 03/06/2020 03/06/2020  Height - - -  Weight - - -  BMI - - -  Systolic 376 283 151  Diastolic 71 56 761  Pulse 67 65 68    Physical Exam Constitutional:      Appearance: Normal appearance.  Eyes:     Extraocular Movements: Extraocular movements intact.  Cardiovascular:     Rate and Rhythm: Normal rate and regular rhythm.     Pulses: Intact distal pulses.     Heart sounds: Normal heart sounds. No murmur heard. No gallop.      Comments: Trace left ankle edema, no JVD.  Right radial arterial access site is healing well. Pulmonary:     Effort: Pulmonary effort is normal.     Breath sounds: Normal breath sounds.  Abdominal:     General: Bowel sounds are normal.     Palpations: Abdomen is soft.  Musculoskeletal:     Cervical back: Normal range of motion.  Skin:    General: Skin is warm and dry.  Neurological:     General: No focal deficit present.     Mental Status: She is alert and oriented to person, place, and time.  Psychiatric:        Mood and Affect: Mood normal.    Labs:   Lab Results  Component Value Date   WBC 7.9 03/07/2020   HGB 11.7 (L) 03/07/2020   HCT 35.6 (L) 03/07/2020   MCV 93.9 03/07/2020   PLT 151 03/07/2020    Recent Labs  Lab 03/05/20 1013 03/06/20 0055 03/07/20 0215  NA 139   < > 140  K 3.7   < > 3.8  CL 106   < > 107  CO2 21*   < > 22  BUN 14   < > 18  CREATININE 0.80   < > 1.14*  CALCIUM 9.4   < > 8.9  PROT 6.8  --   --   BILITOT 0.7  --   --   ALKPHOS 51  --   --   ALT 16  --   --   AST 22  --   --   GLUCOSE 129*   < > 110*   < > = values in this interval not displayed.    Lipid  Panel     Component Value Date/Time   CHOL 194 03/05/2020 1013   TRIG 146 03/05/2020 1013   TRIG 103 12/06/2005 1055   HDL 51 03/05/2020 1013   CHOLHDL 3.8 03/05/2020 1013   VLDL 29 03/05/2020 1013   LDLCALC 114 (H) 03/05/2020 1013   LDLCALC 207 (H) 07/19/2019 1613   Component Ref Range & Units 2 d ago  (03/05/20) 2 d ago  (03/05/20)  Troponin I (High Sensitivity) <18 ng/L 4,543High Panic  131High Panic CM     HEMOGLOBIN A1C Lab Results  Component Value Date   HGBA1C 6.3 09/25/2019   TSH Recent Labs    09/25/19 1458 03/05/20 1520  TSH 2.47  2.002    FOLLOW UP PLANS AND APPOINTMENTS Discharge Instructions    AMB Referral to Cardiac Rehabilitation - Phase II   Complete by: As directed    Diagnosis:  STEMI Coronary Stents     After initial evaluation and assessments completed: Virtual Based Care may be provided alone or in conjunction with Phase 2 Cardiac Rehab based on patient barriers.: Yes     Allergies as of 03/07/2020   No Known Allergies     Medication List    TAKE these medications   aspirin 81 MG EC tablet Take 81 mg by mouth daily.   cetirizine 10 MG tablet Commonly known as: ZYRTEC Take 10 mg by mouth daily.   Crestor 20 MG tablet Generic drug: rosuvastatin Take 1 tablet (20 mg total) by mouth daily. What changed: when to take this   ezetimibe 10 MG tablet Commonly known as: ZETIA Take 1 tablet (10 mg total) by mouth daily after supper.   hydrocortisone 2.5 % lotion Apply topically 2 (two) times daily.   levothyroxine 75 MCG tablet Commonly known as: SYNTHROID TAKE 1 TABLET BY MOUTH ONCE A DAY ON AN EMPTY STOMACH.TAKE WITH A GLASS OF WATERAT LEAST 30-60 MIN BEFORE BREAKFAST What changed: See the new instructions.   lisinopril 20 MG tablet Commonly known as: ZESTRIL TAKE 1 TABLET BY MOUTH ONCE A DAY What changed: when to take this   metoprolol tartrate 25 MG tablet Commonly known as: LOPRESSOR Take 1 tablet (25 mg total) by mouth 2  (two) times daily.   pantoprazole 20 MG tablet Commonly known as: PROTONIX TAKE 1 TABLET BY MOUTH TWICE A DAY What changed: when to take this   ticagrelor 90 MG Tabs tablet Commonly known as: BRILINTA Take 1 tablet (90 mg total) by mouth 2 (two) times daily.   VITAMIN B 12 PO Take 1 tablet by mouth every evening.   Vitamin D3 50 MCG (2000 UT) Tabs Take 1 tablet by mouth every evening.       Follow-up Information    Call Adrian Prows, MD.   Specialty: Cardiology Why: To be seen in 1 week. Our office will also contact you Contact information: [place] [id number] [phone]              Total time spent: 35 minutes   [facility]  Pager: [phone] Office: [phone]

## 2020-03-07 NOTE — TOC Transition Note (Signed)
Transition of Care [facility]) - CM/SW Discharge Note   Patient Details  Name: Amy Lin MRN: [id number] Date of Birth: 1933-02-24  [facility] CM/SW Contact:  Zenon Mayo, RN Phone Number: 03/07/2020, 10:57 AM   Clinical Narrative:    Patient is for dc today, Staff RN Jeani Hawking will give the daughter , [name] the [place] coupon for th 30 day free.  NCM spoke with her and informed her about the 45.00 copay for refills, daughter states the pharmacy told her also it will be 45.00.     Final next level of care: Home/Self Care Barriers to Discharge: No Barriers Identified   Patient Goals and CMS Choice     Choice offered to / list presented to : NA  Discharge Placement                       Discharge Plan and Services                  DME Agency: NA       HH Arranged: NA          Social Determinants of Health (SDOH) Interventions     Readmission Risk Interventions No flowsheet data found.

## 2020-03-08 ENCOUNTER — Other Ambulatory Visit: Payer: Self-pay | Admitting: Cardiology

## 2020-03-08 DIAGNOSIS — I25110 Atherosclerotic heart disease of native coronary artery with unstable angina pectoris: Secondary | ICD-10-CM

## 2020-03-08 MED ORDER — NITROGLYCERIN 0.4 MG SL SUBL: 0.4000 mg | SUBLINGUAL_TABLET | SUBLINGUAL | 3 refills | Status: DC | PRN

## 2020-03-09 ENCOUNTER — Encounter (HOSPITAL_COMMUNITY): Payer: Self-pay | Admitting: Cardiology

## 2020-03-09 LAB — POCT ACTIVATED CLOTTING TIME: Activated Clotting Time: 279 seconds

## 2020-03-12 NOTE — Progress Notes (Signed)
Primary Physician/Referring:  [name], NP  Patient ID: Amy Lin, female    DOB: [birth year], 87 y.o.   MRN: [id number]  Chief Complaint  Patient presents with   Coronary Artery Disease   Hospitalization Follow-up   Follow-up   HPI:    Amy Lin  is a 87 y.o. Caucasian female with history of hypertension, hyperlipidemia, GERD who presented to [facility] emergency department 03/05/2020 with active chest pain and EKG revealing inferior STEMI. Recommend patient continue DAPT until at least 03/07/2021 if tolerated.   Patient was taken emergently for cardiac catheterization and underwent successful angioplasty and stenting with 4.0 x 30 mm resolute Onyx to the dominant proximal RCA.  She had residual high-grade and ulcerated stenosis in the OM1 branch of circumflex which was felt to be unstable and underwent staged intervention to the vessel, with placement of 2.5 x 15 mm resolute Onyx DES.  Upon discharge patient was started on guideline directed medical therapy including metoprolol tartrate, lisinopril, rosuvastatin, aspirin, and Brilinta, as well as Zetia.  Patient presents for follow up accompanied by her daughter Amy Lin. She states overall she has been feeling well and tolerating current medications without issue. She has noticed 2 brief episodes of chest pain, not with exertion. She has not taken nitroglycerin, these episodes have resolved spontaneously. She has slowly been increasing her physical activity. Presently she is walking twice daily around the house for 12 minutes. She reports some dyspnea on exertion as well as fatigue. Denies palpitations, leg swelling, syncope, near-syncope, PND, orthopnea. She reports has has not been monitoring her blood pressure daily at home. She admits to eating salty foods.   Discharge summary via Dr. Einar Gip 03/07/2020:  Hospital Course: Amy Lin is a 87 y.o. female  patient Caucasian female patient with hypertension, hyperlipidemia, presented  with chest tightness ongoing for the past 2 days worse today on admission on 03/05/2020 and was found to have inferior ST elevation STEMI, taken emergently to the cardiac catheterization lab and underwent successful angioplasty and stenting with 4.0 x 30 mm resolute Onyx to the dominant proximal RCA.  She had residual high-grade and ulcerated stenosis in the OM1 branch of circumflex which was felt to be unstable and underwent staged intervention to the vessel, with placement of 2.5 x 15 mm resolute Onyx DES.  The following morning she felt well, stable for discharge with outpatient follow-up in a week to 10 days.  Recommendations on discharge: Patient has been started on metoprolol tartrate 25 mg twice daily, she will continue with lisinopril and aspirin and Brilinta has been added.  As her LDL was not at goal, 10 mg of Zetia was also added.  She will need outpatient cardiac rehab.   Past Medical History:  Diagnosis Date   Allergy    Coronary artery disease    GERD (gastroesophageal reflux disease)    Hyperlipidemia    Hypertension    diuretic for edema at first   Hypothyroidism    Myocardial infarction ([place])    Osteoarthritis, multiple sites    Osteoporosis    Past Surgical History:  Procedure Laterality Date   ABDOMINAL HYSTERECTOMY     BACK SURGERY  1999   Discectomy and Ray cage   BLADDER REPAIR  ~2010   CARDIAC CATHETERIZATION  03/05/2020   CARPAL TUNNEL RELEASE  8/15   COLONOSCOPY W/ BIOPSIES N/A 2016   CORONARY STENT INTERVENTION N/A 03/06/2020   Procedure: CORONARY STENT INTERVENTION;  Surgeon: Adrian Prows, MD;  Location: [place] [facility];  Service: Cardiovascular;  Laterality: N/A;   CORONARY THROMBECTOMY N/A 03/05/2020   Procedure: Coronary Thrombectomy;  Surgeon: Adrian Prows, MD;  Location: [place] [facility];  Service: Cardiovascular;  Laterality: N/A;   CORONARY/GRAFT ACUTE MI REVASCULARIZATION N/A 03/05/2020   Procedure: Coronary/Graft Acute MI  Revascularization;  Surgeon: Adrian Prows, MD;  Location: [place] [facility];  Service: Cardiovascular;  Laterality: N/A;   INTRAVASCULAR ULTRASOUND/IVUS N/A 03/06/2020   Procedure: Intravascular Ultrasound/IVUS;  Surgeon: Adrian Prows, MD;  Location: [place] [facility];  Service: Cardiovascular;  Laterality: N/A;   LEFT HEART CATH AND CORONARY ANGIOGRAPHY N/A 03/05/2020   Procedure: LEFT HEART CATH AND CORONARY ANGIOGRAPHY;  Surgeon: Adrian Prows, MD;  Location: [place] [facility];  Service: Cardiovascular;  Laterality: N/A;   MENISCECTOMY Left 2/15   Dr Veverly Fells   REFRACTIVE SURGERY Bilateral 07/2019   UPPER GI ENDOSCOPY N/A Feb 2017   Family History  Problem Relation Age of Onset   Heart disease Mother    Stroke Father    Hypertension Father    Heart disease Father    Diabetes Father     Social History   Tobacco Use   Smoking status: Never Smoker   Smokeless tobacco: Never Used  Substance Use Topics   Alcohol use: No   Marital Status: Divorced   ROS  Review of Systems  Constitutional: Positive for malaise/fatigue. Negative for weight gain.  Cardiovascular: Positive for chest pain (2 brief nonexertional episodes ) and dyspnea on exertion. Negative for claudication, leg swelling, near-syncope, orthopnea, palpitations, paroxysmal nocturnal dyspnea and syncope.  Hematologic/Lymphatic: Does not bruise/bleed easily.  Gastrointestinal: Negative for melena.  Neurological: Negative for dizziness and weakness.    Objective  Blood pressure 131/67, pulse (!) 53, temperature (!) 95.6 F (35.3 C), temperature source Temporal, resp. rate 16, height 5' 3.4" (1.61 m), weight 154 lb 9.6 oz (70.1 kg), SpO2 95 %.  Vitals with BMI 03/13/2020 03/07/2020 03/06/2020  Height 5' 3.4" - -  Weight 154 lbs 10 oz - -  BMI 71.69 - -  Systolic 678 938 101  Diastolic 67 73 71  Pulse 53 66 67    Orthostatic VS for the past 72 hrs (Last 3 readings):  Orthostatic BP Patient Position BP Location Cuff Size  Orthostatic Pulse  03/13/20 1355 139/67 Standing Left Arm Normal 53  03/13/20 1354 135/62 Sitting Left Arm Normal 57  03/13/20 1353 132/61 Supine Left Arm Normal 52      Physical Exam Vitals reviewed.  Constitutional:      Appearance: Normal appearance.  HENT:     Head: Normocephalic and atraumatic.  Cardiovascular:     Rate and Rhythm: Normal rate and regular rhythm.     Pulses: Intact distal pulses.          Carotid pulses are 2+ on the right side and 2+ on the left side.      Radial pulses are 2+ on the right side and 2+ on the left side.       Femoral pulses are 2+ on the right side and 2+ on the left side.      Popliteal pulses are 2+ on the right side and 2+ on the left side.       Dorsalis pedis pulses are 0 on the right side and 0 on the left side.       Posterior tibial pulses are 0 on the left side.     Heart sounds: S1 normal  and S2 normal. Murmur heard.   Midsystolic murmur is present with a grade of 2/6 at the upper right sternal border. No gallop.      Comments: Right radial access well healed without erythema, warmth, drainage, ecchymosis, or hematoma.  Pulmonary:     Effort: Pulmonary effort is normal. No respiratory distress.     Breath sounds: No wheezing, rhonchi or rales.  Musculoskeletal:     Right lower leg: Edema (trace, ankle) present.     Left lower leg: Edema (trace, ankle) present.  Skin:    General: Skin is warm and dry.     Capillary Refill: Capillary refill takes less than 2 seconds.     Comments: Ecchymosis noted on bilateral upper arms without hematoma.   Neurological:     General: No focal deficit present.     Mental Status: She is alert and oriented to person, place, and time.  Psychiatric:        Mood and Affect: Mood normal.        Behavior: Behavior normal.     Laboratory examination:   Recent Labs    03/05/20 1013 03/06/20 0055 03/07/20 0215  NA 139 137 140  K 3.7 3.8 3.8  CL 106 107 107  CO2 21* 20* 22  GLUCOSE 129* 108*  110*  BUN 14 13 18   CREATININE 0.80 0.90 1.14*  CALCIUM 9.4 8.5* 8.9  GFRNONAA >60 >60 47*   estimated creatinine clearance is 34.8 mL/min (A) (by C-G formula based on SCr of 1.14 mg/dL (H)).  CMP Latest Ref Rng & Units 03/07/2020 03/06/2020 03/05/2020  Glucose 70 - 99 mg/dL 110(H) 108(H) 129(H)  BUN 8 - 23 mg/dL 18 13 14   Creatinine 0.44 - 1.00 mg/dL 1.14(H) 0.90 0.80  Sodium 135 - 145 mmol/L 140 137 139  Potassium 3.5 - 5.1 mmol/L 3.8 3.8 3.7  Chloride 98 - 111 mmol/L 107 107 106  CO2 22 - 32 mmol/L 22 20(L) 21(L)  Calcium 8.9 - 10.3 mg/dL 8.9 8.5(L) 9.4  Total Protein 6.5 - 8.1 g/dL - - 6.8  Total Bilirubin 0.3 - 1.2 mg/dL - - 0.7  Alkaline Phos 38 - 126 U/L - - 51  AST 15 - 41 U/L - - 22  ALT 0 - 44 U/L - - 16   CBC Latest Ref Rng & Units 03/07/2020 03/06/2020 03/05/2020  WBC 4.0 - 10.5 K/uL 7.9 8.2 9.6  Hemoglobin 12.0 - 15.0 g/dL 11.7(L) 10.8(L) 12.8  Hematocrit 36.0 - 46.0 % 35.6(L) 32.8(L) 36.1  Platelets 150 - 400 K/uL 151 144(L) 169    Lipid Panel Recent Labs    07/19/19 1613 09/25/19 1458 03/05/20 1013  CHOL 300* 194 194  TRIG 260* 107.0 146  LDLCALC 207* 119* 114*  VLDL  --  21.4 29  HDL 47* 53.40 51  CHOLHDL 6.4* 4 3.8    HEMOGLOBIN A1C Lab Results  Component Value Date   HGBA1C 6.3 09/25/2019   TSH Recent Labs    09/25/19 1458 03/05/20 1520  TSH 2.47 2.002    External labs:   None   Medications and allergies  No Known Allergies   Outpatient Medications Prior to Visit  Medication Sig Dispense Refill   aspirin 81 MG EC tablet Take 81 mg by mouth daily.     cetirizine (ZYRTEC) 10 MG tablet Take 10 mg by mouth daily.     Cholecalciferol (VITAMIN D3) 2000 UNITS TABS Take 1 tablet by mouth every evening.     Cyanocobalamin (  VITAMIN B 12 PO) Take 1 tablet by mouth every evening.     ezetimibe (ZETIA) 10 MG tablet Take 1 tablet (10 mg total) by mouth daily after supper. 30 tablet 2   hydrocortisone 2.5 % lotion Apply topically 2 (two) times daily.  59 mL 0   levothyroxine (SYNTHROID) 75 MCG tablet TAKE 1 TABLET BY MOUTH ONCE A DAY ON AN EMPTY STOMACH.TAKE WITH A GLASS OF WATERAT LEAST 30-60 MIN BEFORE BREAKFAST (Patient taking differently: Take 75 mcg by mouth daily before breakfast.) 90 tablet 1   nitroGLYCERIN (NITROSTAT) 0.4 MG SL tablet Place 1 tablet (0.4 mg total) under the tongue every 5 (five) minutes as needed for up to 25 days for chest pain. 25 tablet 3   pantoprazole (PROTONIX) 20 MG tablet TAKE 1 TABLET BY MOUTH TWICE A DAY (Patient taking differently: Take 20 mg by mouth daily.) 180 tablet 2   CRESTOR 20 MG tablet Take 1 tablet (20 mg total) by mouth daily. (Patient taking differently: Take 20 mg by mouth every evening.) 90 tablet 1   lisinopril (ZESTRIL) 20 MG tablet TAKE 1 TABLET BY MOUTH ONCE A DAY (Patient taking differently: Take 20 mg by mouth daily at 12 noon.) 90 tablet 0   metoprolol tartrate (LOPRESSOR) 25 MG tablet Take 1 tablet (25 mg total) by mouth 2 (two) times daily. 60 tablet 1   ticagrelor (BRILINTA) 90 MG TABS tablet Take 1 tablet (90 mg total) by mouth 2 (two) times daily. 60 tablet 0   No facility-administered medications prior to visit.     Radiology:   No results found.  Cardiac Studies:   Echocardiogram 03/05/2020:   1. Due to mitral and calcification, diastolic function cannot be estimated. However appears to show pseudonormal pattern.. Left ventricular ejection fraction, by estimation, is 50 to 55%. The left ventricle has low normal function. The left ventricle  demonstrates regional wall motion abnormalities (see scoring diagram/findings for description). There is mild left ventricular hypertrophy. Left ventricular diastolic parameters are indeterminate. There is mild of the left ventricular, entire inferior  wall. 2. Right ventricular systolic function is normal. The right ventricular size is normal. There is mildly elevated pulmonary artery systolic pressure. 3. Left atrial size was  mildly dilated. 4. The mitral valve is normal in structure. Trivial mitral valve regurgitation. Moderate mitral annular calcification. 5. The aortic valve is tricuspid. There is mild calcification of the aortic valve. Aortic valve regurgitation is trivial. Mild aortic valve sclerosis is present, with no evidence of aortic valve stenosis. 6. The inferior vena cava is normal in size with <50% respiratory variability, suggesting right atrial pressure of 8 mmHg.  Left Heart Catheterization and stenting to RCA 03/05/20: LV: Normal size. Inferior akinesis. LVEF 40%. No significant MR. Severe mitral annular calcification evident. No pressure gradient across the aortic valve. LM: Large vessel, mildly calcified. CX: Very large vessel giving origin to large OM1 small OM 2 very large OM 3. Ostium of the circumflex has a 30% stenosis. Very large OM1 with high-grade and irregular marginal 95% stenosis in the proximal segment. Moderate amount of calcification is evident in the coronary vessels. Severe tortuosity is evident in the high OM1 and also large OM 3. LAD: Large caliber vessel giving origin to a moderate to large size D1. Mild luminal irregularities evident. RCA: Large vessel and dominant vessel. Tortuous in the mid to distal segment. It is occluded the proximal segment. Successful aspiration thrombectomy with Pronto V4 catheter followed by stenting with 4.0 x 30  mm resolute Onyx DES, stenosis reduced from 100% to 0% and TIMI 0 to TIMI-3 flow.  Coronary angioplasty to OM1 03/06/2020: OCT guided PTCA and stenting of the large OM1 with 2.5 x 15 mm resolute Onyx DES, stenosis reduced from 99% to 0%, >95% stent apposition and expansion. No edge dissection.  Recommendation: Patient will hopefully be discharged home tomorrow if she remains stable. She will need dual antiplatelet therapy for a year in view of ACS. Continue risk factor modification is indicated. 115 mL contrast  utilized.  EKG:   EKG 03/13/2020: Sinus bradycardia rate of 52 bpm, with borderline first-degree AV block..  Inferior infarct old.  Poor R wave progression, cannot exclude anteroseptal infarct old.  T wave abnormality, unchanged from previous.  Low voltage complexes, consider pulmonary disease pattern.  EKG 03/07/2020: Normal sinus rhythm with rate of 73 bpm, normal axis, T wave abnormality, consider inferolateral ischemia.  Compared to 03/06/2020, no significant change.  Assessment     ICD-10-CM   1. Coronary artery disease involving native coronary artery of native heart without angina pectoris  I25.10 EKG 12-Lead    CRESTOR 20 MG tablet    lisinopril (ZESTRIL) 20 MG tablet    metoprolol tartrate (LOPRESSOR) 25 MG tablet    ticagrelor (BRILINTA) 90 MG TABS tablet  2. Essential hypertension  I10 lisinopril (ZESTRIL) 20 MG tablet    metoprolol tartrate (LOPRESSOR) 25 MG tablet  3. Hypercholesterolemia  E78.00 CRESTOR 20 MG tablet  4. History of ST elevation myocardial infarction (STEMI)  I25.2 EKG 12-Lead    ticagrelor (BRILINTA) 90 MG TABS tablet     Medications Discontinued During This Encounter  Medication Reason   CRESTOR 20 MG tablet Reorder   lisinopril (ZESTRIL) 20 MG tablet Reorder   metoprolol tartrate (LOPRESSOR) 25 MG tablet Reorder   ticagrelor (BRILINTA) 90 MG TABS tablet Reorder    Meds ordered this encounter  Medications   CRESTOR 20 MG tablet    Sig: Take 1 tablet (20 mg total) by mouth daily.    Dispense:  90 tablet    Refill:  3   lisinopril (ZESTRIL) 20 MG tablet    Sig: Take 1 tablet (20 mg total) by mouth daily.    Dispense:  90 tablet    Refill:  3   metoprolol tartrate (LOPRESSOR) 25 MG tablet    Sig: Take 1 tablet (25 mg total) by mouth 2 (two) times daily.    Dispense:  180 tablet    Refill:  3   ticagrelor (BRILINTA) 90 MG TABS tablet    Sig: Take 1 tablet (90 mg total) by mouth 2 (two) times daily.    Dispense:  180 tablet    Refill:  3     Recommendations:   Amy Lin is a 87 y.o. Caucasian female with history of hypertension, hyperlipidemia, GERD who presented to [facility] emergency department 03/05/2020 with active chest pain and EKG revealing inferior STEMI. Recommend patient continue DAPT until at least 03/07/2021 if tolerated.   Patient present for follow up after STEMI on 03/05/20. She is feeling well overall and is tolerating guideline direct medical therapy. She has had 2 episodes of chest pain since discharge with atypical features. S/L NTG was explained how to and when to use it and to notify us if there is change in frequency of use, she verbalized understanding. Suspect patient's fatigue and dyspnea to be related to deconditioning. Recommend starting cardiac rehab, as this will likely improve. Encouraged patient to monitor  blood pressure daily and bring a written log with her to her next office visit, she verbalized agreement. Patient is not orthostatic today. There are no clinical sings of heart failure and blood pressure is well controlled. She is tolerating DAPT without bleeding diathesis. Will continue current guideline directed medical therapy including aspirin, Crestor, lisinopril, Lopressor, as well as Brilinta and Zetia. Will recheck lipid panel in 3 months.   Patient does admit to relatively high salt intake in her diet. Counseled patient regarding diet and lifestyle modifications to reduce cardiovascular risks.   Follow up in 6 weeks, sooner if needed, for CAD, hypertension, and hyperlipidemia.   During this visit I reviewed and updated: Tobacco history   allergies  medication reconciliation   medical history   surgical history   family history   social history.  This note was created using a voice recognition software as a result there may be grammatical errors inadvertently enclosed that do not reflect the nature of this encounter. Every attempt is made to correct such errors.   Alethia Berthold,  PA-C 03/13/2020, 2:55 PM Office: [phone]

## 2020-03-13 ENCOUNTER — Encounter: Payer: Self-pay | Admitting: Student

## 2020-03-13 ENCOUNTER — Other Ambulatory Visit: Payer: Self-pay

## 2020-03-13 ENCOUNTER — Ambulatory Visit: Payer: PPO | Admitting: Student

## 2020-03-13 VITALS — BP 131/67 | HR 53 | Temp 95.6°F | Resp 16 | Ht 63.4 in | Wt 154.6 lb

## 2020-03-13 DIAGNOSIS — I2510 Atherosclerotic heart disease of native coronary artery without angina pectoris: Secondary | ICD-10-CM | POA: Diagnosis not present

## 2020-03-13 DIAGNOSIS — I10 Essential (primary) hypertension: Secondary | ICD-10-CM | POA: Diagnosis not present

## 2020-03-13 DIAGNOSIS — E7800 Pure hypercholesterolemia, unspecified: Secondary | ICD-10-CM

## 2020-03-13 DIAGNOSIS — I252 Old myocardial infarction: Secondary | ICD-10-CM | POA: Diagnosis not present

## 2020-03-13 MED ORDER — LISINOPRIL 20 MG PO TABS: 20.0000 mg | ORAL_TABLET | Freq: Every day | ORAL | 3 refills | Status: DC

## 2020-03-13 MED ORDER — TICAGRELOR 90 MG PO TABS: 90.0000 mg | ORAL_TABLET | Freq: Two times a day (BID) | ORAL | 3 refills | Status: DC

## 2020-03-13 MED ORDER — METOPROLOL TARTRATE 25 MG PO TABS: 25.0000 mg | ORAL_TABLET | Freq: Two times a day (BID) | ORAL | 3 refills | Status: DC

## 2020-03-13 MED ORDER — CRESTOR 20 MG PO TABS: 20.0000 mg | ORAL_TABLET | Freq: Every day | ORAL | 3 refills | Status: DC

## 2020-03-19 ENCOUNTER — Telehealth: Payer: Self-pay

## 2020-03-19 NOTE — Telephone Encounter (Signed)
Pt called stating that she had a heart attack two weeks ago and is experiencing heart burn that has not gone away all day. She also is stating that she has been getting sinus headaches. She is unsure what medication she can taking for these two symptoms because she is afraid to mix any medications.

## 2020-03-19 NOTE — Telephone Encounter (Signed)
Spoke with patient, symptoms are suggestive of GERD, and have relieved since this morning with Protonix. Advised her to continue to take Protonix as prescribed. Also advised her that she is able to take Tylenol occasionally as needed for headache.

## 2020-03-20 ENCOUNTER — Encounter: Payer: PPO | Attending: Cardiology | Admitting: *Deleted

## 2020-03-20 ENCOUNTER — Other Ambulatory Visit: Payer: Self-pay

## 2020-03-20 DIAGNOSIS — I213 ST elevation (STEMI) myocardial infarction of unspecified site: Secondary | ICD-10-CM

## 2020-03-20 DIAGNOSIS — Z955 Presence of coronary angioplasty implant and graft: Secondary | ICD-10-CM | POA: Insufficient documentation

## 2020-03-20 NOTE — Progress Notes (Signed)
Initial telephone orientation completed. Diagnosis can be found in CHL 2/3. EP orientation scheduled for 2/28 at 10am.

## 2020-03-24 ENCOUNTER — Telehealth: Payer: Self-pay

## 2020-03-24 NOTE — Telephone Encounter (Signed)
She can slowly return to usual activity as tolerated. Avoid heavy lifting or vigorous activity.  Also please verify she is scheduled to start cardiac rehab

## 2020-03-25 NOTE — Telephone Encounter (Signed)
Pt is starting rehab on Monday 03/30/20

## 2020-03-30 ENCOUNTER — Encounter: Payer: PPO | Admitting: *Deleted

## 2020-03-30 ENCOUNTER — Other Ambulatory Visit: Payer: Self-pay

## 2020-03-30 VITALS — Ht 64.2 in | Wt 154.2 lb

## 2020-03-30 DIAGNOSIS — I213 ST elevation (STEMI) myocardial infarction of unspecified site: Secondary | ICD-10-CM

## 2020-03-30 DIAGNOSIS — Z955 Presence of coronary angioplasty implant and graft: Secondary | ICD-10-CM | POA: Diagnosis not present

## 2020-03-30 NOTE — Patient Instructions (Signed)
Patient Instructions  Patient Details  Name: Amy Lin MRN: [id number] Date of Birth: 02/03/1933 Referring Provider:  Adrian Prows, MD  Below are your personal goals for exercise, nutrition, and risk factors. Our goal is to help you stay on track towards obtaining and maintaining these goals. We will be discussing your progress on these goals with you throughout the program.  Initial Exercise Prescription:  Initial Exercise Prescription - 03/30/20 1100      Date of Initial Exercise RX and Referring Provider   Date 03/30/20    Referring Provider Clayton Lefort MD      Treadmill   MPH 1.8    Grade 0.5    Minutes 15    METs 2.5      Recumbant Elliptical   Level 1    RPM 50    Minutes 15    METs 2      REL-XR   Level 1    Speed 50    Minutes 15    METs 2      T5 Nustep   Level 2    SPM 80    Minutes 15    METs 2      Prescription Details   Frequency (times per week) 2    Duration Progress to 30 minutes of continuous aerobic without signs/symptoms of physical distress      Intensity   THRR 40-80% of Max Heartrate 85-119    Ratings of Perceived Exertion 11-13    Perceived Dyspnea 0-4      Progression   Progression Continue to progress workloads to maintain intensity without signs/symptoms of physical distress.      Resistance Training   Training Prescription Yes    Weight 3 lb    Reps 10-15           Exercise Goals: Frequency: Be able to perform aerobic exercise two to three times per week in program working toward 2-5 days per week of home exercise.  Intensity: Work with a perceived exertion of 11 (fairly light) - 15 (hard) while following your exercise prescription.  We will make changes to your prescription with you as you progress through the program.   Duration: Be able to do 30 to 45 minutes of continuous aerobic exercise in addition to a 5 minute warm-up and a 5 minute cool-down routine.   Nutrition Goals: Your personal nutrition goals will be  established when you do your nutrition analysis with the dietician.  The following are general nutrition guidelines to follow: Cholesterol < 200mg /day Sodium < 1500mg /day Fiber: Women over 50 yrs - 21 grams per day  Personal Goals:  Personal Goals and Risk Factors at Admission - 03/30/20 1219      Core Components/Risk Factors/Patient Goals on Admission    Weight Management Yes;Weight Loss    Intervention Weight Management: Develop a combined nutrition and exercise program designed to reach desired caloric intake, while maintaining appropriate intake of nutrient and fiber, sodium and fats, and appropriate energy expenditure required for the weight goal.;Weight Management: Provide education and appropriate resources to help participant work on and attain dietary goals.;Weight Management/Obesity: Establish reasonable short term and long term weight goals.;Obesity: Provide education and appropriate resources to help participant work on and attain dietary goals.    Admit Weight 154 lb 3.2 oz (69.9 kg)    Goal Weight: Short Term 150 lb (68 kg)    Goal Weight: Long Term 150 lb (68 kg)    Expected Outcomes Short Term: Continue to  assess and modify interventions until short term weight is achieved;Long Term: Adherence to nutrition and physical activity/exercise program aimed toward attainment of established weight goal;Weight Loss: Understanding of general recommendations for a balanced deficit meal plan, which promotes 1-2 lb weight loss per week and includes a negative energy balance of [phone] kcal/d;Understanding recommendations for meals to include 15-35% energy as protein, 25-35% energy from fat, 35-60% energy from carbohydrates, less than 200mg  of dietary cholesterol, 20-35 gm of total fiber daily;Understanding of distribution of calorie intake throughout the day with the consumption of 4-5 meals/snacks    Hypertension Yes    Intervention Provide education on lifestyle modifcations including regular  physical activity/exercise, weight management, moderate sodium restriction and increased consumption of fresh fruit, vegetables, and low fat dairy, alcohol moderation, and smoking cessation.;Monitor prescription use compliance.    Expected Outcomes Short Term: Continued assessment and intervention until BP is < 140/47mm HG in hypertensive participants. < 130/66mm HG in hypertensive participants with diabetes, heart failure or chronic kidney disease.;Long Term: Maintenance of blood pressure at goal levels.    Lipids Yes    Intervention Provide education and support for participant on nutrition & aerobic/resistive exercise along with prescribed medications to achieve LDL 70mg , HDL >40mg .    Expected Outcomes Short Term: Participant states understanding of desired cholesterol values and is compliant with medications prescribed. Participant is following exercise prescription and nutrition guidelines.;Long Term: Cholesterol controlled with medications as prescribed, with individualized exercise RX and with personalized nutrition plan. Value goals: LDL < 70mg , HDL > 40 mg.           Tobacco Use Initial Evaluation: Social History   Tobacco Use  Smoking Status Never Smoker  Smokeless Tobacco Never Used    Exercise Goals and Review:  Exercise Goals    Row Name 03/30/20 1157             Exercise Goals   Increase Physical Activity Yes       Intervention Provide advice, education, support and counseling about physical activity/exercise needs.;Develop an individualized exercise prescription for aerobic and resistive training based on initial evaluation findings, risk stratification, comorbidities and participant's personal goals.       Expected Outcomes Short Term: Attend rehab on a regular basis to increase amount of physical activity.;Long Term: Add in home exercise to make exercise part of routine and to increase amount of physical activity.;Long Term: Exercising regularly at least 3-5 days a  week.       Increase Strength and Stamina Yes       Intervention Provide advice, education, support and counseling about physical activity/exercise needs.;Develop an individualized exercise prescription for aerobic and resistive training based on initial evaluation findings, risk stratification, comorbidities and participant's personal goals.       Expected Outcomes Short Term: Increase workloads from initial exercise prescription for resistance, speed, and METs.;Short Term: Perform resistance training exercises routinely during rehab and add in resistance training at home;Long Term: Improve cardiorespiratory fitness, muscular endurance and strength as measured by increased METs and functional capacity (6MWT)       Able to understand and use rate of perceived exertion (RPE) scale Yes       Intervention Provide education and explanation on how to use RPE scale       Expected Outcomes Short Term: Able to use RPE daily in rehab to express subjective intensity level;Long Term:  Able to use RPE to guide intensity level when exercising independently       Able to understand  and use Dyspnea scale Yes       Intervention Provide education and explanation on how to use Dyspnea scale       Expected Outcomes Short Term: Able to use Dyspnea scale daily in rehab to express subjective sense of shortness of breath during exertion;Long Term: Able to use Dyspnea scale to guide intensity level when exercising independently       Knowledge and understanding of Target Heart Rate Range (THRR) Yes       Intervention Provide education and explanation of THRR including how the numbers were predicted and where they are located for reference       Expected Outcomes Short Term: Able to state/look up THRR;Short Term: Able to use daily as guideline for intensity in rehab;Long Term: Able to use THRR to govern intensity when exercising independently       Able to check pulse independently Yes       Intervention Provide education and  demonstration on how to check pulse in carotid and radial arteries.;Review the importance of being able to check your own pulse for safety during independent exercise       Expected Outcomes Short Term: Able to explain why pulse checking is important during independent exercise;Long Term: Able to check pulse independently and accurately       Understanding of Exercise Prescription Yes       Intervention Provide education, explanation, and written materials on patient's individual exercise prescription       Expected Outcomes Short Term: Able to explain program exercise prescription;Long Term: Able to explain home exercise prescription to exercise independently              Copy of goals given to participant.

## 2020-03-30 NOTE — Progress Notes (Signed)
Cardiac Individual Treatment Plan  Patient Details  Name: Amy Lin MRN: [id number] Date of Birth: Feb 01, 1934 Referring Provider:   Flowsheet Row Cardiac Rehab from 03/30/2020 in [facility] Cardiac and Pulmonary Rehab  Referring Provider Clayton Lefort MD      Initial Encounter Date:  Flowsheet Row Cardiac Rehab from 03/30/2020 in [facility] Cardiac and Pulmonary Rehab  Date 03/30/20      Visit Diagnosis: ST elevation myocardial infarction (STEMI), unspecified artery [facility])  Status post coronary artery stent placement  Patient's Home Medications on Admission:  Current Outpatient Medications:  .  aspirin 81 MG EC tablet, Take 81 mg by mouth daily., Disp: , Rfl:  .  cetirizine (ZYRTEC) 10 MG tablet, Take 10 mg by mouth daily., Disp: , Rfl:  .  Cholecalciferol (VITAMIN D3) 2000 UNITS TABS, Take 1 tablet by mouth every evening., Disp: , Rfl:  .  CRESTOR 20 MG tablet, Take 1 tablet (20 mg total) by mouth daily., Disp: 90 tablet, Rfl: 3 .  Cyanocobalamin (VITAMIN B 12 PO), Take 1 tablet by mouth every evening., Disp: , Rfl:  .  ezetimibe (ZETIA) 10 MG tablet, Take 1 tablet (10 mg total) by mouth daily after supper., Disp: 30 tablet, Rfl: 2 .  hydrocortisone 2.5 % lotion, Apply topically 2 (two) times daily., Disp: 59 mL, Rfl: 0 .  levothyroxine (SYNTHROID) 75 MCG tablet, TAKE 1 TABLET BY MOUTH ONCE A DAY ON AN EMPTY STOMACH.TAKE WITH A GLASS OF WATERAT LEAST 30-60 MIN BEFORE BREAKFAST (Patient taking differently: Take 75 mcg by mouth daily before breakfast.), Disp: 90 tablet, Rfl: 1 .  lisinopril (ZESTRIL) 20 MG tablet, Take 1 tablet (20 mg total) by mouth daily., Disp: 90 tablet, Rfl: 3 .  metoprolol tartrate (LOPRESSOR) 25 MG tablet, Take 1 tablet (25 mg total) by mouth 2 (two) times daily., Disp: 180 tablet, Rfl: 3 .  nitroGLYCERIN (NITROSTAT) 0.4 MG SL tablet, Place 1 tablet (0.4 mg total) under the tongue every 5 (five) minutes as needed for up to 25 days for chest pain., Disp: 25 tablet, Rfl: 3 .   pantoprazole (PROTONIX) 20 MG tablet, TAKE 1 TABLET BY MOUTH TWICE A DAY (Patient taking differently: Take 20 mg by mouth daily.), Disp: 180 tablet, Rfl: 2 .  ticagrelor (BRILINTA) 90 MG TABS tablet, Take 1 tablet (90 mg total) by mouth 2 (two) times daily., Disp: 180 tablet, Rfl: 3  Past Medical History: Past Medical History:  Diagnosis Date  . Allergy   . Coronary artery disease   . GERD (gastroesophageal reflux disease)   . Hyperlipidemia   . Hypertension    diuretic for edema at first  . Hypothyroidism   . Myocardial infarction ([place])   . Osteoarthritis, multiple sites   . Osteoporosis     Tobacco Use: Social History   Tobacco Use  Smoking Status Never Smoker  Smokeless Tobacco Never Used    Labs: Recent Review Flowsheet Data    Labs for ITP Cardiac and Pulmonary Rehab Latest Ref Rng & Units 07/20/2017 09/13/2018 07/19/2019 09/25/2019 03/05/2020   Cholestrol 0 - 200 mg/dL 251(H) 304(H) 300(H) 194 194   LDLCALC 0 - 99 mg/dL 178(H) 237(H) 207(H) 119(H) 114(H)   LDLDIRECT mg/dL - - - - -   HDL >40 mg/dL 50.80 50.60 47(L) 53.40 51   Trlycerides <150 mg/dL 110.0 84.0 260(H) 107.0 146   Hemoglobin A1c 4.6 - 6.5 % - - - 6.3 -   TCO2 22 - 32 mmol/L - - - - 22  Exercise Target Goals: Exercise Program Goal: Individual exercise prescription set using results from initial 6 min walk test and THRR while considering  patient's activity barriers and safety.   Exercise Prescription Goal: Initial exercise prescription builds to 30-45 minutes a day of aerobic activity, 2-3 days per week.  Home exercise guidelines will be given to patient during program as part of exercise prescription that the participant will acknowledge.   Education: Aerobic Exercise: - Group verbal and visual presentation on the components of exercise prescription. Introduces F.I.T.T principle from ACSM for exercise prescriptions.  Reviews F.I.T.T. principles of aerobic exercise including progression. Written  material given at graduation.   Education: Resistance Exercise: - Group verbal and visual presentation on the components of exercise prescription. Introduces F.I.T.T principle from ACSM for exercise prescriptions  Reviews F.I.T.T. principles of resistance exercise including progression. Written material given at graduation.    Education: Exercise & Equipment Safety: - Individual verbal instruction and demonstration of equipment use and safety with use of the equipment. Flowsheet Row Cardiac Rehab from 03/30/2020 in [facility] Cardiac and Pulmonary Rehab  Date 03/30/20  Educator [facility]  Instruction Review Code 1- Verbalizes Understanding      Education: Exercise Physiology & General Exercise Guidelines: - Group verbal and written instruction with models to review the exercise physiology of the cardiovascular system and associated critical values. Provides general exercise guidelines with specific guidelines to those with heart or lung disease.    Education: Flexibility, Balance, Mind/Body Relaxation: - Group verbal and visual presentation with interactive activity on the components of exercise prescription. Introduces F.I.T.T principle from ACSM for exercise prescriptions. Reviews F.I.T.T. principles of flexibility and balance exercise training including progression. Also discusses the mind body connection.  Reviews various relaxation techniques to help reduce and manage stress (i.e. Deep breathing, progressive muscle relaxation, and visualization). Balance handout provided to take home. Written material given at graduation.   Activity Barriers & Risk Stratification:  Activity Barriers & Cardiac Risk Stratification - 03/30/20 1150      Activity Barriers & Cardiac Risk Stratification   Activity Barriers Arthritis;Muscular Weakness;Deconditioning;Shortness of Breath;Balance Concerns   arthritis all over, big toes tend to have painful flare ups   Cardiac Risk Stratification Moderate           6  Minute Walk:  6 Minute Walk    Row Name 03/30/20 1149         6 Minute Walk   Distance 1100 feet     Walk Time 6 minutes     # of Rest Breaks 0     MPH 2.08     METS 1.7     RPE 11     Perceived Dyspnea  2     VO2 Peak 5.94     Symptoms Yes (comment)     Comments fatigue at end, SOB     Resting HR 51 bpm     Resting BP 118/66     Resting Oxygen Saturation  97 %     Exercise Oxygen Saturation  during 6 min walk 96 %     Max Ex. HR 88 bpm     Max Ex. BP 134/72     2 Minute Post BP 124/72            Oxygen Initial Assessment:   Oxygen Re-Evaluation:   Oxygen Discharge (Final Oxygen Re-Evaluation):   Initial Exercise Prescription:  Initial Exercise Prescription - 03/30/20 1100      Date of Initial Exercise RX and Referring  Provider   Date 03/30/20    Referring Provider Clayton Lefort MD      Treadmill   MPH 1.8    Grade 0.5    Minutes 15    METs 2.5      Recumbant Elliptical   Level 1    RPM 50    Minutes 15    METs 2      REL-XR   Level 1    Speed 50    Minutes 15    METs 2      T5 Nustep   Level 2    SPM 80    Minutes 15    METs 2      Prescription Details   Frequency (times per week) 2    Duration Progress to 30 minutes of continuous aerobic without signs/symptoms of physical distress      Intensity   THRR 40-80% of Max Heartrate 85-119    Ratings of Perceived Exertion 11-13    Perceived Dyspnea 0-4      Progression   Progression Continue to progress workloads to maintain intensity without signs/symptoms of physical distress.      Resistance Training   Training Prescription Yes    Weight 3 lb    Reps 10-15           Perform Capillary Blood Glucose checks as needed.  Exercise Prescription Changes:  Exercise Prescription Changes    Row Name 03/30/20 1100             Response to Exercise   Blood Pressure (Admit) 118/66       Blood Pressure (Exercise) 134/72       Blood Pressure (Exit) 124/72       Heart Rate (Admit) 51  bpm       Heart Rate (Exercise) 88 bpm       Heart Rate (Exit) 61 bpm       Oxygen Saturation (Admit) 97 %       Oxygen Saturation (Exercise) 96 %       Rating of Perceived Exertion (Exercise) 11       Perceived Dyspnea (Exercise) 2       Symptoms fatigue, SOB       Comments walk test results              Exercise Comments:   Exercise Goals and Review:  Exercise Goals    Row Name 03/30/20 1157             Exercise Goals   Increase Physical Activity Yes       Intervention Provide advice, education, support and counseling about physical activity/exercise needs.;Develop an individualized exercise prescription for aerobic and resistive training based on initial evaluation findings, risk stratification, comorbidities and participant's personal goals.       Expected Outcomes Short Term: Attend rehab on a regular basis to increase amount of physical activity.;Long Term: Add in home exercise to make exercise part of routine and to increase amount of physical activity.;Long Term: Exercising regularly at least 3-5 days a week.       Increase Strength and Stamina Yes       Intervention Provide advice, education, support and counseling about physical activity/exercise needs.;Develop an individualized exercise prescription for aerobic and resistive training based on initial evaluation findings, risk stratification, comorbidities and participant's personal goals.       Expected Outcomes Short Term: Increase workloads from initial exercise prescription for resistance, speed, and METs.;Short Term: Perform resistance training exercises  routinely during rehab and add in resistance training at home;Long Term: Improve cardiorespiratory fitness, muscular endurance and strength as measured by increased METs and functional capacity (6MWT)       Able to understand and use rate of perceived exertion (RPE) scale Yes       Intervention Provide education and explanation on how to use RPE scale       Expected  Outcomes Short Term: Able to use RPE daily in rehab to express subjective intensity level;Long Term:  Able to use RPE to guide intensity level when exercising independently       Able to understand and use Dyspnea scale Yes       Intervention Provide education and explanation on how to use Dyspnea scale       Expected Outcomes Short Term: Able to use Dyspnea scale daily in rehab to express subjective sense of shortness of breath during exertion;Long Term: Able to use Dyspnea scale to guide intensity level when exercising independently       Knowledge and understanding of Target Heart Rate Range (THRR) Yes       Intervention Provide education and explanation of THRR including how the numbers were predicted and where they are located for reference       Expected Outcomes Short Term: Able to state/look up THRR;Short Term: Able to use daily as guideline for intensity in rehab;Long Term: Able to use THRR to govern intensity when exercising independently       Able to check pulse independently Yes       Intervention Provide education and demonstration on how to check pulse in carotid and radial arteries.;Review the importance of being able to check your own pulse for safety during independent exercise       Expected Outcomes Short Term: Able to explain why pulse checking is important during independent exercise;Long Term: Able to check pulse independently and accurately       Understanding of Exercise Prescription Yes       Intervention Provide education, explanation, and written materials on patient's individual exercise prescription       Expected Outcomes Short Term: Able to explain program exercise prescription;Long Term: Able to explain home exercise prescription to exercise independently              Exercise Goals Re-Evaluation :   Discharge Exercise Prescription (Final Exercise Prescription Changes):  Exercise Prescription Changes - 03/30/20 1100      Response to Exercise   Blood Pressure  (Admit) 118/66    Blood Pressure (Exercise) 134/72    Blood Pressure (Exit) 124/72    Heart Rate (Admit) 51 bpm    Heart Rate (Exercise) 88 bpm    Heart Rate (Exit) 61 bpm    Oxygen Saturation (Admit) 97 %    Oxygen Saturation (Exercise) 96 %    Rating of Perceived Exertion (Exercise) 11    Perceived Dyspnea (Exercise) 2    Symptoms fatigue, SOB    Comments walk test results           Nutrition:  Target Goals: Understanding of nutrition guidelines, daily intake of sodium 1500mg , cholesterol 200mg , calories 30% from fat and 7% or less from saturated fats, daily to have 5 or more servings of fruits and vegetables.  Education: All About Nutrition: -Group instruction provided by verbal, written material, interactive activities, discussions, models, and posters to present general guidelines for heart healthy nutrition including fat, fiber, MyPlate, the role of sodium in heart healthy nutrition, utilization of the  nutrition label, and utilization of this knowledge for meal planning. Follow up email sent as well. Written material given at graduation.   Biometrics:  Pre Biometrics - 03/30/20 1158      Pre Biometrics   Height 5' 4.2" (1.631 m)    Weight 154 lb 3.2 oz (69.9 kg)    BMI (Calculated) 26.29    Single Leg Stand 3.8 seconds            Nutrition Therapy Plan and Nutrition Goals:  Nutrition Therapy & Goals - 03/30/20 1132      Nutrition Therapy   Diet Heart healthy, low Na    Drug/Food Interactions Statins/Certain Fruits    Protein (specify units) 55g    Fiber 25 grams    Whole Grain Foods 3 servings    Saturated Fats 12 max. grams    Fruits and Vegetables 8 servings/day    Sodium 1.5 grams      Personal Nutrition Goals   Nutrition Goal ST: switch to whole grain bread, include one extra serving of vegetables with lunch LT: eat at least 5 servings of fruits/vegetables per day, make at least half of grain eaten whole grains.    Comments B: scrambled egg (butter),  toast (white with jelly and butter), cantaloupe - 2 cups coffee (cream and sugar) L: fruit, sandwich, cheese toast (light lunch) D: vegetables, sweet potato - once per week, hamburger (1x/week), porkloin, chicken. Drinks: water. Discussed heart halthy eating and small changes.      Intervention Plan   Intervention Prescribe, educate and counsel regarding individualized specific dietary modifications aiming towards targeted core components such as weight, hypertension, lipid management, diabetes, heart failure and other comorbidities.;Nutrition handout(s) given to patient.    Expected Outcomes Short Term Goal: Understand basic principles of dietary content, such as calories, fat, sodium, cholesterol and nutrients.;Short Term Goal: A plan has been developed with personal nutrition goals set during dietitian appointment.;Long Term Goal: Adherence to prescribed nutrition plan.           Nutrition Assessments:  MEDIFICTS Score Key:  ?70 Need to make dietary changes   40-70 Heart Healthy Diet  ? 40 Therapeutic Level Cholesterol Diet  Flowsheet Row Cardiac Rehab from 03/30/2020 in [facility] Cardiac and Pulmonary Rehab  Picture Your Plate Total Score on Admission 66     Picture Your Plate Scores:  <23 Unhealthy dietary pattern with much room for improvement.  41-50 Dietary pattern unlikely to meet recommendations for good health and room for improvement.  51-60 More healthful dietary pattern, with some room for improvement.   >60 Healthy dietary pattern, although there may be some specific behaviors that could be improved.    Nutrition Goals Re-Evaluation:   Nutrition Goals Discharge (Final Nutrition Goals Re-Evaluation):   Psychosocial: Target Goals: Acknowledge presence or absence of significant depression and/or stress, maximize coping skills, provide positive support system. Participant is able to verbalize types and ability to use techniques and skills needed for reducing stress and  depression.   Education: Stress, Anxiety, and Depression - Group verbal and visual presentation to define topics covered.  Reviews how body is impacted by stress, anxiety, and depression.  Also discusses healthy ways to reduce stress and to treat/manage anxiety and depression.  Written material given at graduation.   Education: Sleep Hygiene -Provides group verbal and written instruction about how sleep can affect your health.  Define sleep hygiene, discuss sleep cycles and impact of sleep habits. Review good sleep hygiene tips.    Initial Review &  Psychosocial Screening:  Initial Psych Review & Screening - 03/20/20 1307      Initial Review   Current issues with Current Stress Concerns    Source of Stress Concerns Unable to participate in former interests or hobbies;Unable to perform yard/household activities      [place]? Yes   daughter     Barriers   Psychosocial barriers to participate in program There are no identifiable barriers or psychosocial needs.      Screening Interventions   Interventions Encouraged to exercise;Provide feedback about the scores to participant;To provide support and resources with identified psychosocial needs    Expected Outcomes Long Term Goal: Stressors or current issues are controlled or eliminated.;Short Term goal: Utilizing psychosocial counselor, staff and physician to assist with identification of specific Stressors or current issues interfering with healing process. Setting desired goal for each stressor or current issue identified.;Short Term goal: Identification and review with participant of any Quality of Life or Depression concerns found by scoring the questionnaire.;Long Term goal: The participant improves quality of Life and PHQ9 Scores as seen by post scores and/or verbalization of changes           Quality of Life Scores:   Quality of Life - 03/30/20 1216      Quality of Life   Select Quality of Life       Quality of Life Scores   Health/Function Pre 19 %    Socioeconomic Pre 24.86 %    Psych/Spiritual Pre 23.93 %    Family Pre 25.5 %    GLOBAL Pre 22.08 %          Scores of 19 and below usually indicate a poorer quality of life in these areas.  A difference of  2-3 points is a clinically meaningful difference.  A difference of 2-3 points in the total score of the Quality of Life Index has been associated with significant improvement in overall quality of life, self-image, physical symptoms, and general health in studies assessing change in quality of life.  PHQ-9: Recent Review Flowsheet Data    Depression screen [facility] 2/9 03/30/2020 09/25/2019 09/13/2018 07/16/2018 07/20/2017   Decreased Interest 1 0 0 0 0   Down, Depressed, Hopeless 1 0 0 0 0   PHQ - 2 Score 2 0 0 0 0   Altered sleeping 0 - 0 - -   Tired, decreased energy 2 - 0 - -   Change in appetite 1 - 0 - -   Feeling bad or failure about yourself  1 - 0 - -   Trouble concentrating 0 - 0 - -   Moving slowly or fidgety/restless 1 - 0 - -   Suicidal thoughts 0 - 0 - -   PHQ-9 Score 7 - 0 - -   Difficult doing work/chores Somewhat difficult - Not difficult at all - -     Interpretation of Total Score  Total Score Depression Severity:  1-4 = Minimal depression, 5-9 = Mild depression, 10-14 = Moderate depression, 15-19 = Moderately severe depression, 20-27 = Severe depression   Psychosocial Evaluation and Intervention:  Psychosocial Evaluation - 03/20/20 1331      Psychosocial Evaluation & Interventions   Interventions Encouraged to exercise with the program and follow exercise prescription    Comments Ms. Hession states she is slowly feeling better after her MI. She still feels very tired at times and think it may be her body adjusting to the medications. She  has started walking some around the house and is looking forward to becoming more active in Cardiac Rehab. Her daughter is very supportive and wants to be involved in her heart  healthy lifestyle. Ms. Ground doesn't report any new stress and her current stress level is "nothing she can't handle." She hopes to boost her stamina and remain independent as long as possible.    Expected Outcomes Short: attend cardiac rehab for education and exercise. Long: develop positive self care habits.    Continue Psychosocial Services  Follow up required by staff           Psychosocial Re-Evaluation:   Psychosocial Discharge (Final Psychosocial Re-Evaluation):   Vocational Rehabilitation: Provide vocational rehab assistance to qualifying candidates.   Vocational Rehab Evaluation & Intervention:  Vocational Rehab - 03/20/20 1307      Initial Vocational Rehab Evaluation & Intervention   Assessment shows need for Vocational Rehabilitation No           Education: Education Goals: Education classes will be provided on a variety of topics geared toward better understanding of heart health and risk factor modification. Participant will state understanding/return demonstration of topics presented as noted by education test scores.  Learning Barriers/Preferences:  Learning Barriers/Preferences - 03/20/20 1307      Learning Barriers/Preferences   Learning Barriers None    Learning Preferences None           General Cardiac Education Topics:  AED/CPR: - Group verbal and written instruction with the use of models to demonstrate the basic use of the AED with the basic ABC's of resuscitation.   Anatomy and Cardiac Procedures: - Group verbal and visual presentation and models provide information about basic cardiac anatomy and function. Reviews the testing methods done to diagnose heart disease and the outcomes of the test results. Describes the treatment choices: Medical Management, Angioplasty, or Coronary Bypass Surgery for treating various heart conditions including Myocardial Infarction, Angina, Valve Disease, and Cardiac Arrhythmias.  Written material given at  graduation.   Medication Safety: - Group verbal and visual instruction to review commonly prescribed medications for heart and lung disease. Reviews the medication, class of the drug, and side effects. Includes the steps to properly store meds and maintain the prescription regimen.  Written material given at graduation.   Intimacy: - Group verbal instruction through game format to discuss how heart and lung disease can affect sexual intimacy. Written material given at graduation..   Know Your Numbers and Heart Failure: - Group verbal and visual instruction to discuss disease risk factors for cardiac and pulmonary disease and treatment options.  Reviews associated critical values for Overweight/Obesity, Hypertension, Cholesterol, and Diabetes.  Discusses basics of heart failure: signs/symptoms and treatments.  Introduces Heart Failure Zone chart for action plan for heart failure.  Written material given at graduation.   Infection Prevention: - Provides verbal and written material to individual with discussion of infection control including proper hand washing and proper equipment cleaning during exercise session. Flowsheet Row Cardiac Rehab from 03/30/2020 in [facility] Cardiac and Pulmonary Rehab  Date 03/30/20  Educator [facility]  Instruction Review Code 1- Verbalizes Understanding      Falls Prevention: - Provides verbal and written material to individual with discussion of falls prevention and safety. Flowsheet Row Cardiac Rehab from 03/30/2020 in [facility] Cardiac and Pulmonary Rehab  Date 03/30/20  Educator [facility]  Instruction Review Code 1- Verbalizes Understanding      Other: -Provides group and verbal instruction on various topics (see comments)  Knowledge Questionnaire Score:  Knowledge Questionnaire Score - 03/30/20 1217      Knowledge Questionnaire Score   Pre Score 26/26           Core Components/Risk Factors/Patient Goals at Admission:  Personal Goals and Risk Factors at  Admission - 03/30/20 1219      Core Components/Risk Factors/Patient Goals on Admission    Weight Management Yes;Weight Loss    Intervention Weight Management: Develop a combined nutrition and exercise program designed to reach desired caloric intake, while maintaining appropriate intake of nutrient and fiber, sodium and fats, and appropriate energy expenditure required for the weight goal.;Weight Management: Provide education and appropriate resources to help participant work on and attain dietary goals.;Weight Management/Obesity: Establish reasonable short term and long term weight goals.;Obesity: Provide education and appropriate resources to help participant work on and attain dietary goals.    Admit Weight 154 lb 3.2 oz (69.9 kg)    Goal Weight: Short Term 150 lb (68 kg)    Goal Weight: Long Term 150 lb (68 kg)    Expected Outcomes Short Term: Continue to assess and modify interventions until short term weight is achieved;Long Term: Adherence to nutrition and physical activity/exercise program aimed toward attainment of established weight goal;Weight Loss: Understanding of general recommendations for a balanced deficit meal plan, which promotes 1-2 lb weight loss per week and includes a negative energy balance of [phone] kcal/d;Understanding recommendations for meals to include 15-35% energy as protein, 25-35% energy from fat, 35-60% energy from carbohydrates, less than 200mg  of dietary cholesterol, 20-35 gm of total fiber daily;Understanding of distribution of calorie intake throughout the day with the consumption of 4-5 meals/snacks    Hypertension Yes    Intervention Provide education on lifestyle modifcations including regular physical activity/exercise, weight management, moderate sodium restriction and increased consumption of fresh fruit, vegetables, and low fat dairy, alcohol moderation, and smoking cessation.;Monitor prescription use compliance.    Expected Outcomes Short Term: Continued  assessment and intervention until BP is < 140/60mm HG in hypertensive participants. < 130/4mm HG in hypertensive participants with diabetes, heart failure or chronic kidney disease.;Long Term: Maintenance of blood pressure at goal levels.    Lipids Yes    Intervention Provide education and support for participant on nutrition & aerobic/resistive exercise along with prescribed medications to achieve LDL 70mg , HDL >40mg .    Expected Outcomes Short Term: Participant states understanding of desired cholesterol values and is compliant with medications prescribed. Participant is following exercise prescription and nutrition guidelines.;Long Term: Cholesterol controlled with medications as prescribed, with individualized exercise RX and with personalized nutrition plan. Value goals: LDL < 70mg , HDL > 40 mg.           Education:Diabetes - Individual verbal and written instruction to review signs/symptoms of diabetes, desired ranges of glucose level fasting, after meals and with exercise. Acknowledge that pre and post exercise glucose checks will be done for 3 sessions at entry of program.   Core Components/Risk Factors/Patient Goals Review:    Core Components/Risk Factors/Patient Goals at Discharge (Final Review):    ITP Comments:  ITP Comments    Row Name 03/20/20 1322 03/30/20 1148         ITP Comments Initial telephone orientation completed. Diagnosis can be found in CH[facility] 2/3. EP orientation scheduled for 2/28 at 10am. Completed 6MWT and gym orientation. Initial ITP created and sent for review to Dr. [name] Filbert, Medical Director.             Comments: Initial ITP

## 2020-03-31 ENCOUNTER — Other Ambulatory Visit: Payer: Self-pay

## 2020-03-31 ENCOUNTER — Encounter: Payer: PPO | Attending: Cardiology | Admitting: *Deleted

## 2020-03-31 DIAGNOSIS — I213 ST elevation (STEMI) myocardial infarction of unspecified site: Secondary | ICD-10-CM | POA: Insufficient documentation

## 2020-03-31 DIAGNOSIS — Z955 Presence of coronary angioplasty implant and graft: Secondary | ICD-10-CM | POA: Insufficient documentation

## 2020-03-31 NOTE — Progress Notes (Signed)
Daily Session Note  Patient Details  Name: Amy Lin MRN: [id number] Date of Birth: 23-Feb-1933 Referring Provider:   Flowsheet Row Cardiac Rehab from 03/30/2020 in [facility] Cardiac and Pulmonary Rehab  Referring Provider Clayton Lefort MD      Encounter Date: 03/31/2020  Check In:  Session Check In - 03/31/20 1113      Check-In   Supervising physician immediately available to respond to emergencies See telemetry face sheet for immediately available ER MD    Location [facility]-Cardiac & Pulmonary Rehab    Staff Present Heath Lark, RN, BSN, CCRP;Joseph Foy [place], [place], ACSM CEP, Exercise Physiologist;Melissa Caiola RDN, LDN    Virtual Visit No    Medication changes reported     No    Fall or balance concerns reported    No    Warm-up and Cool-down Performed on first and last piece of equipment    Resistance Training Performed Yes    VAD Patient? No    PAD/SET Patient? No      Pain Assessment   Currently in Pain? No/denies              Social History   Tobacco Use  Smoking Status Never Smoker  Smokeless Tobacco Never Used    Goals Met:  Exercise tolerated well Personal goals reviewed No report of cardiac concerns or symptoms  Goals Unmet:  Not Applicable  Comments: First full day of exercise!  Patient was oriented to gym and equipment including functions, settings, policies, and procedures.  Patient's individual exercise prescription and treatment plan were reviewed.  All starting workloads were established based on the results of the 6 minute walk test done at initial orientation visit.  The plan for exercise progression was also introduced and progression will be customized based on patient's performance and goals.    Dr. [name] Filbert is Medical Director for [place] and [facility].

## 2020-04-02 ENCOUNTER — Other Ambulatory Visit: Payer: Self-pay

## 2020-04-02 DIAGNOSIS — I213 ST elevation (STEMI) myocardial infarction of unspecified site: Secondary | ICD-10-CM | POA: Diagnosis not present

## 2020-04-02 DIAGNOSIS — Z955 Presence of coronary angioplasty implant and graft: Secondary | ICD-10-CM

## 2020-04-02 NOTE — Progress Notes (Signed)
Daily Session Note  Patient Details  Name: Amy Lin MRN: [id number] Date of Birth: 1933/12/03 Referring Provider:   Flowsheet Row Cardiac Rehab from 03/30/2020 in [facility] Cardiac and Pulmonary Rehab  Referring Provider Clayton Lefort MD      Encounter Date: 04/02/2020  Check In:  Session Check In - 04/02/20 1035      Check-In   Supervising physician immediately available to respond to emergencies See telemetry face sheet for immediately available ER MD    Location [facility]-Cardiac & Pulmonary Rehab    Staff Present Birdie Sons, MPA, RN;Melissa Caiola RDN, LDN;Jessica Luan [place], MA, RCEP, CCRP, CCET    Virtual Visit No    Medication changes reported     No    Fall or balance concerns reported    No    Warm-up and Cool-down Performed on first and last piece of equipment    Resistance Training Performed Yes    VAD Patient? No    PAD/SET Patient? No      Pain Assessment   Currently in Pain? No/denies              Social History   Tobacco Use  Smoking Status Never Smoker  Smokeless Tobacco Never Used    Goals Met:  Independence with exercise equipment Exercise tolerated well No report of cardiac concerns or symptoms Strength training completed today  Goals Unmet:  Not Applicable  Comments: Pt able to follow exercise prescription today without complaint.  Will continue to monitor for progression.    Dr. [name] Filbert is Medical Director for [place] and [facility].

## 2020-04-07 ENCOUNTER — Other Ambulatory Visit: Payer: Self-pay

## 2020-04-07 DIAGNOSIS — I213 ST elevation (STEMI) myocardial infarction of unspecified site: Secondary | ICD-10-CM

## 2020-04-07 DIAGNOSIS — Z955 Presence of coronary angioplasty implant and graft: Secondary | ICD-10-CM

## 2020-04-07 NOTE — Progress Notes (Signed)
Daily Session Note  Patient Details  Name: Amy Lin MRN: [id number] Date of Birth: 06-16-1933 Referring Provider:   Flowsheet Row Cardiac Rehab from 03/30/2020 in [facility] Cardiac and Pulmonary Rehab  Referring Provider Clayton Lefort MD      Encounter Date: 04/07/2020  Check In:  Session Check In - 04/07/20 1113      Check-In   Supervising physician immediately available to respond to emergencies See telemetry face sheet for immediately available ER MD    Location [facility]-Cardiac & Pulmonary Rehab    Staff Present Birdie Sons, MPA, Elveria Rising, BA, ACSM CEP, Exercise Physiologist;Joseph Hood RCP,RRT,BSRT;Melissa Caiola RDN, LDN    Virtual Visit No    Medication changes reported     No    Fall or balance concerns reported    No    Warm-up and Cool-down Performed on first and last piece of equipment    Resistance Training Performed Yes    VAD Patient? No    PAD/SET Patient? No      Pain Assessment   Currently in Pain? No/denies              Social History   Tobacco Use  Smoking Status Never Smoker  Smokeless Tobacco Never Used    Goals Met:  Independence with exercise equipment Exercise tolerated well No report of cardiac concerns or symptoms Strength training completed today  Goals Unmet:  Not Applicable  Comments: Pt able to follow exercise prescription today without complaint.  Will continue to monitor for progression.    Dr. [name] Filbert is Medical Director for [place] and [facility].

## 2020-04-09 ENCOUNTER — Other Ambulatory Visit: Payer: Self-pay

## 2020-04-09 DIAGNOSIS — I213 ST elevation (STEMI) myocardial infarction of unspecified site: Secondary | ICD-10-CM

## 2020-04-09 DIAGNOSIS — Z955 Presence of coronary angioplasty implant and graft: Secondary | ICD-10-CM

## 2020-04-09 NOTE — Progress Notes (Signed)
Daily Session Note  Patient Details  Name: Amy Lin MRN: [id number] Date of Birth: [birth year] Referring Provider:   Flowsheet Row Cardiac Rehab from 03/30/2020 in [facility] Cardiac and Pulmonary Rehab  Referring Provider Clayton Lefort MD      Encounter Date: 04/09/2020  Check In:  Session Check In - 04/09/20 1027      Check-In   Supervising physician immediately available to respond to emergencies See telemetry face sheet for immediately available ER MD    Location [facility]-Cardiac & Pulmonary Rehab    Staff Present Birdie Sons, MPA, Elveria Rising, BA, ACSM CEP, Exercise Physiologist;Joseph Tessie Fass RCP,RRT,BSRT    Virtual Visit No    Medication changes reported     No    Fall or balance concerns reported    No    Warm-up and Cool-down Performed on first and last piece of equipment    Resistance Training Performed Yes    VAD Patient? No    PAD/SET Patient? No      Pain Assessment   Currently in Pain? No/denies              Social History   Tobacco Use  Smoking Status Never Smoker  Smokeless Tobacco Never Used    Goals Met:  Independence with exercise equipment Exercise tolerated well No report of cardiac concerns or symptoms Strength training completed today  Goals Unmet:  Not Applicable  Comments: Pt able to follow exercise prescription today without complaint.  Will continue to monitor for progression.    Dr. [name] Filbert is Medical Director for [place] and [facility].

## 2020-04-14 ENCOUNTER — Other Ambulatory Visit: Payer: Self-pay

## 2020-04-14 DIAGNOSIS — I213 ST elevation (STEMI) myocardial infarction of unspecified site: Secondary | ICD-10-CM | POA: Diagnosis not present

## 2020-04-14 DIAGNOSIS — Z955 Presence of coronary angioplasty implant and graft: Secondary | ICD-10-CM

## 2020-04-14 NOTE — Progress Notes (Signed)
Daily Session Note  Patient Details  Name: Amy Lin MRN: [id number] Date of Birth: [birth year] Referring Provider:   Flowsheet Row Cardiac Rehab from 03/30/2020 in [facility] Cardiac and Pulmonary Rehab  Referring Provider Clayton Lefort MD      Encounter Date: 04/14/2020  Check In:  Session Check In - 04/14/20 1112      Check-In   Supervising physician immediately available to respond to emergencies See telemetry face sheet for immediately available ER MD    Location [facility]-Cardiac & Pulmonary Rehab    Staff Present Birdie Sons, MPA, Elveria Rising, BA, ACSM CEP, Exercise Physiologist;Melissa Caiola RDN, LDN    Virtual Visit No    Medication changes reported     No    Fall or balance concerns reported    No    Warm-up and Cool-down Performed on first and last piece of equipment    Resistance Training Performed Yes    VAD Patient? No    PAD/SET Patient? No      Pain Assessment   Currently in Pain? No/denies              Social History   Tobacco Use  Smoking Status Never Smoker  Smokeless Tobacco Never Used    Goals Met:  Independence with exercise equipment Exercise tolerated well No report of cardiac concerns or symptoms Strength training completed today  Goals Unmet:  Not Applicable  Comments: Pt able to follow exercise prescription today without complaint.  Will continue to monitor for progression.    Dr. [name] Filbert is Medical Director for [place] and [facility].

## 2020-04-16 ENCOUNTER — Other Ambulatory Visit: Payer: Self-pay | Admitting: Internal Medicine

## 2020-04-16 ENCOUNTER — Other Ambulatory Visit: Payer: Self-pay

## 2020-04-16 DIAGNOSIS — E7800 Pure hypercholesterolemia, unspecified: Secondary | ICD-10-CM

## 2020-04-16 DIAGNOSIS — I2510 Atherosclerotic heart disease of native coronary artery without angina pectoris: Secondary | ICD-10-CM

## 2020-04-16 DIAGNOSIS — I213 ST elevation (STEMI) myocardial infarction of unspecified site: Secondary | ICD-10-CM | POA: Diagnosis not present

## 2020-04-16 DIAGNOSIS — Z955 Presence of coronary angioplasty implant and graft: Secondary | ICD-10-CM

## 2020-04-16 NOTE — Progress Notes (Signed)
Daily Session Note  Patient Details  Name: Amy Lin MRN: [id number] Date of Birth: [birth year] Referring Provider:   Flowsheet Row Cardiac Rehab from 03/30/2020 in [facility] Cardiac and Pulmonary Rehab  Referring Provider Clayton Lefort MD      Encounter Date: 04/16/2020  Check In:  Session Check In - 04/16/20 1111      Check-In   Supervising physician immediately available to respond to emergencies See telemetry face sheet for immediately available ER MD    Location [facility]-Cardiac & Pulmonary Rehab    Staff Present Birdie Sons, MPA, RN;Melissa Caiola RDN, Rowe Pavy, BA, ACSM CEP, Exercise Physiologist    Virtual Visit No    Medication changes reported     No    Fall or balance concerns reported    No    Warm-up and Cool-down Performed on first and last piece of equipment    Resistance Training Performed Yes    VAD Patient? No    PAD/SET Patient? No      Pain Assessment   Currently in Pain? No/denies              Social History   Tobacco Use  Smoking Status Never Smoker  Smokeless Tobacco Never Used    Goals Met:  Independence with exercise equipment Exercise tolerated well No report of cardiac concerns or symptoms Strength training completed today  Goals Unmet:  Not Applicable  Comments: Pt able to follow exercise prescription today without complaint.  Will continue to monitor for progression.    Dr. [name] Filbert is Medical Director for [place] and [facility].

## 2020-04-20 ENCOUNTER — Other Ambulatory Visit: Payer: PPO

## 2020-04-20 ENCOUNTER — Telehealth (INDEPENDENT_AMBULATORY_CARE_PROVIDER_SITE_OTHER): Payer: PPO | Admitting: Family Medicine

## 2020-04-20 ENCOUNTER — Encounter: Payer: Self-pay | Admitting: Family Medicine

## 2020-04-20 VITALS — Temp 99.0°F

## 2020-04-20 DIAGNOSIS — J069 Acute upper respiratory infection, unspecified: Secondary | ICD-10-CM | POA: Diagnosis not present

## 2020-04-20 NOTE — Progress Notes (Signed)
Virtual Visit via Telephone Note  I connected with Amy Lin on 04/20/20 at  3:00 PM EDT by telephone and verified that I am speaking with the correct person using two identifiers.   I discussed the limitations, risks, security and privacy concerns of performing an evaluation and management service by telephone and the availability of in person appointments. I also discussed with the patient that there may be a patient responsible charge related to this service. The patient expressed understanding and agreed to proceed.  Location patient: home Location provider: work or home office Participants present for the call: patient, provider Patient did not have a visit in the prior 7 days to address this/these issue(s).   History of Present Illness: Pt is a 87 yo female with pmh sig for seasonal allergies, MI, HTN, CAD, GERD, hypothyroidism, OA, osteoporosis, seasonal allergies, HLD followed by Golden Hurter, NP who was seen for acute concern.  Pt with head congestion, rhinorrhea x several days.  Notes head feels stopped up.  Pt notes temp has been 99 F.  Pt with dry cough, scratchy throat, HA, intermittent ear pressure.  Pt notes rhinorrhea ongoing for at least a week but other symptoms  Pt took Tylenol.  Denies sick contacts.  Pt had a negative home COVID test. Pt has Tessalon pearls at home, but has not taken any.   Observations/Objective: Patient sounds cheerful and well on the phone. I do not appreciate any SOB. Speech and thought processing are grossly intact. Patient reported vitals:  Temp 48F  Assessment and Plan: Viral URI with cough -Reassuring at home COVID test negative -Discussed supportive care including Tylenol, Coricidin HBP, saline nasal rinse -Consider Flonase as needed though in the past caused nasal bleeding -Discussed Tessalon Perles.  Patient has a recent prescription at home that she can use. -Follow-up with PCP for continued or worsened symptoms -Given  precautions  Follow Up Instructions:  F/u prn  I did not refer this patient for an OV in the next 24 hours for this/these issue(s).  I discussed the assessment and treatment plan with the patient. The patient was provided an opportunity to ask questions and all were answered. The patient agreed with the plan and demonstrated an understanding of the instructions.   The patient was advised to call back or seek an in-person evaluation if the symptoms worsen or if the condition fails to improve as anticipated.  I provided 10:30 minutes of non-face-to-face time during this encounter.   Billie Ruddy, MD

## 2020-04-21 ENCOUNTER — Other Ambulatory Visit: Payer: Self-pay

## 2020-04-21 DIAGNOSIS — I213 ST elevation (STEMI) myocardial infarction of unspecified site: Secondary | ICD-10-CM | POA: Diagnosis not present

## 2020-04-21 DIAGNOSIS — Z955 Presence of coronary angioplasty implant and graft: Secondary | ICD-10-CM

## 2020-04-21 MED ORDER — ROSUVASTATIN CALCIUM 20 MG PO TABS: 20.0000 mg | ORAL_TABLET | Freq: Every day | ORAL | 3 refills | Status: DC

## 2020-04-21 NOTE — Progress Notes (Signed)
Daily Session Note  Patient Details  Name: KATARYNA MCQUILKIN MRN: [id number] Date of Birth: 05-01-1933 Referring Provider:   Flowsheet Row Cardiac Rehab from 03/30/2020 in [facility] Cardiac and Pulmonary Rehab  Referring Provider Clayton Lefort MD      Encounter Date: 04/21/2020  Check In:  Session Check In - 04/21/20 1123      Check-In   Supervising physician immediately available to respond to emergencies See telemetry face sheet for immediately available ER MD    Location [facility]-Cardiac & Pulmonary Rehab    Staff Present Birdie Sons, MPA, Elveria Rising, BA, ACSM CEP, Exercise Physiologist;Kara Eliezer Bottom, MS Exercise Physiologist    Virtual Visit No    Medication changes reported     No    Fall or balance concerns reported    No    Warm-up and Cool-down Performed on first and last piece of equipment    Resistance Training Performed Yes    VAD Patient? No    PAD/SET Patient? No      Pain Assessment   Currently in Pain? No/denies              Social History   Tobacco Use  Smoking Status Never Smoker  Smokeless Tobacco Never Used    Goals Met:  Independence with exercise equipment Exercise tolerated well No report of cardiac concerns or symptoms Strength training completed today  Goals Unmet:  Not Applicable  Comments: Pt able to follow exercise prescription today without complaint.  Will continue to monitor for progression.    Dr. [name] Filbert is Medical Director for [place] and [facility].

## 2020-04-22 ENCOUNTER — Encounter: Payer: Self-pay | Admitting: *Deleted

## 2020-04-22 DIAGNOSIS — I213 ST elevation (STEMI) myocardial infarction of unspecified site: Secondary | ICD-10-CM

## 2020-04-22 DIAGNOSIS — Z955 Presence of coronary angioplasty implant and graft: Secondary | ICD-10-CM

## 2020-04-22 NOTE — Progress Notes (Signed)
Cardiac Individual Treatment Plan  Patient Details  Name: Amy Lin MRN: [id number] Date of Birth: 1933/07/16 Referring Provider:   Flowsheet Row Cardiac Rehab from 03/30/2020 in [facility] Cardiac and Pulmonary Rehab  Referring Provider Clayton Lefort MD      Initial Encounter Date:  Flowsheet Row Cardiac Rehab from 03/30/2020 in [facility] Cardiac and Pulmonary Rehab  Date 03/30/20      Visit Diagnosis: ST elevation myocardial infarction (STEMI), unspecified artery [facility])  Status post coronary artery stent placement  Patient's Home Medications on Admission:  Current Outpatient Medications:  .  aspirin 81 MG EC tablet, Take 81 mg by mouth daily., Disp: , Rfl:  .  cetirizine (ZYRTEC) 10 MG tablet, Take 10 mg by mouth daily., Disp: , Rfl:  .  Cholecalciferol (VITAMIN D3) 2000 UNITS TABS, Take 1 tablet by mouth every evening., Disp: , Rfl:  .  Cyanocobalamin (VITAMIN B 12 PO), Take 1 tablet by mouth every evening., Disp: , Rfl:  .  ezetimibe (ZETIA) 10 MG tablet, Take 1 tablet (10 mg total) by mouth daily after supper., Disp: 30 tablet, Rfl: 2 .  hydrocortisone 2.5 % lotion, Apply topically 2 (two) times daily., Disp: 59 mL, Rfl: 0 .  levothyroxine (SYNTHROID) 75 MCG tablet, TAKE 1 TABLET BY MOUTH ONCE A DAY ON AN EMPTY STOMACH.TAKE WITH A GLASS OF WATERAT LEAST 30-60 MIN BEFORE BREAKFAST (Patient taking differently: Take 75 mcg by mouth daily before breakfast.), Disp: 90 tablet, Rfl: 1 .  lisinopril (ZESTRIL) 20 MG tablet, Take 1 tablet (20 mg total) by mouth daily., Disp: 90 tablet, Rfl: 3 .  metoprolol tartrate (LOPRESSOR) 25 MG tablet, Take 1 tablet (25 mg total) by mouth 2 (two) times daily., Disp: 180 tablet, Rfl: 3 .  nitroGLYCERIN (NITROSTAT) 0.4 MG SL tablet, Place 1 tablet (0.4 mg total) under the tongue every 5 (five) minutes as needed for up to 25 days for chest pain., Disp: 25 tablet, Rfl: 3 .  pantoprazole (PROTONIX) 20 MG tablet, TAKE 1 TABLET BY MOUTH TWICE A DAY (Patient taking  differently: Take 20 mg by mouth daily.), Disp: 180 tablet, Rfl: 2 .  rosuvastatin (CRESTOR) 20 MG tablet, Take 1 tablet (20 mg total) by mouth daily., Disp: 90 tablet, Rfl: 3 .  ticagrelor (BRILINTA) 90 MG TABS tablet, Take 1 tablet (90 mg total) by mouth 2 (two) times daily., Disp: 180 tablet, Rfl: 3  Past Medical History: Past Medical History:  Diagnosis Date  . Allergy   . Coronary artery disease   . GERD (gastroesophageal reflux disease)   . Hyperlipidemia   . Hypertension    diuretic for edema at first  . Hypothyroidism   . Myocardial infarction ([place])   . Osteoarthritis, multiple sites   . Osteoporosis     Tobacco Use: Social History   Tobacco Use  Smoking Status Never Smoker  Smokeless Tobacco Never Used    Labs: Recent Review Flowsheet Data    Labs for ITP Cardiac and Pulmonary Rehab Latest Ref Rng & Units 07/20/2017 09/13/2018 07/19/2019 09/25/2019 03/05/2020   Cholestrol 0 - 200 mg/dL 251(H) 304(H) 300(H) 194 194   LDLCALC 0 - 99 mg/dL 178(H) 237(H) 207(H) 119(H) 114(H)   LDLDIRECT mg/dL - - - - -   HDL >40 mg/dL 50.80 50.60 47(L) 53.40 51   Trlycerides <150 mg/dL 110.0 84.0 260(H) 107.0 146   Hemoglobin A1c 4.6 - 6.5 % - - - 6.3 -   TCO2 22 - 32 mmol/L - - - - 22  Exercise Target Goals: Exercise Program Goal: Individual exercise prescription set using results from initial 6 min walk test and THRR while considering  patient's activity barriers and safety.   Exercise Prescription Goal: Initial exercise prescription builds to 30-45 minutes a day of aerobic activity, 2-3 days per week.  Home exercise guidelines will be given to patient during program as part of exercise prescription that the participant will acknowledge.   Education: Aerobic Exercise: - Group verbal and visual presentation on the components of exercise prescription. Introduces F.I.T.T principle from ACSM for exercise prescriptions.  Reviews F.I.T.T. principles of aerobic exercise including  progression. Written material given at graduation.   Education: Resistance Exercise: - Group verbal and visual presentation on the components of exercise prescription. Introduces F.I.T.T principle from ACSM for exercise prescriptions  Reviews F.I.T.T. principles of resistance exercise including progression. Written material given at graduation.    Education: Exercise & Equipment Safety: - Individual verbal instruction and demonstration of equipment use and safety with use of the equipment. Flowsheet Row Cardiac Rehab from 04/16/2020 in [facility] Cardiac and Pulmonary Rehab  Date 03/30/20  Educator [facility]  Instruction Review Code 1- Verbalizes Understanding      Education: Exercise Physiology & General Exercise Guidelines: - Group verbal and written instruction with models to review the exercise physiology of the cardiovascular system and associated critical values. Provides general exercise guidelines with specific guidelines to those with heart or lung disease.    Education: Flexibility, Balance, Mind/Body Relaxation: - Group verbal and visual presentation with interactive activity on the components of exercise prescription. Introduces F.I.T.T principle from ACSM for exercise prescriptions. Reviews F.I.T.T. principles of flexibility and balance exercise training including progression. Also discusses the mind body connection.  Reviews various relaxation techniques to help reduce and manage stress (i.e. Deep breathing, progressive muscle relaxation, and visualization). Balance handout provided to take home. Written material given at graduation. Flowsheet Row Cardiac Rehab from 04/16/2020 in [facility] Cardiac and Pulmonary Rehab  Date 04/02/20  Educator [facility]  Instruction Review Code 1- Verbalizes Understanding      Activity Barriers & Risk Stratification:  Activity Barriers & Cardiac Risk Stratification - 03/30/20 1150      Activity Barriers & Cardiac Risk Stratification   Activity Barriers  Arthritis;Muscular Weakness;Deconditioning;Shortness of Breath;Balance Concerns   arthritis all over, big toes tend to have painful flare ups   Cardiac Risk Stratification Moderate           6 Minute Walk:  6 Minute Walk    Row Name 03/30/20 1149         6 Minute Walk   Distance 1100 feet     Walk Time 6 minutes     # of Rest Breaks 0     MPH 2.08     METS 1.7     RPE 11     Perceived Dyspnea  2     VO2 Peak 5.94     Symptoms Yes (comment)     Comments fatigue at end, SOB     Resting HR 51 bpm     Resting BP 118/66     Resting Oxygen Saturation  97 %     Exercise Oxygen Saturation  during 6 min walk 96 %     Max Ex. HR 88 bpm     Max Ex. BP 134/72     2 Minute Post BP 124/72            Oxygen Initial Assessment:   Oxygen Re-Evaluation:   Oxygen  Discharge (Final Oxygen Re-Evaluation):   Initial Exercise Prescription:  Initial Exercise Prescription - 03/30/20 1100      Date of Initial Exercise RX and Referring Provider   Date 03/30/20    Referring Provider Clayton Lefort MD      Treadmill   MPH 1.8    Grade 0.5    Minutes 15    METs 2.5      Recumbant Elliptical   Level 1    RPM 50    Minutes 15    METs 2      REL-XR   Level 1    Speed 50    Minutes 15    METs 2      T5 Nustep   Level 2    SPM 80    Minutes 15    METs 2      Prescription Details   Frequency (times per week) 2    Duration Progress to 30 minutes of continuous aerobic without signs/symptoms of physical distress      Intensity   THRR 40-80% of Max Heartrate 85-119    Ratings of Perceived Exertion 11-13    Perceived Dyspnea 0-4      Progression   Progression Continue to progress workloads to maintain intensity without signs/symptoms of physical distress.      Resistance Training   Training Prescription Yes    Weight 3 lb    Reps 10-15           Perform Capillary Blood Glucose checks as needed.  Exercise Prescription Changes:  Exercise Prescription Changes     Row Name 03/30/20 1100 04/06/20 1500 04/20/20 1000         Response to Exercise   Blood Pressure (Admit) 118/66 118/68 144/82     Blood Pressure (Exercise) 134/72 128/62 140/66     Blood Pressure (Exit) 124/72 114/58 126/64     Heart Rate (Admit) 51 bpm 57 bpm 59 bpm     Heart Rate (Exercise) 88 bpm 84 bpm 89 bpm     Heart Rate (Exit) 61 bpm 65 bpm 62 bpm     Oxygen Saturation (Admit) 97 % -- --     Oxygen Saturation (Exercise) 96 % -- --     Rating of Perceived Exertion (Exercise) 11 13 13      Perceived Dyspnea (Exercise) 2 -- --     Symptoms fatigue, SOB none none     Comments walk test results second full day of exercise --     Duration -- Progress to 30 minutes of  aerobic without signs/symptoms of physical distress Continue with 30 min of aerobic exercise without signs/symptoms of physical distress.     Intensity -- THRR unchanged THRR unchanged           Progression   Progression -- Continue to progress workloads to maintain intensity without signs/symptoms of physical distress. Continue to progress workloads to maintain intensity without signs/symptoms of physical distress.     Average METs -- 2.29 2.25           Resistance Training   Training Prescription -- Yes Yes     Weight -- 3 lb 3 lb     Reps -- 10-15 10-15           Interval Training   Interval Training -- No No           Treadmill   MPH -- 2.5 1.8     Grade -- 0.5 0.5  Minutes -- 15 15     METs -- 3.09 2.5           Recumbant Elliptical   Level -- 1 1     Minutes -- 15 15     METs -- 1.5 1.4            Exercise Comments:  Exercise Comments    Row Name 03/31/20 1114           Exercise Comments First full day of exercise!  Patient was oriented to gym and equipment including functions, settings, policies, and procedures.  Patient's individual exercise prescription and treatment plan were reviewed.  All starting workloads were established based on the results of the 6 minute walk test done at  initial orientation visit.  The plan for exercise progression was also introduced and progression will be customized based on patient's performance and goals.              Exercise Goals and Review:  Exercise Goals    Row Name 03/30/20 1157             Exercise Goals   Increase Physical Activity Yes       Intervention Provide advice, education, support and counseling about physical activity/exercise needs.;Develop an individualized exercise prescription for aerobic and resistive training based on initial evaluation findings, risk stratification, comorbidities and participant's personal goals.       Expected Outcomes Short Term: Attend rehab on a regular basis to increase amount of physical activity.;Long Term: Add in home exercise to make exercise part of routine and to increase amount of physical activity.;Long Term: Exercising regularly at least 3-5 days a week.       Increase Strength and Stamina Yes       Intervention Provide advice, education, support and counseling about physical activity/exercise needs.;Develop an individualized exercise prescription for aerobic and resistive training based on initial evaluation findings, risk stratification, comorbidities and participant's personal goals.       Expected Outcomes Short Term: Increase workloads from initial exercise prescription for resistance, speed, and METs.;Short Term: Perform resistance training exercises routinely during rehab and add in resistance training at home;Long Term: Improve cardiorespiratory fitness, muscular endurance and strength as measured by increased METs and functional capacity (6MWT)       Able to understand and use rate of perceived exertion (RPE) scale Yes       Intervention Provide education and explanation on how to use RPE scale       Expected Outcomes Short Term: Able to use RPE daily in rehab to express subjective intensity level;Long Term:  Able to use RPE to guide intensity level when exercising  independently       Able to understand and use Dyspnea scale Yes       Intervention Provide education and explanation on how to use Dyspnea scale       Expected Outcomes Short Term: Able to use Dyspnea scale daily in rehab to express subjective sense of shortness of breath during exertion;Long Term: Able to use Dyspnea scale to guide intensity level when exercising independently       Knowledge and understanding of Target Heart Rate Range (THRR) Yes       Intervention Provide education and explanation of THRR including how the numbers were predicted and where they are located for reference       Expected Outcomes Short Term: Able to state/look up THRR;Short Term: Able to use daily as guideline for intensity in rehab;Long Term:  Able to use THRR to govern intensity when exercising independently       Able to check pulse independently Yes       Intervention Provide education and demonstration on how to check pulse in carotid and radial arteries.;Review the importance of being able to check your own pulse for safety during independent exercise       Expected Outcomes Short Term: Able to explain why pulse checking is important during independent exercise;Long Term: Able to check pulse independently and accurately       Understanding of Exercise Prescription Yes       Intervention Provide education, explanation, and written materials on patient's individual exercise prescription       Expected Outcomes Short Term: Able to explain program exercise prescription;Long Term: Able to explain home exercise prescription to exercise independently              Exercise Goals Re-Evaluation :  Exercise Goals Re-Evaluation    Row Name 03/31/20 1114 04/06/20 1516 04/20/20 1005         Exercise Goal Re-Evaluation   Exercise Goals Review Able to understand and use rate of perceived exertion (RPE) scale;Able to understand and use Dyspnea scale;Knowledge and understanding of Target Heart Rate Range  (THRR);Understanding of Exercise Prescription Increase Physical Activity;Increase Strength and Stamina;Understanding of Exercise Prescription Increase Physical Activity;Increase Strength and Stamina     Comments Reviewed RPE and dyspnea scales, THR and program prescription with pt today.  Pt voiced understanding and was given a copy of goals to take home. Reisa is off to a good start in rehab.  She has completed her first two full days of exercise.  We will continue to monitor her progress. Anastashia is doing well with exercise.  She is at 1.8 mph and .5% grade.  Staff will encourage her to increase levels on machines.     Expected Outcomes Short: Use RPE daily to regulate intensity. Long: Follow program prescription in THR. Short: Continue to attend rehab regularly Long: Continue to follow program prescription Short: try level 2 on XR and more grade on TM  Long: build stamina            Discharge Exercise Prescription (Final Exercise Prescription Changes):  Exercise Prescription Changes - 04/20/20 1000      Response to Exercise   Blood Pressure (Admit) 144/82    Blood Pressure (Exercise) 140/66    Blood Pressure (Exit) 126/64    Heart Rate (Admit) 59 bpm    Heart Rate (Exercise) 89 bpm    Heart Rate (Exit) 62 bpm    Rating of Perceived Exertion (Exercise) 13    Symptoms none    Duration Continue with 30 min of aerobic exercise without signs/symptoms of physical distress.    Intensity THRR unchanged      Progression   Progression Continue to progress workloads to maintain intensity without signs/symptoms of physical distress.    Average METs 2.25      Resistance Training   Training Prescription Yes    Weight 3 lb    Reps 10-15      Interval Training   Interval Training No      Treadmill   MPH 1.8    Grade 0.5    Minutes 15    METs 2.5      Recumbant Elliptical   Level 1    Minutes 15    METs 1.4           Nutrition:  Target Goals: Understanding  of nutrition guidelines,  daily intake of sodium 1500mg , cholesterol 200mg , calories 30% from fat and 7% or less from saturated fats, daily to have 5 or more servings of fruits and vegetables.  Education: All About Nutrition: -Group instruction provided by verbal, written material, interactive activities, discussions, models, and posters to present general guidelines for heart healthy nutrition including fat, fiber, MyPlate, the role of sodium in heart healthy nutrition, utilization of the nutrition label, and utilization of this knowledge for meal planning. Follow up email sent as well. Written material given at graduation. Flowsheet Row Cardiac Rehab from 04/16/2020 in [facility] Cardiac and Pulmonary Rehab  Date 04/09/20  Educator [facility]  Instruction Review Code 1- Verbalizes Understanding      Biometrics:  Pre Biometrics - 03/30/20 1158      Pre Biometrics   Height 5' 4.2" (1.631 m)    Weight 154 lb 3.2 oz (69.9 kg)    BMI (Calculated) 26.29    Single Leg Stand 3.8 seconds            Nutrition Therapy Plan and Nutrition Goals:  Nutrition Therapy & Goals - 03/30/20 1132      Nutrition Therapy   Diet Heart healthy, low Na    Drug/Food Interactions Statins/Certain Fruits    Protein (specify units) 55g    Fiber 25 grams    Whole Grain Foods 3 servings    Saturated Fats 12 max. grams    Fruits and Vegetables 8 servings/day    Sodium 1.5 grams      Personal Nutrition Goals   Nutrition Goal ST: switch to whole grain bread, include one extra serving of vegetables with lunch LT: eat at least 5 servings of fruits/vegetables per day, make at least half of grain eaten whole grains.    Comments B: scrambled egg (butter), toast (white with jelly and butter), cantaloupe - 2 cups coffee (cream and sugar) L: fruit, sandwich, cheese toast (light lunch) D: vegetables, sweet potato - once per week, hamburger (1x/week), porkloin, chicken. Drinks: water. Discussed heart halthy eating and small changes.      Intervention Plan    Intervention Prescribe, educate and counsel regarding individualized specific dietary modifications aiming towards targeted core components such as weight, hypertension, lipid management, diabetes, heart failure and other comorbidities.;Nutrition handout(s) given to patient.    Expected Outcomes Short Term Goal: Understand basic principles of dietary content, such as calories, fat, sodium, cholesterol and nutrients.;Short Term Goal: A plan has been developed with personal nutrition goals set during dietitian appointment.;Long Term Goal: Adherence to prescribed nutrition plan.           Nutrition Assessments:  MEDIFICTS Score Key:  ?70 Need to make dietary changes   40-70 Heart Healthy Diet  ? 40 Therapeutic Level Cholesterol Diet  Flowsheet Row Cardiac Rehab from 03/30/2020 in [facility] Cardiac and Pulmonary Rehab  Picture Your Plate Total Score on Admission 66     Picture Your Plate Scores:  <81 Unhealthy dietary pattern with much room for improvement.  41-50 Dietary pattern unlikely to meet recommendations for good health and room for improvement.  51-60 More healthful dietary pattern, with some room for improvement.   >60 Healthy dietary pattern, although there may be some specific behaviors that could be improved.    Nutrition Goals Re-Evaluation:   Nutrition Goals Discharge (Final Nutrition Goals Re-Evaluation):   Psychosocial: Target Goals: Acknowledge presence or absence of significant depression and/or stress, maximize coping skills, provide positive support system. Participant is able to verbalize types  and ability to use techniques and skills needed for reducing stress and depression.   Education: Stress, Anxiety, and Depression - Group verbal and visual presentation to define topics covered.  Reviews how body is impacted by stress, anxiety, and depression.  Also discusses healthy ways to reduce stress and to treat/manage anxiety and depression.  Written material  given at graduation.   Education: Sleep Hygiene -Provides group verbal and written instruction about how sleep can affect your health.  Define sleep hygiene, discuss sleep cycles and impact of sleep habits. Review good sleep hygiene tips.    Initial Review & Psychosocial Screening:  Initial Psych Review & Screening - 03/20/20 1307      Initial Review   Current issues with Current Stress Concerns    Source of Stress Concerns Unable to participate in former interests or hobbies;Unable to perform yard/household activities      [place]? Yes   daughter     Barriers   Psychosocial barriers to participate in program There are no identifiable barriers or psychosocial needs.      Screening Interventions   Interventions Encouraged to exercise;Provide feedback about the scores to participant;To provide support and resources with identified psychosocial needs    Expected Outcomes Long Term Goal: Stressors or current issues are controlled or eliminated.;Short Term goal: Utilizing psychosocial counselor, staff and physician to assist with identification of specific Stressors or current issues interfering with healing process. Setting desired goal for each stressor or current issue identified.;Short Term goal: Identification and review with participant of any Quality of Life or Depression concerns found by scoring the questionnaire.;Long Term goal: The participant improves quality of Life and PHQ9 Scores as seen by post scores and/or verbalization of changes           Quality of Life Scores:   Quality of Life - 03/30/20 1216      Quality of Life   Select Quality of Life      Quality of Life Scores   Health/Function Pre 19 %    Socioeconomic Pre 24.86 %    Psych/Spiritual Pre 23.93 %    Family Pre 25.5 %    GLOBAL Pre 22.08 %          Scores of 19 and below usually indicate a poorer quality of life in these areas.  A difference of  2-3 points is a clinically  meaningful difference.  A difference of 2-3 points in the total score of the Quality of Life Index has been associated with significant improvement in overall quality of life, self-image, physical symptoms, and general health in studies assessing change in quality of life.  PHQ-9: Recent Review Flowsheet Data    Depression screen [facility] 2/9 03/30/2020 09/25/2019 09/13/2018 07/16/2018 07/20/2017   Decreased Interest 1 0 0 0 0   Down, Depressed, Hopeless 1 0 0 0 0   PHQ - 2 Score 2 0 0 0 0   Altered sleeping 0 - 0 - -   Tired, decreased energy 2 - 0 - -   Change in appetite 1 - 0 - -   Feeling bad or failure about yourself  1 - 0 - -   Trouble concentrating 0 - 0 - -   Moving slowly or fidgety/restless 1 - 0 - -   Suicidal thoughts 0 - 0 - -   PHQ-9 Score 7 - 0 - -   Difficult doing work/chores Somewhat difficult - Not difficult at all - -  Interpretation of Total Score  Total Score Depression Severity:  1-4 = Minimal depression, 5-9 = Mild depression, 10-14 = Moderate depression, 15-19 = Moderately severe depression, 20-27 = Severe depression   Psychosocial Evaluation and Intervention:  Psychosocial Evaluation - 03/20/20 1331      Psychosocial Evaluation & Interventions   Interventions Encouraged to exercise with the program and follow exercise prescription    Comments Ms. Satcher states she is slowly feeling better after her MI. She still feels very tired at times and think it may be her body adjusting to the medications. She has started walking some around the house and is looking forward to becoming more active in Cardiac Rehab. Her daughter is very supportive and wants to be involved in her heart healthy lifestyle. Ms. Rodden doesn't report any new stress and her current stress level is "nothing she can't handle." She hopes to boost her stamina and remain independent as long as possible.    Expected Outcomes Short: attend cardiac rehab for education and exercise. Long: develop positive self  care habits.    Continue Psychosocial Services  Follow up required by staff           Psychosocial Re-Evaluation:   Psychosocial Discharge (Final Psychosocial Re-Evaluation):   Vocational Rehabilitation: Provide vocational rehab assistance to qualifying candidates.   Vocational Rehab Evaluation & Intervention:  Vocational Rehab - 03/20/20 1307      Initial Vocational Rehab Evaluation & Intervention   Assessment shows need for Vocational Rehabilitation No           Education: Education Goals: Education classes will be provided on a variety of topics geared toward better understanding of heart health and risk factor modification. Participant will state understanding/return demonstration of topics presented as noted by education test scores.  Learning Barriers/Preferences:  Learning Barriers/Preferences - 03/20/20 1307      Learning Barriers/Preferences   Learning Barriers None    Learning Preferences None           General Cardiac Education Topics:  AED/CPR: - Group verbal and written instruction with the use of models to demonstrate the basic use of the AED with the basic ABC's of resuscitation.   Anatomy and Cardiac Procedures: - Group verbal and visual presentation and models provide information about basic cardiac anatomy and function. Reviews the testing methods done to diagnose heart disease and the outcomes of the test results. Describes the treatment choices: Medical Management, Angioplasty, or Coronary Bypass Surgery for treating various heart conditions including Myocardial Infarction, Angina, Valve Disease, and Cardiac Arrhythmias.  Written material given at graduation.   Medication Safety: - Group verbal and visual instruction to review commonly prescribed medications for heart and lung disease. Reviews the medication, class of the drug, and side effects. Includes the steps to properly store meds and maintain the prescription regimen.  Written material  given at graduation. Flowsheet Row Cardiac Rehab from 04/16/2020 in [facility] Cardiac and Pulmonary Rehab  Date 04/16/20  Educator SB  Instruction Review Code 1- Verbalizes Understanding      Intimacy: - Group verbal instruction through game format to discuss how heart and lung disease can affect sexual intimacy. Written material given at graduation..   Know Your Numbers and Heart Failure: - Group verbal and visual instruction to discuss disease risk factors for cardiac and pulmonary disease and treatment options.  Reviews associated critical values for Overweight/Obesity, Hypertension, Cholesterol, and Diabetes.  Discusses basics of heart failure: signs/symptoms and treatments.  Introduces Heart Failure Zone chart for action  plan for heart failure.  Written material given at graduation.   Infection Prevention: - Provides verbal and written material to individual with discussion of infection control including proper hand washing and proper equipment cleaning during exercise session. Flowsheet Row Cardiac Rehab from 04/16/2020 in [facility] Cardiac and Pulmonary Rehab  Date 03/30/20  Educator [facility]  Instruction Review Code 1- Verbalizes Understanding      Falls Prevention: - Provides verbal and written material to individual with discussion of falls prevention and safety. Flowsheet Row Cardiac Rehab from 04/16/2020 in [facility] Cardiac and Pulmonary Rehab  Date 03/30/20  Educator [facility]  Instruction Review Code 1- Verbalizes Understanding      Other: -Provides group and verbal instruction on various topics (see comments)   Knowledge Questionnaire Score:  Knowledge Questionnaire Score - 03/30/20 1217      Knowledge Questionnaire Score   Pre Score 26/26           Core Components/Risk Factors/Patient Goals at Admission:  Personal Goals and Risk Factors at Admission - 03/30/20 1219      Core Components/Risk Factors/Patient Goals on Admission    Weight Management Yes;Weight Loss    Intervention  Weight Management: Develop a combined nutrition and exercise program designed to reach desired caloric intake, while maintaining appropriate intake of nutrient and fiber, sodium and fats, and appropriate energy expenditure required for the weight goal.;Weight Management: Provide education and appropriate resources to help participant work on and attain dietary goals.;Weight Management/Obesity: Establish reasonable short term and long term weight goals.;Obesity: Provide education and appropriate resources to help participant work on and attain dietary goals.    Admit Weight 154 lb 3.2 oz (69.9 kg)    Goal Weight: Short Term 150 lb (68 kg)    Goal Weight: Long Term 150 lb (68 kg)    Expected Outcomes Short Term: Continue to assess and modify interventions until short term weight is achieved;Long Term: Adherence to nutrition and physical activity/exercise program aimed toward attainment of established weight goal;Weight Loss: Understanding of general recommendations for a balanced deficit meal plan, which promotes 1-2 lb weight loss per week and includes a negative energy balance of [phone] kcal/d;Understanding recommendations for meals to include 15-35% energy as protein, 25-35% energy from fat, 35-60% energy from carbohydrates, less than 200mg  of dietary cholesterol, 20-35 gm of total fiber daily;Understanding of distribution of calorie intake throughout the day with the consumption of 4-5 meals/snacks    Hypertension Yes    Intervention Provide education on lifestyle modifcations including regular physical activity/exercise, weight management, moderate sodium restriction and increased consumption of fresh fruit, vegetables, and low fat dairy, alcohol moderation, and smoking cessation.;Monitor prescription use compliance.    Expected Outcomes Short Term: Continued assessment and intervention until BP is < 140/52mm HG in hypertensive participants. < 130/4mm HG in hypertensive participants with diabetes, heart  failure or chronic kidney disease.;Long Term: Maintenance of blood pressure at goal levels.    Lipids Yes    Intervention Provide education and support for participant on nutrition & aerobic/resistive exercise along with prescribed medications to achieve LDL 70mg , HDL >40mg .    Expected Outcomes Short Term: Participant states understanding of desired cholesterol values and is compliant with medications prescribed. Participant is following exercise prescription and nutrition guidelines.;Long Term: Cholesterol controlled with medications as prescribed, with individualized exercise RX and with personalized nutrition plan. Value goals: LDL < 70mg , HDL > 40 mg.           Education:Diabetes - Individual verbal and written instruction to review  signs/symptoms of diabetes, desired ranges of glucose level fasting, after meals and with exercise. Acknowledge that pre and post exercise glucose checks will be done for 3 sessions at entry of program.   Core Components/Risk Factors/Patient Goals Review:    Core Components/Risk Factors/Patient Goals at Discharge (Final Review):    ITP Comments:  ITP Comments    Row Name 03/20/20 1322 03/30/20 1148 03/31/20 1114 04/22/20 0957     ITP Comments Initial telephone orientation completed. Diagnosis can be found in CHL 2/3. EP orientation scheduled for 2/28 at 10am. Completed 6MWT and gym orientation. Initial ITP created and sent for review to Dr. [name] Filbert, Medical Director. First full day of exercise!  Patient was oriented to gym and equipment including functions, settings, policies, and procedures.  Patient's individual exercise prescription and treatment plan were reviewed.  All starting workloads were established based on the results of the 6 minute walk test done at initial orientation visit.  The plan for exercise progression was also introduced and progression will be customized based on patient's performance and goals. 30 Day review completed. Medical  Director ITP review done, changes made as directed, and signed approval by Medical Director.  New to program           Comments:

## 2020-04-23 ENCOUNTER — Other Ambulatory Visit: Payer: Self-pay

## 2020-04-23 DIAGNOSIS — Z955 Presence of coronary angioplasty implant and graft: Secondary | ICD-10-CM

## 2020-04-23 DIAGNOSIS — I213 ST elevation (STEMI) myocardial infarction of unspecified site: Secondary | ICD-10-CM

## 2020-04-23 DIAGNOSIS — H00014 Hordeolum externum left upper eyelid: Secondary | ICD-10-CM | POA: Diagnosis not present

## 2020-04-23 NOTE — Progress Notes (Signed)
Primary Physician/Referring:  [name], NP  Patient ID: Amy Lin, female    DOB: 17-May-1933, 87 y.o.   MRN: [id number]  Chief Complaint  Patient presents with  . Coronary Artery Disease  . Follow-up    6 week  . Hypertension  . Hyperlipidemia   HPI:    Amy Lin  is a 87 y.o. Caucasian female with history of hypertension, hyperlipidemia, GERD who presented to [facility] emergency department 03/05/2020 with active chest pain and EKG revealing inferior STEMI. Recommend patient continue DAPT until at least 03/07/2021 if tolerated.   Patient was taken emergently for cardiac catheterization and underwent successful angioplasty and stenting with 4.0 x 30 mm resolute Onyx to the dominant proximal RCA.  She had residual high-grade and ulcerated stenosis in the OM1 branch of circumflex which was felt to be unstable and underwent staged intervention to the vessel, with placement of 2.5 x 15 mm resolute Onyx DES.  She is presently on guideline directed medical therapy including aspirin, lisinopril, metoprolol titrate, rosuvastatin, Brilinta, as well as Zetia and as needed nitroglycerin.   Patient presents for 6-week follow-up of coronary artery disease, hypertension, and hyperlipidemia.  No changes were made to her medications at last visit, however discussed diet and lifestyle modifications extensively.  Patient is currently enrolled in cardiac rehab, and progressing well.  Her only complaint today is that she has had increased frequency of bowel movements which have been softer than typical for her since discharge from the hospital.  She continues to have mild dyspnea on exertion, which is improving slowly.  Denies chest pain, palpitations, syncope, near syncope, leg swelling, orthopnea, PND.   Past Medical History:  Diagnosis Date  . Allergy   . Coronary artery disease   . GERD (gastroesophageal reflux disease)   . Hyperlipidemia   . Hypertension    diuretic for edema at first  .  Hypothyroidism   . Myocardial infarction ([place])   . Osteoarthritis, multiple sites   . Osteoporosis    Past Surgical History:  Procedure Laterality Date  . ABDOMINAL HYSTERECTOMY    . BACK SURGERY  1999   Discectomy and Ray cage  . BLADDER REPAIR  ~2010  . CARDIAC CATHETERIZATION  03/05/2020  . CARPAL TUNNEL RELEASE  8/15  . COLONOSCOPY W/ BIOPSIES N/A 2016  . CORONARY STENT INTERVENTION N/A 03/06/2020   Procedure: CORONARY STENT INTERVENTION;  Surgeon: Adrian Prows, MD;  Location: [place] [facility];  Service: Cardiovascular;  Laterality: N/A;  . CORONARY THROMBECTOMY N/A 03/05/2020   Procedure: Coronary Thrombectomy;  Surgeon: Adrian Prows, MD;  Location: [place] [facility];  Service: Cardiovascular;  Laterality: N/A;  . CORONARY/GRAFT ACUTE MI REVASCULARIZATION N/A 03/05/2020   Procedure: Coronary/Graft Acute MI Revascularization;  Surgeon: Adrian Prows, MD;  Location: [place] [facility];  Service: Cardiovascular;  Laterality: N/A;  . INTRAVASCULAR ULTRASOUND/IVUS N/A 03/06/2020   Procedure: Intravascular Ultrasound/IVUS;  Surgeon: Adrian Prows, MD;  Location: [place] [facility];  Service: Cardiovascular;  Laterality: N/A;  . LEFT HEART CATH AND CORONARY ANGIOGRAPHY N/A 03/05/2020   Procedure: LEFT HEART CATH AND CORONARY ANGIOGRAPHY;  Surgeon: Adrian Prows, MD;  Location: [place] [facility];  Service: Cardiovascular;  Laterality: N/A;  . MENISCECTOMY Left 2/15   Dr Veverly Fells  . REFRACTIVE SURGERY Bilateral 07/2019  . UPPER GI ENDOSCOPY N/A Feb 2017   Family History  Problem Relation Age of Onset  . Heart disease Mother   . Stroke Father   . Hypertension  Father   . Heart disease Father   . Diabetes Father     Social History   Tobacco Use  . Smoking status: Never Smoker  . Smokeless tobacco: Never Used  Substance Use Topics  . Alcohol use: No   Marital Status: Divorced   ROS  Review of Systems  Constitutional: Negative for malaise/fatigue and weight gain.  Cardiovascular: Positive for  dyspnea on exertion (improving). Negative for chest pain, claudication, leg swelling, near-syncope, orthopnea, palpitations, paroxysmal nocturnal dyspnea and syncope.  Hematologic/Lymphatic: Does not bruise/bleed easily.  Gastrointestinal: Negative for melena.  Neurological: Negative for dizziness and weakness.    Objective  Blood pressure (!) 142/58, pulse (!) 59, temperature (!) 95.5 F (35.3 C), temperature source Temporal, resp. rate 16, height 5\' 4"  (1.626 m), weight 153 lb (69.4 kg), SpO2 96 %.  Vitals with BMI 04/24/2020 03/30/2020 03/13/2020  Height 5\' 4"  5' 4.2" 5' 3.4"  Weight 153 lbs 154 lbs 3 oz 154 lbs 10 oz  BMI 26.25 87.86 76.72  Systolic 094 - 709  Diastolic 58 - 67  Pulse 59 - 53     Physical Exam Vitals reviewed.  Constitutional:      Appearance: Normal appearance.  HENT:     Head: Normocephalic and atraumatic.  Cardiovascular:     Rate and Rhythm: Normal rate and regular rhythm.     Pulses: Intact distal pulses.          Carotid pulses are 2+ on the right side and 2+ on the left side.      Radial pulses are 2+ on the right side and 2+ on the left side.       Femoral pulses are 2+ on the right side and 2+ on the left side.      Popliteal pulses are 2+ on the right side and 2+ on the left side.       Dorsalis pedis pulses are 0 on the right side and 0 on the left side.       Posterior tibial pulses are 0 on the left side.     Heart sounds: S1 normal and S2 normal. Murmur heard.   Midsystolic murmur is present with a grade of 2/6 at the upper right sternal border. No gallop.   Pulmonary:     Effort: Pulmonary effort is normal. No respiratory distress.     Breath sounds: No wheezing, rhonchi or rales.  Musculoskeletal:     Right lower leg: Edema (trace, ankle) present.     Left lower leg: Edema (trace, ankle) present.  Skin:    General: Skin is warm and dry.     Capillary Refill: Capillary refill takes less than 2 seconds.  Neurological:     General: No focal  deficit present.     Mental Status: She is alert and oriented to person, place, and time.  Psychiatric:        Mood and Affect: Mood normal.        Behavior: Behavior normal.     Laboratory examination:   Recent Labs    03/05/20 1013 03/06/20 0055 03/07/20 0215  NA 139 137 140  K 3.7 3.8 3.8  CL 106 107 107  CO2 21* 20* 22  GLUCOSE 129* 108* 110*  BUN 14 13 18   CREATININE 0.80 0.90 1.14*  CALCIUM 9.4 8.5* 8.9  GFRNONAA >60 >60 47*   CrCl cannot be calculated (Patient's most recent lab result is older than the maximum 21 days allowed.).  CMP Latest  Ref Rng & Units 03/07/2020 03/06/2020 03/05/2020  Glucose 70 - 99 mg/dL 110(H) 108(H) 129(H)  BUN 8 - 23 mg/dL 18 13 14   Creatinine 0.44 - 1.00 mg/dL 1.14(H) 0.90 0.80  Sodium 135 - 145 mmol/L 140 137 139  Potassium 3.5 - 5.1 mmol/L 3.8 3.8 3.7  Chloride 98 - 111 mmol/L 107 107 106  CO2 22 - 32 mmol/L 22 20(L) 21(L)  Calcium 8.9 - 10.3 mg/dL 8.9 8.5(L) 9.4  Total Protein 6.5 - 8.1 g/dL - - 6.8  Total Bilirubin 0.3 - 1.2 mg/dL - - 0.7  Alkaline Phos 38 - 126 U/L - - 51  AST 15 - 41 U/L - - 22  ALT 0 - 44 U/L - - 16   CBC Latest Ref Rng & Units 03/07/2020 03/06/2020 03/05/2020  WBC 4.0 - 10.5 K/uL 7.9 8.2 9.6  Hemoglobin 12.0 - 15.0 g/dL 11.7(L) 10.8(L) 12.8  Hematocrit 36.0 - 46.0 % 35.6(L) 32.8(L) 36.1  Platelets 150 - 400 K/uL 151 144(L) 169    Lipid Panel Recent Labs    07/19/19 1613 09/25/19 1458 03/05/20 1013  CHOL 300* 194 194  TRIG 260* 107.0 146  LDLCALC 207* 119* 114*  VLDL  --  21.4 29  HDL 47* 53.40 51  CHOLHDL 6.4* 4 3.8    HEMOGLOBIN A1C Lab Results  Component Value Date   HGBA1C 6.3 09/25/2019   TSH Recent Labs    09/25/19 1458 03/05/20 1520  TSH 2.47 2.002    External labs:   None   Medications and allergies  No Known Allergies   Outpatient Medications Prior to Visit  Medication Sig Dispense Refill  . aspirin 81 MG EC tablet Take 81 mg by mouth daily.    . cetirizine (ZYRTEC) 10 MG  tablet Take 10 mg by mouth daily.    . Chlorphen-Pseudoephed-APAP (CORICIDIN D PO) Take by mouth.    . Cholecalciferol (VITAMIN D3) 2000 UNITS TABS Take 1 tablet by mouth every evening.    . Cyanocobalamin (VITAMIN B 12 PO) Take 1 tablet by mouth every evening.    . ezetimibe (ZETIA) 10 MG tablet Take 1 tablet (10 mg total) by mouth daily after supper. 30 tablet 2  . levothyroxine (SYNTHROID) 75 MCG tablet TAKE 1 TABLET BY MOUTH ONCE A DAY ON AN EMPTY STOMACH.TAKE WITH A GLASS OF WATERAT LEAST 30-60 MIN BEFORE BREAKFAST (Patient taking differently: Take 75 mcg by mouth daily before breakfast.) 90 tablet 1  . lisinopril (ZESTRIL) 20 MG tablet Take 1 tablet (20 mg total) by mouth daily. 90 tablet 3  . nitroGLYCERIN (NITROSTAT) 0.4 MG SL tablet Place 1 tablet (0.4 mg total) under the tongue every 5 (five) minutes as needed for up to 25 days for chest pain. 25 tablet 3  . pantoprazole (PROTONIX) 20 MG tablet TAKE 1 TABLET BY MOUTH TWICE A DAY (Patient taking differently: Take 20 mg by mouth daily.) 180 tablet 2  . rosuvastatin (CRESTOR) 20 MG tablet Take 1 tablet (20 mg total) by mouth daily. 90 tablet 3  . ticagrelor (BRILINTA) 90 MG TABS tablet Take 1 tablet (90 mg total) by mouth 2 (two) times daily. 180 tablet 3  . metoprolol tartrate (LOPRESSOR) 25 MG tablet Take 1 tablet (25 mg total) by mouth 2 (two) times daily. 180 tablet 3  . hydrocortisone 2.5 % lotion Apply topically 2 (two) times daily. 59 mL 0   No facility-administered medications prior to visit.     Radiology:   No results found.  Cardiac Studies:   Echocardiogram 03/05/2020:   1. Due to mitral and calcification, diastolic function cannot be estimated. However appears to show pseudonormal pattern.. Left ventricular ejection fraction, by estimation, is 50 to 55%. The left ventricle has low normal function. The left ventricle  demonstrates regional wall motion abnormalities (see scoring diagram/findings for description). There is  mild left ventricular hypertrophy. Left ventricular diastolic parameters are indeterminate. There is mild of the left ventricular, entire inferior  wall. 2. Right ventricular systolic function is normal. The right ventricular size is normal. There is mildly elevated pulmonary artery systolic pressure. 3. Left atrial size was mildly dilated. 4. The mitral valve is normal in structure. Trivial mitral valve regurgitation. Moderate mitral annular calcification. 5. The aortic valve is tricuspid. There is mild calcification of the aortic valve. Aortic valve regurgitation is trivial. Mild aortic valve sclerosis is present, with no evidence of aortic valve stenosis. 6. The inferior vena cava is normal in size with <50% respiratory variability, suggesting right atrial pressure of 8 mmHg.  Left Heart Catheterization and stenting to RCA 03/05/20: LV: Normal size. Inferior akinesis. LVEF 40%. No significant MR. Severe mitral annular calcification evident. No pressure gradient across the aortic valve. LM: Large vessel, mildly calcified. CX: Very large vessel giving origin to large OM1 small OM 2 very large OM 3. Ostium of the circumflex has a 30% stenosis. Very large OM1 with high-grade and irregular marginal 95% stenosis in the proximal segment. Moderate amount of calcification is evident in the coronary vessels. Severe tortuosity is evident in the high OM1 and also large OM 3. LAD: Large caliber vessel giving origin to a moderate to large size D1. Mild luminal irregularities evident. RCA: Large vessel and dominant vessel. Tortuous in the mid to distal segment. It is occluded the proximal segment. Successful aspiration thrombectomy with Pronto V4 catheter followed by stenting with 4.0 x 30 mm resolute Onyx DES, stenosis reduced from 100% to 0% and TIMI 0 to TIMI-3 flow.  Coronary angioplasty to OM1 03/06/2020: OCT guided PTCA and stenting of the large OM1 with 2.5 x 15 mm resolute Onyx  DES, stenosis reduced from 99% to 0%, >95% stent apposition and expansion. No edge dissection.  Recommendation: Patient will hopefully be discharged home tomorrow if she remains stable. She will need dual antiplatelet therapy for a year in view of ACS. Continue risk factor modification is indicated. 115 mL contrast utilized.  EKG:   EKG 03/13/2020: Sinus bradycardia rate of 52 bpm, with borderline first-degree AV block..  Inferior infarct old.  Poor R wave progression, cannot exclude anteroseptal infarct old.  T wave abnormality, unchanged from previous.  Low voltage complexes, consider pulmonary disease pattern.  EKG 03/07/2020: Normal sinus rhythm with rate of 73 bpm, normal axis, T wave abnormality, consider inferolateral ischemia.  Compared to 03/06/2020, no significant change.  Assessment     ICD-10-CM   1. Coronary artery disease involving native coronary artery of native heart without angina pectoris  I25.10   2. Essential hypertension  I10   3. Hypercholesterolemia  E78.00      Medications Discontinued During This Encounter  Medication Reason  . hydrocortisone 2.5 % lotion Error  . metoprolol tartrate (LOPRESSOR) 25 MG tablet Change in therapy    Meds ordered this encounter  Medications  . carvedilol (COREG) 12.5 MG tablet    Sig: Take 1 tablet (12.5 mg total) by mouth 2 (two) times daily.    Dispense:  60 tablet    Refill:  3  Recommendations:   Amy Lin is a 87 y.o. Caucasian female with history of hypertension, hyperlipidemia, GERD who presented to [facility] emergency department 03/05/2020 with active chest pain and EKG revealing inferior STEMI. Recommend patient continue DAPT until at least 03/07/2021 if tolerated.   Patient presents for 6-week follow-up of coronary artery disease, hypertension, and hyperlipidemia.  No changes were made to her medications at last visit, however discussed diet and lifestyle modifications extensively.  Patient is presently doing  well without recurrence of chest pain.  She continues to progress in cardiac rehab and there are no clinical signs of heart failure.  She is tolerating dual antiplatelet therapy without bleeding diathesis.  However patient does report increased frequency of soft bowel movements since discharge from the hospital.  This may be due to metoprolol, therefore will switch her from metoprolol to carvedilol 12.5 mg twice daily.  His blood pressure is mildly elevated in the office today, however she reports home blood pressure readings which are better controlled.  Unfortunately patient does not bring a written log with her at this time.  Advised patient to check blood pressure daily and bring a written log to her next appointment to further evaluate blood pressure control.  We will continue aspirin, Brilinta, Crestor, lisinopril, and Zetia.  Again counseled patient regarding diet and lifestyle modifications.  Follow-up in 3 months, sooner if needed, for coronary artery disease, hypertension, and hyperlipidemia.   Alethia Berthold, PA-C 04/27/2020, 11:51 AM Office: [phone]

## 2020-04-23 NOTE — Progress Notes (Signed)
Daily Session Note  Patient Details  Name: Amy Lin MRN: [id number] Date of Birth: Aug 25, 1933 Referring Provider:   Flowsheet Row Cardiac Rehab from 03/30/2020 in [facility] Cardiac and Pulmonary Rehab  Referring Provider Clayton Lefort MD      Encounter Date: 04/23/2020  Check In:  Session Check In - 04/23/20 1027      Check-In   Supervising physician immediately available to respond to emergencies See telemetry face sheet for immediately available ER MD    Location [facility]-Cardiac & Pulmonary Rehab    Staff Present Birdie Sons, MPA, RN;Melissa Caiola RDN, Rowe Pavy, BA, ACSM CEP, Exercise Physiologist    Virtual Visit No    Medication changes reported     No    Fall or balance concerns reported    No    Warm-up and Cool-down Performed on first and last piece of equipment    Resistance Training Performed Yes    VAD Patient? No    PAD/SET Patient? No      Pain Assessment   Currently in Pain? No/denies              Social History   Tobacco Use  Smoking Status Never Smoker  Smokeless Tobacco Never Used    Goals Met:  Independence with exercise equipment Exercise tolerated well No report of cardiac concerns or symptoms Strength training completed today  Goals Unmet:  Not Applicable  Comments: Pt able to follow exercise prescription today without complaint.  Will continue to monitor for progression.    Dr. [name] Filbert is Medical Director for [facility].

## 2020-04-24 ENCOUNTER — Ambulatory Visit: Payer: PPO | Admitting: Student

## 2020-04-24 ENCOUNTER — Encounter: Payer: Self-pay | Admitting: Student

## 2020-04-24 ENCOUNTER — Other Ambulatory Visit: Payer: Self-pay

## 2020-04-24 VITALS — BP 142/58 | HR 59 | Temp 95.5°F | Resp 16 | Ht 64.0 in | Wt 153.0 lb

## 2020-04-24 DIAGNOSIS — I2510 Atherosclerotic heart disease of native coronary artery without angina pectoris: Secondary | ICD-10-CM | POA: Diagnosis not present

## 2020-04-24 DIAGNOSIS — I10 Essential (primary) hypertension: Secondary | ICD-10-CM | POA: Diagnosis not present

## 2020-04-24 DIAGNOSIS — E7800 Pure hypercholesterolemia, unspecified: Secondary | ICD-10-CM

## 2020-04-24 MED ORDER — CARVEDILOL 12.5 MG PO TABS: 12.5000 mg | ORAL_TABLET | Freq: Two times a day (BID) | ORAL | 3 refills | Status: DC

## 2020-04-28 ENCOUNTER — Other Ambulatory Visit: Payer: Self-pay

## 2020-04-28 DIAGNOSIS — I213 ST elevation (STEMI) myocardial infarction of unspecified site: Secondary | ICD-10-CM | POA: Diagnosis not present

## 2020-04-28 DIAGNOSIS — Z955 Presence of coronary angioplasty implant and graft: Secondary | ICD-10-CM

## 2020-04-28 NOTE — Progress Notes (Signed)
Daily Session Note  Patient Details  Name: Amy Lin MRN: [id number] Date of Birth: 08/18/1933 Referring Provider:   Flowsheet Row Cardiac Rehab from 03/30/2020 in [facility] Cardiac and Pulmonary Rehab  Referring Provider Clayton Lefort MD      Encounter Date: 04/28/2020  Check In:  Session Check In - 04/28/20 1108      Check-In   Supervising physician immediately available to respond to emergencies See telemetry face sheet for immediately available ER MD    Location [facility]-Cardiac & Pulmonary Rehab    Staff Present Birdie Sons, MPA, Elveria Rising, BA, ACSM CEP, Exercise Physiologist;Joseph Hood RCP,RRT,BSRT;Melissa Caiola RDN, LDN    Virtual Visit No    Medication changes reported     Yes    Comments stopped metoprolol and started 12.$RemoveBefore'5mg'kRooizevWBOHS$  carvedilol    Fall or balance concerns reported    No    Warm-up and Cool-down Performed on first and last piece of equipment    Resistance Training Performed Yes    VAD Patient? No    PAD/SET Patient? No      Pain Assessment   Currently in Pain? No/denies              Social History   Tobacco Use  Smoking Status Never Smoker  Smokeless Tobacco Never Used    Goals Met:  Independence with exercise equipment Exercise tolerated well No report of cardiac concerns or symptoms Strength training completed today  Goals Unmet:  Not Applicable  Comments: Pt able to follow exercise prescription today without complaint.  Will continue to monitor for progression. PHQ follow up and patient stated she feels better since she has been coming to exercise.     Dr. [name] Filbert is Medical Director for [place] and [facility].

## 2020-04-30 ENCOUNTER — Other Ambulatory Visit: Payer: Self-pay

## 2020-04-30 DIAGNOSIS — I213 ST elevation (STEMI) myocardial infarction of unspecified site: Secondary | ICD-10-CM | POA: Diagnosis not present

## 2020-04-30 DIAGNOSIS — Z955 Presence of coronary angioplasty implant and graft: Secondary | ICD-10-CM

## 2020-04-30 NOTE — Progress Notes (Signed)
Daily Session Note  Patient Details  Name: Amy Lin MRN: [id number] Date of Birth: [birth year] Referring Provider:   Flowsheet Row Cardiac Rehab from 03/30/2020 in [facility] Cardiac and Pulmonary Rehab  Referring Provider Clayton Lefort MD      Encounter Date: 04/30/2020  Check In:  Session Check In - 04/30/20 1030      Check-In   Supervising physician immediately available to respond to emergencies See telemetry face sheet for immediately available ER MD    Location [facility]-Cardiac & Pulmonary Rehab    Staff Present Basilia Jumbo, RN, BSN;Meredith Sherryll Burger, RN Sherryl Barters, MPA, Elveria Rising, BA, ACSM CEP, Exercise Physiologist    Virtual Visit No    Medication changes reported     No    Fall or balance concerns reported    No    Warm-up and Cool-down Performed on first and last piece of equipment    Resistance Training Performed Yes    VAD Patient? No    PAD/SET Patient? No      Pain Assessment   Currently in Pain? No/denies              Social History   Tobacco Use  Smoking Status Never Smoker  Smokeless Tobacco Never Used    Goals Met:  Independence with exercise equipment Exercise tolerated well No report of cardiac concerns or symptoms Strength training completed today  Goals Unmet:  Not Applicable  Comments: Pt able to follow exercise prescription today without complaint.  Will continue to monitor for progression.    Dr. [name] Filbert is Medical Director for [place] and [facility].

## 2020-05-05 ENCOUNTER — Telehealth: Payer: Self-pay

## 2020-05-05 ENCOUNTER — Encounter: Payer: PPO | Attending: Cardiology

## 2020-05-05 ENCOUNTER — Other Ambulatory Visit: Payer: Self-pay | Admitting: Student

## 2020-05-05 ENCOUNTER — Other Ambulatory Visit: Payer: Self-pay

## 2020-05-05 DIAGNOSIS — I213 ST elevation (STEMI) myocardial infarction of unspecified site: Secondary | ICD-10-CM

## 2020-05-05 DIAGNOSIS — Z955 Presence of coronary angioplasty implant and graft: Secondary | ICD-10-CM | POA: Insufficient documentation

## 2020-05-05 DIAGNOSIS — I10 Essential (primary) hypertension: Secondary | ICD-10-CM

## 2020-05-05 DIAGNOSIS — I2510 Atherosclerotic heart disease of native coronary artery without angina pectoris: Secondary | ICD-10-CM

## 2020-05-05 MED ORDER — CARVEDILOL 12.5 MG PO TABS: 6.2500 mg | ORAL_TABLET | Freq: Two times a day (BID) | ORAL | 3 refills | Status: DC

## 2020-05-05 NOTE — Telephone Encounter (Signed)
Please advise patient to reduce carvedilol from 12.5 to 6.25 mg BID and continue to monitor BP and sx.

## 2020-05-05 NOTE — Progress Notes (Signed)
Daily Session Note  Patient Details  Name: Amy Lin MRN: [id number] Date of Birth: 04-19-1933 Referring Provider:   Flowsheet Row Cardiac Rehab from 03/30/2020 in [facility] Cardiac and Pulmonary Rehab  Referring Provider Clayton Lefort MD      Encounter Date: 05/05/2020  Check In:  Session Check In - 05/05/20 1057      Check-In   Supervising physician immediately available to respond to emergencies See telemetry face sheet for immediately available ER MD    Location [facility]-Cardiac & Pulmonary Rehab    Staff Present Birdie Sons, MPA, Elveria Rising, BA, ACSM CEP, Exercise Physiologist;Kara Eliezer Bottom, MS Exercise Physiologist    Virtual Visit No    Medication changes reported     No    Fall or balance concerns reported    No    Warm-up and Cool-down Performed on first and last piece of equipment    Resistance Training Performed Yes    VAD Patient? No    PAD/SET Patient? No      Pain Assessment   Currently in Pain? No/denies              Social History   Tobacco Use  Smoking Status Never Smoker  Smokeless Tobacco Never Used    Goals Met:  Independence with exercise equipment Exercise tolerated well No report of cardiac concerns or symptoms Strength training completed today  Goals Unmet:  Not Applicable  Comments: Pt able to follow exercise prescription today without complaint.  Will continue to monitor for progression.    Dr. [name] Filbert is Medical Director for [place] and [facility].

## 2020-05-05 NOTE — Telephone Encounter (Signed)
Patient is aware 

## 2020-05-05 NOTE — Telephone Encounter (Signed)
Patient called that she has been getting low bp readings the lowest she has gotten was 75/42. Usually in the evening towards the night her bp is low whenever she takes it and she feels like she is going to faint please advise

## 2020-05-07 ENCOUNTER — Other Ambulatory Visit: Payer: Self-pay

## 2020-05-07 DIAGNOSIS — I213 ST elevation (STEMI) myocardial infarction of unspecified site: Secondary | ICD-10-CM

## 2020-05-07 DIAGNOSIS — Z955 Presence of coronary angioplasty implant and graft: Secondary | ICD-10-CM

## 2020-05-07 NOTE — Progress Notes (Signed)
Daily Session Note  Patient Details  Name: Amy Lin MRN: [id number] Date of Birth: [birth year] Referring Provider:   Flowsheet Row Cardiac Rehab from 03/30/2020 in [facility] Cardiac and Pulmonary Rehab  Referring Provider Clayton Lefort MD      Encounter Date: 05/07/2020  Check In:  Session Check In - 05/07/20 1044      Check-In   Supervising physician immediately available to respond to emergencies See telemetry face sheet for immediately available ER MD    Location [facility]-Cardiac & Pulmonary Rehab    Staff Present Birdie Sons, MPA, Elveria Rising, BA, ACSM CEP, Exercise Physiologist;Melissa Caiola RDN, LDN    Virtual Visit No    Medication changes reported     No    Fall or balance concerns reported    No    Warm-up and Cool-down Performed on first and last piece of equipment    Resistance Training Performed Yes    VAD Patient? No    PAD/SET Patient? No      Pain Assessment   Currently in Pain? No/denies              Social History   Tobacco Use  Smoking Status Never Smoker  Smokeless Tobacco Never Used    Goals Met:  Independence with exercise equipment Exercise tolerated well No report of cardiac concerns or symptoms Strength training completed today  Goals Unmet:  Not Applicable  Comments: Pt able to follow exercise prescription today without complaint.  Will continue to monitor for progression.    Dr. [name] Filbert is Medical Director for [place] and [facility].

## 2020-05-12 ENCOUNTER — Other Ambulatory Visit: Payer: Self-pay

## 2020-05-12 DIAGNOSIS — Z955 Presence of coronary angioplasty implant and graft: Secondary | ICD-10-CM

## 2020-05-12 DIAGNOSIS — I213 ST elevation (STEMI) myocardial infarction of unspecified site: Secondary | ICD-10-CM | POA: Diagnosis not present

## 2020-05-12 NOTE — Progress Notes (Signed)
Daily Session Note  Patient Details  Name: Amy Lin MRN: [id number] Date of Birth: 10-25-1933 Referring Provider:   Flowsheet Row Cardiac Rehab from 03/30/2020 in [facility] Cardiac and Pulmonary Rehab  Referring Provider Clayton Lefort MD      Encounter Date: 05/12/2020  Check In:  Session Check In - 05/12/20 1054      Check-In   Supervising physician immediately available to respond to emergencies See telemetry face sheet for immediately available ER MD    Staff Present Birdie Sons, MPA, RN;Amanda Sommer, BA, ACSM CEP, Exercise Physiologist;Melissa Caiola RDN, LDN;Joseph Hood RCP,RRT,BSRT    Virtual Visit No    Medication changes reported     No    Fall or balance concerns reported    No    Warm-up and Cool-down Performed on first and last piece of equipment    Resistance Training Performed Yes    VAD Patient? No    PAD/SET Patient? No      Pain Assessment   Currently in Pain? No/denies              Social History   Tobacco Use  Smoking Status Never Smoker  Smokeless Tobacco Never Used    Goals Met:  Independence with exercise equipment Exercise tolerated well No report of cardiac concerns or symptoms Strength training completed today  Goals Unmet:  Not Applicable  Comments: Pt able to follow exercise prescription today without complaint.  Will continue to monitor for progression.    Dr. [name] Filbert is Medical Director for [place] and [facility].

## 2020-05-14 ENCOUNTER — Other Ambulatory Visit: Payer: Self-pay

## 2020-05-14 DIAGNOSIS — I213 ST elevation (STEMI) myocardial infarction of unspecified site: Secondary | ICD-10-CM

## 2020-05-14 DIAGNOSIS — Z955 Presence of coronary angioplasty implant and graft: Secondary | ICD-10-CM

## 2020-05-14 NOTE — Progress Notes (Signed)
Daily Session Note  Patient Details  Name: Amy Lin MRN: [id number] Date of Birth: [birth year] Referring Provider:   Flowsheet Row Cardiac Rehab from 03/30/2020 in [facility] Cardiac and Pulmonary Rehab  Referring Provider Clayton Lefort MD      Encounter Date: 05/14/2020  Check In:  Session Check In - 05/14/20 1037      Check-In   Supervising physician immediately available to respond to emergencies See telemetry face sheet for immediately available ER MD    Location [facility]-Cardiac & Pulmonary Rehab    Staff Present Birdie Sons, MPA, RN;Melissa Caiola RDN, Rowe Pavy, BA, ACSM CEP, Exercise Physiologist    Virtual Visit No    Medication changes reported     No    Fall or balance concerns reported    No    Warm-up and Cool-down Performed on first and last piece of equipment    Resistance Training Performed Yes    VAD Patient? No    PAD/SET Patient? No      Pain Assessment   Currently in Pain? No/denies              Social History   Tobacco Use  Smoking Status Never Smoker  Smokeless Tobacco Never Used    Goals Met:  Independence with exercise equipment Exercise tolerated well No report of cardiac concerns or symptoms Strength training completed today  Goals Unmet:  Not Applicable  Comments: Pt able to follow exercise prescription today without complaint.  Will continue to monitor for progression.    Dr. [name] Filbert is Medical Director for [place] and [facility].

## 2020-05-19 ENCOUNTER — Other Ambulatory Visit: Payer: Self-pay

## 2020-05-19 ENCOUNTER — Encounter: Payer: PPO | Admitting: *Deleted

## 2020-05-19 DIAGNOSIS — I213 ST elevation (STEMI) myocardial infarction of unspecified site: Secondary | ICD-10-CM | POA: Diagnosis not present

## 2020-05-19 DIAGNOSIS — Z955 Presence of coronary angioplasty implant and graft: Secondary | ICD-10-CM

## 2020-05-19 NOTE — Progress Notes (Signed)
Daily Session Note  Patient Details  Name: Amy Lin MRN: [id number] Date of Birth: 10/18/1933 Referring Provider:   Flowsheet Row Cardiac Rehab from 03/30/2020 in [facility] Cardiac and Pulmonary Rehab  Referring Provider Gangi, Jay MD      Encounter Date: 05/19/2020  Check In:  Session Check In - 05/19/20 1146      Check-In   Supervising physician immediately available to respond to emergencies See telemetry face sheet for immediately available ER MD    Location [facility]-Cardiac & Pulmonary Rehab    Staff Present Susanne Bice, RN, BSN, CCRP;Laureen Brown, BS, RRT, CPFT;Melissa Caiola RDN, LDN;Joseph Hood RCP,RRT,BSRT    Virtual Visit No    Medication changes reported     No    Fall or balance concerns reported    No    Warm-up and Cool-down Performed on first and last piece of equipment    Resistance Training Performed Yes    VAD Patient? No    PAD/SET Patient? No      Pain Assessment   Currently in Pain? No/denies              Social History   Tobacco Use  Smoking Status Never Smoker  Smokeless Tobacco Never Used    Goals Met:  Independence with exercise equipment Exercise tolerated well No report of cardiac concerns or symptoms  Goals Unmet:  Not Applicable  Comments: Pt able to follow exercise prescription today without complaint.  Will continue to monitor for progression.    Dr. Mark Miller is Medical Director for [facility] and [facility]. 

## 2020-05-20 ENCOUNTER — Encounter: Payer: Self-pay | Admitting: *Deleted

## 2020-05-20 DIAGNOSIS — I213 ST elevation (STEMI) myocardial infarction of unspecified site: Secondary | ICD-10-CM

## 2020-05-20 DIAGNOSIS — Z955 Presence of coronary angioplasty implant and graft: Secondary | ICD-10-CM

## 2020-05-20 NOTE — Progress Notes (Signed)
Cardiac Individual Treatment Plan  Patient Details  Name: CELESTINA GIRONDA MRN: [id number] Date of Birth: 11-04-1933 Referring Provider:   Flowsheet Row Cardiac Rehab from 03/30/2020 in [facility] Cardiac and Pulmonary Rehab  Referring Provider Clayton Lefort MD      Initial Encounter Date:  Flowsheet Row Cardiac Rehab from 03/30/2020 in [facility] Cardiac and Pulmonary Rehab  Date 03/30/20      Visit Diagnosis: Status post coronary artery stent placement  ST elevation myocardial infarction (STEMI), unspecified artery ([place])  Patient's Home Medications on Admission:  Current Outpatient Medications:  .  aspirin 81 MG EC tablet, Take 81 mg by mouth daily., Disp: , Rfl:  .  carvedilol (COREG) 12.5 MG tablet, Take 0.5 tablets (6.25 mg total) by mouth 2 (two) times daily., Disp: 60 tablet, Rfl: 3 .  cetirizine (ZYRTEC) 10 MG tablet, Take 10 mg by mouth daily., Disp: , Rfl:  .  Chlorphen-Pseudoephed-APAP (CORICIDIN D PO), Take by mouth., Disp: , Rfl:  .  Cholecalciferol (VITAMIN D3) 2000 UNITS TABS, Take 1 tablet by mouth every evening., Disp: , Rfl:  .  Cyanocobalamin (VITAMIN B 12 PO), Take 1 tablet by mouth every evening., Disp: , Rfl:  .  ezetimibe (ZETIA) 10 MG tablet, Take 1 tablet (10 mg total) by mouth daily after supper., Disp: 30 tablet, Rfl: 2 .  levothyroxine (SYNTHROID) 75 MCG tablet, TAKE 1 TABLET BY MOUTH ONCE A DAY ON AN EMPTY STOMACH.TAKE WITH A GLASS OF WATERAT LEAST 30-60 MIN BEFORE BREAKFAST (Patient taking differently: Take 75 mcg by mouth daily before breakfast.), Disp: 90 tablet, Rfl: 1 .  lisinopril (ZESTRIL) 20 MG tablet, Take 1 tablet (20 mg total) by mouth daily., Disp: 90 tablet, Rfl: 3 .  nitroGLYCERIN (NITROSTAT) 0.4 MG SL tablet, Place 1 tablet (0.4 mg total) under the tongue every 5 (five) minutes as needed for up to 25 days for chest pain., Disp: 25 tablet, Rfl: 3 .  pantoprazole (PROTONIX) 20 MG tablet, TAKE 1 TABLET BY MOUTH TWICE A DAY (Patient taking differently: Take 20 mg  by mouth daily.), Disp: 180 tablet, Rfl: 2 .  rosuvastatin (CRESTOR) 20 MG tablet, Take 1 tablet (20 mg total) by mouth daily., Disp: 90 tablet, Rfl: 3 .  ticagrelor (BRILINTA) 90 MG TABS tablet, Take 1 tablet (90 mg total) by mouth 2 (two) times daily., Disp: 180 tablet, Rfl: 3  Past Medical History: Past Medical History:  Diagnosis Date  . Allergy   . Coronary artery disease   . GERD (gastroesophageal reflux disease)   . Hyperlipidemia   . Hypertension    diuretic for edema at first  . Hypothyroidism   . Myocardial infarction ([place])   . Osteoarthritis, multiple sites   . Osteoporosis     Tobacco Use: Social History   Tobacco Use  Smoking Status Never Smoker  Smokeless Tobacco Never Used    Labs: Recent Review Flowsheet Data    Labs for ITP Cardiac and Pulmonary Rehab Latest Ref Rng & Units 07/20/2017 09/13/2018 07/19/2019 09/25/2019 03/05/2020   Cholestrol 0 - 200 mg/dL 251(H) 304(H) 300(H) 194 194   LDLCALC 0 - 99 mg/dL 178(H) 237(H) 207(H) 119(H) 114(H)   LDLDIRECT mg/dL - - - - -   HDL >40 mg/dL 50.80 50.60 47(L) 53.40 51   Trlycerides <150 mg/dL 110.0 84.0 260(H) 107.0 146   Hemoglobin A1c 4.6 - 6.5 % - - - 6.3 -   TCO2 22 - 32 mmol/L - - - - 22  Exercise Target Goals: Exercise Program Goal: Individual exercise prescription set using results from initial 6 min walk test and THRR while considering  patient's activity barriers and safety.   Exercise Prescription Goal: Initial exercise prescription builds to 30-45 minutes a day of aerobic activity, 2-3 days per week.  Home exercise guidelines will be given to patient during program as part of exercise prescription that the participant will acknowledge.   Education: Aerobic Exercise: - Group verbal and visual presentation on the components of exercise prescription. Introduces F.I.T.T principle from ACSM for exercise prescriptions.  Reviews F.I.T.T. principles of aerobic exercise including progression. Written  material given at graduation.   Education: Resistance Exercise: - Group verbal and visual presentation on the components of exercise prescription. Introduces F.I.T.T principle from ACSM for exercise prescriptions  Reviews F.I.T.T. principles of resistance exercise including progression. Written material given at graduation.    Education: Exercise & Equipment Safety: - Individual verbal instruction and demonstration of equipment use and safety with use of the equipment. Flowsheet Row Cardiac Rehab from 05/14/2020 in [facility] Cardiac and Pulmonary Rehab  Date 03/30/20  Educator [facility]  Instruction Review Code 1- Verbalizes Understanding      Education: Exercise Physiology & General Exercise Guidelines: - Group verbal and written instruction with models to review the exercise physiology of the cardiovascular system and associated critical values. Provides general exercise guidelines with specific guidelines to those with heart or lung disease.  Flowsheet Row Cardiac Rehab from 05/14/2020 in [facility] Cardiac and Pulmonary Rehab  Date 05/14/20  Educator [facility]  Instruction Review Code 1- Verbalizes Understanding      Education: Flexibility, Balance, Mind/Body Relaxation: - Group verbal and visual presentation with interactive activity on the components of exercise prescription. Introduces F.I.T.T principle from ACSM for exercise prescriptions. Reviews F.I.T.T. principles of flexibility and balance exercise training including progression. Also discusses the mind body connection.  Reviews various relaxation techniques to help reduce and manage stress (i.e. Deep breathing, progressive muscle relaxation, and visualization). Balance handout provided to take home. Written material given at graduation. Flowsheet Row Cardiac Rehab from 05/14/2020 in [facility] Cardiac and Pulmonary Rehab  Date 04/02/20  Educator [facility]  Instruction Review Code 1- Verbalizes Understanding      Activity Barriers & Risk Stratification:   Activity Barriers & Cardiac Risk Stratification - 03/30/20 1150      Activity Barriers & Cardiac Risk Stratification   Activity Barriers Arthritis;Muscular Weakness;Deconditioning;Shortness of Breath;Balance Concerns   arthritis all over, big toes tend to have painful flare ups   Cardiac Risk Stratification Moderate           6 Minute Walk:  6 Minute Walk    Row Name 03/30/20 1149         6 Minute Walk   Distance 1100 feet     Walk Time 6 minutes     # of Rest Breaks 0     MPH 2.08     METS 1.7     RPE 11     Perceived Dyspnea  2     VO2 Peak 5.94     Symptoms Yes (comment)     Comments fatigue at end, SOB     Resting HR 51 bpm     Resting BP 118/66     Resting Oxygen Saturation  97 %     Exercise Oxygen Saturation  during 6 min walk 96 %     Max Ex. HR 88 bpm     Max Ex. BP 134/72  2 Minute Post BP 124/72            Oxygen Initial Assessment:   Oxygen Re-Evaluation:   Oxygen Discharge (Final Oxygen Re-Evaluation):   Initial Exercise Prescription:  Initial Exercise Prescription - 03/30/20 1100      Date of Initial Exercise RX and Referring Provider   Date 03/30/20    Referring Provider Clayton Lefort MD      Treadmill   MPH 1.8    Grade 0.5    Minutes 15    METs 2.5      Recumbant Elliptical   Level 1    RPM 50    Minutes 15    METs 2      REL-XR   Level 1    Speed 50    Minutes 15    METs 2      T5 Nustep   Level 2    SPM 80    Minutes 15    METs 2      Prescription Details   Frequency (times per week) 2    Duration Progress to 30 minutes of continuous aerobic without signs/symptoms of physical distress      Intensity   THRR 40-80% of Max Heartrate 85-119    Ratings of Perceived Exertion 11-13    Perceived Dyspnea 0-4      Progression   Progression Continue to progress workloads to maintain intensity without signs/symptoms of physical distress.      Resistance Training   Training Prescription Yes    Weight 3 lb    Reps  10-15           Perform Capillary Blood Glucose checks as needed.  Exercise Prescription Changes:  Exercise Prescription Changes    Row Name 03/30/20 1100 04/06/20 1500 04/20/20 1000 05/05/20 1200       Response to Exercise   Blood Pressure (Admit) 118/66 118/68 144/82 132/60    Blood Pressure (Exercise) 134/72 128/62 140/66 138/68    Blood Pressure (Exit) 124/72 114/58 126/64 128/60    Heart Rate (Admit) 51 bpm 57 bpm 59 bpm 68 bpm    Heart Rate (Exercise) 88 bpm 84 bpm 89 bpm 88 bpm    Heart Rate (Exit) 61 bpm 65 bpm 62 bpm 59 bpm    Oxygen Saturation (Admit) 97 % -- -- --    Oxygen Saturation (Exercise) 96 % -- -- --    Rating of Perceived Exertion (Exercise) 11 13 13 14     Perceived Dyspnea (Exercise) 2 -- -- --    Symptoms fatigue, SOB none none none    Comments walk test results second full day of exercise -- --    Duration -- Progress to 30 minutes of  aerobic without signs/symptoms of physical distress Continue with 30 min of aerobic exercise without signs/symptoms of physical distress. Continue with 30 min of aerobic exercise without signs/symptoms of physical distress.    Intensity -- THRR unchanged THRR unchanged THRR unchanged         Progression   Progression -- Continue to progress workloads to maintain intensity without signs/symptoms of physical distress. Continue to progress workloads to maintain intensity without signs/symptoms of physical distress. Continue to progress workloads to maintain intensity without signs/symptoms of physical distress.    Average METs -- 2.29 2.25 2.81         Resistance Training   Training Prescription -- Yes Yes Yes    Weight -- 3 lb 3 lb 3 lb    Reps --  10-15 10-15 10-15         Interval Training   Interval Training -- No No No         Treadmill   MPH -- 2.5 1.8 1.8    Grade -- 0.5 0.5 0.5    Minutes -- 15 15 15     METs -- 3.09 2.5 2.5         Recumbant Bike   Level -- -- -- 3    Watts -- -- -- 30    Minutes -- --  -- 15    METs -- -- -- 2.86         NuStep   Level -- -- -- 2    Minutes -- -- -- 15    METs -- -- -- 2.1         Recumbant Elliptical   Level -- 1 1 --    Minutes -- 15 15 --    METs -- 1.5 1.4 --         REL-XR   Level -- -- -- 1    Minutes -- -- -- 15    METs -- -- -- 3.3         Home Exercise Plan   Plans to continue exercise at -- -- -- Home (comment)  walk and staff videos    Frequency -- -- -- Add 2 additional days to program exercise sessions.    Initial Home Exercises Provided -- -- -- 05/05/20           Exercise Comments:  Exercise Comments    Row Name 03/31/20 1114           Exercise Comments First full day of exercise!  Patient was oriented to gym and equipment including functions, settings, policies, and procedures.  Patient's individual exercise prescription and treatment plan were reviewed.  All starting workloads were established based on the results of the 6 minute walk test done at initial orientation visit.  The plan for exercise progression was also introduced and progression will be customized based on patient's performance and goals.              Exercise Goals and Review:  Exercise Goals    Row Name 03/30/20 1157             Exercise Goals   Increase Physical Activity Yes       Intervention Provide advice, education, support and counseling about physical activity/exercise needs.;Develop an individualized exercise prescription for aerobic and resistive training based on initial evaluation findings, risk stratification, comorbidities and participant's personal goals.       Expected Outcomes Short Term: Attend rehab on a regular basis to increase amount of physical activity.;Long Term: Add in home exercise to make exercise part of routine and to increase amount of physical activity.;Long Term: Exercising regularly at least 3-5 days a week.       Increase Strength and Stamina Yes       Intervention Provide advice, education, support and  counseling about physical activity/exercise needs.;Develop an individualized exercise prescription for aerobic and resistive training based on initial evaluation findings, risk stratification, comorbidities and participant's personal goals.       Expected Outcomes Short Term: Increase workloads from initial exercise prescription for resistance, speed, and METs.;Short Term: Perform resistance training exercises routinely during rehab and add in resistance training at home;Long Term: Improve cardiorespiratory fitness, muscular endurance and strength as measured by increased METs and functional capacity (6MWT)  Able to understand and use rate of perceived exertion (RPE) scale Yes       Intervention Provide education and explanation on how to use RPE scale       Expected Outcomes Short Term: Able to use RPE daily in rehab to express subjective intensity level;Long Term:  Able to use RPE to guide intensity level when exercising independently       Able to understand and use Dyspnea scale Yes       Intervention Provide education and explanation on how to use Dyspnea scale       Expected Outcomes Short Term: Able to use Dyspnea scale daily in rehab to express subjective sense of shortness of breath during exertion;Long Term: Able to use Dyspnea scale to guide intensity level when exercising independently       Knowledge and understanding of Target Heart Rate Range (THRR) Yes       Intervention Provide education and explanation of THRR including how the numbers were predicted and where they are located for reference       Expected Outcomes Short Term: Able to state/look up THRR;Short Term: Able to use daily as guideline for intensity in rehab;Long Term: Able to use THRR to govern intensity when exercising independently       Able to check pulse independently Yes       Intervention Provide education and demonstration on how to check pulse in carotid and radial arteries.;Review the importance of being able to  check your own pulse for safety during independent exercise       Expected Outcomes Short Term: Able to explain why pulse checking is important during independent exercise;Long Term: Able to check pulse independently and accurately       Understanding of Exercise Prescription Yes       Intervention Provide education, explanation, and written materials on patient's individual exercise prescription       Expected Outcomes Short Term: Able to explain program exercise prescription;Long Term: Able to explain home exercise prescription to exercise independently              Exercise Goals Re-Evaluation :  Exercise Goals Re-Evaluation    Row Name 03/31/20 1114 04/06/20 1516 04/20/20 1005 04/30/20 1106 05/05/20 1257     Exercise Goal Re-Evaluation   Exercise Goals Review Able to understand and use rate of perceived exertion (RPE) scale;Able to understand and use Dyspnea scale;Knowledge and understanding of Target Heart Rate Range (THRR);Understanding of Exercise Prescription Increase Physical Activity;Increase Strength and Stamina;Understanding of Exercise Prescription Increase Physical Activity;Increase Strength and Stamina Increase Physical Activity;Increase Strength and Stamina Increase Physical Activity;Increase Strength and Stamina;Able to understand and use rate of perceived exertion (RPE) scale;Knowledge and understanding of Target Heart Rate Range (THRR);Able to check pulse independently   Comments Reviewed RPE and dyspnea scales, THR and program prescription with pt today.  Pt voiced understanding and was given a copy of goals to take home. Monserratt is off to a good start in rehab.  She has completed her first two full days of exercise.  We will continue to monitor her progress. Geraldine is doing well with exercise.  She is at 1.8 mph and .5% grade.  Staff will encourage her to increase levels on machines. Kameka reports walking at home daily - 15 minutes in am and 15 minutes in pm (RPE-12). She thinks she  shoud increase her time. EP will go over home exercise with her. Reviewed home exercise with pt today.  Pt plans to walk/use staff  videos for exercise.  Reviewed THR, pulse, RPE, sign and symptoms, pulse oximetery and when to call 911 or MD.  Also discussed weather considerations and indoor options.  Pt voiced understanding.   Expected Outcomes Short: Use RPE daily to regulate intensity. Long: Follow program prescription in THR. Short: Continue to attend rehab regularly Long: Continue to follow program prescription Short: try level 2 on XR and more grade on TM  Long: build stamina Short: move to 20 minute walk sessions  Long: build stamina Short:  monitor HR when exercising Long:  maintain exercise on her own          Discharge Exercise Prescription (Final Exercise Prescription Changes):  Exercise Prescription Changes - 05/05/20 1200      Response to Exercise   Blood Pressure (Admit) 132/60    Blood Pressure (Exercise) 138/68    Blood Pressure (Exit) 128/60    Heart Rate (Admit) 68 bpm    Heart Rate (Exercise) 88 bpm    Heart Rate (Exit) 59 bpm    Rating of Perceived Exertion (Exercise) 14    Symptoms none    Duration Continue with 30 min of aerobic exercise without signs/symptoms of physical distress.    Intensity THRR unchanged      Progression   Progression Continue to progress workloads to maintain intensity without signs/symptoms of physical distress.    Average METs 2.81      Resistance Training   Training Prescription Yes    Weight 3 lb    Reps 10-15      Interval Training   Interval Training No      Treadmill   MPH 1.8    Grade 0.5    Minutes 15    METs 2.5      Recumbant Bike   Level 3    Watts 30    Minutes 15    METs 2.86      NuStep   Level 2    Minutes 15    METs 2.1      REL-XR   Level 1    Minutes 15    METs 3.3      Home Exercise Plan   Plans to continue exercise at Home (comment)   walk and staff videos   Frequency Add 2 additional days to  program exercise sessions.    Initial Home Exercises Provided 05/05/20           Nutrition:  Target Goals: Understanding of nutrition guidelines, daily intake of sodium 1500mg , cholesterol 200mg , calories 30% from fat and 7% or less from saturated fats, daily to have 5 or more servings of fruits and vegetables.  Education: All About Nutrition: -Group instruction provided by verbal, written material, interactive activities, discussions, models, and posters to present general guidelines for heart healthy nutrition including fat, fiber, MyPlate, the role of sodium in heart healthy nutrition, utilization of the nutrition label, and utilization of this knowledge for meal planning. Follow up email sent as well. Written material given at graduation. Flowsheet Row Cardiac Rehab from 05/14/2020 in [facility] Cardiac and Pulmonary Rehab  Date 04/09/20  Educator [facility]  Instruction Review Code 1- Verbalizes Understanding      Biometrics:  Pre Biometrics - 03/30/20 1158      Pre Biometrics   Height 5' 4.2" (1.631 m)    Weight 154 lb 3.2 oz (69.9 kg)    BMI (Calculated) 26.29    Single Leg Stand 3.8 seconds  Nutrition Therapy Plan and Nutrition Goals:  Nutrition Therapy & Goals - 03/30/20 1132      Nutrition Therapy   Diet Heart healthy, low Na    Drug/Food Interactions Statins/Certain Fruits    Protein (specify units) 55g    Fiber 25 grams    Whole Grain Foods 3 servings    Saturated Fats 12 max. grams    Fruits and Vegetables 8 servings/day    Sodium 1.5 grams      Personal Nutrition Goals   Nutrition Goal ST: switch to whole grain bread, include one extra serving of vegetables with lunch LT: eat at least 5 servings of fruits/vegetables per day, make at least half of grain eaten whole grains.    Comments B: scrambled egg (butter), toast (white with jelly and butter), cantaloupe - 2 cups coffee (cream and sugar) L: fruit, sandwich, cheese toast (light lunch) D: vegetables, sweet  potato - once per week, hamburger (1x/week), porkloin, chicken. Drinks: water. Discussed heart halthy eating and small changes.      Intervention Plan   Intervention Prescribe, educate and counsel regarding individualized specific dietary modifications aiming towards targeted core components such as weight, hypertension, lipid management, diabetes, heart failure and other comorbidities.;Nutrition handout(s) given to patient.    Expected Outcomes Short Term Goal: Understand basic principles of dietary content, such as calories, fat, sodium, cholesterol and nutrients.;Short Term Goal: A plan has been developed with personal nutrition goals set during dietitian appointment.;Long Term Goal: Adherence to prescribed nutrition plan.           Nutrition Assessments:  MEDIFICTS Score Key:  ?70 Need to make dietary changes   40-70 Heart Healthy Diet  ? 40 Therapeutic Level Cholesterol Diet  Flowsheet Row Cardiac Rehab from 03/30/2020 in [facility] Cardiac and Pulmonary Rehab  Picture Your Plate Total Score on Admission 66     Picture Your Plate Scores:  <65 Unhealthy dietary pattern with much room for improvement.  41-50 Dietary pattern unlikely to meet recommendations for good health and room for improvement.  51-60 More healthful dietary pattern, with some room for improvement.   >60 Healthy dietary pattern, although there may be some specific behaviors that could be improved.    Nutrition Goals Re-Evaluation:  Nutrition Goals Re-Evaluation    [place] Name 04/30/20 1114             Goals   Current Weight 186 lb (84.4 kg)       Nutrition Goal ST: include one extra serving of vegetables with lunch - bean salad, and roasted vegetables LT: eat at least 5 servings of fruits/vegetables per day, make at least half of grain eaten whole grains.       Comment She has switched to whole grain bread and reports it is ok. She reports not doing as well with vegetables with lunch - she feels preparing  them is a barrier - she shops 1x/week. She has no difficulty with chopping or cutting. She has been eating some kale salad. Suggested bean salad and roasted vegetables for meal prep vegetables - Annaliyah would like to try this.       Expected Outcome ST: include one extra serving of vegetables with lunch - bean salad, and roasted vegetables LT: eat at least 5 servings of fruits/vegetables per day, make at least half of grain eaten whole grains.              Nutrition Goals Discharge (Final Nutrition Goals Re-Evaluation):  Nutrition Goals Re-Evaluation - 04/30/20 1114  Goals   Current Weight 186 lb (84.4 kg)    Nutrition Goal ST: include one extra serving of vegetables with lunch - bean salad, and roasted vegetables LT: eat at least 5 servings of fruits/vegetables per day, make at least half of grain eaten whole grains.    Comment She has switched to whole grain bread and reports it is ok. She reports not doing as well with vegetables with lunch - she feels preparing them is a barrier - she shops 1x/week. She has no difficulty with chopping or cutting. She has been eating some kale salad. Suggested bean salad and roasted vegetables for meal prep vegetables - Daisee would like to try this.    Expected Outcome ST: include one extra serving of vegetables with lunch - bean salad, and roasted vegetables LT: eat at least 5 servings of fruits/vegetables per day, make at least half of grain eaten whole grains.           Psychosocial: Target Goals: Acknowledge presence or absence of significant depression and/or stress, maximize coping skills, provide positive support system. Participant is able to verbalize types and ability to use techniques and skills needed for reducing stress and depression.   Education: Stress, Anxiety, and Depression - Group verbal and visual presentation to define topics covered.  Reviews how body is impacted by stress, anxiety, and depression.  Also discusses healthy ways to  reduce stress and to treat/manage anxiety and depression.  Written material given at graduation. Flowsheet Row Cardiac Rehab from 05/14/2020 in [facility] Cardiac and Pulmonary Rehab  Date 05/07/20  Educator [facility]  Instruction Review Code 1- [employer] Understanding      Education: Sleep Hygiene -Provides group verbal and written instruction about how sleep can affect your health.  Define sleep hygiene, discuss sleep cycles and impact of sleep habits. Review good sleep hygiene tips.    Initial Review & Psychosocial Screening:  Initial Psych Review & Screening - 03/20/20 1307      Initial Review   Current issues with Current Stress Concerns    Source of Stress Concerns Unable to participate in former interests or hobbies;Unable to perform yard/household activities      [place]? Yes   daughter     Barriers   Psychosocial barriers to participate in program There are no identifiable barriers or psychosocial needs.      Screening Interventions   Interventions Encouraged to exercise;Provide feedback about the scores to participant;To provide support and resources with identified psychosocial needs    Expected Outcomes Long Term Goal: Stressors or current issues are controlled or eliminated.;Short Term goal: Utilizing psychosocial counselor, staff and physician to assist with identification of specific Stressors or current issues interfering with healing process. Setting desired goal for each stressor or current issue identified.;Short Term goal: Identification and review with participant of any Quality of Life or Depression concerns found by scoring the questionnaire.;Long Term goal: The participant improves quality of Life and PHQ9 Scores as seen by post scores and/or verbalization of changes           Quality of Life Scores:   Quality of Life - 03/30/20 1216      Quality of Life   Select Quality of Life      Quality of Life Scores   Health/Function Pre 19 %     Socioeconomic Pre 24.86 %    Psych/Spiritual Pre 23.93 %    Family Pre 25.5 %    GLOBAL Pre 22.08 %  Scores of 19 and below usually indicate a poorer quality of life in these areas.  A difference of  2-3 points is a clinically meaningful difference.  A difference of 2-3 points in the total score of the Quality of Life Index has been associated with significant improvement in overall quality of life, self-image, physical symptoms, and general health in studies assessing change in quality of life.  PHQ-9: Recent Review Flowsheet Data    Depression screen [facility] 2/9 04/28/2020 03/30/2020 09/25/2019 09/13/2018 07/16/2018   Decreased Interest 1 1 0 0 0   Down, Depressed, Hopeless 0 1 0 0 0   PHQ - 2 Score 1 2 0 0 0   Altered sleeping 0 0 - 0 -   Tired, decreased energy 2 2 - 0 -   Change in appetite 0 1 - 0 -   Feeling bad or failure about yourself  0 1 - 0 -   Trouble concentrating 2 0 - 0 -   Moving slowly or fidgety/restless 0 1 - 0 -   Suicidal thoughts 0 0 - 0 -   PHQ-9 Score 5 7 - 0 -   Difficult doing work/chores Somewhat difficult Somewhat difficult - Not difficult at all -     Interpretation of Total Score  Total Score Depression Severity:  1-4 = Minimal depression, 5-9 = Mild depression, 10-14 = Moderate depression, 15-19 = Moderately severe depression, 20-27 = Severe depression   Psychosocial Evaluation and Intervention:  Psychosocial Evaluation - 03/20/20 1331      Psychosocial Evaluation & Interventions   Interventions Encouraged to exercise with the program and follow exercise prescription    Comments Ms. Boody states she is slowly feeling better after her MI. She still feels very tired at times and think it may be her body adjusting to the medications. She has started walking some around the house and is looking forward to becoming more active in Cardiac Rehab. Her daughter is very supportive and wants to be involved in her heart healthy lifestyle. Ms. Ficek doesn't report  any new stress and her current stress level is "nothing she can't handle." She hopes to boost her stamina and remain independent as long as possible.    Expected Outcomes Short: attend cardiac rehab for education and exercise. Long: develop positive self care habits.    Continue Psychosocial Services  Follow up required by staff           Psychosocial Re-Evaluation:  Psychosocial Re-Evaluation    [place] Name 04/28/20 1124 04/30/20 1118           Psychosocial Re-Evaluation   Current issues with Current Stress Concerns Current Stress Concerns      Comments PHQ follow up: scores improved from a 7 to 5. States she feels better since coming here to exercise. She is concerned about her grandson - he is working for [employer] instead of his "great job before" - he wants to own his own store, he has gotten up to Health and safety inspector. He is now in [place] and she does not like that. She has a good support system in her son and daughter. she reports no problems with sleep and even feels she is sleepier than usual. She likes to sit outside and in the window near the sun to reduce stress.  She would like to mow her own lawn on the riding mower as it relaxes her and gives her something to do, but her family won't let her.  Expected Outcomes to continue to improve throughout rehab program as evidenced by improved scores and continued feeling of improved mood/health ST: continue stress reducing activities and attending rehab LT: continue with positive outlook      Interventions Stress management education;Encouraged to attend Cardiac Rehabilitation for the exercise Stress management education;Encouraged to attend Cardiac Rehabilitation for the exercise      Continue Psychosocial Services  Follow up required by staff Follow up required by staff             Initial Review   Source of Stress Concerns -- Unable to participate in former interests or hobbies;Unable to perform yard/household activities;Family              Psychosocial Discharge (Final Psychosocial Re-Evaluation):  Psychosocial Re-Evaluation - 04/30/20 1118      Psychosocial Re-Evaluation   Current issues with Current Stress Concerns    Comments She is concerned about her grandson - he is working for [employer] instead of his "great job before" - he wants to own his own store, he has gotten up to Health and safety inspector. He is now in [place] and she does not like that. She has a good support system in her son and daughter. she reports no problems with sleep and even feels she is sleepier than usual. She likes to sit outside and in the window near the sun to reduce stress.  She would like to mow her own lawn on the riding mower as it relaxes her and gives her something to do, but her family won't let her.    Expected Outcomes ST: continue stress reducing activities and attending rehab LT: continue with positive outlook    Interventions Stress management education;Encouraged to attend Cardiac Rehabilitation for the exercise    Continue Psychosocial Services  Follow up required by staff      Initial Review   Source of Stress Concerns Unable to participate in former interests or hobbies;Unable to perform yard/household activities;Family           Vocational Rehabilitation: Provide vocational rehab assistance to qualifying candidates.   Vocational Rehab Evaluation & Intervention:  Vocational Rehab - 03/20/20 1307      Initial Vocational Rehab Evaluation & Intervention   Assessment shows need for Vocational Rehabilitation No           Education: Education Goals: Education classes will be provided on a variety of topics geared toward better understanding of heart health and risk factor modification. Participant will state understanding/return demonstration of topics presented as noted by education test scores.  Learning Barriers/Preferences:  Learning Barriers/Preferences - 03/20/20 1307      Learning Barriers/Preferences   Learning  Barriers None    Learning Preferences None           General Cardiac Education Topics:  AED/CPR: - Group verbal and written instruction with the use of models to demonstrate the basic use of the AED with the basic ABC's of resuscitation.   Anatomy and Cardiac Procedures: - Group verbal and visual presentation and models provide information about basic cardiac anatomy and function. Reviews the testing methods done to diagnose heart disease and the outcomes of the test results. Describes the treatment choices: Medical Management, Angioplasty, or Coronary Bypass Surgery for treating various heart conditions including Myocardial Infarction, Angina, Valve Disease, and Cardiac Arrhythmias.  Written material given at graduation.   Medication Safety: - Group verbal and visual instruction to review commonly prescribed medications for heart and lung disease. Reviews the medication, class of the  drug, and side effects. Includes the steps to properly store meds and maintain the prescription regimen.  Written material given at graduation. Flowsheet Row Cardiac Rehab from 05/14/2020 in [facility] Cardiac and Pulmonary Rehab  Date 04/16/20  Educator SB  Instruction Review Code 1- Verbalizes Understanding      Intimacy: - Group verbal instruction through game format to discuss how heart and lung disease can affect sexual intimacy. Written material given at graduation..   Know Your Numbers and Heart Failure: - Group verbal and visual instruction to discuss disease risk factors for cardiac and pulmonary disease and treatment options.  Reviews associated critical values for Overweight/Obesity, Hypertension, Cholesterol, and Diabetes.  Discusses basics of heart failure: signs/symptoms and treatments.  Introduces Heart Failure Zone chart for action plan for heart failure.  Written material given at graduation. Flowsheet Row Cardiac Rehab from 05/14/2020 in [facility] Cardiac and Pulmonary Rehab  Date 04/23/20   Educator [facility]  Instruction Review Code 1- Verbalizes Understanding      Infection Prevention: - Provides verbal and written material to individual with discussion of infection control including proper hand washing and proper equipment cleaning during exercise session. Flowsheet Row Cardiac Rehab from 05/14/2020 in [facility] Cardiac and Pulmonary Rehab  Date 03/30/20  Educator [facility]  Instruction Review Code 1- Verbalizes Understanding      Falls Prevention: - Provides verbal and written material to individual with discussion of falls prevention and safety. Flowsheet Row Cardiac Rehab from 05/14/2020 in [facility] Cardiac and Pulmonary Rehab  Date 03/30/20  Educator [facility]  Instruction Review Code 1- Verbalizes Understanding      Other: -Provides group and verbal instruction on various topics (see comments)   Knowledge Questionnaire Score:  Knowledge Questionnaire Score - 03/30/20 1217      Knowledge Questionnaire Score   Pre Score 26/26           Core Components/Risk Factors/Patient Goals at Admission:  Personal Goals and Risk Factors at Admission - 03/30/20 1219      Core Components/Risk Factors/Patient Goals on Admission    Weight Management Yes;Weight Loss    Intervention Weight Management: Develop a combined nutrition and exercise program designed to reach desired caloric intake, while maintaining appropriate intake of nutrient and fiber, sodium and fats, and appropriate energy expenditure required for the weight goal.;Weight Management: Provide education and appropriate resources to help participant work on and attain dietary goals.;Weight Management/Obesity: Establish reasonable short term and long term weight goals.;Obesity: Provide education and appropriate resources to help participant work on and attain dietary goals.    Admit Weight 154 lb 3.2 oz (69.9 kg)    Goal Weight: Short Term 150 lb (68 kg)    Goal Weight: Long Term 150 lb (68 kg)    Expected Outcomes Short Term: Continue  to assess and modify interventions until short term weight is achieved;Long Term: Adherence to nutrition and physical activity/exercise program aimed toward attainment of established weight goal;Weight Loss: Understanding of general recommendations for a balanced deficit meal plan, which promotes 1-2 lb weight loss per week and includes a negative energy balance of [phone] kcal/d;Understanding recommendations for meals to include 15-35% energy as protein, 25-35% energy from fat, 35-60% energy from carbohydrates, less than 200mg  of dietary cholesterol, 20-35 gm of total fiber daily;Understanding of distribution of calorie intake throughout the day with the consumption of 4-5 meals/snacks    Hypertension Yes    Intervention Provide education on lifestyle modifcations including regular physical activity/exercise, weight management, moderate sodium restriction and increased consumption of  fresh fruit, vegetables, and low fat dairy, alcohol moderation, and smoking cessation.;Monitor prescription use compliance.    Expected Outcomes Short Term: Continued assessment and intervention until BP is < 140/77mm HG in hypertensive participants. < 130/45mm HG in hypertensive participants with diabetes, heart failure or chronic kidney disease.;Long Term: Maintenance of blood pressure at goal levels.    Lipids Yes    Intervention Provide education and support for participant on nutrition & aerobic/resistive exercise along with prescribed medications to achieve LDL 70mg , HDL >40mg .    Expected Outcomes Short Term: Participant states understanding of desired cholesterol values and is compliant with medications prescribed. Participant is following exercise prescription and nutrition guidelines.;Long Term: Cholesterol controlled with medications as prescribed, with individualized exercise RX and with personalized nutrition plan. Value goals: LDL < 70mg , HDL > 40 mg.           Education:Diabetes - Individual verbal and  written instruction to review signs/symptoms of diabetes, desired ranges of glucose level fasting, after meals and with exercise. Acknowledge that pre and post exercise glucose checks will be done for 3 sessions at entry of program.   Core Components/Risk Factors/Patient Goals Review:   Goals and Risk Factor Review    Row Name 04/30/20 1106             Core Components/Risk Factors/Patient Goals Review   Personal Goals Review Improve shortness of breath with ADL's;Hypertension;Lipids       Review She reports having some shortness of breath with ADLs which has not been improving much since starting rehab. She has limited energy - she thinks it may be her medication (she has recently talked to her doctor about and her MD changed one of her medications. She has to go to the bathroom frequently - 4-5x/day, 3 times this morning before rehab. Her BP this morning was 132/60. She takes her BP at home and sometimes her BP 109/50 at home. Discussed drinking water when BP gets moderately low. She continues to exercise and practice healthy eating habits to help with HLD.       Expected Outcomes ST: see if medication change helps with energy LT: continue to monitor risk factors              Core Components/Risk Factors/Patient Goals at Discharge (Final Review):   Goals and Risk Factor Review - 04/30/20 1106      Core Components/Risk Factors/Patient Goals Review   Personal Goals Review Improve shortness of breath with ADL's;Hypertension;Lipids    Review She reports having some shortness of breath with ADLs which has not been improving much since starting rehab. She has limited energy - she thinks it may be her medication (she has recently talked to her doctor about and her MD changed one of her medications. She has to go to the bathroom frequently - 4-5x/day, 3 times this morning before rehab. Her BP this morning was 132/60. She takes her BP at home and sometimes her BP 109/50 at home. Discussed drinking  water when BP gets moderately low. She continues to exercise and practice healthy eating habits to help with HLD.    Expected Outcomes ST: see if medication change helps with energy LT: continue to monitor risk factors           ITP Comments:  ITP Comments    Row Name 03/20/20 1322 03/30/20 1148 03/31/20 1114 04/22/20 0957 05/20/20 1610   ITP Comments Initial telephone orientation completed. Diagnosis can be found in CHL 2/3. EP orientation scheduled for 2/28  at 10am. Completed 6MWT and gym orientation. Initial ITP created and sent for review to Dr. [name] Filbert, Medical Director. First full day of exercise!  Patient was oriented to gym and equipment including functions, settings, policies, and procedures.  Patient's individual exercise prescription and treatment plan were reviewed.  All starting workloads were established based on the results of the 6 minute walk test done at initial orientation visit.  The plan for exercise progression was also introduced and progression will be customized based on patient's performance and goals. 30 Day review completed. Medical Director ITP review done, changes made as directed, and signed approval by Medical Director.  New to program 30 Day review completed. Medical Director ITP review done, changes made as directed, and signed approval by Medical Director.          Comments: 30 Day review completed. Medical Director ITP review done, changes made as directed, and signed approval by Medical Director.

## 2020-05-21 ENCOUNTER — Other Ambulatory Visit: Payer: Self-pay

## 2020-05-21 DIAGNOSIS — I213 ST elevation (STEMI) myocardial infarction of unspecified site: Secondary | ICD-10-CM

## 2020-05-21 DIAGNOSIS — Z955 Presence of coronary angioplasty implant and graft: Secondary | ICD-10-CM

## 2020-05-21 NOTE — Progress Notes (Signed)
Daily Session Note  Patient Details  Name: Amy Lin MRN: [id number] Date of Birth: 1933-11-20 Referring Provider:   Flowsheet Row Cardiac Rehab from 03/30/2020 in [facility] Cardiac and Pulmonary Rehab  Referring Provider Clayton Lefort MD      Encounter Date: 05/21/2020  Check In:  Session Check In - 05/21/20 1026      Check-In   Supervising physician immediately available to respond to emergencies See telemetry face sheet for immediately available ER MD    Location [facility]-Cardiac & Pulmonary Rehab    Staff Present Birdie Sons, MPA, RN;Melissa Caiola RDN, Rowe Pavy, BA, ACSM CEP, Exercise Physiologist    Virtual Visit No    Medication changes reported     No    Fall or balance concerns reported    No    Warm-up and Cool-down Performed on first and last piece of equipment    Resistance Training Performed Yes    VAD Patient? No    PAD/SET Patient? No      Pain Assessment   Currently in Pain? No/denies              Social History   Tobacco Use  Smoking Status Never Smoker  Smokeless Tobacco Never Used    Goals Met:  Independence with exercise equipment Exercise tolerated well No report of cardiac concerns or symptoms Strength training completed today  Goals Unmet:  Not Applicable  Comments: Pt able to follow exercise prescription today without complaint.  Will continue to monitor for progression.    Dr. [name] Filbert is Medical Director for [place] and [facility].

## 2020-05-28 ENCOUNTER — Other Ambulatory Visit: Payer: Self-pay

## 2020-05-28 DIAGNOSIS — Z955 Presence of coronary angioplasty implant and graft: Secondary | ICD-10-CM

## 2020-05-28 DIAGNOSIS — I213 ST elevation (STEMI) myocardial infarction of unspecified site: Secondary | ICD-10-CM | POA: Diagnosis not present

## 2020-05-28 NOTE — Progress Notes (Signed)
Daily Session Note  Patient Details  Name: Amy Lin MRN: [id number] Date of Birth: [birth year] Referring Provider:   Flowsheet Row Cardiac Rehab from 03/30/2020 in [facility] Cardiac and Pulmonary Rehab  Referring Provider Clayton Lefort MD      Encounter Date: 05/28/2020  Check In:  Session Check In - 05/28/20 1042      Check-In   Supervising physician immediately available to respond to emergencies See telemetry face sheet for immediately available ER MD    Location [facility]-Cardiac & Pulmonary Rehab    Staff Present Birdie Sons, MPA, RN;Melissa Caiola RDN, Rowe Pavy, BA, ACSM CEP, Exercise Physiologist    Virtual Visit No    Medication changes reported     No    Fall or balance concerns reported    No    Warm-up and Cool-down Performed on first and last piece of equipment    Resistance Training Performed Yes    VAD Patient? No    PAD/SET Patient? No      Pain Assessment   Currently in Pain? No/denies              Social History   Tobacco Use  Smoking Status Never Smoker  Smokeless Tobacco Never Used    Goals Met:  Independence with exercise equipment Exercise tolerated well No report of cardiac concerns or symptoms Strength training completed today  Goals Unmet:  Not Applicable  Comments: Pt able to follow exercise prescription today without complaint.  Will continue to monitor for progression.    Dr. [name] Filbert is Medical Director for [place] and [facility].

## 2020-06-02 ENCOUNTER — Other Ambulatory Visit: Payer: Self-pay

## 2020-06-02 ENCOUNTER — Encounter: Payer: PPO | Attending: Cardiology

## 2020-06-02 DIAGNOSIS — I213 ST elevation (STEMI) myocardial infarction of unspecified site: Secondary | ICD-10-CM | POA: Diagnosis not present

## 2020-06-02 DIAGNOSIS — Z955 Presence of coronary angioplasty implant and graft: Secondary | ICD-10-CM

## 2020-06-02 NOTE — Progress Notes (Signed)
Daily Session Note  Patient Details  Name: Amy Lin MRN: [id number] Date of Birth: [birth year] Referring Provider:   Flowsheet Row Cardiac Rehab from 03/30/2020 in [facility] Cardiac and Pulmonary Rehab  Referring Provider Clayton Lefort MD      Encounter Date: 06/02/2020  Check In:  Session Check In - 06/02/20 1115      Check-In   Supervising physician immediately available to respond to emergencies See telemetry face sheet for immediately available ER MD    Location [facility]-Cardiac & Pulmonary Rehab    Staff Present Birdie Sons, MPA, RN    Virtual Visit No    Medication changes reported     No    Fall or balance concerns reported    No    Warm-up and Cool-down Performed on first and last piece of equipment    Resistance Training Performed Yes    VAD Patient? No    PAD/SET Patient? No      Pain Assessment   Currently in Pain? No/denies              Social History   Tobacco Use  Smoking Status Never Smoker  Smokeless Tobacco Never Used    Goals Met:  Independence with exercise equipment Exercise tolerated well No report of cardiac concerns or symptoms Strength training completed today  Goals Unmet:  Not Applicable  Comments: Pt able to follow exercise prescription today without complaint.  Will continue to monitor for progression.    Dr. [name] Filbert is Medical Director for [place] and [facility].

## 2020-06-03 ENCOUNTER — Other Ambulatory Visit: Payer: Self-pay | Admitting: Cardiology

## 2020-06-03 MED ORDER — LISINOPRIL 20 MG PO TABS: 10.0000 mg | ORAL_TABLET | Freq: Every day | ORAL | 3 refills | Status: DC

## 2020-06-03 NOTE — Telephone Encounter (Signed)
Patient called office to report she continues to have fatigue and is noticing continued low blood pressures as below:   05/31/20 126/66 before medications  06/02/20 114/56 before medications  06/02/20 after morning meds 537 mmHg systolic   She is continuing to complete cardiac rehab without issue. Advised her to reduce lisinopril to 10 mg once daily. Will plan to follow up in the office in 1 week.     ICD-10-CM   1. Coronary artery disease involving native coronary artery of native heart without angina pectoris  I25.10   2. Essential hypertension  English C Elieser Tetrick, PA-C 06/03/2020, 2:56 PM Office: [phone]

## 2020-06-04 ENCOUNTER — Other Ambulatory Visit: Payer: Self-pay

## 2020-06-04 DIAGNOSIS — I213 ST elevation (STEMI) myocardial infarction of unspecified site: Secondary | ICD-10-CM

## 2020-06-04 DIAGNOSIS — Z955 Presence of coronary angioplasty implant and graft: Secondary | ICD-10-CM | POA: Diagnosis not present

## 2020-06-04 NOTE — Progress Notes (Signed)
Daily Session Note  Patient Details  Name: ARIELA MOCHIZUKI MRN: [id number] Date of Birth: 04-01-1933 Referring Provider:   Flowsheet Row Cardiac Rehab from 03/30/2020 in [facility] Cardiac and Pulmonary Rehab  Referring Provider Clayton Lefort MD      Encounter Date: 06/04/2020  Check In:  Session Check In - 06/04/20 1114      Check-In   Supervising physician immediately available to respond to emergencies See telemetry face sheet for immediately available ER MD    Location [facility]-Cardiac & Pulmonary Rehab    Staff Present Birdie Sons, MPA, RN;Melissa Caiola RDN, LDN;Jessica Luan Pulling, MA, RCEP, CCRP, CCET    Virtual Visit No    Medication changes reported     No    Fall or balance concerns reported    No    Warm-up and Cool-down Performed on first and last piece of equipment    Resistance Training Performed Yes    VAD Patient? No    PAD/SET Patient? No      Pain Assessment   Currently in Pain? No/denies              Social History   Tobacco Use  Smoking Status Never Smoker  Smokeless Tobacco Never Used    Goals Met:  Independence with exercise equipment Exercise tolerated well Personal goals reviewed No report of cardiac concerns or symptoms Strength training completed today  Goals Unmet:  Not Applicable  Comments: Pt able to follow exercise prescription today without complaint.  Will continue to monitor for progression.    Dr. [name] Filbert is Medical Director for [place] and [facility].

## 2020-06-09 ENCOUNTER — Other Ambulatory Visit: Payer: Self-pay

## 2020-06-09 DIAGNOSIS — I213 ST elevation (STEMI) myocardial infarction of unspecified site: Secondary | ICD-10-CM

## 2020-06-09 DIAGNOSIS — Z955 Presence of coronary angioplasty implant and graft: Secondary | ICD-10-CM | POA: Diagnosis not present

## 2020-06-09 NOTE — Progress Notes (Signed)
Primary Physician/Referring:  [name], NP  Patient ID: [name], female    DOB: 1933-11-29, 87 y.o.   MRN: [id number]  Chief Complaint  Patient presents with  . Coronary Artery Disease  . Follow-up   HPI:    ONEAL BIGLOW  is a 87 y.o. Caucasian female with history of hypertension, hyperlipidemia, GERD who presented to [facility] emergency department 03/05/2020 with active chest pain and EKG revealing inferior STEMI. Recommend patient continue DAPT until at least 03/07/2021 if tolerated.   Patient was taken emergently for cardiac catheterization and underwent successful angioplasty and stenting with 4.0 x 30 mm resolute Onyx to the dominant proximal RCA.  She had residual high-grade and ulcerated stenosis in the OM1 branch of circumflex which was felt to be unstable and underwent staged intervention to the vessel, with placement of 2.5 x 15 mm resolute Onyx DES.  She is presently on guideline directed medical therapy including aspirin, lisinopril, metoprolol titrate, rosuvastatin, Brilinta, as well as Zetia and as needed nitroglycerin.   Patient presents for 1 week follow up of blood pressure management.  Patient called the office 06/03/2020 with concerns of low blood pressure as well as fatigue, at that time advised her to reduce lisinopril to 10 mg once daily. Since reducing lisinopril patient has not recurrence of dizziness and fatigue has significantly improved. She reports occasional episodes of soft blood pressure, including yesterday after cardiac rehab at 326 mmHg systolic. However, patient was asymptomatic despite soft BP.   Patient reports she has cut back on salt intake and has tried to make other heart healthy diet modifications. She is progressing well in cardiac rehab. She does continue to have bilaterally lower leg edema which is stable. Patient denies chest pain, palpitations, syncope, near syncope, leg swelling, orthopnea, PND.  Past Medical History:  Diagnosis Date  .  Allergy   . Coronary artery disease   . GERD (gastroesophageal reflux disease)   . Hyperlipidemia   . Hypertension    diuretic for edema at first  . Hypothyroidism   . Myocardial infarction ([place])   . Osteoarthritis, multiple sites   . Osteoporosis    Past Surgical History:  Procedure Laterality Date  . ABDOMINAL HYSTERECTOMY    . BACK SURGERY  1999   Discectomy and Ray cage  . BLADDER REPAIR  ~2010  . CARDIAC CATHETERIZATION  03/05/2020  . CARPAL TUNNEL RELEASE  8/15  . COLONOSCOPY W/ BIOPSIES N/A 2016  . CORONARY STENT INTERVENTION N/A 03/06/2020   Procedure: CORONARY STENT INTERVENTION;  Surgeon: Adrian Prows, MD;  Location: [place] [facility];  Service: Cardiovascular;  Laterality: N/A;  . CORONARY THROMBECTOMY N/A 03/05/2020   Procedure: Coronary Thrombectomy;  Surgeon: Adrian Prows, MD;  Location: [place] [facility];  Service: Cardiovascular;  Laterality: N/A;  . CORONARY/GRAFT ACUTE MI REVASCULARIZATION N/A 03/05/2020   Procedure: Coronary/Graft Acute MI Revascularization;  Surgeon: Adrian Prows, MD;  Location: [place] [facility];  Service: Cardiovascular;  Laterality: N/A;  . INTRAVASCULAR ULTRASOUND/IVUS N/A 03/06/2020   Procedure: Intravascular Ultrasound/IVUS;  Surgeon: Adrian Prows, MD;  Location: [place] [facility];  Service: Cardiovascular;  Laterality: N/A;  . LEFT HEART CATH AND CORONARY ANGIOGRAPHY N/A 03/05/2020   Procedure: LEFT HEART CATH AND CORONARY ANGIOGRAPHY;  Surgeon: Adrian Prows, MD;  Location: [place] [facility];  Service: Cardiovascular;  Laterality: N/A;  . MENISCECTOMY Left 2/15   Dr Veverly Fells  . REFRACTIVE SURGERY Bilateral 07/2019  . UPPER GI ENDOSCOPY N/A Feb 2017  Family History  Problem Relation Age of Onset  . Heart disease Mother   . Stroke Father   . Hypertension Father   . Heart disease Father   . Diabetes Father     Social History   Tobacco Use  . Smoking status: Never Smoker  . Smokeless tobacco: Never Used  Substance Use Topics  . Alcohol use: No    Marital Status: Divorced   ROS  Review of Systems  Constitutional: Negative for malaise/fatigue and weight gain.  Cardiovascular: Positive for dyspnea on exertion (improving) and leg swelling (stable). Negative for chest pain, claudication, near-syncope, orthopnea, palpitations, paroxysmal nocturnal dyspnea and syncope.  Hematologic/Lymphatic: Does not bruise/bleed easily.  Gastrointestinal: Negative for melena.  Neurological: Negative for dizziness and weakness.    Objective  Blood pressure 126/70, pulse 74, temperature 98.3 F (36.8 C), temperature source Temporal, resp. rate 16, height 5\' 4"  (1.626 m), weight 151 lb 12.8 oz (68.9 kg), SpO2 97 %.  Vitals with BMI 06/10/2020 04/24/2020 03/30/2020  Height 5\' 4"  5\' 4"  5' 4.2"  Weight 151 lbs 13 oz 153 lbs 154 lbs 3 oz  BMI 26.04 15.40 08.67  Systolic 619 509 -  Diastolic 70 58 -  Pulse 74 59 -     Physical Exam Vitals reviewed.  Constitutional:      Appearance: Normal appearance.  HENT:     Head: Normocephalic and atraumatic.  Cardiovascular:     Rate and Rhythm: Normal rate and regular rhythm.     Pulses: Intact distal pulses.          Carotid pulses are 2+ on the right side and 2+ on the left side.      Radial pulses are 2+ on the right side and 2+ on the left side.       Femoral pulses are 2+ on the right side and 2+ on the left side.      Popliteal pulses are 2+ on the right side and 2+ on the left side.       Dorsalis pedis pulses are 0 on the right side and 0 on the left side.       Posterior tibial pulses are 0 on the left side.     Heart sounds: S1 normal and S2 normal. Murmur heard.   Midsystolic murmur is present with a grade of 2/6 at the upper right sternal border. No gallop.   Pulmonary:     Effort: Pulmonary effort is normal. No respiratory distress.     Breath sounds: No wheezing, rhonchi or rales.  Musculoskeletal:     Right lower leg: Edema (trace, ankle) present.     Left lower leg: Edema (trace, ankle)  present.  Skin:    General: Skin is warm and dry.     Capillary Refill: Capillary refill takes less than 2 seconds.  Neurological:     General: No focal deficit present.     Mental Status: She is alert and oriented to person, place, and time.  Psychiatric:        Mood and Affect: Mood normal.        Behavior: Behavior normal.     Laboratory examination:   Recent Labs    03/05/20 1013 03/06/20 0055 03/07/20 0215  NA 139 137 140  K 3.7 3.8 3.8  CL 106 107 107  CO2 21* 20* 22  GLUCOSE 129* 108* 110*  BUN 14 13 18   CREATININE 0.80 0.90 1.14*  CALCIUM 9.4 8.5* 8.9  GFRNONAA >60 >60  47*   CrCl cannot be calculated (Patient's most recent lab result is older than the maximum 21 days allowed.).  CMP Latest Ref Rng & Units 03/07/2020 03/06/2020 03/05/2020  Glucose 70 - 99 mg/dL 110(H) 108(H) 129(H)  BUN 8 - 23 mg/dL 18 13 14   Creatinine 0.44 - 1.00 mg/dL 1.14(H) 0.90 0.80  Sodium 135 - 145 mmol/L 140 137 139  Potassium 3.5 - 5.1 mmol/L 3.8 3.8 3.7  Chloride 98 - 111 mmol/L 107 107 106  CO2 22 - 32 mmol/L 22 20(L) 21(L)  Calcium 8.9 - 10.3 mg/dL 8.9 8.5(L) 9.4  Total Protein 6.5 - 8.1 g/dL - - 6.8  Total Bilirubin 0.3 - 1.2 mg/dL - - 0.7  Alkaline Phos 38 - 126 U/L - - 51  AST 15 - 41 U/L - - 22  ALT 0 - 44 U/L - - 16   CBC Latest Ref Rng & Units 03/07/2020 03/06/2020 03/05/2020  WBC 4.0 - 10.5 K/uL 7.9 8.2 9.6  Hemoglobin 12.0 - 15.0 g/dL 11.7(L) 10.8(L) 12.8  Hematocrit 36.0 - 46.0 % 35.6(L) 32.8(L) 36.1  Platelets 150 - 400 K/uL 151 144(L) 169    Lipid Panel Recent Labs    07/19/19 1613 09/25/19 1458 03/05/20 1013  CHOL 300* 194 194  TRIG 260* 107.0 146  LDLCALC 207* 119* 114*  VLDL  --  21.4 29  HDL 47* 53.40 51  CHOLHDL 6.4* 4 3.8    HEMOGLOBIN A1C Lab Results  Component Value Date   HGBA1C 6.3 09/25/2019   TSH Recent Labs    09/25/19 1458 03/05/20 1520  TSH 2.47 2.002    External labs:   None   Medications and allergies  No Known Allergies    Outpatient Medications Prior to Visit  Medication Sig Dispense Refill  . aspirin 81 MG EC tablet Take 81 mg by mouth daily.    . carvedilol (COREG) 12.5 MG tablet Take 0.5 tablets (6.25 mg total) by mouth 2 (two) times daily. 60 tablet 3  . cetirizine (ZYRTEC) 10 MG tablet Take 10 mg by mouth daily.    . Cholecalciferol (VITAMIN D3) 2000 UNITS TABS Take 1 tablet by mouth every evening.    . Cyanocobalamin (VITAMIN B 12 PO) Take 1 tablet by mouth every evening.    . ezetimibe (ZETIA) 10 MG tablet TAKE 1 TABLET BY MOUTH ONCE A DAY AFTER SUPPER 90 tablet 2  . levothyroxine (SYNTHROID) 75 MCG tablet TAKE 1 TABLET BY MOUTH ONCE A DAY ON AN EMPTY STOMACH.TAKE WITH A GLASS OF WATERAT LEAST 30-60 MIN BEFORE BREAKFAST (Patient taking differently: Take 75 mcg by mouth daily before breakfast.) 90 tablet 1  . lisinopril (ZESTRIL) 20 MG tablet Take 0.5 tablets (10 mg total) by mouth daily. 90 tablet 3  . nitroGLYCERIN (NITROSTAT) 0.4 MG SL tablet Place 1 tablet (0.4 mg total) under the tongue every 5 (five) minutes as needed for up to 25 days for chest pain. 25 tablet 3  . pantoprazole (PROTONIX) 20 MG tablet TAKE 1 TABLET BY MOUTH TWICE A DAY (Patient taking differently: Take 20 mg by mouth 2 (two) times daily.) 180 tablet 2  . rosuvastatin (CRESTOR) 20 MG tablet Take 1 tablet (20 mg total) by mouth daily. 90 tablet 3  . ticagrelor (BRILINTA) 90 MG TABS tablet Take 1 tablet (90 mg total) by mouth 2 (two) times daily. 180 tablet 3  . ofloxacin (OCUFLOX) 0.3 % ophthalmic solution     . Chlorphen-Pseudoephed-APAP (CORICIDIN D PO) Take by mouth.  No facility-administered medications prior to visit.     Radiology:   No results found.  Cardiac Studies:   Echocardiogram 03/05/2020:   1. Due to mitral and calcification, diastolic function cannot be estimated. However appears to show pseudonormal pattern.. Left ventricular ejection fraction, by estimation, is 50 to 55%. The left ventricle has low  normal function. The left ventricle  demonstrates regional wall motion abnormalities (see scoring diagram/findings for description). There is mild left ventricular hypertrophy. Left ventricular diastolic parameters are indeterminate. There is mild of the left ventricular, entire inferior  wall. 2. Right ventricular systolic function is normal. The right ventricular size is normal. There is mildly elevated pulmonary artery systolic pressure. 3. Left atrial size was mildly dilated. 4. The mitral valve is normal in structure. Trivial mitral valve regurgitation. Moderate mitral annular calcification. 5. The aortic valve is tricuspid. There is mild calcification of the aortic valve. Aortic valve regurgitation is trivial. Mild aortic valve sclerosis is present, with no evidence of aortic valve stenosis. 6. The inferior vena cava is normal in size with <50% respiratory variability, suggesting right atrial pressure of 8 mmHg.  Left Heart Catheterization and stenting to RCA 03/05/20: LV: Normal size. Inferior akinesis. LVEF 40%. No significant MR. Severe mitral annular calcification evident. No pressure gradient across the aortic valve. LM: Large vessel, mildly calcified. CX: Very large vessel giving origin to large OM1 small OM 2 very large OM 3. Ostium of the circumflex has a 30% stenosis. Very large OM1 with high-grade and irregular marginal 95% stenosis in the proximal segment. Moderate amount of calcification is evident in the coronary vessels. Severe tortuosity is evident in the high OM1 and also large OM 3. LAD: Large caliber vessel giving origin to a moderate to large size D1. Mild luminal irregularities evident. RCA: Large vessel and dominant vessel. Tortuous in the mid to distal segment. It is occluded the proximal segment. Successful aspiration thrombectomy with Pronto V4 catheter followed by stenting with 4.0 x 30 mm resolute Onyx DES, stenosis reduced from 100% to 0% and TIMI 0  to TIMI-3 flow.  Coronary angioplasty to OM1 03/06/2020: OCT guided PTCA and stenting of the large OM1 with 2.5 x 15 mm resolute Onyx DES, stenosis reduced from 99% to 0%, >95% stent apposition and expansion. No edge dissection.  Recommendation: Patient will hopefully be discharged home tomorrow if she remains stable. She will need dual antiplatelet therapy for a year in view of ACS. Continue risk factor modification is indicated. 115 mL contrast utilized.  EKG:   EKG 03/13/2020: Sinus bradycardia rate of 52 bpm, with borderline first-degree AV block..  Inferior infarct old.  Poor R wave progression, cannot exclude anteroseptal infarct old.  T wave abnormality, unchanged from previous.  Low voltage complexes, consider pulmonary disease pattern.  EKG 03/07/2020: Normal sinus rhythm with rate of 73 bpm, normal axis, T wave abnormality, consider inferolateral ischemia.  Compared to 03/06/2020, no significant change.  Assessment     ICD-10-CM   1. Essential hypertension  I10   2. Coronary artery disease involving native coronary artery of native heart without angina pectoris  I25.10 Lipid Panel With LDL/HDL Ratio  3. History of ST elevation myocardial infarction (STEMI)  I25.2      Medications Discontinued During This Encounter  Medication Reason  . Chlorphen-Pseudoephed-APAP (CORICIDIN D PO) Error    Meds ordered this encounter  Medications  . furosemide (LASIX) 40 MG tablet    Sig: Take 1 tablet (40 mg total) by mouth daily as  needed for fluid or edema.    Dispense:  30 tablet    Refill:  3  . potassium chloride SA (KLOR-CON) 20 MEQ tablet    Sig: Take 1 tablet (20 mEq total) by mouth daily as needed.    Dispense:  30 tablet    Refill:  3    Recommendations:   LEILY CAPEK is a 87 y.o. Caucasian female with history of hypertension, hyperlipidemia, GERD who presented to [facility] emergency department 03/05/2020 with active chest pain and EKG revealing inferior STEMI. Recommend  patient continue DAPT until at least 03/07/2021 if tolerated.   Patient presents for 1 week follow up of blood pressure management.  Patient called the office 06/03/2020 with concerns of low blood pressure as well as fatigue, at that time advised her to reduce lisinopril to 10 mg once daily.  Blood pressure was initially elevated in the office today, however upon recheck it is well controlled.  In view of patient's continued episodes of hypotension although asymptomatic we will not make changes to her medications at this time.  Advised patient to continue lisinopril 10 mg once daily.  Patient's symptoms of fatigue and dizziness have significantly improved with reduction of antihypertensive medications.  She is tolerating dual antiplatelet therapy without bleeding diathesis.  We will continue current guideline directed medical therapy including aspirin, Brilinta, Crestor, lisinopril.  Overall patient is doing well from a cardiovascular standpoint.  Advised her to continue to slowly increase physical activity as tolerated.  In regard to bilateral lower leg edema, had started patient on as needed Lasix 40 mg once daily. Discussed appropriate as needed dosing of furosemide, patient verbalized understanding and agreement.   Congratulated patient and encouraged her to continue with diet and lifestyle modifications.  Patient will need repeat lipid profile testing prior to next office visit.  Follow-up in 3 months, sooner if needed, for CAD, hypertension, and hyperlipidemia.   Alethia Berthold, PA-C 06/10/2020, 3:31 PM Office: [phone]

## 2020-06-09 NOTE — Progress Notes (Signed)
Daily Session Note  Patient Details  Name: Amy Lin MRN: [id number] Date of Birth: 1933-10-14 Referring Provider:   Flowsheet Row Cardiac Rehab from 03/30/2020 in [facility]  Referring Provider Clayton Lefort MD      Encounter Date: 06/09/2020  Check In:  Session Check In - 06/09/20 1106      Check-In   Supervising physician immediately available to respond to emergencies See telemetry face sheet for immediately available ER MD    Location [facility] & Pul[facility]    Staff Present Birdie Sons, MPA, RN;Melissa Caiola RDN, Rowe Pavy, BA, ACSM CEP, Exercise Physiologist;Joseph Tessie Fass RCP,RRT,BSRT    Virtual Visit No    Medication changes reported     No    Fall or balance concerns reported    No    Warm-up and Cool-down Performed on first and last piece of equipment    Resistance Training Performed Yes    VAD Patient? No    PAD/SET Patient? No      Pain Assessment   Currently in Pain? No/denies              Social History   Tobacco Use  Smoking Status Never Smoker  Smokeless Tobacco Never Used    Goals Met:  Independence with exercise equipment Exercise tolerated well No report of cardiac concerns or symptoms Strength training completed today  Goals Unmet:  Not Applicable  Comments: Pt able to follow exercise prescription today without complaint.  Will continue to monitor for progression.    Dr. [name] Filbert is Medical Director for [facility].

## 2020-06-10 ENCOUNTER — Ambulatory Visit: Payer: PPO | Admitting: Student

## 2020-06-10 ENCOUNTER — Encounter: Payer: Self-pay | Admitting: Student

## 2020-06-10 ENCOUNTER — Other Ambulatory Visit: Payer: Self-pay

## 2020-06-10 VITALS — BP 126/70 | HR 74 | Temp 98.3°F | Resp 16 | Ht 64.0 in | Wt 151.8 lb

## 2020-06-10 DIAGNOSIS — I2510 Atherosclerotic heart disease of native coronary artery without angina pectoris: Secondary | ICD-10-CM

## 2020-06-10 DIAGNOSIS — I10 Essential (primary) hypertension: Secondary | ICD-10-CM

## 2020-06-10 DIAGNOSIS — I252 Old myocardial infarction: Secondary | ICD-10-CM | POA: Diagnosis not present

## 2020-06-10 MED ORDER — POTASSIUM CHLORIDE CRYS ER 20 MEQ PO TBCR: 20.0000 meq | EXTENDED_RELEASE_TABLET | Freq: Every day | ORAL | 3 refills | Status: DC | PRN

## 2020-06-10 MED ORDER — FUROSEMIDE 40 MG PO TABS: 40.0000 mg | ORAL_TABLET | Freq: Every day | ORAL | 3 refills | Status: DC | PRN

## 2020-06-10 NOTE — Patient Instructions (Signed)
Go to [employer] [facility] [facility] for cholesterol check before follow up visit.

## 2020-06-11 VITALS — Ht 64.2 in | Wt 151.9 lb

## 2020-06-11 DIAGNOSIS — Z955 Presence of coronary angioplasty implant and graft: Secondary | ICD-10-CM | POA: Diagnosis not present

## 2020-06-11 DIAGNOSIS — I213 ST elevation (STEMI) myocardial infarction of unspecified site: Secondary | ICD-10-CM

## 2020-06-11 NOTE — Progress Notes (Signed)
Cardiac Individual Treatment Plan  Patient Details  Name: Amy Lin MRN: [id number] Date of Birth: 12/28/1933 Referring Provider:   Flowsheet Row Cardiac Rehab from 03/30/2020 in [facility] Cardiac and Pulmonary Rehab  Referring Provider Clayton Lefort MD      Initial Encounter Date:  Flowsheet Row Cardiac Rehab from 03/30/2020 in [facility] Cardiac and Pulmonary Rehab  Date 03/30/20      Visit Diagnosis: Status post coronary artery stent placement  ST elevation myocardial infarction (STEMI), unspecified artery ([place])  Patient's Home Medications on Admission:  Current Outpatient Medications:  .  aspirin 81 MG EC tablet, Take 81 mg by mouth daily., Disp: , Rfl:  .  carvedilol (COREG) 12.5 MG tablet, Take 0.5 tablets (6.25 mg total) by mouth 2 (two) times daily., Disp: 60 tablet, Rfl: 3 .  cetirizine (ZYRTEC) 10 MG tablet, Take 10 mg by mouth daily., Disp: , Rfl:  .  Cholecalciferol (VITAMIN D3) 2000 UNITS TABS, Take 1 tablet by mouth every evening., Disp: , Rfl:  .  Cyanocobalamin (VITAMIN B 12 PO), Take 1 tablet by mouth every evening., Disp: , Rfl:  .  ezetimibe (ZETIA) 10 MG tablet, TAKE 1 TABLET BY MOUTH ONCE A DAY AFTER SUPPER, Disp: 90 tablet, Rfl: 2 .  furosemide (LASIX) 40 MG tablet, Take 1 tablet (40 mg total) by mouth daily as needed for fluid or edema., Disp: 30 tablet, Rfl: 3 .  levothyroxine (SYNTHROID) 75 MCG tablet, TAKE 1 TABLET BY MOUTH ONCE A DAY ON AN EMPTY STOMACH.TAKE WITH A GLASS OF WATERAT LEAST 30-60 MIN BEFORE BREAKFAST (Patient taking differently: Take 75 mcg by mouth daily before breakfast.), Disp: 90 tablet, Rfl: 1 .  lisinopril (ZESTRIL) 20 MG tablet, Take 0.5 tablets (10 mg total) by mouth daily., Disp: 90 tablet, Rfl: 3 .  nitroGLYCERIN (NITROSTAT) 0.4 MG SL tablet, Place 1 tablet (0.4 mg total) under the tongue every 5 (five) minutes as needed for up to 25 days for chest pain., Disp: 25 tablet, Rfl: 3 .  pantoprazole (PROTONIX) 20 MG tablet, TAKE 1 TABLET BY MOUTH  TWICE A DAY (Patient taking differently: Take 20 mg by mouth 2 (two) times daily.), Disp: 180 tablet, Rfl: 2 .  potassium chloride SA (KLOR-CON) 20 MEQ tablet, Take 1 tablet (20 mEq total) by mouth daily as needed., Disp: 30 tablet, Rfl: 3 .  rosuvastatin (CRESTOR) 20 MG tablet, Take 1 tablet (20 mg total) by mouth daily., Disp: 90 tablet, Rfl: 3 .  ticagrelor (BRILINTA) 90 MG TABS tablet, Take 1 tablet (90 mg total) by mouth 2 (two) times daily., Disp: 180 tablet, Rfl: 3  Past Medical History: Past Medical History:  Diagnosis Date  . Allergy   . Coronary artery disease   . GERD (gastroesophageal reflux disease)   . Hyperlipidemia   . Hypertension    diuretic for edema at first  . Hypothyroidism   . Myocardial infarction ([place])   . Osteoarthritis, multiple sites   . Osteoporosis     Tobacco Use: Social History   Tobacco Use  Smoking Status Never Smoker  Smokeless Tobacco Never Used    Labs: Recent Review Flowsheet Data    Labs for ITP Cardiac and Pulmonary Rehab Latest Ref Rng & Units 07/20/2017 09/13/2018 07/19/2019 09/25/2019 03/05/2020   Cholestrol 0 - 200 mg/dL 251(H) 304(H) 300(H) 194 194   LDLCALC 0 - 99 mg/dL 178(H) 237(H) 207(H) 119(H) 114(H)   LDLDIRECT mg/dL - - - - -   HDL >40 mg/dL 50.80 50.60 47(L) 53.40  51   Trlycerides <150 mg/dL 110.0 84.0 260(H) 107.0 146   Hemoglobin A1c 4.6 - 6.5 % - - - 6.3 -   TCO2 22 - 32 mmol/L - - - - 22       Exercise Target Goals: Exercise Program Goal: Individual exercise prescription set using results from initial 6 min walk test and THRR while considering  patient's activity barriers and safety.   Exercise Prescription Goal: Initial exercise prescription builds to 30-45 minutes a day of aerobic activity, 2-3 days per week.  Home exercise guidelines will be given to patient during program as part of exercise prescription that the participant will acknowledge.   Education: Aerobic Exercise: - Group verbal and visual presentation  on the components of exercise prescription. Introduces F.I.T.T principle from ACSM for exercise prescriptions.  Reviews F.I.T.T. principles of aerobic exercise including progression. Written material given at graduation. Flowsheet Row Cardiac Rehab from 06/11/2020 in [facility] Cardiac and Pulmonary Rehab  Date 05/21/20  Educator [place]  Instruction Review Code 1- Verbalizes Understanding      Education: Resistance Exercise: - Group verbal and visual presentation on the components of exercise prescription. Introduces F.I.T.T principle from ACSM for exercise prescriptions  Reviews F.I.T.T. principles of resistance exercise including progression. Written material given at graduation. Flowsheet Row Cardiac Rehab from 06/11/2020 in [facility] Cardiac and Pulmonary Rehab  Date 05/28/20  Educator [facility]  Instruction Review Code 1- Verbalizes Understanding       Education: Exercise & Equipment Safety: - Individual verbal instruction and demonstration of equipment use and safety with use of the equipment. Flowsheet Row Cardiac Rehab from 06/11/2020 in [facility] Cardiac and Pulmonary Rehab  Date 03/30/20  Educator [facility]  Instruction Review Code 1- Verbalizes Understanding      Education: Exercise Physiology & General Exercise Guidelines: - Group verbal and written instruction with models to review the exercise physiology of the cardiovascular system and associated critical values. Provides general exercise guidelines with specific guidelines to those with heart or lung disease.  Flowsheet Row Cardiac Rehab from 06/11/2020 in [facility] Cardiac and Pulmonary Rehab  Date 05/14/20  Educator [facility]  Instruction Review Code 1- Verbalizes Understanding      Education: Flexibility, Balance, Mind/Body Relaxation: - Group verbal and visual presentation with interactive activity on the components of exercise prescription. Introduces F.I.T.T principle from ACSM for exercise prescriptions. Reviews F.I.T.T. principles of flexibility and  balance exercise training including progression. Also discusses the mind body connection.  Reviews various relaxation techniques to help reduce and manage stress (i.e. Deep breathing, progressive muscle relaxation, and visualization). Balance handout provided to take home. Written material given at graduation. Flowsheet Row Cardiac Rehab from 06/11/2020 in [facility] Cardiac and Pulmonary Rehab  Date 06/04/20  Educator [facility]  Instruction Review Code 1- Verbalizes Understanding      Activity Barriers & Risk Stratification:  Activity Barriers & Cardiac Risk Stratification - 03/30/20 1150      Activity Barriers & Cardiac Risk Stratification   Activity Barriers Arthritis;Muscular Weakness;Deconditioning;Shortness of Breath;Balance Concerns   arthritis all over, big toes tend to have painful flare ups   Cardiac Risk Stratification Moderate           6 Minute Walk:  6 Minute Walk    Row Name 03/30/20 1149 06/11/20 1115       6 Minute Walk   Phase -- Discharge    Distance 1100 feet 1200 feet    Distance % Change -- 9 %    Distance Feet Change -- 100 ft  Walk Time 6 minutes 6 minutes    # of Rest Breaks 0 0    MPH 2.08 2.27    METS 1.7 1.82    RPE 11 13    Perceived Dyspnea  2 --    VO2 Peak 5.94 6.38    Symptoms Yes (comment) No    Comments fatigue at end, SOB --    Resting HR 51 bpm 65 bpm    Resting BP 118/66 112/68    Resting Oxygen Saturation  97 % --    Exercise Oxygen Saturation  during 6 min walk 96 % 95 %    Max Ex. HR 88 bpm 82 bpm    Max Ex. BP 134/72 132/64    2 Minute Post BP 124/72 --           Oxygen Initial Assessment:   Oxygen Re-Evaluation:   Oxygen Discharge (Final Oxygen Re-Evaluation):   Initial Exercise Prescription:  Initial Exercise Prescription - 03/30/20 1100      Date of Initial Exercise RX and Referring Provider   Date 03/30/20    Referring Provider Clayton Lefort MD      Treadmill   MPH 1.8    Grade 0.5    Minutes 15    METs 2.5       Recumbant Elliptical   Level 1    RPM 50    Minutes 15    METs 2      REL-XR   Level 1    Speed 50    Minutes 15    METs 2      T5 Nustep   Level 2    SPM 80    Minutes 15    METs 2      Prescription Details   Frequency (times per week) 2    Duration Progress to 30 minutes of continuous aerobic without signs/symptoms of physical distress      Intensity   THRR 40-80% of Max Heartrate 85-119    Ratings of Perceived Exertion 11-13    Perceived Dyspnea 0-4      Progression   Progression Continue to progress workloads to maintain intensity without signs/symptoms of physical distress.      Resistance Training   Training Prescription Yes    Weight 3 lb    Reps 10-15           Perform Capillary Blood Glucose checks as needed.  Exercise Prescription Changes:  Exercise Prescription Changes    Row Name 03/30/20 1100 04/06/20 1500 04/20/20 1000 05/05/20 1200 05/20/20 0700     Response to Exercise   Blood Pressure (Admit) 118/66 118/68 144/82 132/60 140/72   Blood Pressure (Exercise) 134/72 128/62 140/66 138/68 140/70   Blood Pressure (Exit) 124/72 114/58 126/64 128/60 110/62   Heart Rate (Admit) 51 bpm 57 bpm 59 bpm 68 bpm 79 bpm   Heart Rate (Exercise) 88 bpm 84 bpm 89 bpm 88 bpm 111 bpm   Heart Rate (Exit) 61 bpm 65 bpm 62 bpm 59 bpm 63 bpm   Oxygen Saturation (Admit) 97 % -- -- -- --   Oxygen Saturation (Exercise) 96 % -- -- -- --   Rating of Perceived Exertion (Exercise) 11 13 13 14 13    Perceived Dyspnea (Exercise) 2 -- -- -- --   Symptoms fatigue, SOB none none none none   Comments walk test results second full day of exercise -- -- --   Duration -- Progress to 30 minutes of  aerobic without signs/symptoms  of physical distress Continue with 30 min of aerobic exercise without signs/symptoms of physical distress. Continue with 30 min of aerobic exercise without signs/symptoms of physical distress. Continue with 30 min of aerobic exercise without signs/symptoms of  physical distress.   Intensity -- THRR unchanged THRR unchanged THRR unchanged THRR unchanged     Progression   Progression -- Continue to progress workloads to maintain intensity without signs/symptoms of physical distress. Continue to progress workloads to maintain intensity without signs/symptoms of physical distress. Continue to progress workloads to maintain intensity without signs/symptoms of physical distress. Continue to progress workloads to maintain intensity without signs/symptoms of physical distress.   Average METs -- 2.29 2.25 2.81 2.9     Resistance Training   Training Prescription -- Yes Yes Yes Yes   Weight -- 3 lb 3 lb 3 lb 3 lb   Reps -- 10-15 10-15 10-15 10-15     Interval Training   Interval Training -- No No No No     Treadmill   MPH -- 2.5 1.8 1.8 2.5   Grade -- 0.5 0.5 0.5 0.5   Minutes -- 15 15 15 15    METs -- 3.09 2.5 2.5 3.09     Recumbant Bike   Level -- -- -- 3 --   Watts -- -- -- 30 --   Minutes -- -- -- 15 --   METs -- -- -- 2.86 --     NuStep   Level -- -- -- 2 --   Minutes -- -- -- 15 --   METs -- -- -- 2.1 --     Recumbant Elliptical   Level -- 1 1 -- --   Minutes -- 15 15 -- --   METs -- 1.5 1.4 -- --     REL-XR   Level -- -- -- 1 1   Minutes -- -- -- 15 15   METs -- -- -- 3.3 --     Home Exercise Plan   Plans to continue exercise at -- -- -- Home (comment)  walk and staff videos Home (comment)  walk and staff videos   Frequency -- -- -- Add 2 additional days to program exercise sessions. Add 2 additional days to program exercise sessions.   Initial Home Exercises Provided -- -- -- 05/05/20 05/05/20   Row Name 06/02/20 1500             Response to Exercise   Blood Pressure (Admit) 124/62       Blood Pressure (Exercise) 126/70       Blood Pressure (Exit) 110/60       Heart Rate (Admit) 65 bpm       Heart Rate (Exercise) 99 bpm       Heart Rate (Exit) 67 bpm       Rating of Perceived Exertion (Exercise) 12       Symptoms  none       Duration Continue with 30 min of aerobic exercise without signs/symptoms of physical distress.       Intensity THRR unchanged               Progression   Progression Continue to progress workloads to maintain intensity without signs/symptoms of physical distress.       Average METs 3               Resistance Training   Training Prescription Yes       Weight 3 lb       Reps  10-15               Interval Training   Interval Training No               Treadmill   MPH 1.8       Grade 0.5       Minutes 15       METs 2.5               Recumbant Bike   Level 2       Watts 20       Minutes 15       METs 2.9               REL-XR   Level 1       Minutes 15       METs 3.6               Home Exercise Plan   Plans to continue exercise at Home (comment)  walk and staff videos       Frequency Add 2 additional days to program exercise sessions.       Initial Home Exercises Provided 05/05/20              Exercise Comments:  Exercise Comments    Row Name 03/31/20 1114           Exercise Comments First full day of exercise!  Patient was oriented to gym and equipment including functions, settings, policies, and procedures.  Patient's individual exercise prescription and treatment plan were reviewed.  All starting workloads were established based on the results of the 6 minute walk test done at initial orientation visit.  The plan for exercise progression was also introduced and progression will be customized based on patient's performance and goals.              Exercise Goals and Review:  Exercise Goals    Row Name 03/30/20 1157             Exercise Goals   Increase Physical Activity Yes       Intervention Provide advice, education, support and counseling about physical activity/exercise needs.;Develop an individualized exercise prescription for aerobic and resistive training based on initial evaluation findings, risk stratification, comorbidities and  participant's personal goals.       Expected Outcomes Short Term: Attend rehab on a regular basis to increase amount of physical activity.;Long Term: Add in home exercise to make exercise part of routine and to increase amount of physical activity.;Long Term: Exercising regularly at least 3-5 days a week.       Increase Strength and Stamina Yes       Intervention Provide advice, education, support and counseling about physical activity/exercise needs.;Develop an individualized exercise prescription for aerobic and resistive training based on initial evaluation findings, risk stratification, comorbidities and participant's personal goals.       Expected Outcomes Short Term: Increase workloads from initial exercise prescription for resistance, speed, and METs.;Short Term: Perform resistance training exercises routinely during rehab and add in resistance training at home;Long Term: Improve cardiorespiratory fitness, muscular endurance and strength as measured by increased METs and functional capacity (6MWT)       Able to understand and use rate of perceived exertion (RPE) scale Yes       Intervention Provide education and explanation on how to use RPE scale       Expected Outcomes Short Term: Able to use RPE daily in rehab to express subjective  intensity level;Long Term:  Able to use RPE to guide intensity level when exercising independently       Able to understand and use Dyspnea scale Yes       Intervention Provide education and explanation on how to use Dyspnea scale       Expected Outcomes Short Term: Able to use Dyspnea scale daily in rehab to express subjective sense of shortness of breath during exertion;Long Term: Able to use Dyspnea scale to guide intensity level when exercising independently       Knowledge and understanding of Target Heart Rate Range (THRR) Yes       Intervention Provide education and explanation of THRR including how the numbers were predicted and where they are located for  reference       Expected Outcomes Short Term: Able to state/look up THRR;Short Term: Able to use daily as guideline for intensity in rehab;Long Term: Able to use THRR to govern intensity when exercising independently       Able to check pulse independently Yes       Intervention Provide education and demonstration on how to check pulse in carotid and radial arteries.;Review the importance of being able to check your own pulse for safety during independent exercise       Expected Outcomes Short Term: Able to explain why pulse checking is important during independent exercise;Long Term: Able to check pulse independently and accurately       Understanding of Exercise Prescription Yes       Intervention Provide education, explanation, and written materials on patient's individual exercise prescription       Expected Outcomes Short Term: Able to explain program exercise prescription;Long Term: Able to explain home exercise prescription to exercise independently              Exercise Goals Re-Evaluation :  Exercise Goals Re-Evaluation    Row Name 03/31/20 1114 04/06/20 1516 04/20/20 1005 04/30/20 1106 05/05/20 1257     Exercise Goal Re-Evaluation   Exercise Goals Review Able to understand and use rate of perceived exertion (RPE) scale;Able to understand and use Dyspnea scale;Knowledge and understanding of Target Heart Rate Range (THRR);Understanding of Exercise Prescription Increase Physical Activity;Increase Strength and Stamina;Understanding of Exercise Prescription Increase Physical Activity;Increase Strength and Stamina Increase Physical Activity;Increase Strength and Stamina Increase Physical Activity;Increase Strength and Stamina;Able to understand and use rate of perceived exertion (RPE) scale;Knowledge and understanding of Target Heart Rate Range (THRR);Able to check pulse independently   Comments Reviewed RPE and dyspnea scales, THR and program prescription with pt today.  Pt voiced  understanding and was given a copy of goals to take home. Renai is off to a good start in rehab.  She has completed her first two full days of exercise.  We will continue to monitor her progress. Shereda is doing well with exercise.  She is at 1.8 mph and .5% grade.  Staff will encourage her to increase levels on machines. Metzi reports walking at home daily - 15 minutes in am and 15 minutes in pm (RPE-12). She thinks she shoud increase her time. EP will go over home exercise with her. Reviewed home exercise with pt today.  Pt plans to walk/use staff videos for exercise.  Reviewed THR, pulse, RPE, sign and symptoms, pulse oximetery and when to call 911 or MD.  Also discussed weather considerations and indoor options.  Pt voiced understanding.   Expected Outcomes Short: Use RPE daily to regulate intensity. Long: Follow program prescription in THR.  Short: Continue to attend rehab regularly Long: Continue to follow program prescription Short: try level 2 on XR and more grade on TM  Long: build stamina Short: move to 20 minute walk sessions  Long: build stamina Short:  monitor HR when exercising Long:  maintain exercise on her own   Row Name 05/20/20 0748 06/02/20 1527 06/04/20 1119         Exercise Goal Re-Evaluation   Exercise Goals Review Increase Physical Activity;Increase Strength and Stamina Increase Physical Activity;Increase Strength and Stamina;Understanding of Exercise Prescription Increase Physical Activity;Increase Strength and Stamina;Understanding of Exercise Prescription     Comments Craig attends consistently and owrk sin THR range.  She has increased speed to 2.5 on TM.  Staff will monitor progress. Cortlyn has been doing well in rehab.  She is up to 20 watts on the bike!  We will talk with her about increasing workloads again.  She has been holding back due to feeling more fatigued recently.  We will continue to monitor her progress. Pollye reports not exercising as much as she should at home, but  she does walk in her house due to allergies - she will walk for at least 15 minutes 2x/day. She is still feeling poorly from low BP - medication reduced and is going to see her MD about low BP next wednesday.     Expected Outcomes Short:  increase to 4 lb for strength work Long:  continue to build stamina Short: Talk to doctor about fatigue  Long; Continue to improve stamina Short: Talk to doctor about fatigue and low BP Long; Continue to improve stamina            Discharge Exercise Prescription (Final Exercise Prescription Changes):  Exercise Prescription Changes - 06/02/20 1500      Response to Exercise   Blood Pressure (Admit) 124/62    Blood Pressure (Exercise) 126/70    Blood Pressure (Exit) 110/60    Heart Rate (Admit) 65 bpm    Heart Rate (Exercise) 99 bpm    Heart Rate (Exit) 67 bpm    Rating of Perceived Exertion (Exercise) 12    Symptoms none    Duration Continue with 30 min of aerobic exercise without signs/symptoms of physical distress.    Intensity THRR unchanged      Progression   Progression Continue to progress workloads to maintain intensity without signs/symptoms of physical distress.    Average METs 3      Resistance Training   Training Prescription Yes    Weight 3 lb    Reps 10-15      Interval Training   Interval Training No      Treadmill   MPH 1.8    Grade 0.5    Minutes 15    METs 2.5      Recumbant Bike   Level 2    Watts 20    Minutes 15    METs 2.9      REL-XR   Level 1    Minutes 15    METs 3.6      Home Exercise Plan   Plans to continue exercise at Home (comment)   walk and staff videos   Frequency Add 2 additional days to program exercise sessions.    Initial Home Exercises Provided 05/05/20           Nutrition:  Target Goals: Understanding of nutrition guidelines, daily intake of sodium 1500mg , cholesterol 200mg , calories 30% from fat and 7% or less from saturated  fats, daily to have 5 or more servings of fruits and  vegetables.  Education: All About Nutrition: -Group instruction provided by verbal, written material, interactive activities, discussions, models, and posters to present general guidelines for heart healthy nutrition including fat, fiber, MyPlate, the role of sodium in heart healthy nutrition, utilization of the nutrition label, and utilization of this knowledge for meal planning. Follow up email sent as well. Written material given at graduation. Flowsheet Row Cardiac Rehab from 06/11/2020 in [facility] Cardiac and Pulmonary Rehab  Date 06/11/20  Educator [facility]  Instruction Review Code 1- Verbalizes Understanding      Biometrics:  Pre Biometrics - 03/30/20 1158      Pre Biometrics   Height 5' 4.2" (1.631 m)    Weight 154 lb 3.2 oz (69.9 kg)    BMI (Calculated) 26.29    Single Leg Stand 3.8 seconds           Post Biometrics - 06/11/20 1115       Post  Biometrics   Height 5' 4.2" (1.631 m)    Weight 151 lb 14.4 oz (68.9 kg)    BMI (Calculated) 25.9    Single Leg Stand 21.13 seconds           Nutrition Therapy Plan and Nutrition Goals:  Nutrition Therapy & Goals - 03/30/20 1132      Nutrition Therapy   Diet Heart healthy, low Na    Drug/Food Interactions Statins/Certain Fruits    Protein (specify units) 55g    Fiber 25 grams    Whole Grain Foods 3 servings    Saturated Fats 12 max. grams    Fruits and Vegetables 8 servings/day    Sodium 1.5 grams      Personal Nutrition Goals   Nutrition Goal ST: switch to whole grain bread, include one extra serving of vegetables with lunch LT: eat at least 5 servings of fruits/vegetables per day, make at least half of grain eaten whole grains.    Comments B: scrambled egg (butter), toast (white with jelly and butter), cantaloupe - 2 cups coffee (cream and sugar) L: fruit, sandwich, cheese toast (light lunch) D: vegetables, sweet potato - once per week, hamburger (1x/week), porkloin, chicken. Drinks: water. Discussed heart halthy eating and  small changes.      Intervention Plan   Intervention Prescribe, educate and counsel regarding individualized specific dietary modifications aiming towards targeted core components such as weight, hypertension, lipid management, diabetes, heart failure and other comorbidities.;Nutrition handout(s) given to patient.    Expected Outcomes Short Term Goal: Understand basic principles of dietary content, such as calories, fat, sodium, cholesterol and nutrients.;Short Term Goal: A plan has been developed with personal nutrition goals set during dietitian appointment.;Long Term Goal: Adherence to prescribed nutrition plan.           Nutrition Assessments:  MEDIFICTS Score Key:  ?70 Need to make dietary changes   40-70 Heart Healthy Diet  ? 40 Therapeutic Level Cholesterol Diet  Flowsheet Row Cardiac Rehab from 06/11/2020 in [facility] Cardiac and Pulmonary Rehab  Picture Your Plate Total Score on Discharge 75     Picture Your Plate Scores:  [id number] Unhealthy dietary pattern with much room for improvement.  41-50 Dietary pattern unlikely to meet recommendations for good health and room for improvement.  51-60 More healthful dietary pattern, with some room for improvement.   >60 Healthy dietary pattern, although there may be some specific behaviors that could be improved.    Nutrition Goals Re-Evaluation:  Nutrition  Goals Re-Evaluation    Row Name 04/30/20 1114 06/04/20 1110           Goals   Current Weight 186 lb (84.4 kg) 151 lb 1.6 oz (68.5 kg)      Nutrition Goal ST: include one extra serving of vegetables with lunch - bean salad, and roasted vegetables LT: eat at least 5 servings of fruits/vegetables per day, make at least half of grain eaten whole grains. ST: Discussed snack with peanutbutter or hummus (with fruit and vegetables respectively) - Prerna is agreeable to this. LT: eat at least 5 servings of fruits/vegetables per day, make at least half of grain eaten whole grains.       Comment She has switched to whole grain bread and reports it is ok. She reports not doing as well with vegetables with lunch - she feels preparing them is a barrier - she shops 1x/week. She has no difficulty with chopping or cutting. She has been eating some kale salad. Suggested bean salad and roasted vegetables for meal prep vegetables - Takima would like to try this. She tried adding an extra serving at lunch, but doesn't do it every day as she doesn't eat much for lunch. She has been limiting her salt and is doing well with that. She feels like she is not getting enough protein. Discussed snack with peanutbutter or hummus (with fruit and vegetables respectively) - Yolani is agreeable to this.      Expected Outcome ST: include one extra serving of vegetables with lunch - bean salad, and roasted vegetables LT: eat at least 5 servings of fruits/vegetables per day, make at least half of grain eaten whole grains. ST: Discussed snack with peanutbutter or hummus (with fruit and vegetables respectively) - Syrenity is agreeable to this. LT: eat at least 5 servings of fruits/vegetables per day, make at least half of grain eaten whole grains.             Nutrition Goals Discharge (Final Nutrition Goals Re-Evaluation):  Nutrition Goals Re-Evaluation - 06/04/20 1110      Goals   Current Weight 151 lb 1.6 oz (68.5 kg)    Nutrition Goal ST: Discussed snack with peanutbutter or hummus (with fruit and vegetables respectively) - Nabeela is agreeable to this. LT: eat at least 5 servings of fruits/vegetables per day, make at least half of grain eaten whole grains.    Comment She tried adding an extra serving at lunch, but doesn't do it every day as she doesn't eat much for lunch. She has been limiting her salt and is doing well with that. She feels like she is not getting enough protein. Discussed snack with peanutbutter or hummus (with fruit and vegetables respectively) - Dicy is agreeable to this.    Expected Outcome ST:  Discussed snack with peanutbutter or hummus (with fruit and vegetables respectively) - Arsula is agreeable to this. LT: eat at least 5 servings of fruits/vegetables per day, make at least half of grain eaten whole grains.           Psychosocial: Target Goals: Acknowledge presence or absence of significant depression and/or stress, maximize coping skills, provide positive support system. Participant is able to verbalize types and ability to use techniques and skills needed for reducing stress and depression.   Education: Stress, Anxiety, and Depression - Group verbal and visual presentation to define topics covered.  Reviews how body is impacted by stress, anxiety, and depression.  Also discusses healthy ways to reduce stress and to  treat/manage anxiety and depression.  Written material given at graduation. Flowsheet Row [facility]  Date 05/07/20  Educator [facility]  Instruction Review Code 1- [employer] Squ[employer]bVerbalizes Understanding      Education: Sleep Hygiene -Provides group verbal and written instruction about how sleep can affect your health.  Define sleep hygiene, discuss sleep cycles and impact of sleep habits. Review good sleep hygiene tips.    Initial Review & Psychosocial Screening:  Initial Psych Review & Screening - 03/20/20 1307      Initial Review   Current issues with Current Stress Concerns    Source of Stress Concerns Unable to participate in former interests or hobbies;Unable to perform yard/household activities      Family Dynamics   Good Support System? Yes   daughter     Barriers   Psychosocial barriers to participate in program There are no identifiable barriers or psychosocial needs.      Screening Interventions   Interventions Encouraged to exercise;Provide feedback about the scores to participant;To provide support and resources with identified psychosocial needs    Expected Outcomes Long Term Goal: Stressors or current  issues are controlled or eliminated.;Short Term goal: Utilizing psychosocial counselor, staff and physician to assist with identification of specific Stressors or current issues interfering with healing process. Setting desired goal for each stressor or current issue identified.;Short Term goal: Identification and review with participant of any Quality of Life or Depression concerns found by scoring the questionnaire.;Long Term goal: The participant improves quality of Life and PHQ9 Scores as seen by post scores and/or verbalization of changes           Quality of Life Scores:   Quality of Life - 06/11/20 1228      Quality of Life Scores   Health/Function Pre 19 %    Health/Function Post 22.77 %    Health/Function % Change 19.84 %    Socioeconomic Pre 24.86 %    Socioeconomic Post 26.5 %    Socioeconomic % Change  6.6 %    Psych/Spiritual Pre 23.93 %    Psych/Spiritual Post 24.86 %    Psych/Spiritual % Change 3.89 %    Family Pre 25.5 %    Family Post 25.2 %    Family % Change -1.18 %    GLOBAL Pre 22.08 %    GLOBAL Post 24.39 %    GLOBAL % Change 10.46 %          Scores of 19 and below usually indicate a poorer quality of life in these areas.  A difference of  2-3 points is a clinically meaningful difference.  A difference of 2-3 points in the total score of the Quality of Life Index has been associated with significant improvement in overall quality of life, self-image, physical symptoms, and general health in studies assessing change in quality of life.  PHQ-9: Recent Review Flowsheet Data    Dep[facility] 06/02/2020 04/28/2020 03/30/2020 09/25/2019 09/13/2018   Decreased Interest 0 1 1 0 0   Down, Depressed, Hopeless 0 0 1 0 0   PHQ - 2 Score 0 1 2 0 0   Altered sleeping 0 0 0 - 0   Tired, decreased energy 1 2 2  - 0   Change in appetite 1 0 1 - 0   Feeling bad or failure about yourself  0 0 1 - 0   Trouble concentrating 0 2 0 - 0   Moving slowly or  fidgety/restless  0 0 1 - 0   Suicidal thoughts 0 0 0 - 0   PHQ-9 Score 2 5 7  - 0   Difficult doing work/chores Somewhat difficult Somewhat difficult Somewhat difficult - Not difficult at all     Interpretation of Total Score  Total Score Depression Severity:  1-4 = Minimal depression, 5-9 = Mild depression, 10-14 = Moderate depression, 15-19 = Moderately severe depression, 20-27 = Severe depression   Psychosocial Evaluation and Intervention:  Psychosocial Evaluation - 03/20/20 1331      Psychosocial Evaluation & Interventions   Interventions Encouraged to exercise with the program and follow exercise prescription    Comments Ms. Kubek states she is slowly feeling better after her MI. She still feels very tired at times and think it may be her body adjusting to the medications. She has started walking some around the house and is looking forward to becoming more active in Cardiac Rehab. Her daughter is very supportive and wants to be involved in her heart healthy lifestyle. Ms. Fonville doesn't report any new stress and her current stress level is "nothing she can't handle." She hopes to boost her stamina and remain independent as long as possible.    Expected Outcomes Short: attend cardiac rehab for education and exercise. Long: develop positive self care habits.    Continue Psychosocial Services  Follow up required by staff           Psychosocial Re-Evaluation:  Psychosocial Re-Evaluation    [place] Name 04/28/20 1124 04/30/20 1118 06/02/20 1148 06/04/20 1119       Psychosocial Re-Evaluation   Current issues with Current Stress Concerns Current Stress Concerns None Identified Current Stress Concerns    Comments PHQ follow up: scores improved from a 7 to 5. States she feels better since coming here to exercise. She is concerned about her grandson - he is working for [employer] instead of his "great job before" - he wants to own his own store, he has gotten up to Health and safety inspector. He is now in [place] and she  does not like that. She has a good support system in her son and daughter. she reports no problems with sleep and even feels she is sleepier than usual. She likes to sit outside and in the window near the sun to reduce stress.  She would like to mow her own lawn on the riding mower as it relaxes her and gives her something to do, but her family won't let her. Reviewed patient health questionnaire (PHQ-9) with patient for follow up. Previously, patients score indicated signs/symptoms of depression.  Reviewed to see if patient is improving symptom wise while in program.  Score improved and patient states that it is because they have she has a good support system such as church, socializing and her grandkids. She reports only some stress getting ready for a trip with her daughter - but otherwise has no current stressors. She has a good support system with church and family - she likes seeing her grandchildren. She reports sleeping well.    Expected Outcomes to continue to improve throughout rehab program as evidenced by improved scores and continued feeling of improved mood/health ST: continue stress reducing activities and attending rehab LT: continue with positive outlook Short: Continue to attend [facility] regularly for regular exercise and social engagement. Long: Continue to improve symptoms and manage a positive mental state. Short: Continue to attend [facility] regularly for regular exercise and social engagement. Long: Continue to improve symptoms and manage  a positive mental state.    Interventions Stress management education;Encouraged to attend Cardiac Rehabilitation for the exercise Stress management education;Encouraged to attend Cardiac Rehabilitation for the exercise Encouraged to attend Cardiac Rehabilitation for the exercise Encouraged to attend Cardiac Rehabilitation for the exercise    Continue Psychosocial Services  Follow up required by staff Follow up required by staff No Follow up required No  Follow up required         Initial Review   Source of Stress Concerns -- Unable to participate in former interests or hobbies;Unable to perform yard/household activities;Family -- Family           Psychosocial Discharge (Final Psychosocial Re-Evaluation):  Psychosocial Re-Evaluation - 06/04/20 1119      Psychosocial Re-Evaluation   Current issues with Current Stress Concerns    Comments She reports only some stress getting ready for a trip with her daughter - but otherwise has no current stressors. She has a good support system with church and family - she likes seeing her grandchildren. She reports sleeping well.    Expected Outcomes Short: Continue to attend [facility] regularly for regular exercise and social engagement. Long: Continue to improve symptoms and manage a positive mental state.    Interventions Encouraged to attend Cardiac Rehabilitation for the exercise    Continue Psychosocial Services  No Follow up required      Initial Review   Source of Stress Concerns Family           Vocational Rehabilitation: Provide vocational rehab assistance to qualifying candidates.   Vocational Rehab Evaluation & Intervention:  Vocational Rehab - 03/20/20 1307      Initial Vocational Rehab Evaluation & Intervention   Assessment shows need for Vocational Rehabilitation No           Education: Education Goals: Education classes will be provided on a variety of topics geared toward better understanding of heart health and risk factor modification. Participant will state understanding/return demonstration of topics presented as noted by education test scores.  Learning Barriers/Preferences:  Learning Barriers/Preferences - 03/20/20 1307      Learning Barriers/Preferences   Learning Barriers None    Learning Preferences None           General Cardiac Education Topics:  AED/CPR: - Group verbal and written instruction with the use of models to demonstrate the basic use of  the AED with the basic ABC's of resuscitation.   Anatomy and Cardiac Procedures: - Group verbal and visual presentation and models provide information about basic cardiac anatomy and function. Reviews the testing methods done to diagnose heart disease and the outcomes of the test results. Describes the treatment choices: Medical Management, Angioplasty, or Coronary Bypass Surgery for treating various heart conditions including Myocardial Infarction, Angina, Valve Disease, and Cardiac Arrhythmias.  Written material given at graduation. Flowsheet Row Cardiac Rehab from 06/11/2020 in [facility] Cardiac and Pulmonary Rehab  Date 05/28/20  Educator [facility]  Instruction Review Code 1- Verbalizes Understanding      Medication Safety: - Group verbal and visual instruction to review commonly prescribed medications for heart and lung disease. Reviews the medication, class of the drug, and side effects. Includes the steps to properly store meds and maintain the prescription regimen.  Written material given at graduation. Flowsheet Row Cardiac Rehab from 06/11/2020 in [facility] Cardiac and Pulmonary Rehab  Date 04/16/20  Educator SB  Instruction Review Code 1- Verbalizes Understanding      Intimacy: - Group verbal instruction through game format to discuss  how heart and lung disease can affect sexual intimacy. Written material given at graduation.. Flowsheet Row Cardiac Rehab from 06/11/2020 in [facility] Cardiac and Pulmonary Rehab  Date 05/21/20  Educator [place]  Instruction Review Code 1- Verbalizes Understanding      Know Your Numbers and Heart Failure: - Group verbal and visual instruction to discuss disease risk factors for cardiac and pulmonary disease and treatment options.  Reviews associated critical values for Overweight/Obesity, Hypertension, Cholesterol, and Diabetes.  Discusses basics of heart failure: signs/symptoms and treatments.  Introduces Heart Failure Zone chart for action plan for heart failure.  Written  material given at graduation. Flowsheet Row Cardiac Rehab from 06/11/2020 in [facility] Cardiac and Pulmonary Rehab  Date 04/23/20  Educator [facility]  Instruction Review Code 1- Verbalizes Understanding      Infection Prevention: - Provides verbal and written material to individual with discussion of infection control including proper hand washing and proper equipment cleaning during exercise session. Flowsheet Row Cardiac Rehab from 06/11/2020 in [facility] Cardiac and Pulmonary Rehab  Date 03/30/20  Educator [facility]  Instruction Review Code 1- Verbalizes Understanding      Falls Prevention: - Provides verbal and written material to individual with discussion of falls prevention and safety. Flowsheet Row Cardiac Rehab from 06/11/2020 in [facility] Cardiac and Pulmonary Rehab  Date 03/30/20  Educator [facility]  Instruction Review Code 1- Verbalizes Understanding      Other: -Provides group and verbal instruction on various topics (see comments)   Knowledge Questionnaire Score:  Knowledge Questionnaire Score - 06/11/20 1227      Knowledge Questionnaire Score   Pre Score 26/26    Post Score 25/26           Core Components/Risk Factors/Patient Goals at Admission:  Personal Goals and Risk Factors at Admission - 03/30/20 1219      Core Components/Risk Factors/Patient Goals on Admission    Weight Management Yes;Weight Loss    Intervention Weight Management: Develop a combined nutrition and exercise program designed to reach desired caloric intake, while maintaining appropriate intake of nutrient and fiber, sodium and fats, and appropriate energy expenditure required for the weight goal.;Weight Management: Provide education and appropriate resources to help participant work on and attain dietary goals.;Weight Management/Obesity: Establish reasonable short term and long term weight goals.;Obesity: Provide education and appropriate resources to help participant work on and attain dietary goals.    Admit Weight 154 lb  3.2 oz (69.9 kg)    Goal Weight: Short Term 150 lb (68 kg)    Goal Weight: Long Term 150 lb (68 kg)    Expected Outcomes Short Term: Continue to assess and modify interventions until short term weight is achieved;Long Term: Adherence to nutrition and physical activity/exercise program aimed toward attainment of established weight goal;Weight Loss: Understanding of general recommendations for a balanced deficit meal plan, which promotes 1-2 lb weight loss per week and includes a negative energy balance of [id number] kcal/d;Understanding recommendations for meals to include 15-35% energy as protein, 25-35% energy from fat, 35-60% energy from carbohydrates, less than 200mg  of dietary cholesterol, 20-35 gm of total fiber daily;Understanding of distribution of calorie intake throughout the day with the consumption of 4-5 meals/snacks    Hypertension Yes    Intervention Provide education on lifestyle modifcations including regular physical activity/exercise, weight management, moderate sodium restriction and increased consumption of fresh fruit, vegetables, and low fat dairy, alcohol moderation, and smoking cessation.;Monitor prescription use compliance.    Expected Outcomes Short Term: Continued assessment and intervention until BP  is < 140/2mm HG in hypertensive participants. < 130/90mm HG in hypertensive participants with diabetes, heart failure or chronic kidney disease.;Long Term: Maintenance of blood pressure at goal levels.    Lipids Yes    Intervention Provide education and support for participant on nutrition & aerobic/resistive exercise along with prescribed medications to achieve LDL 70mg , HDL >40mg .    Expected Outcomes Short Term: Participant states understanding of desired cholesterol values and is compliant with medications prescribed. Participant is following exercise prescription and nutrition guidelines.;Long Term: Cholesterol controlled with medications as prescribed, with individualized  exercise RX and with personalized nutrition plan. Value goals: LDL < 70mg , HDL > 40 mg.           Education:Diabetes - Individual verbal and written instruction to review signs/symptoms of diabetes, desired ranges of glucose level fasting, after meals and with exercise. Acknowledge that pre and post exercise glucose checks will be done for 3 sessions at entry of program.   Core Components/Risk Factors/Patient Goals Review:   Goals and Risk Factor Review    Row Name 04/30/20 1106 06/02/20 1141 06/04/20 1117         Core Components/Risk Factors/Patient Goals Review   Personal Goals Review Improve shortness of breath with ADL's;Hypertension;Lipids -- Improve shortness of breath with ADL's;Hypertension;Lipids     Review She reports having some shortness of breath with ADLs which has not been improving much since starting rehab. She has limited energy - she thinks it may be her medication (she has recently talked to her doctor about and her MD changed one of her medications. She has to go to the bathroom frequently - 4-5x/day, 3 times this morning before rehab. Her BP this morning was 132/60. She takes her BP at home and sometimes her BP 109/50 at home. Discussed drinking water when BP gets moderately low. She continues to exercise and practice healthy eating habits to help with HLD. Syeeda reports that she is feeling more short of breath than usual right now and her BP will sometimes read very low - [id number] systolic. Encouraged her to talk with her doctor regarding this. Her BP coming in today was 112/68. Murrel reports that she is feeling more short of breath than usual right now and her BP will sometimes read very low - [id number] systolic. Encouraged her to talk with her doctor regarding this. Her BP coming in today was 112/68. She has since spoke to her doctor who lowered her BP medication to 1/2 dose - will see her MD Wednesday. She reports her shortness of breath has been worse and thinks that has to do with  her low BP. She continues to work on her nutrition.     Expected Outcomes ST: see if medication change helps with energy LT: continue to monitor risk factors -- ST: see if medication change helps with energy/ BP, see MD  LT: continue to monitor risk factors            Core Components/Risk Factors/Patient Goals at Discharge (Final Review):   Goals and Risk Factor Review - 06/04/20 1117      Core Components/Risk Factors/Patient Goals Review   Personal Goals Review Improve shortness of breath with ADL's;Hypertension;Lipids    Review Zyonna reports that she is feeling more short of breath than usual right now and her BP will sometimes read very low - [id number] systolic. Encouraged her to talk with her doctor regarding this. Her BP coming in today was 112/68. She has since spoke to her doctor who lowered  her BP medication to 1/2 dose - will see her MD Wednesday. She reports her shortness of breath has been worse and thinks that has to do with her low BP. She continues to work on her nutrition.    Expected Outcomes ST: see if medication change helps with energy/ BP, see MD  LT: continue to monitor risk factors           ITP Comments:  ITP Comments    Row Name 03/20/20 1322 03/30/20 1148 03/31/20 1114 04/22/20 0957 05/20/20 [id number]   ITP Comments Initial telephone orientation completed. Diagnosis can be found in CHL 2/3. EP orientation scheduled for 2/28 at 10am. Completed 6MWT and gym orientation. Initial ITP created and sent for review to Dr. [name] Filbert, Medical Director. First full day of exercise!  Patient was oriented to gym and equipment including functions, settings, policies, and procedures.  Patient's individual exercise prescription and treatment plan were reviewed.  All starting workloads were established based on the results of the 6 minute walk test done at initial orientation visit.  The plan for exercise progression was also introduced and progression will be customized based on patient's  performance and goals. 30 Day review completed. Medical Director ITP review done, changes made as directed, and signed approval by Medical Director.  New to program 30 Day review completed. Medical Director ITP review done, changes made as directed, and signed approval by Medical Director.   [place] Name 06/02/20 1139 06/11/20 1114         ITP Comments Elrose reports that she is feeling more short of breath than usual right now and her BP will sometimes read very low - [id number] systolic. Encouraged her to talk with her doctor regarding this. Her BP coming in today was 112/68. Melaney graduated today from  rehab with 33 sessions completed.  Details of the patient's exercise prescription and what She needs to do in order to continue the prescription and progress were discussed with patient.  Patient was given a copy of prescription and goals.  Patient verbalized understanding.  Mileidy plans to continue to exercise by walking at home and using staff videos.             Comments: discharge ITP

## 2020-06-11 NOTE — Progress Notes (Signed)
Discharge Progress Report  Patient Details  Name: Amy Lin MRN: [id number] Date of Birth: 10-12-1933 Referring Provider:   Flowsheet Row Cardiac Rehab from 03/30/2020 in [facility]  Referring Provider Clayton Lefort MD       Number of Visits: 33  Reason for Discharge:  Patient reached a stable level of exercise. Patient independent in their exercise. Patient has met program and personal goals.  Smoking History:  Social History   Tobacco Use  Smoking Status Never Smoker  Smokeless Tobacco Never Used    Diagnosis:  Status post coronary artery stent placement  ST elevation myocardial infarction (STEMI), unspecified artery ([facility])  ADL UCSD:   Initial Exercise Prescription:  Initial Exercise Prescription - 03/30/20 1100      Date of Initial Exercise RX and Referring Provider   Date 03/30/20    Referring Provider Clayton Lefort MD      Treadmill   MPH 1.8    Grade 0.5    Minutes 15    METs 2.5      Recumbant Elliptical   Level 1    RPM 50    Minutes 15    METs 2      REL-XR   Level 1    Speed 50    Minutes 15    METs 2      T5 Nustep   Level 2    SPM 80    Minutes 15    METs 2      Prescription Details   Frequency (times per week) 2    Duration Progress to 30 minutes of continuous aerobic without signs/symptoms of physical distress      Intensity   THRR 40-80% of Max Heartrate 85-119    Ratings of Perceived Exertion 11-13    Perceived Dyspnea 0-4      Progression   Progression Continue to progress workloads to maintain intensity without signs/symptoms of physical distress.      Resistance Training   Training Prescription Yes    Weight 3 lb    Reps 10-15           Discharge Exercise Prescription (Final Exercise Prescription Changes):  Exercise Prescription Changes - 06/02/20 1500      Response to Exercise   Blood Pressure (Admit) 124/62    Blood Pressure (Exercise) 126/70    Blood Pressure (Exit) 110/60    Heart  Rate (Admit) 65 bpm    Heart Rate (Exercise) 99 bpm    Heart Rate (Exit) 67 bpm    Rating of Perceived Exertion (Exercise) 12    Symptoms none    Duration Continue with 30 min of aerobic exercise without signs/symptoms of physical distress.    Intensity THRR unchanged      Progression   Progression Continue to progress workloads to maintain intensity without signs/symptoms of physical distress.    Average METs 3      Resistance Training   Training Prescription Yes    Weight 3 lb    Reps 10-15      Interval Training   Interval Training No      Treadmill   MPH 1.8    Grade 0.5    Minutes 15    METs 2.5      Recumbant Bike   Level 2    Watts 20    Minutes 15    METs 2.9      REL-XR   Level 1    Minutes 15  METs 3.6      Home Exercise Plan   Plans to continue exercise at Home (comment)   walk and staff videos   Frequency Add 2 additional days to program exercise sessions.    Initial Home Exercises Provided 05/05/20           Functional Capacity:  6 Minute Walk    Row Name 03/30/20 1149 06/11/20 1115       6 Minute Walk   Phase -- Discharge    Distance 1100 feet 1200 feet    Distance % Change -- 9 %    Distance Feet Change -- 100 ft    Walk Time 6 minutes 6 minutes    # of Rest Breaks 0 0    MPH 2.08 2.27    METS 1.7 1.82    RPE 11 13    Perceived Dyspnea  2 --    VO2 Peak 5.94 6.38    Symptoms Yes (comment) No    Comments fatigue at end, SOB --    Resting HR 51 bpm 65 bpm    Resting BP 118/66 112/68    Resting Oxygen Saturation  97 % --    Exercise Oxygen Saturation  during 6 min walk 96 % 95 %    Max Ex. HR 88 bpm 82 bpm    Max Ex. BP 134/72 132/64    2 Minute Post BP 124/72 --           Psychological, QOL, Others - Outcomes: PHQ 2/9: Depression screen [facility] 2/9 06/02/2020 04/28/2020 03/30/2020 09/25/2019 09/13/2018  Decreased Interest 0 1 1 0 0  Down, Depressed, Hopeless 0 0 1 0 0  PHQ - 2 Score 0 1 2 0 0  Altered sleeping 0 0 0 - 0  Tired,  decreased energy _0 - 0  Change in appetite 1 0 1 - 0  Feeling bad or failure about yourself  0 0 1 - 0  Trouble concentrating 0 2 0 - 0  Moving slowly or fidgety/restless 0 0 1 - 0  Suicidal thoughts 0 0 0 - 0  PHQ-9 Score _1 - 0  Difficult doing work/chores Somewhat difficult Somewhat difficult Somewhat difficult - Not difficult at all  Some recent data might be hidden    Quality of Life:  Quality of Life - 06/11/20 1228      Quality of Life Scores   Health/Function Pre 19 %    Health/Function Post 22.77 %    Health/Function % Change 19.84 %    Socioeconomic Pre 24.86 %    Socioeconomic Post 26.5 %    Socioeconomic % Change  6.6 %    Psych/Spiritual Pre 23.93 %    Psych/Spiritual Post 24.86 %    Psych/Spiritual % Change 3.89 %    Family Pre 25.5 %    Family Post 25.2 %    Family % Change -1.18 %    GLOBAL Pre 22.08 %    GLOBAL Post 24.39 %    GLOBAL % Change 10.46 %           Nutrition & Weight - Outcomes:  Pre Biometrics - 03/30/20 1158      Pre Biometrics   Height 5' 4.2" (1.631 m)    Weight 154 lb 3.2 oz (69.9 kg)    BMI (Calculated) 26.29    Single Leg Stand 3.8 seconds           Post Biometrics - 06/11/20 1115  Post  Biometrics   Height 5' 4.2" (1.631 m)    Weight 151 lb 14.4 oz (68.9 kg)    BMI (Calculated) 25.9    Single Leg Stand 21.13 seconds           Nutrition:  Nutrition Therapy & Goals - 03/30/20 1132      Nutrition Therapy   Diet Heart healthy, low Na    Drug/Food Interactions Statins/Certain Fruits    Protein (specify units) 55g    Fiber 25 grams    Whole Grain Foods 3 servings    Saturated Fats 12 max. grams    Fruits and Vegetables 8 servings/day    Sodium 1.5 grams      Personal Nutrition Goals   Nutrition Goal ST: switch to whole grain bread, include one extra serving of vegetables with lunch LT: eat at least 5 servings of fruits/vegetables per day, make at least half of grain eaten whole grains.    Comments B:  scrambled egg (butter), toast (white with jelly and butter), cantaloupe - 2 cups coffee (cream and sugar) L: fruit, sandwich, cheese toast (light lunch) D: vegetables, sweet potato - once per week, hamburger (1x/week), porkloin, chicken. Drinks: water. Discussed heart halthy eating and small changes.      Intervention Plan   Intervention Prescribe, educate and counsel regarding individualized specific dietary modifications aiming towards targeted core components such as weight, hypertension, lipid management, diabetes, heart failure and other comorbidities.;Nutrition handout(s) given to patient.    Expected Outcomes Short Term Goal: Understand basic principles of dietary content, such as calories, fat, sodium, cholesterol and nutrients.;Short Term Goal: A plan has been developed with personal nutrition goals set during dietitian appointment.;Long Term Goal: Adherence to prescribed nutrition plan.           Nutrition Discharge:   Education Questionnaire Score:  Knowledge Questionnaire Score - 06/11/20 1227      Knowledge Questionnaire Score   Pre Score 26/26    Post Score 25/26           Goals reviewed with patient; copy given to patient.

## 2020-06-11 NOTE — Patient Instructions (Signed)
Discharge Patient Instructions  Patient Details  Name: Amy Lin MRN: [id number] Date of Birth: Jul 15, 1933 Referring Provider:  Adrian Prows, MD   Number of Visits: 4  Reason for Discharge:  Patient reached a stable level of exercise. Patient independent in their exercise. Patient has met program and personal goals.  Smoking History:  Social History   Tobacco Use  Smoking Status Never Smoker  Smokeless Tobacco Never Used    Diagnosis:  Status post coronary artery stent placement  ST elevation myocardial infarction (STEMI), unspecified artery ([facility])  Initial Exercise Prescription:  Initial Exercise Prescription - 03/30/20 1100      Date of Initial Exercise RX and Referring Provider   Date 03/30/20    Referring Provider Clayton Lefort MD      Treadmill   MPH 1.8    Grade 0.5    Minutes 15    METs 2.5      Recumbant Elliptical   Level 1    RPM 50    Minutes 15    METs 2      REL-XR   Level 1    Speed 50    Minutes 15    METs 2      T5 Nustep   Level 2    SPM 80    Minutes 15    METs 2      Prescription Details   Frequency (times per week) 2    Duration Progress to 30 minutes of continuous aerobic without signs/symptoms of physical distress      Intensity   THRR 40-80% of Max Heartrate 85-119    Ratings of Perceived Exertion 11-13    Perceived Dyspnea 0-4      Progression   Progression Continue to progress workloads to maintain intensity without signs/symptoms of physical distress.      Resistance Training   Training Prescription Yes    Weight 3 lb    Reps 10-15           Discharge Exercise Prescription (Final Exercise Prescription Changes):  Exercise Prescription Changes - 06/02/20 1500      Response to Exercise   Blood Pressure (Admit) 124/62    Blood Pressure (Exercise) 126/70    Blood Pressure (Exit) 110/60    Heart Rate (Admit) 65 bpm    Heart Rate (Exercise) 99 bpm    Heart Rate (Exit) 67 bpm    Rating of Perceived Exertion  (Exercise) 12    Symptoms none    Duration Continue with 30 min of aerobic exercise without signs/symptoms of physical distress.    Intensity THRR unchanged      Progression   Progression Continue to progress workloads to maintain intensity without signs/symptoms of physical distress.    Average METs 3      Resistance Training   Training Prescription Yes    Weight 3 lb    Reps 10-15      Interval Training   Interval Training No      Treadmill   MPH 1.8    Grade 0.5    Minutes 15    METs 2.5      Recumbant Bike   Level 2    Watts 20    Minutes 15    METs 2.9      REL-XR   Level 1    Minutes 15    METs 3.6      Home Exercise Plan   Plans to continue exercise at Home (comment)   walk and  staff videos   Frequency Add 2 additional days to program exercise sessions.    Initial Home Exercises Provided 05/05/20           Functional Capacity:  6 Minute Walk    Row Name 03/30/20 1149 06/11/20 1115       6 Minute Walk   Phase -- Discharge    Distance 1100 feet 1200 feet    Distance % Change -- 9 %    Distance Feet Change -- 100 ft    Walk Time 6 minutes 6 minutes    # of Rest Breaks 0 0    MPH 2.08 2.27    METS 1.7 1.82    RPE 11 13    Perceived Dyspnea  2 --    VO2 Peak 5.94 6.38    Symptoms Yes (comment) No    Comments fatigue at end, SOB --    Resting HR 51 bpm 65 bpm    Resting BP 118/66 112/68    Resting Oxygen Saturation  97 % --    Exercise Oxygen Saturation  during 6 min walk 96 % 95 %    Max Ex. HR 88 bpm 82 bpm    Max Ex. BP 134/72 132/64    2 Minute Post BP 124/72 --            Nutrition & Weight - Outcomes:  Pre Biometrics - 03/30/20 1158      Pre Biometrics   Height 5' 4.2" (1.631 m)    Weight 154 lb 3.2 oz (69.9 kg)    BMI (Calculated) 26.29    Single Leg Stand 3.8 seconds           Post Biometrics - 06/11/20 1115       Post  Biometrics   Height 5' 4.2" (1.631 m)    Weight 151 lb 14.4 oz (68.9 kg)    BMI (Calculated) 25.9     Single Leg Stand 21.13 seconds           Nutrition:  Nutrition Therapy & Goals - 03/30/20 1132      Nutrition Therapy   Diet Heart healthy, low Na    Drug/Food Interactions Statins/Certain Fruits    Protein (specify units) 55g    Fiber 25 grams    Whole Grain Foods 3 servings    Saturated Fats 12 max. grams    Fruits and Vegetables 8 servings/day    Sodium 1.5 grams      Personal Nutrition Goals   Nutrition Goal ST: switch to whole grain bread, include one extra serving of vegetables with lunch LT: eat at least 5 servings of fruits/vegetables per day, make at least half of grain eaten whole grains.    Comments B: scrambled egg (butter), toast (white with jelly and butter), cantaloupe - 2 cups coffee (cream and sugar) L: fruit, sandwich, cheese toast (light lunch) D: vegetables, sweet potato - once per week, hamburger (1x/week), porkloin, chicken. Drinks: water. Discussed heart halthy eating and small changes.      Intervention Plan   Intervention Prescribe, educate and counsel regarding individualized specific dietary modifications aiming towards targeted core components such as weight, hypertension, lipid management, diabetes, heart failure and other comorbidities.;Nutrition handout(s) given to patient.    Expected Outcomes Short Term Goal: Understand basic principles of dietary content, such as calories, fat, sodium, cholesterol and nutrients.;Short Term Goal: A plan has been developed with personal nutrition goals set during dietitian appointment.;Long Term Goal: Adherence to prescribed nutrition plan.  Nutrition Discharge:   Education Questionnaire Score:  Knowledge Questionnaire Score - 03/30/20 1217      Knowledge Questionnaire Score   Pre Score 26/26           Goals reviewed with patient; copy given to patient.

## 2020-06-11 NOTE — Progress Notes (Signed)
Daily Session Note  Patient Details  Name: Amy Lin MRN: [id number] Date of Birth: 1933-03-07 Referring Provider:   Flowsheet Row Cardiac Rehab from 03/30/2020 in [facility] Cardiac and Pulmonary Rehab  Referring Provider Clayton Lefort MD      Encounter Date: 06/11/2020  Check In:  Session Check In - 06/11/20 1113      Check-In   Supervising physician immediately available to respond to emergencies See telemetry face sheet for immediately available ER MD    Location [facility]-Cardiac & Pulmonary Rehab    Staff Present Birdie Sons, MPA, Elveria Rising, BA, ACSM CEP, Exercise Physiologist;Joseph Darrin Nipper, [place], RCEP, CCRP, CCET    Virtual Visit No    Medication changes reported     No    Fall or balance concerns reported    No    Warm-up and Cool-down Performed on first and last piece of equipment    Resistance Training Performed Yes    VAD Patient? No    PAD/SET Patient? No      Pain Assessment   Currently in Pain? No/denies              Social History   Tobacco Use  Smoking Status Never Smoker  Smokeless Tobacco Never Used    Goals Met:  Independence with exercise equipment Exercise tolerated well No report of cardiac concerns or symptoms Strength training completed today  Goals Unmet:  Not Applicable  Comments:  Raegyn graduated today from  rehab with 33 sessions completed.  Details of the patient's exercise prescription and what She needs to do in order to continue the prescription and progress were discussed with patient.  Patient was given a copy of prescription and goals.  Patient verbalized understanding.  Ocean plans to continue to exercise by walking at home and using staff videos.    Dr. [name] Filbert is Medical Director for [facility].

## 2020-06-15 ENCOUNTER — Telehealth: Payer: Self-pay

## 2020-06-15 DIAGNOSIS — I2510 Atherosclerotic heart disease of native coronary artery without angina pectoris: Secondary | ICD-10-CM

## 2020-06-15 DIAGNOSIS — I10 Essential (primary) hypertension: Secondary | ICD-10-CM

## 2020-06-15 DIAGNOSIS — I25110 Atherosclerotic heart disease of native coronary artery with unstable angina pectoris: Secondary | ICD-10-CM

## 2020-06-16 NOTE — Telephone Encounter (Signed)
Left voicemail for patient to call office to discuss.

## 2020-06-17 MED ORDER — FUROSEMIDE 40 MG PO TABS: 20.0000 mg | ORAL_TABLET | Freq: Every day | ORAL | 3 refills | Status: DC | PRN

## 2020-06-17 MED ORDER — CARVEDILOL 3.125 MG PO TABS: 3.1250 mg | ORAL_TABLET | Freq: Two times a day (BID) | ORAL | 3 refills | Status: DC

## 2020-06-17 MED ORDER — LISINOPRIL 5 MG PO TABS: 5.0000 mg | ORAL_TABLET | Freq: Every day | ORAL | 3 refills | Status: DC

## 2020-06-17 NOTE — Telephone Encounter (Signed)
Patient called the office to report she has been holding carvedilol and lisinopril due to symptomatic hypotension with blood pressure readings today 113/64 mmHg without taking carvedilol or lisinopril.  She does state since holding medications symptoms of dizziness and fatigue have improved.  Advised patient to reduce lisinopril to 5 mg once daily and carvedilol to 3.125 mg twice daily.  Patient will continue to monitor symptoms and blood pressure and notify our office if she continues to have issues.

## 2020-07-08 DIAGNOSIS — I2510 Atherosclerotic heart disease of native coronary artery without angina pectoris: Secondary | ICD-10-CM | POA: Diagnosis not present

## 2020-07-09 LAB — LIPID PANEL WITH LDL/HDL RATIO
Cholesterol, Total: 148 mg/dL (ref 100–199)
HDL: 51 mg/dL (ref >39)
LDL Chol Calc (NIH): 74 mg/dL (ref 0–99)
LDL/HDL Ratio: 1.5 ratio (ref 0.0–3.2)
Triglycerides: 128 mg/dL (ref 0–149)
VLDL Cholesterol Cal: 23 mg/dL (ref 5–40)

## 2020-07-13 DIAGNOSIS — H00014 Hordeolum externum left upper eyelid: Secondary | ICD-10-CM | POA: Diagnosis not present

## 2020-07-27 ENCOUNTER — Ambulatory Visit: Payer: PPO | Admitting: Student

## 2020-08-06 DIAGNOSIS — Z961 Presence of intraocular lens: Secondary | ICD-10-CM | POA: Diagnosis not present

## 2020-08-06 DIAGNOSIS — H524 Presbyopia: Secondary | ICD-10-CM | POA: Diagnosis not present

## 2020-08-12 DIAGNOSIS — M545 Low back pain, unspecified: Secondary | ICD-10-CM | POA: Diagnosis not present

## 2020-08-12 DIAGNOSIS — M25561 Pain in right knee: Secondary | ICD-10-CM | POA: Diagnosis not present

## 2020-08-12 DIAGNOSIS — M25551 Pain in right hip: Secondary | ICD-10-CM | POA: Diagnosis not present

## 2020-08-17 ENCOUNTER — Other Ambulatory Visit: Payer: Self-pay | Admitting: Internal Medicine

## 2020-08-17 DIAGNOSIS — E039 Hypothyroidism, unspecified: Secondary | ICD-10-CM

## 2020-08-28 ENCOUNTER — Ambulatory Visit (INDEPENDENT_AMBULATORY_CARE_PROVIDER_SITE_OTHER): Payer: PPO | Admitting: Nurse Practitioner

## 2020-08-28 ENCOUNTER — Other Ambulatory Visit: Payer: Self-pay

## 2020-08-28 ENCOUNTER — Inpatient Hospital Stay: Admission: RE | Admit: 2020-08-28 | Payer: PPO | Source: Home / Self Care | Admitting: *Deleted

## 2020-08-28 ENCOUNTER — Encounter: Payer: Self-pay | Admitting: Nurse Practitioner

## 2020-08-28 ENCOUNTER — Ambulatory Visit
Admission: RE | Admit: 2020-08-28 | Discharge: 2020-08-28 | Disposition: A | Discharge status: Home / Self Care | Payer: PPO | Source: Ambulatory Visit | Attending: Nurse Practitioner | Admitting: Nurse Practitioner

## 2020-08-28 ENCOUNTER — Ambulatory Visit
Admission: RE | Admit: 2020-08-28 | Discharge: 2020-08-28 | Disposition: A | Discharge status: Home / Self Care | Payer: PPO | Attending: Nurse Practitioner | Admitting: Nurse Practitioner

## 2020-08-28 VITALS — BP 102/64 | HR 72 | Temp 98.4°F | Resp 20 | Ht 63.5 in | Wt 146.0 lb

## 2020-08-28 DIAGNOSIS — E039 Hypothyroidism, unspecified: Secondary | ICD-10-CM | POA: Diagnosis not present

## 2020-08-28 DIAGNOSIS — R0609 Other forms of dyspnea: Secondary | ICD-10-CM | POA: Insufficient documentation

## 2020-08-28 DIAGNOSIS — R0600 Dyspnea, unspecified: Secondary | ICD-10-CM | POA: Insufficient documentation

## 2020-08-28 DIAGNOSIS — I10 Essential (primary) hypertension: Secondary | ICD-10-CM | POA: Diagnosis not present

## 2020-08-28 DIAGNOSIS — R0602 Shortness of breath: Secondary | ICD-10-CM | POA: Diagnosis not present

## 2020-08-28 DIAGNOSIS — R0982 Postnasal drip: Secondary | ICD-10-CM | POA: Insufficient documentation

## 2020-08-28 DIAGNOSIS — J3489 Other specified disorders of nose and nasal sinuses: Secondary | ICD-10-CM | POA: Diagnosis not present

## 2020-08-28 LAB — COMPREHENSIVE METABOLIC PANEL
ALT: 11 U/L (ref 0–35)
AST: 14 U/L (ref 0–37)
Albumin: 3.6 g/dL (ref 3.5–5.2)
Alkaline Phosphatase: 54 U/L (ref 39–117)
BUN: 20 mg/dL (ref 6–23)
CO2: 26 mEq/L (ref 19–32)
Calcium: 9.2 mg/dL (ref 8.4–10.5)
Chloride: 103 mEq/L (ref 96–112)
Creatinine, Ser: 0.98 mg/dL (ref 0.40–1.20)
GFR: 52.98 mL/min — ABNORMAL LOW (ref >60.00)
Glucose, Bld: 95 mg/dL (ref 70–99)
Potassium: 4.1 mEq/L (ref 3.5–5.1)
Sodium: 137 mEq/L (ref 135–145)
Total Bilirubin: 0.6 mg/dL (ref 0.2–1.2)
Total Protein: 6.6 g/dL (ref 6.0–8.3)

## 2020-08-28 LAB — CBC
HCT: 36.3 % (ref 36.0–46.0)
Hemoglobin: 12.2 g/dL (ref 12.0–15.0)
MCHC: 33.5 g/dL (ref 30.0–36.0)
MCV: 92.8 fl (ref 78.0–100.0)
Platelets: 150 10*3/uL (ref 150.0–400.0)
RBC: 3.91 Mil/uL (ref 3.87–5.11)
RDW: 13.7 % (ref 11.5–15.5)
WBC: 8.6 10*3/uL (ref 4.0–10.5)

## 2020-08-28 LAB — TSH: TSH: 1.23 u[IU]/mL (ref 0.35–5.50)

## 2020-08-28 LAB — BRAIN NATRIURETIC PEPTIDE: Pro B Natriuretic peptide (BNP): 156 pg/mL — ABNORMAL HIGH (ref 0.0–100.0)

## 2020-08-28 MED ORDER — AZITHROMYCIN 250 MG PO TABS: ORAL_TABLET | ORAL | 0 refills | Status: AC

## 2020-08-28 NOTE — Assessment & Plan Note (Signed)
Patient complains of vague symptomology for approximately 2 weeks.  Patient states sinus pressure no tenderness on palpation.  Patient already takes antihistamine daily.  Continue antihistamine.

## 2020-08-28 NOTE — Patient Instructions (Signed)
We will draw labs today Follow up with Cardiology as scheduled. Sooner if needed Follow up in 3 months for a wellness exam

## 2020-08-28 NOTE — Progress Notes (Signed)
Established Patient Office Visit  Subjective:  Patient ID: Amy Lin, female    DOB: [birth year]  Age: 87 y.o. MRN: [id number]  CC:  Chief Complaint  Patient presents with   Transfer of Care    From [facility]   Shortness of Breath    Had MI in February. She was feeling well until about a few weeks ago noticed SOB with mainly walking. Some dizziness-but attributes that to low B/P she gets at times.   Sinus Problem    Has noticed sinus pressure, some headaches, runny nose and fever ranging from 99-101 in the past 2 weeks. Took Covid test at home x 2, last test was done the past weekend and both times were negative    HPI Amy Lin presents for transfer of care  Shortness of breath: patient states that several weeks ago she noticed an increase in shortness of breath. States that she notices it more with activity. Abates with rest. States that she does not know if it is worse when she lies flat. States no CP over the past few weeks. States that one week did expe chest pain but attributes to GERD. She has not told her cardiology group about it but states she has an appt with cardiology first week of August  Sinus problem: History of sinus issues and allergies. States had runny nose for several weeks and a low grade fever for several weeks. States her head feels "tight" and feels like a headache. Takes zyrtec. No OTC regimen  Past Medical History:  Diagnosis Date   Allergy    Coronary artery disease    GERD (gastroesophageal reflux disease)    Hyperlipidemia    Hypertension    diuretic for edema at first   Hypothyroidism    Myocardial infarction ([place])    Osteoarthritis, multiple sites    Osteoporosis     Past Surgical History:  Procedure Laterality Date   ABDOMINAL HYSTERECTOMY     BACK SURGERY  1999   Discectomy and Ray cage   BLADDER REPAIR  ~2010   CARDIAC CATHETERIZATION  03/05/2020   CARPAL TUNNEL RELEASE  8/15   COLONOSCOPY W/ BIOPSIES N/A 2016   CORONARY  STENT INTERVENTION N/A 03/06/2020   Procedure: CORONARY STENT INTERVENTION;  Surgeon: Adrian Prows, MD;  Location: [place] [facility];  Service: Cardiovascular;  Laterality: N/A;   CORONARY THROMBECTOMY N/A 03/05/2020   Procedure: Coronary Thrombectomy;  Surgeon: Adrian Prows, MD;  Location: [place] [facility];  Service: Cardiovascular;  Laterality: N/A;   CORONARY/GRAFT ACUTE MI REVASCULARIZATION N/A 03/05/2020   Procedure: Coronary/Graft Acute MI Revascularization;  Surgeon: Adrian Prows, MD;  Location: [place] [facility];  Service: Cardiovascular;  Laterality: N/A;   INTRAVASCULAR ULTRASOUND/IVUS N/A 03/06/2020   Procedure: Intravascular Ultrasound/IVUS;  Surgeon: Adrian Prows, MD;  Location: [place] [facility];  Service: Cardiovascular;  Laterality: N/A;   LEFT HEART CATH AND CORONARY ANGIOGRAPHY N/A 03/05/2020   Procedure: LEFT HEART CATH AND CORONARY ANGIOGRAPHY;  Surgeon: Adrian Prows, MD;  Location: [place] [facility];  Service: Cardiovascular;  Laterality: N/A;   MENISCECTOMY Left 2/15   Dr Veverly Fells   REFRACTIVE SURGERY Bilateral 07/2019   UPPER GI ENDOSCOPY N/A Feb 2017    Family History  Problem Relation Age of Onset   Heart disease Mother    Stroke Father    Hypertension Father    Heart disease Father    Diabetes Father     Social History   Socioeconomic History  Marital status: Divorced    Spouse name: Not on file   Number of children: 3   Years of education: Not on file   Highest education level: Not on file  Occupational History   Occupation: Audiological scientist    Comment: [employer]  Tobacco Use   Smoking status: Never   Smokeless tobacco: Never  Vaping Use   Vaping Use: Never used  Substance and Sexual Activity   Alcohol use: No   Drug use: No   Sexual activity: Never  Other Topics Concern   Not on file  Social History Narrative   3 children--- 1 died   Lives alone      No living will   Requests son and daughter as health care POAs   Would accept resuscitation attempts  but no prolonged artificial ventilation   Not sure about tube feeds   Social Determinants of Health   Financial Resource Strain: Not on file  Food Insecurity: Not on file  Transportation Needs: Not on file  Physical Activity: Not on file  Stress: Not on file  Social Connections: Not on file  Intimate Partner Violence: Not on file    Outpatient Medications Prior to Visit  Medication Sig Dispense Refill   aspirin 81 MG EC tablet Take 81 mg by mouth daily.     carvedilol (COREG) 3.125 MG tablet Take 1 tablet (3.125 mg total) by mouth 2 (two) times daily. 180 tablet 3   cetirizine (ZYRTEC) 10 MG tablet Take 10 mg by mouth daily.     Cholecalciferol (VITAMIN D3) 2000 UNITS TABS Take 1 tablet by mouth every evening.     Cyanocobalamin (VITAMIN B 12 PO) Take 1 tablet by mouth every evening.     ezetimibe (ZETIA) 10 MG tablet TAKE 1 TABLET BY MOUTH ONCE A DAY AFTER SUPPER 90 tablet 2   furosemide (LASIX) 40 MG tablet Take 0.5 tablets (20 mg total) by mouth daily as needed for fluid or edema. 30 tablet 3   levothyroxine (SYNTHROID) 75 MCG tablet TAKE 1 TABLET BY MOUTH ONCE A DAY ON AN EMPTY STOMACH.TAKE WITH A GLASS OF WATERAT LEAST 30-60 MIN BEFORE BREAKFAST 90 tablet 1   lisinopril (ZESTRIL) 5 MG tablet Take 1 tablet (5 mg total) by mouth daily. 90 tablet 3   pantoprazole (PROTONIX) 20 MG tablet TAKE 1 TABLET BY MOUTH TWICE A DAY (Patient taking differently: Take 20 mg by mouth 2 (two) times daily.) 180 tablet 2   potassium chloride SA (KLOR-CON) 20 MEQ tablet Take 1 tablet (20 mEq total) by mouth daily as needed. 30 tablet 3   rosuvastatin (CRESTOR) 20 MG tablet Take 1 tablet (20 mg total) by mouth daily. 90 tablet 3   ticagrelor (BRILINTA) 90 MG TABS tablet Take 1 tablet (90 mg total) by mouth 2 (two) times daily. 180 tablet 3   nitroGLYCERIN (NITROSTAT) 0.4 MG SL tablet Place 1 tablet (0.4 mg total) under the tongue every 5 (five) minutes as needed for up to 25 days for chest pain. 25 tablet  3   No facility-administered medications prior to visit.    No Known Allergies  ROS Review of Systems  Constitutional:  Positive for fever. Negative for chills.  HENT:  Positive for sinus pressure. Negative for ear discharge and ear pain.   Respiratory:  Positive for shortness of breath. Negative for cough.   Cardiovascular:  Negative for chest pain and leg swelling.  Gastrointestinal:  Positive for nausea. Negative for diarrhea and vomiting.  Skin:  Negative for color change.  Neurological:  Positive for dizziness.  Hematological:  Negative for adenopathy.     Objective:    Physical Exam Constitutional:      Appearance: She is well-developed.  HENT:     Head:      Right Ear: Tympanic membrane, ear canal and external ear normal.     Left Ear: Tympanic membrane, ear canal and external ear normal.  Neck:     Thyroid: No thyroid mass, thyromegaly or thyroid tenderness.  Cardiovascular:     Rate and Rhythm: Normal rate and regular rhythm.  Pulmonary:     Effort: Pulmonary effort is normal.     Breath sounds: Normal breath sounds.  Musculoskeletal:     Right lower leg: No tenderness. No edema.     Left lower leg: No tenderness. No edema.  Lymphadenopathy:     Cervical: No cervical adenopathy.  Skin:    General: Skin is warm.  Neurological:     General: No focal deficit present.     Mental Status: She is alert.    BP 102/64   Pulse 72   Temp 98.4 F (36.9 C)   Resp 20   Ht 5' 3.5" (1.613 m)   Wt 146 lb (66.2 kg)   SpO2 94%   BMI 25.46 kg/m  Wt Readings from Last 3 Encounters:  08/28/20 146 lb (66.2 kg)  06/11/20 151 lb 14.4 oz (68.9 kg)  06/10/20 151 lb 12.8 oz (68.9 kg)     Health Maintenance Due  Topic Date Due   Zoster Vaccines- Shingrix (1 of 2) Never done   COVID-19 Vaccine (4 - Booster for Pfizer series) 06/10/2020    There are no preventive care reminders to display for this patient.  Lab Results  Component Value Date   TSH 2.002 03/05/2020    Lab Results  Component Value Date   WBC 7.9 03/07/2020   HGB 11.7 (L) 03/07/2020   HCT 35.6 (L) 03/07/2020   MCV 93.9 03/07/2020   PLT 151 03/07/2020   Lab Results  Component Value Date   NA 140 03/07/2020   K 3.8 03/07/2020   CO2 22 03/07/2020   GLUCOSE 110 (H) 03/07/2020   BUN 18 03/07/2020   CREATININE 1.14 (H) 03/07/2020   BILITOT 0.7 03/05/2020   ALKPHOS 51 03/05/2020   AST 22 03/05/2020   ALT 16 03/05/2020   PROT 6.8 03/05/2020   ALBUMIN 3.4 (L) 03/05/2020   CALCIUM 8.9 03/07/2020   ANIONGAP 11 03/07/2020   GFR 61.27 09/25/2019   Lab Results  Component Value Date   CHOL 148 07/08/2020   Lab Results  Component Value Date   HDL 51 07/08/2020   Lab Results  Component Value Date   LDLCALC 74 07/08/2020   Lab Results  Component Value Date   TRIG 128 07/08/2020   Lab Results  Component Value Date   CHOLHDL 3.8 03/05/2020   Lab Results  Component Value Date   HGBA1C 6.3 09/25/2019      Assessment & Plan:   Problem List Items Addressed This Visit       Cardiovascular and Mediastinum   Essential hypertension    Managed by her cardiology group. Does measure her blood pressure at home daily. Does have episodes of hypotension and will hold her dose of medication. She has had this discussion with her cardiology provider Continue Coreg and lisinopril. F/u with cardiology       Relevant Orders   CBC  Comprehensive metabolic panel     Endocrine   Hypothyroidism    Patient taking her medication as prescribed. Will recheck TSH today, pending result.       Relevant Orders   TSH     Other   Dyspnea on exertion - Primary    Shortness of breath on exertion.  Patient has extensive cardiac history.  Recent MI in February 2022.  Is followed by [facility] cardiology.  Has appointment coming up first week of August.  Did EKG in office reassuring for no active MI.  Patient states she does have nitroglycerin at home if she experiences angina. Draw labs,  pending results.  We will also order chest x-ray given sinus problem and complaint of shortness of breath.       Relevant Orders   EKG 12-Lead (Completed)   Brain natriuretic peptide   DG Chest 2 View   Rhinorrhea    Patient complains of vague symptomology for approximately 2 weeks.  Patient states sinus pressure no tenderness on palpation.  Patient already takes antihistamine daily.  Continue antihistamine.       Sinus pressure    Patient complains of vague symptomology for approximately 2 weeks.  Patient states sinus pressure no tenderness on palpation.  Patient already takes antihistamine daily.  Continue antihistamine. Given extended length of patient symptomology with waxing and waning fevers will opt to treat with azithromycin.  Patient has tolerated well in the past.  According to patient EKG today QTC is below 400 ms.       Relevant Medications   azithromycin (ZITHROMAX) 250 MG tablet    No orders of the defined types were placed in this encounter.   Follow-up: Return in about 3 months (around 11/28/2020) for Wellness exam.    Romilda Garret, NP

## 2020-08-28 NOTE — Assessment & Plan Note (Signed)
Managed by her cardiology group. Does measure her blood pressure at home daily. Does have episodes of hypotension and will hold her dose of medication. She has had this discussion with her cardiology provider Continue Coreg and lisinopril. F/u with cardiology

## 2020-08-28 NOTE — Assessment & Plan Note (Addendum)
Patient complains of vague symptomology for approximately 2 weeks.  Patient states sinus pressure no tenderness on palpation.  Patient already takes antihistamine daily.  Continue antihistamine. Given extended length of patient symptomology with waxing and waning fevers will opt to treat with azithromycin.  Patient has tolerated well in the past.  According to patient EKG today QTC is below 400 ms.

## 2020-08-28 NOTE — Assessment & Plan Note (Addendum)
Patient taking her medication as prescribed. Will recheck TSH today, pending result.

## 2020-08-28 NOTE — Assessment & Plan Note (Signed)
Shortness of breath on exertion.  Patient has extensive cardiac history.  Recent MI in February 2022.  Is followed by [facility] cardiology.  Has appointment coming up first week of August.  Did EKG in office reassuring for no active MI.  Patient states she does have nitroglycerin at home if she experiences angina. Draw labs, pending results.  We will also order chest x-ray given sinus problem and complaint of shortness of breath.

## 2020-08-31 ENCOUNTER — Encounter: Payer: Self-pay | Admitting: Nurse Practitioner

## 2020-09-10 ENCOUNTER — Ambulatory Visit: Payer: PPO | Admitting: Student

## 2020-09-10 ENCOUNTER — Other Ambulatory Visit: Payer: Self-pay

## 2020-09-10 ENCOUNTER — Encounter: Payer: Self-pay | Admitting: Student

## 2020-09-10 VITALS — BP 135/66 | HR 65 | Temp 98.2°F | Resp 17 | Ht 63.5 in | Wt 149.2 lb

## 2020-09-10 DIAGNOSIS — I10 Essential (primary) hypertension: Secondary | ICD-10-CM | POA: Diagnosis not present

## 2020-09-10 DIAGNOSIS — I252 Old myocardial infarction: Secondary | ICD-10-CM | POA: Diagnosis not present

## 2020-09-10 DIAGNOSIS — I2510 Atherosclerotic heart disease of native coronary artery without angina pectoris: Secondary | ICD-10-CM | POA: Diagnosis not present

## 2020-09-10 DIAGNOSIS — E7800 Pure hypercholesterolemia, unspecified: Secondary | ICD-10-CM

## 2020-09-10 NOTE — Progress Notes (Signed)
Primary Physician/Referring:  Michela Pitcher, NP  Patient ID: Amy Lin, female    DOB: 13-Jun-1933, 87 y.o.   MRN: [id number]  Chief Complaint  Patient presents with   Coronary Artery Disease   HLD   Follow-up    3 MONTH   HPI:    Amy Lin  is a 87 y.o. Caucasian female with history of hypertension, hyperlipidemia, GERD who presented to [facility] emergency department 03/05/2020 with active chest pain and EKG revealing inferior STEMI.  Underwent successful angioplasty and stenting to proximal RCA, with subsequent staged intervention to OM1.  Recommend patient continue DAPT until at least 03/07/2021 if tolerated.   Patient presents for 65-monthfollow-up of CAD, hypertension, and hyperlipidemia.  Notably patient was recently seen by PCP on 08/28/2020 with complaints of dyspnea on exertion, she was advised at that time to take additional dose of Lasix.  At last office visit with u[facility]ano changes were made.  Patient states that since being seen by her PCP approximately 1.5 weeks ago she has been taking Lasix 20 mg daily along with potassium supplement.  She reports dyspnea on exertion has resolved.  Patient is otherwise feeling well without complaints today.  Denies chest pain, palpitations, syncope, near syncope.  Denies leg swelling, orthopnea, PND.  Patient is without formal exercise routine, she does some walking around the house without issue.  She does note 2 episodes since last visit of chest discomfort for which she took nitroglycerin without much relief, however she achieved relief with belching and Tums.  Notably patient does have history of GERD for which she takes daily Protonix.  Past Medical History:  Diagnosis Date   Allergy    Coronary artery disease    GERD (gastroesophageal reflux disease)    Hyperlipidemia    Hypertension    diuretic for edema at first   Hypothyroidism    Myocardial infarction ([place]    Osteoarthritis, multiple sites    Osteoporosis    Past Surgical  History:  Procedure Laterality Date   ABDOMINAL HYSTERECTOMY     BACK SURGERY  1999   Discectomy and Ray cage   BLADDER REPAIR  ~2010   CARDIAC CATHETERIZATION  03/05/2020   CARPAL TUNNEL RELEASE  8/15   COLONOSCOPY W/ BIOPSIES N/A 2016   CORONARY STENT INTERVENTION N/A 03/06/2020   Procedure: CORONARY STENT INTERVENTION;  Surgeon: GAdrian Prows MD;  [facility];  Service: Cardiovascular;  Laterality: N/A;   CORONARY THROMBECTOMY N/A 03/05/2020   Procedure: Coronary Thrombectomy;  Surgeon: GAdrian Prows MD;  [facility];  Service: Cardiovascular;  Laterality: N/A;   CORONARY/GRAFT ACUTE MI REVASCULARIZATION N/A 03/05/2020   Procedure: Coronary/Graft Acute MI Revascularization;  Surgeon: GAdrian Prows MD;  [facility];  Service: Cardiovascular;  Laterality: N/A;   INTRAVASCULAR ULTRASOUND/IVUS N/A 03/06/2020   Procedure: Intravascular Ultrasound/IVUS;  Surgeon: GAdrian Prows MD;  [facility];  Service: Cardiovascular;  Laterality: N/A;   LEFT HEART CATH AND CORONARY ANGIOGRAPHY N/A 03/05/2020   Procedure: LEFT HEART CATH AND CORONARY ANGIOGRAPHY;  Surgeon: GAdrian Prows MD;  [facility];  Service: Cardiovascular;  Laterality: N/A;   MENISCECTOMY Left 2/15   Dr [facility]everly F[facility]  REFRACTIVE SURGERY Bilateral 07/2019   UPPER GI ENDOSCOPY N/A Feb 2017   Family History  Problem Relation Age of Onset   Heart disease Mother    Stroke Father    Hypertension Father    Heart disease  Father    Diabetes Father     Social History   Tobacco Use   Smoking status: Never   Smokeless tobacco: Never  Substance Use Topics   Alcohol use: No   Marital Status: Divorced   ROS  Review of Systems  Constitutional: Negative for malaise/fatigue and weight gain.  Cardiovascular:  Positive for leg swelling (minimal, stable). Negative for chest pain, claudication, dyspnea on exertion (resolved), near-syncope, orthopnea, palpitations, paroxysmal  nocturnal dyspnea and syncope.  Respiratory:  Negative for shortness of breath.   Hematologic/Lymphatic: Does not bruise/bleed easily.  Gastrointestinal:  Negative for melena.  Neurological:  Negative for dizziness and weakness.   Objective  Blood pressure 135/66, pulse 65, temperature 98.2 F (36.8 C), resp. rate 17, height 5' 3.5" (1.613 m), weight 149 lb 3.2 oz (67.7 kg).  Vitals with BMI 09/10/2020 08/28/2020 06/11/2020  Height 5' 3.5" 5' 3.5" 5' 4.2"  Weight 149 lbs 3 oz 146 lbs 151 lbs 14 oz  BMI 26.01 0000000 [id number]  Systolic [id number] -  Diastolic 66 64 -  Pulse 65 72 -     Physical Exam Vitals reviewed.  Constitutional:      Appearance: Normal appearance.  Cardiovascular:     Rate and Rhythm: Normal rate and regular rhythm.     Pulses: Intact distal pulses.          Carotid pulses are 2+ on the right side and 2+ on the left side.      Radial pulses are 2+ on the right side and 2+ on the left side.       Femoral pulses are 2+ on the right side and 2+ on the left side.      Popliteal pulses are 2+ on the right side and 2+ on the left side.       Dorsalis pedis pulses are 0 on the right side and 0 on the left side.       Posterior tibial pulses are 0 on the left side.     Heart sounds: S1 normal and S2 normal. Murmur heard.  Midsystolic murmur is present with a grade of 2/6 at the upper right sternal border.    No gallop.  Pulmonary:     Effort: Pulmonary effort is normal.     Breath sounds: Normal breath sounds.  Musculoskeletal:     Right lower leg: Edema (trace, ankle) present.     Left lower leg: Edema (trace, ankle) present.  Skin:    General: Skin is warm and dry.  Neurological:     Mental Status: She is alert.    Laboratory examination:   Recent Labs    03/05/20 1013 03/06/20 0055 03/07/20 0215 08/28/20 1104  NA 139 137 140 137  K 3.7 3.8 3.8 4.1  CL 106 107 107 103  CO2 21* 20* 22 26  GLUCOSE 129* 108* 110* 95  BUN '14 13 18 20  '$ CREATININE 0.80 0.90  1.14* 0.98  CALCIUM 9.4 8.5* 8.9 9.2  GFRNONAA >60 >60 47*  --    estimated creatinine clearance is 39.9 mL/min (by C-G formula based on SCr of 0.98 mg/dL).  CMP Latest Ref Rng & Units 08/28/2020 03/07/2020 03/06/2020  Glucose 70 - 99 mg/dL 95 110(H) 108(H)  BUN 6 - 23 mg/dL '20 18 13  '$ Creatinine 0.40 - 1.20 mg/dL 0.98 1.14(H) 0.90  Sodium 135 - 145 mEq/L 137 140 137  Potassium 3.5 - 5.1 mEq/L 4.1 3.8 3.8  Chloride 96 - 112 mEq/L 103  107 107  CO2 19 - 32 mEq/L 26 22 20(L)  Calcium 8.4 - 10.5 mg/dL 9.2 8.9 8.5(L)  Total Protein 6.0 - 8.3 g/dL 6.6 - -  Total Bilirubin 0.2 - 1.2 mg/dL 0.6 - -  Alkaline Phos 39 - 117 U/L 54 - -  AST 0 - 37 U/L 14 - -  ALT 0 - 35 U/L 11 - -   CBC Latest Ref Rng & Units 08/28/2020 03/07/2020 03/06/2020  WBC 4.0 - 10.5 K/uL 8.6 7.9 8.2  Hemoglobin 12.0 - 15.0 g/dL 12.2 11.7(L) 10.8(L)  Hematocrit 36.0 - 46.0 % 36.3 35.6(L) 32.8(L)  Platelets 150.0 - 400.0 K/uL 150.0 151 144(L)   Lipid Panel Recent Labs    09/25/19 1458 03/05/20 1013 07/08/20 0921  CHOL 194 194 148  TRIG 107.0 146 128  LDLCALC 119* 114* 74  VLDL 21.4 29  --   HDL 53.40 51 51  CHOLHDL 4 3.8  --    HEMOGLOBIN A1C Lab Results  Component Value Date   HGBA1C 6.3 09/25/2019   TSH Recent Labs    09/25/19 1458 03/05/20 1520 08/28/20 1104  TSH 2.47 2.002 1.23  BNP No results found for: BNP  ProBNP    Component Value Date/Time   PROBNP 156.0 (H) 08/28/2020 1104    External labs:  None   Allergies  No Known Allergies   Medications Prior to Visit:   Outpatient Medications Prior to Visit  Medication Sig Dispense Refill   aspirin 81 MG EC tablet Take 81 mg by mouth daily.     carvedilol (COREG) 3.125 MG tablet Take 1 tablet (3.125 mg total) by mouth 2 (two) times daily. 180 tablet 3   cetirizine (ZYRTEC) 10 MG tablet Take 10 mg by mouth daily.     Cholecalciferol (VITAMIN D3) 2000 UNITS TABS Take 1 tablet by mouth every evening.     Cyanocobalamin (VITAMIN B 12 PO) Take 1  tablet by mouth every evening.     ezetimibe (ZETIA) 10 MG tablet TAKE 1 TABLET BY MOUTH ONCE A DAY AFTER SUPPER 90 tablet 2   furosemide (LASIX) 40 MG tablet Take 0.5 tablets (20 mg total) by mouth daily as needed for fluid or edema. 30 tablet 3   levothyroxine (SYNTHROID) 75 MCG tablet TAKE 1 TABLET BY MOUTH ONCE A DAY ON AN EMPTY STOMACH.TAKE WITH A GLASS OF WATERAT LEAST 30-60 MIN BEFORE BREAKFAST 90 tablet 1   lisinopril (ZESTRIL) 5 MG tablet Take 1 tablet (5 mg total) by mouth daily. 90 tablet 3   nitroGLYCERIN (NITROSTAT) 0.4 MG SL tablet Place 1 tablet (0.4 mg total) under the tongue every 5 (five) minutes as needed for up to 25 days for chest pain. 25 tablet 3   pantoprazole (PROTONIX) 20 MG tablet TAKE 1 TABLET BY MOUTH TWICE A DAY (Patient taking differently: Take 20 mg by mouth 2 (two) times daily.) 180 tablet 2   potassium chloride SA (KLOR-CON) 20 MEQ tablet Take 1 tablet (20 mEq total) by mouth daily as needed. 30 tablet 3   rosuvastatin (CRESTOR) 20 MG tablet Take 1 tablet (20 mg total) by mouth daily. 90 tablet 3   ticagrelor (BRILINTA) 90 MG TABS tablet Take 1 tablet (90 mg total) by mouth 2 (two) times daily. 180 tablet 3   predniSONE (DELTASONE) 10 MG tablet Take by mouth.     No facility-administered medications prior to visit.   Final Medications at End of Visit    Current Meds  Medication Sig  aspirin 81 MG EC tablet Take 81 mg by mouth daily.   carvedilol (COREG) 3.125 MG tablet Take 1 tablet (3.125 mg total) by mouth 2 (two) times daily.   cetirizine (ZYRTEC) 10 MG tablet Take 10 mg by mouth daily.   Cholecalciferol (VITAMIN D3) 2000 UNITS TABS Take 1 tablet by mouth every evening.   Cyanocobalamin (VITAMIN B 12 PO) Take 1 tablet by mouth every evening.   ezetimibe (ZETIA) 10 MG tablet TAKE 1 TABLET BY MOUTH ONCE A DAY AFTER SUPPER   furosemide (LASIX) 40 MG tablet Take 0.5 tablets (20 mg total) by mouth daily as needed for fluid or edema.   levothyroxine  (SYNTHROID) 75 MCG tablet TAKE 1 TABLET BY MOUTH ONCE A DAY ON AN EMPTY STOMACH.TAKE WITH A GLASS OF WATERAT LEAST 30-60 MIN BEFORE BREAKFAST   lisinopril (ZESTRIL) 5 MG tablet Take 1 tablet (5 mg total) by mouth daily.   nitroGLYCERIN (NITROSTAT) 0.4 MG SL tablet Place 1 tablet (0.4 mg total) under the tongue every 5 (five) minutes as needed for up to 25 days for chest pain.   pantoprazole (PROTONIX) 20 MG tablet TAKE 1 TABLET BY MOUTH TWICE A DAY (Patient taking differently: Take 20 mg by mouth 2 (two) times daily.)   potassium chloride SA (KLOR-CON) 20 MEQ tablet Take 1 tablet (20 mEq total) by mouth daily as needed.   rosuvastatin (CRESTOR) 20 MG tablet Take 1 tablet (20 mg total) by mouth daily.   ticagrelor (BRILINTA) 90 MG TABS tablet Take 1 tablet (90 mg total) by mouth 2 (two) times daily.   Radiology:   No results found.  Cardiac Studies:   Echocardiogram 03/05/2020:   1. Due to mitral and calcification, diastolic function cannot be estimated. However appears to show pseudonormal pattern.. Left ventricular ejection fraction, by estimation, is 50 to 55%. The left ventricle has low normal function. The left ventricle  demonstrates regional wall motion abnormalities (see scoring diagram/findings for description). There is mild left ventricular hypertrophy. Left ventricular diastolic parameters are indeterminate. There is mild of the left ventricular, entire inferior  wall.  2. Right ventricular systolic function is normal. The right ventricular size is normal. There is mildly elevated pulmonary artery systolic pressure.  3. Left atrial size was mildly dilated.  4. The mitral valve is normal in structure. Trivial mitral valve regurgitation. Moderate mitral annular calcification.  5. The aortic valve is tricuspid. There is mild calcification of the aortic valve. Aortic valve regurgitation is trivial. Mild aortic valve sclerosis is present, with no evidence of aortic valve stenosis.  6. The  inferior vena cava is normal in size with <50% respiratory variability, suggesting right atrial pressure of 8 mmHg.    Left Heart Catheterization and stenting to RCA 03/05/20:  LV: Normal size.  Inferior akinesis.  LVEF 40%.  No significant MR.  Severe mitral annular calcification evident.  No pressure gradient across the aortic valve. LM: Large vessel, mildly calcified. CX: Very large vessel giving origin to large OM1 small OM 2 very large OM 3.  Ostium of the circumflex has a 30% stenosis.  Very large OM1 with high-grade and irregular marginal 95% stenosis in the proximal segment.  Moderate amount of calcification is evident in the coronary vessels.  Severe tortuosity is evident in the high OM1 and also large OM 3. LAD: Large caliber vessel giving origin to a moderate to large size D1.  Mild luminal irregularities evident. RCA: Large vessel and dominant vessel.  Tortuous in the mid to  distal segment.  It is occluded the proximal segment. Successful aspiration thrombectomy with Pronto V4 catheter followed by stenting with 4.0 x 30 mm resolute Onyx DES, stenosis reduced from 100% to 0% and TIMI 0 to TIMI-3 flow.    Coronary angioplasty to OM1 03/06/2020: OCT guided PTCA and stenting of the large OM1 with 2.5 x 15 mm resolute Onyx DES, stenosis reduced from 99% to 0%, >95% stent apposition and expansion.  No edge dissection.   Recommendation: Patient will hopefully be discharged home tomorrow if she remains stable.  She will need dual antiplatelet therapy for a year in view of ACS.  Continue risk factor modification is indicated.  115 mL contrast utilized.   EKG:  09/10/2020: Sinus rhythm with borderline first-degree AV block at a rate of 62 bpm.  Normal axis.  Poor refreshing, cannot exclude anteroseptal infarct old.  Inferior infarct old.  No evidence of ischemia or underlying injury pattern.  Low voltage complexes, consider pulmonary disease.  Nonspecific T wave abnormality.  EKG 03/13/2020: Sinus  bradycardia rate of 52 bpm, with borderline first-degree AV block..  Inferior infarct old.  Poor R wave progression, cannot exclude anteroseptal infarct old.  T wave abnormality, unchanged from previous.  Low voltage complexes, consider pulmonary disease pattern.  EKG 03/07/2020: Normal sinus rhythm with rate of 73 bpm, normal axis, T wave abnormality, consider inferolateral ischemia.  Compared to 03/06/2020, no significant change.  Assessment     ICD-10-CM   1. Coronary artery disease involving native coronary artery of native heart without angina pectoris  I25.10 Brain natriuretic peptide    Basic metabolic panel    2. Essential hypertension  I10 EKG 12-Lead    3. Hypercholesterolemia  E78.00     4. History of ST elevation myocardial infarction (STEMI)  I25.2 PCV ECHOCARDIOGRAM COMPLETE       Medications Discontinued During This Encounter  Medication Reason   predniSONE (DELTASONE) 10 MG tablet Error    No orders of the defined types were placed in this encounter.   Recommendations:   VERLISA TACKETT is a 87 y.o. Caucasian female with history of hypertension, hyperlipidemia, GERD who presented to [facility] emergency department 03/05/2020 with active chest pain and EKG revealing inferior STEMI.  Underwent successful angioplasty and stenting to proximal RCA, with subsequent staged intervention to OM1.  Recommend patient continue DAPT until at least 03/07/2021 if tolerated.   Patient presents for 45-monthfollow-up of CAD, hypertension, and hyperlipidemia.  Notably patient was recently seen by PCP on 08/28/2020 with complaints of dyspnea on exertion, she was advised at that time to take additional dose of Lasix.  At last office visit with uKoreano changes were made.  Patient's dyspnea on exertion has resolved with use of 20 mg Lasix daily.  However as patient is now taking Lasix daily will obtain repeat BMP and BNP.  Her BNP was mildly elevated at PCPs office visit.  Blood pressure is well  controlled and patient is tolerating dual antiplatelet therapy without bleeding diathesis.  We will continue guideline directed medical therapy including carvedilol, lisinopril, rosuvastatin, aspirin, Brilinta.  We will also continue Zetia.  For now we will continue Lasix.  Will obtain repeat echocardiogram prior to next office visit.  Follow-up in 6 months, sooner if needed, for CAD, hyperlipidemia.   CAlethia Berthold PA-C 09/10/2020, 2:43 PM Office: [phone]

## 2020-09-28 ENCOUNTER — Other Ambulatory Visit: Payer: Self-pay | Admitting: Nurse Practitioner

## 2020-09-28 DIAGNOSIS — Z1231 Encounter for screening mammogram for malignant neoplasm of breast: Secondary | ICD-10-CM

## 2020-09-30 ENCOUNTER — Other Ambulatory Visit: Payer: Self-pay | Admitting: Internal Medicine

## 2020-11-04 ENCOUNTER — Other Ambulatory Visit: Payer: Self-pay

## 2020-11-04 ENCOUNTER — Ambulatory Visit
Admission: RE | Admit: 2020-11-04 | Discharge: 2020-11-04 | Disposition: A | Discharge status: Home / Self Care | Payer: PPO | Source: Ambulatory Visit | Attending: Nurse Practitioner | Admitting: Nurse Practitioner

## 2020-11-04 DIAGNOSIS — Z1231 Encounter for screening mammogram for malignant neoplasm of breast: Secondary | ICD-10-CM | POA: Diagnosis not present

## 2020-11-13 ENCOUNTER — Other Ambulatory Visit: Payer: Self-pay | Admitting: Family

## 2020-11-13 DIAGNOSIS — E039 Hypothyroidism, unspecified: Secondary | ICD-10-CM

## 2020-11-30 ENCOUNTER — Other Ambulatory Visit: Payer: Self-pay

## 2020-11-30 ENCOUNTER — Ambulatory Visit (INDEPENDENT_AMBULATORY_CARE_PROVIDER_SITE_OTHER): Payer: PPO | Admitting: Nurse Practitioner

## 2020-11-30 ENCOUNTER — Ambulatory Visit (INDEPENDENT_AMBULATORY_CARE_PROVIDER_SITE_OTHER)
Admission: RE | Admit: 2020-11-30 | Discharge: 2020-11-30 | Disposition: A | Discharge status: Home / Self Care | Payer: PPO | Source: Ambulatory Visit | Attending: Nurse Practitioner | Admitting: Nurse Practitioner

## 2020-11-30 ENCOUNTER — Encounter: Payer: Self-pay | Admitting: Nurse Practitioner

## 2020-11-30 VITALS — BP 136/78 | HR 71 | Temp 98.4°F | Resp 12 | Ht 63.5 in | Wt 147.2 lb

## 2020-11-30 DIAGNOSIS — M25512 Pain in left shoulder: Secondary | ICD-10-CM | POA: Diagnosis not present

## 2020-11-30 DIAGNOSIS — R202 Paresthesia of skin: Secondary | ICD-10-CM | POA: Diagnosis not present

## 2020-11-30 DIAGNOSIS — E782 Mixed hyperlipidemia: Secondary | ICD-10-CM | POA: Diagnosis not present

## 2020-11-30 DIAGNOSIS — I10 Essential (primary) hypertension: Secondary | ICD-10-CM | POA: Diagnosis not present

## 2020-11-30 DIAGNOSIS — Z0000 Encounter for general adult medical examination without abnormal findings: Secondary | ICD-10-CM

## 2020-11-30 DIAGNOSIS — I2510 Atherosclerotic heart disease of native coronary artery without angina pectoris: Secondary | ICD-10-CM | POA: Diagnosis not present

## 2020-11-30 DIAGNOSIS — R0609 Other forms of dyspnea: Secondary | ICD-10-CM

## 2020-11-30 DIAGNOSIS — E039 Hypothyroidism, unspecified: Secondary | ICD-10-CM

## 2020-11-30 DIAGNOSIS — M810 Age-related osteoporosis without current pathological fracture: Secondary | ICD-10-CM

## 2020-11-30 DIAGNOSIS — M19012 Primary osteoarthritis, left shoulder: Secondary | ICD-10-CM | POA: Diagnosis not present

## 2020-11-30 LAB — TSH: TSH: 0.39 u[IU]/mL (ref 0.35–5.50)

## 2020-11-30 LAB — COMPREHENSIVE METABOLIC PANEL
ALT: 10 U/L (ref 0–35)
AST: 16 U/L (ref 0–37)
Albumin: 3.9 g/dL (ref 3.5–5.2)
Alkaline Phosphatase: 54 U/L (ref 39–117)
BUN: 15 mg/dL (ref 6–23)
CO2: 25 mEq/L (ref 19–32)
Calcium: 9.4 mg/dL (ref 8.4–10.5)
Chloride: 106 mEq/L (ref 96–112)
Creatinine, Ser: 0.96 mg/dL (ref 0.40–1.20)
GFR: 54.21 mL/min — ABNORMAL LOW (ref >60.00)
Glucose, Bld: 107 mg/dL — ABNORMAL HIGH (ref 70–99)
Potassium: 4.2 mEq/L (ref 3.5–5.1)
Sodium: 141 mEq/L (ref 135–145)
Total Bilirubin: 0.6 mg/dL (ref 0.2–1.2)
Total Protein: 7.1 g/dL (ref 6.0–8.3)

## 2020-11-30 LAB — CBC
HCT: 36.4 % (ref 36.0–46.0)
Hemoglobin: 12.1 g/dL (ref 12.0–15.0)
MCHC: 33.2 g/dL (ref 30.0–36.0)
MCV: 93 fl (ref 78.0–100.0)
Platelets: 183 10*3/uL (ref 150.0–400.0)
RBC: 3.91 Mil/uL (ref 3.87–5.11)
RDW: 13.9 % (ref 11.5–15.5)
WBC: 7.9 10*3/uL (ref 4.0–10.5)

## 2020-11-30 LAB — VITAMIN B12: Vitamin B-12: 775 pg/mL (ref 211–911)

## 2020-11-30 LAB — LIPID PANEL
Cholesterol: 131 mg/dL (ref 0–200)
HDL: 44.7 mg/dL (ref >39.00)
LDL Cholesterol: 64 mg/dL (ref 0–99)
NonHDL: 86.26
Total CHOL/HDL Ratio: 3
Triglycerides: 112 mg/dL (ref 0.0–149.0)
VLDL: 22.4 mg/dL (ref 0.0–40.0)

## 2020-11-30 LAB — T4, FREE: Free T4: 1.03 ng/dL (ref 0.60–1.60)

## 2020-11-30 NOTE — Assessment & Plan Note (Signed)
Pending x-ray of left shoulder.  Continue taking Tylenol as needed for pain.

## 2020-11-30 NOTE — Assessment & Plan Note (Signed)
Patient with history of osteoporosis last DEXA scan was in 2020.  Did give patient information to call [facility] imaging breast center and schedule appointment for DEXA patient acknowledged

## 2020-11-30 NOTE — Assessment & Plan Note (Signed)
Intermittent right lower extremity laterally.  Seems with positions.  Patient is not diabetic does not have sensory disturbance on exam.  Does have a history of back surgery in the past could be contributing.  We will check labs, pending results.

## 2020-11-30 NOTE — Assessment & Plan Note (Signed)
Patient stable on current medication regimen followed by cardiology.  Continue taking medications as prescribed Patient carvedilol, lisinopril.

## 2020-11-30 NOTE — Assessment & Plan Note (Signed)
Currently maintained on levothyroxine 75 mcg daily.  Pending lab result in office for level check.  Continue levothyroxine 75 mcg as prescribed

## 2020-11-30 NOTE — Progress Notes (Signed)
Established Patient Office Visit  Subjective:  Patient ID: Amy Lin, female    DOB: [birth year]  Age: 87 y.o. MRN: [id number]  CC:  Chief Complaint  Patient presents with   Medicare Wellness    HPI Amy Lin presents for complete physical and follow up of chronic conditions.  Immunizations: -Tetanus: Due patient wants to hold off -Influenza: UTD -Covid-19: UTP -Shingles: Has had zostavax in the past. Informed her to get from pharmacy if she would like it -Pneumonia: UTD  -HPV: Aged out   Diet: [place].  Exercise: No regular exercise.  Eye exam: Completes annually  Dental exam: Completes semi-annually   Pap Smear: Hysterectomy Mammogram: Completed in 11/04/2020 Dexa: Completed in 10/01/2018 Colonoscopy: Completed in 2016   Lung Cancer Screening: NA  Reviewed paperwork with patient at bedside and sent it to be scanned into the chart   Left shoulder pain: has been going on for approx 2-3 weeks. No known injury. Has happened to her right shoulder in the past and we evaluated by Ortho and went through PT. Has been using Tylenol arthritis with relief. Hearing Screening   1000Hz  2000Hz  4000Hz  5000Hz   Right ear 40 25 0 0  Left ear 25 25 25 25   Vision Screening - Comments:: Had eye exam in July 2022- Opthalmologist   PHQ9 SCORE ONLY 11/30/2020 06/02/2020 04/28/2020  PHQ-9 Total Score 0 2 5        Past Medical History:  Diagnosis Date   Allergy    Coronary artery disease    GERD (gastroesophageal reflux disease)    Hyperlipidemia    Hypertension    diuretic for edema at first   Hypothyroidism    Myocardial infarction ([place])    Osteoarthritis, multiple sites    Osteoporosis     Past Surgical History:  Procedure Laterality Date   ABDOMINAL HYSTERECTOMY     BACK SURGERY  1999   Discectomy and Ray cage   BLADDER REPAIR  ~2010   CARDIAC CATHETERIZATION  03/05/2020   CARPAL TUNNEL RELEASE  8/15   COLONOSCOPY W/ BIOPSIES N/A 2016   CORONARY  STENT INTERVENTION N/A 03/06/2020   Procedure: CORONARY STENT INTERVENTION;  Surgeon: Adrian Prows, MD;  Location: [facility];  Service: Cardiovascular;  Laterality: N/A;   CORONARY THROMBECTOMY N/A 03/05/2020   Procedure: Coronary Thrombectomy;  Surgeon: Adrian Prows, MD;  Location: [facility];  Service: Cardiovascular;  Laterality: N/A;   CORONARY/GRAFT ACUTE MI REVASCULARIZATION N/A 03/05/2020   Procedure: Coronary/Graft Acute MI Revascularization;  Surgeon: Adrian Prows, MD;  Location: [facility];  Service: Cardiovascular;  Laterality: N/A;   INTRAVASCULAR ULTRASOUND/IVUS N/A 03/06/2020   Procedure: Intravascular Ultrasound/IVUS;  Surgeon: Adrian Prows, MD;  Location: [facility];  Service: Cardiovascular;  Laterality: N/A;   LEFT HEART CATH AND CORONARY ANGIOGRAPHY N/A 03/05/2020   Procedure: LEFT HEART CATH AND CORONARY ANGIOGRAPHY;  Surgeon: Adrian Prows, MD;  Location: [facility];  Service: Cardiovascular;  Laterality: N/A;   MENISCECTOMY Left 2/15   Dr Veverly Fells   REFRACTIVE SURGERY Bilateral 07/2019   UPPER GI ENDOSCOPY N/A Feb 2017    Family History  Problem Relation Age of Onset   Heart disease Mother    Stroke Father    Hypertension Father    Heart disease Father    Diabetes Father     Social History   Socioeconomic History   Marital status: Divorced    Spouse name: Not on file  Number of children: 3   Years of education: Not on file   Highest education level: Not on file  Occupational History   Occupation: Audiological scientist    Comment: [employer]  Tobacco Use   Smoking status: Never   Smokeless tobacco: Never  Vaping Use   Vaping Use: Never used  Substance and Sexual Activity   Alcohol use: No   Drug use: No   Sexual activity: Never  Other Topics Concern   Not on file  Social History Narrative   3 children--- 1 died   Lives alone      No living will   Requests son and daughter as health care POAs   Would accept resuscitation attempts  but no prolonged artificial ventilation   Not sure about tube feeds   Social Determinants of Health   Financial Resource Strain: Not on file  Food Insecurity: Not on file  Transportation Needs: Not on file  Physical Activity: Not on file  Stress: Not on file  Social Connections: Not on file  Intimate Partner Violence: Not on file    Outpatient Medications Prior to Visit  Medication Sig Dispense Refill   aspirin 81 MG EC tablet Take 81 mg by mouth daily.     carvedilol (COREG) 3.125 MG tablet Take 1 tablet (3.125 mg total) by mouth 2 (two) times daily. 180 tablet 3   cetirizine (ZYRTEC) 10 MG tablet Take 10 mg by mouth daily.     Cholecalciferol (VITAMIN D3) 2000 UNITS TABS Take 1 tablet by mouth every evening.     Cyanocobalamin (VITAMIN B 12 PO) Take 1 tablet by mouth every evening.     ezetimibe (ZETIA) 10 MG tablet TAKE 1 TABLET BY MOUTH ONCE A DAY AFTER SUPPER 90 tablet 2   furosemide (LASIX) 40 MG tablet Take 0.5 tablets (20 mg total) by mouth daily as needed for fluid or edema. 30 tablet 3   levothyroxine (SYNTHROID) 75 MCG tablet TAKE 1 TABLET BY MOUTH ONCE A DAY ON AN EMPTY STOMACH.TAKE WITH A GLASS OF WATERAT LEAST 30-60 MIN BEFORE BREAKFAST 90 tablet 1   lisinopril (ZESTRIL) 5 MG tablet Take 1 tablet (5 mg total) by mouth daily. 90 tablet 3   pantoprazole (PROTONIX) 20 MG tablet TAKE 1 TABLET BY MOUTH TWICE A DAY 180 tablet 2   potassium chloride SA (KLOR-CON) 20 MEQ tablet Take 1 tablet (20 mEq total) by mouth daily as needed. 30 tablet 3   rosuvastatin (CRESTOR) 20 MG tablet Take 1 tablet (20 mg total) by mouth daily. 90 tablet 3   ticagrelor (BRILINTA) 90 MG TABS tablet Take 1 tablet (90 mg total) by mouth 2 (two) times daily. 180 tablet 3   nitroGLYCERIN (NITROSTAT) 0.4 MG SL tablet Place 1 tablet (0.4 mg total) under the tongue every 5 (five) minutes as needed for up to 25 days for chest pain. 25 tablet 3   No facility-administered medications prior to visit.    No  Known Allergies  ROS Review of Systems  Constitutional:  Negative for chills, fatigue and fever.  Eyes:  Negative for visual disturbance.  Respiratory:  Positive for shortness of breath (DOE). Negative for cough.   Cardiovascular:  Negative for chest pain and leg swelling.  Gastrointestinal:  Positive for diarrhea (does have loose stools intermittently). Negative for nausea and vomiting.  Musculoskeletal:  Positive for arthralgias.  Neurological:  Negative for dizziness, weakness, light-headedness and numbness (States a burning pain to RLE intermittently).  Psychiatric/Behavioral:  Negative for  hallucinations and suicidal ideas.      Objective:    Physical Exam Vitals and nursing note reviewed.  Constitutional:      Appearance: Normal appearance.  HENT:     Right Ear: Tympanic membrane, ear canal and external ear normal. There is no impacted cerumen.     Left Ear: Tympanic membrane, ear canal and external ear normal. There is no impacted cerumen.     Mouth/Throat:     Mouth: Mucous membranes are moist.     Pharynx: Oropharynx is clear.  Eyes:     Extraocular Movements: Extraocular movements intact.     Pupils: Pupils are equal, round, and reactive to light.  Neck:     Thyroid: No thyroid mass, thyromegaly or thyroid tenderness.  Cardiovascular:     Rate and Rhythm: Normal rate and regular rhythm.     Pulses: Normal pulses.  Pulmonary:     Effort: Pulmonary effort is normal.  Abdominal:     General: Bowel sounds are normal. There is no distension.     Palpations: There is no mass.     Tenderness: There is no abdominal tenderness.  Musculoskeletal:     Right lower leg: No edema.     Left lower leg: No edema.  Lymphadenopathy:     Cervical: No cervical adenopathy.  Skin:    General: Skin is warm.  Neurological:     General: No focal deficit present.     Mental Status: She is alert.     Motor: No weakness.     Coordination: Coordination normal.     Gait: Gait normal.      Deep Tendon Reflexes: Reflexes normal.     Reflex Scores:      Bicep reflexes are 2+ on the right side and 2+ on the left side.      Achilles reflexes are 2+ on the right side and 2+ on the left side.    Comments: Bilateral upper and lower strength extremities 5/5  Psychiatric:        Mood and Affect: Mood normal.        Behavior: Behavior normal.        Thought Content: Thought content normal.        Judgment: Judgment normal.    BP 136/78   Pulse 71   Temp 98.4 F (36.9 C)   Resp 12   Ht 5' 3.5" (1.613 m)   Wt 147 lb 3 oz (66.8 kg)   SpO2 96%   BMI 25.66 kg/m  Wt Readings from Last 3 Encounters:  11/30/20 147 lb 3 oz (66.8 kg)  09/10/20 149 lb 3.2 oz (67.7 kg)  08/28/20 146 lb (66.2 kg)     Health Maintenance Due  Topic Date Due   Zoster Vaccines- Shingrix (1 of 2) Never done   TETANUS/TDAP  02/01/2016   COVID-19 Vaccine (4 - Booster for Pfizer series) 04/07/2020    There are no preventive care reminders to display for this patient.  Lab Results  Component Value Date   TSH 1.23 08/28/2020   Lab Results  Component Value Date   WBC 8.6 08/28/2020   HGB 12.2 08/28/2020   HCT 36.3 08/28/2020   MCV 92.8 08/28/2020   PLT 150.0 08/28/2020   Lab Results  Component Value Date   NA 137 08/28/2020   K 4.1 08/28/2020   CO2 26 08/28/2020   GLUCOSE 95 08/28/2020   BUN 20 08/28/2020   CREATININE 0.98 08/28/2020   BILITOT 0.6 08/28/2020  ALKPHOS 54 08/28/2020   AST 14 08/28/2020   ALT 11 08/28/2020   PROT 6.6 08/28/2020   ALBUMIN 3.6 08/28/2020   CALCIUM 9.2 08/28/2020   ANIONGAP 11 03/07/2020   GFR 52.98 (L) 08/28/2020   Lab Results  Component Value Date   CHOL 148 07/08/2020   Lab Results  Component Value Date   HDL 51 07/08/2020   Lab Results  Component Value Date   LDLCALC 74 07/08/2020   Lab Results  Component Value Date   TRIG 128 07/08/2020   Lab Results  Component Value Date   CHOLHDL 3.8 03/05/2020   Lab Results  Component  Value Date   HGBA1C 6.3 09/25/2019      Assessment & Plan:   Problem List Items Addressed This Visit       Cardiovascular and Mediastinum   Essential hypertension   Relevant Orders   CBC (Completed)   Comprehensive metabolic panel (Completed)   CAD (coronary artery disease), native coronary artery   Relevant Orders   Lipid panel (Completed)     Endocrine   Hypothyroidism   Relevant Orders   CBC (Completed)   Comprehensive metabolic panel (Completed)   T4, free (Completed)   TSH (Completed)     Other   Dyspnea on exertion   Other Visit Diagnoses     Medicare annual wellness visit, subsequent    -  Primary   Relevant Orders   DG Bone Density   Paresthesia       Relevant Orders   Vitamin B12 (Completed)   Acute pain of left shoulder       Relevant Orders   DG Shoulder Left       No orders of the defined types were placed in this encounter.   Follow-up: Return in about 6 months (around 05/30/2021) for recheck.  This visit occurred during the SARS-CoV-2 public health emergency.  Safety protocols were in place, including screening questions prior to the visit, additional usage of staff PPE, and extensive cleaning of exam room while observing appropriate contact time as indicated for disinfecting solutions.     Romilda Garret, NP

## 2020-11-30 NOTE — Patient Instructions (Signed)
Nice to see you today Call [facility] breast center to schedule the bone density scan Let me know about the ENT Will see you in about 6 months

## 2020-11-30 NOTE — Assessment & Plan Note (Signed)
Reviewed paperwork with patient signed off and sent to be scanned.

## 2020-11-30 NOTE — Assessment & Plan Note (Signed)
Currently maintained on rosuvastatin 20 mg.  Last lipid check within normal limits.

## 2021-01-11 ENCOUNTER — Telehealth: Payer: Self-pay | Admitting: Nurse Practitioner

## 2021-01-11 NOTE — Chronic Care Management (AMB) (Signed)
  Chronic Care Management   Note  01/11/2021 Name: Amy Lin MRN: [id number] DOB: Mar 05, 1933  Amy Lin is a 87 y.o. year old female who is a primary care patient of Michela Pitcher, NP. I reached out to [name] by phone today in response to a referral sent by [name]'s PCP, Michela Pitcher, NP.   [name] was given information about Chronic Care Management services today including:  CCM service includes personalized support from designated clinical staff supervised by her physician, including individualized plan of care and coordination with other care providers 24/7 contact phone numbers for assistance for urgent and routine care needs. Service will only be billed when office clinical staff spend 20 minutes or more in a month to coordinate care. Only one practitioner may furnish and bill the service in a calendar month. The patient may stop CCM services at any time (effective at the end of the month) by phone call to the office staff.   Patient agreed to services and verbal consent obtained.   Follow up plan:   Tatjana Secretary/administrator

## 2021-02-03 ENCOUNTER — Ambulatory Visit
Admission: EM | Admit: 2021-02-03 | Discharge: 2021-02-03 | Disposition: A | Discharge status: Home / Self Care | Payer: PPO | Attending: Emergency Medicine | Admitting: Emergency Medicine

## 2021-02-03 ENCOUNTER — Other Ambulatory Visit: Payer: Self-pay

## 2021-02-03 ENCOUNTER — Encounter: Payer: Self-pay | Admitting: Emergency Medicine

## 2021-02-03 DIAGNOSIS — J0100 Acute maxillary sinusitis, unspecified: Secondary | ICD-10-CM

## 2021-02-03 MED ORDER — AMOXICILLIN 875 MG PO TABS: 875.0000 mg | ORAL_TABLET | Freq: Two times a day (BID) | ORAL | 0 refills | Status: AC

## 2021-02-03 NOTE — Discharge Instructions (Addendum)
Take the amoxicillin as directed.  Follow up with your primary care provider if your symptoms are not improving.   ° ° °

## 2021-02-03 NOTE — ED Provider Notes (Signed)
Amy Lin    CSN: [id number] Arrival date & time: 02/03/21  1610      History   Chief Complaint Chief Complaint  Patient presents with   Nasal Congestion   Cough    HPI Amy Lin is a 88 y.o. female.  Patient presents with >1 week history of nasal congestion, runny nose, postnasal drip, sinus pressure.  She developed cough yesterday.  OTC treatment attempted.  Negative at-home COVID test.  She denies fever, rash, shortness of breath, vomiting, diarrhea, or other symptoms.  Her medical history includes hypertension, MI, CAD, seasonal allergies, GERD.   The history is provided by the patient and medical records.   Past Medical History:  Diagnosis Date   Allergy    Coronary artery disease    GERD (gastroesophageal reflux disease)    Hyperlipidemia    Hypertension    diuretic for edema at first   Hypothyroidism    Myocardial infarction [facility])    Osteoarthritis, multiple sites    Osteoporosis     Patient Active Problem List   Diagnosis Date Noted   Medicare annual wellness visit, subsequent 11/30/2020   Paresthesia 11/30/2020   Acute pain of left shoulder 11/30/2020   Dyspnea on exertion 08/28/2020   Rhinorrhea 08/28/2020   Sinus pressure 08/28/2020   Unstable angina ([facility]CC)    Acute inferior myocardial infarction ([place]) 03/05/2020   CAD (coronary artery disease), native coronary artery 03/05/2020   Osteoarthritis 07/20/2017   Seasonal allergies 11/01/2007   Hyperlipemia 09/06/2006   Hypothyroidism 08/24/2006   Essential hypertension 08/24/2006   GERD 08/24/2006   Osteoporosis 08/24/2006    Past Surgical History:  Procedure Laterality Date   ABDOMINAL HYSTERECTOMY     BACK SURGERY  1999   Discectomy and Ray cage   BLADDER REPAIR  ~2010   CARDIAC CATHETERIZATION  03/05/2020   CARPAL TUNNEL RELEASE  8/15   COLONOSCOPY W/ BIOPSIES N/A 2016   CORONARY STENT INTERVENTION N/A 03/06/2020   Procedure: CORONARY STENT INTERVENTION;  Surgeon: Adrian Prows, MD;   Location: [facility];  Service: Cardiovascular;  Laterality: N/A;   CORONARY THROMBECTOMY N/A 03/05/2020   Procedure: Coronary Thrombectomy;  Surgeon: Adrian Prows, MD;  Location: [facility];  Service: Cardiovascular;  Laterality: N/A;   CORONARY/GRAFT ACUTE MI REVASCULARIZATION N/A 03/05/2020   Procedure: Coronary/Graft Acute MI Revascularization;  Surgeon: Adrian Prows, MD;  Location: [facility];  Service: Cardiovascular;  Laterality: N/A;   INTRAVASCULAR ULTRASOUND/IVUS N/A 03/06/2020   Procedure: Intravascular Ultrasound/IVUS;  Surgeon: Adrian Prows, MD;  Location: [facility];  Service: Cardiovascular;  Laterality: N/A;   LEFT HEART CATH AND CORONARY ANGIOGRAPHY N/A 03/05/2020   Procedure: LEFT HEART CATH AND CORONARY ANGIOGRAPHY;  Surgeon: Adrian Prows, MD;  Location: [facility];  Service: Cardiovascular;  Laterality: N/A;   MENISCECTOMY Left 2/15   Dr Veverly Fells   REFRACTIVE SURGERY Bilateral 07/2019   UPPER GI ENDOSCOPY N/A Feb 2017    OB History   No obstetric history on file.      Home Medications    Prior to Admission medications   Medication Sig Start Date End Date Taking? Authorizing Provider  amoxicillin (AMOXIL) 875 MG tablet Take 1 tablet (875 mg total) by mouth 2 (two) times daily for 7 days. 02/03/21 02/10/21 Yes Sharion Balloon, NP  aspirin 81 MG EC tablet Take 81 mg by mouth daily.    [provider]  carvedilol (COREG) 3.125 MG tablet Take 1  tablet (3.125 mg total) by mouth 2 (two) times daily. 06/17/20   Cantwell, Celeste C, PA-C  cetirizine (ZYRTEC) 10 MG tablet Take 10 mg by mouth daily.    [provider]  Cholecalciferol (VITAMIN D3) 2000 UNITS TABS Take 1 tablet by mouth every evening.    [provider]  Cyanocobalamin (VITAMIN B 12 PO) Take 1 tablet by mouth every evening.    [provider]  ezetimibe (ZETIA) 10 MG tablet TAKE 1 TABLET BY MOUTH ONCE A DAY AFTER SUPPER 06/03/20   Cantwell, Celeste C, PA-C   furosemide (LASIX) 40 MG tablet Take 0.5 tablets (20 mg total) by mouth daily as needed for fluid or edema. 06/17/20 06/12/21  Cantwell, Celeste C, PA-C  levothyroxine (SYNTHROID) 75 MCG tablet TAKE 1 TABLET BY MOUTH ONCE A DAY ON AN EMPTY STOMACH.TAKE WITH A GLASS OF WATERAT LEAST 30-60 MIN BEFORE BREAKFAST 11/13/20   Michela Pitcher, NP  lisinopril (ZESTRIL) 5 MG tablet Take 1 tablet (5 mg total) by mouth daily. 06/17/20 06/12/21  Cantwell, Celeste C, PA-C  nitroGLYCERIN (NITROSTAT) 0.4 MG SL tablet Place 1 tablet (0.4 mg total) under the tongue every 5 (five) minutes as needed for up to 25 days for chest pain. 03/08/20 09/10/20  Adrian Prows, MD  pantoprazole (PROTONIX) 20 MG tablet TAKE 1 TABLET BY MOUTH TWICE A DAY 10/02/20   Michela Pitcher, NP  potassium chloride SA (KLOR-CON) 20 MEQ tablet Take 1 tablet (20 mEq total) by mouth daily as needed. 06/10/20 06/05/21  Cantwell, Celeste C, PA-C  rosuvastatin (CRESTOR) 20 MG tablet Take 1 tablet (20 mg total) by mouth daily. 04/21/20   Cantwell, Celeste C, PA-C  ticagrelor (BRILINTA) 90 MG TABS tablet Take 1 tablet (90 mg total) by mouth 2 (two) times daily. 03/13/20   Cantwell, Gerline Legacy, PA-C    Family History Family History  Problem Relation Age of Onset   Heart disease Mother    Stroke Father    Hypertension Father    Heart disease Father    Diabetes Father     Social History Social History   Tobacco Use   Smoking status: Never   Smokeless tobacco: Never  Vaping Use   Vaping Use: Never used  Substance Use Topics   Alcohol use: No   Drug use: No     Allergies   Patient has no known allergies.   Review of Systems Review of Systems  Constitutional:  Negative for chills and fever.  HENT:  Positive for congestion, postnasal drip, rhinorrhea and sinus pressure. Negative for ear pain and sore throat.   Respiratory:  Positive for cough. Negative for shortness of breath.   Cardiovascular:  Negative for chest pain and palpitations.   Gastrointestinal:  Negative for diarrhea and vomiting.  Skin:  Negative for color change and rash.  All other systems reviewed and are negative.   Physical Exam Triage Vital Signs ED Triage Vitals [02/03/21 1717]  Enc Vitals Group     BP      Pulse      Resp      Temp      Temp src      SpO2      Weight      Height      Head Circumference      Peak Flow      Pain Score 0     Pain Loc      Pain Edu?      Excl. in [place]?  No data found.  Updated Vital Signs There were no vitals taken for this visit.  Visual Acuity Right Eye Distance:   Left Eye Distance:   Bilateral Distance:    Right Eye Near:   Left Eye Near:    Bilateral Near:     Physical Exam Vitals and nursing note reviewed.  Constitutional:      General: She is not in acute distress.    Appearance: She is well-developed. She is not ill-appearing.  HENT:     Right Ear: Tympanic membrane normal.     Left Ear: Tympanic membrane normal.     Nose: Congestion and rhinorrhea present.     Mouth/Throat:     Mouth: Mucous membranes are moist.     Pharynx: Oropharynx is clear.  Cardiovascular:     Rate and Rhythm: Normal rate and regular rhythm.     Heart sounds: Normal heart sounds.  Pulmonary:     Effort: Pulmonary effort is normal. No respiratory distress.     Breath sounds: Normal breath sounds.  Musculoskeletal:     Cervical back: Neck supple.  Skin:    General: Skin is warm and dry.  Neurological:     Mental Status: She is alert.  Psychiatric:        Mood and Affect: Mood normal.     UC Treatments / Results  Labs (all labs ordered are listed, but only abnormal results are displayed) Labs Reviewed - No data to display  EKG   Radiology No results found.  Procedures Procedures (including critical care time)  Medications Ordered in UC Medications - No data to display  Initial Impression / Assessment and Plan / UC Course  I have reviewed the triage vital signs and the nursing  notes.  Pertinent labs & imaging results that were available during my care of the patient were reviewed by me and considered in my medical decision making (see chart for details).    Acute sinusitis.  Patient has been symptomatic for >1 week and symptoms are getting worse.  Treating today with amoxicillin.  Discussed symptomatic treatment with plain Mucinex.  Patient declines PCR COVID test.  Instructed her to follow-up with her PCP if her symptoms are not improving.  She agrees to plan of care.  Final Clinical Impressions(s) / UC Diagnoses   Final diagnoses:  Acute non-recurrent maxillary sinusitis     Discharge Instructions      Take the amoxicillin as directed.  Follow up with your primary care provider if your symptoms are not improving.         ED Prescriptions     Medication Sig Dispense Auth. Provider   amoxicillin (AMOXIL) 875 MG tablet Take 1 tablet (875 mg total) by mouth 2 (two) times daily for 7 days. 14 tablet Sharion Balloon, NP      PDMP not reviewed this encounter.   Sharion Balloon, NP 02/03/21 1755

## 2021-02-03 NOTE — ED Triage Notes (Signed)
Pt c/o runny nose x 1 week she was taking OTC medication.She developed a cough last night. At home test negative.

## 2021-02-04 ENCOUNTER — Ambulatory Visit: Payer: PPO | Admitting: Family Medicine

## 2021-02-10 DIAGNOSIS — M25561 Pain in right knee: Secondary | ICD-10-CM | POA: Diagnosis not present

## 2021-02-10 DIAGNOSIS — M25551 Pain in right hip: Secondary | ICD-10-CM | POA: Diagnosis not present

## 2021-02-10 DIAGNOSIS — M545 Low back pain, unspecified: Secondary | ICD-10-CM | POA: Diagnosis not present

## 2021-02-16 ENCOUNTER — Telehealth: Payer: Self-pay

## 2021-02-16 NOTE — Chronic Care Management (AMB) (Signed)
Chronic Care Management Pharmacy Assistant   Name: Amy Lin  MRN: [id number] DOB: [birth year]  Amy Lin is an 88 y.o. year old female who presents for his initial CCM visit with the clinical pharmacist.  Reason for Encounter: Initial Questions   Conditions to be addressed/monitored: CAD, HTN, and HLD   Recent office visits:  11/30/20-PCP-Amy Cable,NP-Patient presented for AWV. Labs ordered,(looks good) discussed vaccines,discussed screenings, schedule DEXA scan, possible neurology referral,follow up 6 months  08/28/20-PCP-Amy Cable,NP-Patient presented for transfer of care.complaint of dizziness and sinus pressure.Labs ordered,(BNP elevated)recommend lasix on as needed basis ,chest xray,EKG-continue antihistamine.Start azithromycin 250mg ,follow up 3 months.  Recent consult visits:  02/03/21-[facility] presented for nasal congestion and cough.Home covid-19 test negative, start amoxicillin 875mg   1 tablet twice daily for 7 days, use mucinex for symptomatic treatment. 09/10/20-Cardiology-Amy Cantwell,PA-Patient presented for follow up heart disease.EKG,Recommend patient continue DAPT until at least 03/07/2021 if tolerated.  Hospital visits:  None in previous 6 months  Medications: Outpatient Encounter Medications as of 02/16/2021  Medication Sig   aspirin 81 MG EC tablet Take 81 mg by mouth daily.   carvedilol (COREG) 3.125 MG tablet Take 1 tablet (3.125 mg total) by mouth 2 (two) times daily.   cetirizine (ZYRTEC) 10 MG tablet Take 10 mg by mouth daily.   Cholecalciferol (VITAMIN D3) 2000 UNITS TABS Take 1 tablet by mouth every evening.   Cyanocobalamin (VITAMIN B 12 PO) Take 1 tablet by mouth every evening.   ezetimibe (ZETIA) 10 MG tablet TAKE 1 TABLET BY MOUTH ONCE A DAY AFTER SUPPER   furosemide (LASIX) 40 MG tablet Take 0.5 tablets (20 mg total) by mouth daily as needed for fluid or edema.   levothyroxine (SYNTHROID) 75 MCG  tablet TAKE 1 TABLET BY MOUTH ONCE A DAY ON AN EMPTY STOMACH.TAKE WITH A GLASS OF WATERAT LEAST 30-60 MIN BEFORE BREAKFAST   lisinopril (ZESTRIL) 5 MG tablet Take 1 tablet (5 mg total) by mouth daily.   nitroGLYCERIN (NITROSTAT) 0.4 MG SL tablet Place 1 tablet (0.4 mg total) under the tongue every 5 (five) minutes as needed for up to 25 days for chest pain.   pantoprazole (PROTONIX) 20 MG tablet TAKE 1 TABLET BY MOUTH TWICE A DAY   potassium chloride SA (KLOR-CON) 20 MEQ tablet Take 1 tablet (20 mEq total) by mouth daily as needed.   rosuvastatin (CRESTOR) 20 MG tablet Take 1 tablet (20 mg total) by mouth daily.   ticagrelor (BRILINTA) 90 MG TABS tablet Take 1 tablet (90 mg total) by mouth 2 (two) times daily.   No facility-administered encounter medications on file as of 02/16/2021.   Lab Results  Component Value Date/Time   HGBA1C 6.3 09/25/2019 02:58 PM   HGBA1C 6.2 11/11/2015 02:13 PM     BP Readings from Last 3 Encounters:  11/30/20 136/78  09/10/20 135/66  08/28/20 102/64    Patient contacted to review initial questions prior to visit with Amy Lin.  Have you seen any other providers since your last visit with PCP? Yes  Urgent Care, short course of antibiotics.   Any changes in your medications or health? No  Any side effects from any medications? No  Do you have an symptoms or problems not managed by your medications? No  Any concerns about your health right now? No  Has your provider asked that you check blood pressure, blood sugar, or follow special diet at home? Yes  The patient can monitor BP at home.  Do you get any type of exercise on a regular basis?  She will go to physical therapy and will do HEP.  Can you think of a goal you would like to reach for your health? No  Do you have any problems getting your medications? No  Is there anything that you would like to discuss during the appointment? No   Spoke with patient and reminded them to have all  medications, supplements and any blood glucose and blood pressure readings available for review with pharmacist, at their telephone visit on 03/17/21 at 11:00am.   Star Rating Drugs:  Medication:  Last Fill: Day Supply Lisinopril 5mg   12/6/.22 90 Rosuvastatin 20mg  02/05/21  90   Care Gaps: Annual wellness visit in last year? Yes Most Recent BP reading:136/78  71-P  10//31/22   Amy Lin, CPP notified  Amy Lin, [place] Assistant [phone]  Total time spent for month CPA: 40 min.

## 2021-02-22 ENCOUNTER — Telehealth: Payer: PPO

## 2021-03-05 DIAGNOSIS — I2510 Atherosclerotic heart disease of native coronary artery without angina pectoris: Secondary | ICD-10-CM | POA: Diagnosis not present

## 2021-03-06 ENCOUNTER — Other Ambulatory Visit: Payer: Self-pay | Admitting: Student

## 2021-03-06 LAB — BASIC METABOLIC PANEL
BUN/Creatinine Ratio: 26 (ref 12–28)
BUN: 27 mg/dL (ref 8–27)
CO2: 26 mmol/L (ref 20–29)
Calcium: 9.8 mg/dL (ref 8.7–10.3)
Chloride: 101 mmol/L (ref 96–106)
Creatinine, Ser: 1.03 mg/dL — ABNORMAL HIGH (ref 0.57–1.00)
Glucose: 121 mg/dL — ABNORMAL HIGH (ref 70–99)
Potassium: 4.4 mmol/L (ref 3.5–5.2)
Sodium: 139 mmol/L (ref 134–144)
eGFR: 53 mL/min/{1.73_m2} — ABNORMAL LOW (ref >59)

## 2021-03-06 LAB — BRAIN NATRIURETIC PEPTIDE: BNP: 144.8 pg/mL — ABNORMAL HIGH (ref 0.0–100.0)

## 2021-03-12 ENCOUNTER — Telehealth: Payer: Self-pay

## 2021-03-12 NOTE — Progress Notes (Signed)
° ° °  Chronic Care Management Pharmacy Assistant   Name: Amy Lin  MRN: [id number] DOB: [birth year]  Reason for Encounter: CCM (Appointment Reminder)   Medications: Outpatient Encounter Medications as of 03/12/2021  Medication Sig   aspirin 81 MG EC tablet Take 81 mg by mouth daily.   carvedilol (COREG) 3.125 MG tablet Take 1 tablet (3.125 mg total) by mouth 2 (two) times daily.   cetirizine (ZYRTEC) 10 MG tablet Take 10 mg by mouth daily.   Cholecalciferol (VITAMIN D3) 2000 UNITS TABS Take 1 tablet by mouth every evening.   Cyanocobalamin (VITAMIN B 12 PO) Take 1 tablet by mouth every evening.   ezetimibe (ZETIA) 10 MG tablet TAKE 1 TABLET BY MOUTH ONCE A DAY AFTER SUPPER   furosemide (LASIX) 40 MG tablet Take 0.5 tablets (20 mg total) by mouth daily as needed for fluid or edema.   levothyroxine (SYNTHROID) 75 MCG tablet TAKE 1 TABLET BY MOUTH ONCE A DAY ON AN EMPTY STOMACH.TAKE WITH A GLASS OF WATERAT LEAST 30-60 MIN BEFORE BREAKFAST   lisinopril (ZESTRIL) 5 MG tablet Take 1 tablet (5 mg total) by mouth daily.   nitroGLYCERIN (NITROSTAT) 0.4 MG SL tablet Place 1 tablet (0.4 mg total) under the tongue every 5 (five) minutes as needed for up to 25 days for chest pain.   pantoprazole (PROTONIX) 20 MG tablet TAKE 1 TABLET BY MOUTH TWICE A DAY   potassium chloride SA (KLOR-CON) 20 MEQ tablet Take 1 tablet (20 mEq total) by mouth daily as needed.   rosuvastatin (CRESTOR) 20 MG tablet Take 1 tablet (20 mg total) by mouth daily.   ticagrelor (BRILINTA) 90 MG TABS tablet Take 1 tablet (90 mg total) by mouth 2 (two) times daily.   No facility-administered encounter medications on file as of 03/12/2021.   Amy Lin was contacted to remind of upcoming telephone visit with [name] on 03/17/2021 at 11:00 am. Patient was reminded to have all medications, supplements and any blood glucose and blood pressure readings available for review at appointment. If unable to reach, a voicemail was  left for patient.   Star Rating Drugs: Medication:  Last Fill: Day Supply Lisinopril 5 mg  01/05/2021 90 Rosuvastatin 20 mg 02/05/2021 [place], CPP notified  Marijean Niemann, [facility] Assistant [phone]  Time Spent: 10 Minutes

## 2021-03-15 ENCOUNTER — Other Ambulatory Visit: Payer: PPO

## 2021-03-16 ENCOUNTER — Ambulatory Visit: Payer: PPO

## 2021-03-16 ENCOUNTER — Other Ambulatory Visit: Payer: Self-pay

## 2021-03-16 DIAGNOSIS — I252 Old myocardial infarction: Secondary | ICD-10-CM

## 2021-03-17 ENCOUNTER — Ambulatory Visit (INDEPENDENT_AMBULATORY_CARE_PROVIDER_SITE_OTHER): Payer: PPO | Admitting: Pharmacist

## 2021-03-17 DIAGNOSIS — I10 Essential (primary) hypertension: Secondary | ICD-10-CM

## 2021-03-17 DIAGNOSIS — E782 Mixed hyperlipidemia: Secondary | ICD-10-CM

## 2021-03-17 DIAGNOSIS — E039 Hypothyroidism, unspecified: Secondary | ICD-10-CM

## 2021-03-17 DIAGNOSIS — M810 Age-related osteoporosis without current pathological fracture: Secondary | ICD-10-CM

## 2021-03-17 DIAGNOSIS — I2510 Atherosclerotic heart disease of native coronary artery without angina pectoris: Secondary | ICD-10-CM

## 2021-03-17 DIAGNOSIS — J301 Allergic rhinitis due to pollen: Secondary | ICD-10-CM

## 2021-03-17 DIAGNOSIS — K2100 Gastro-esophageal reflux disease with esophagitis, without bleeding: Secondary | ICD-10-CM

## 2021-03-17 NOTE — Progress Notes (Signed)
Chronic Care Management Pharmacy Note  03/19/2021 Name:  Amy Lin MRN:  [id number] DOB:  12-Jun-1933  Summary: CCM Initial visit -Pt is thinking about starting to take furosemide 40 mg daily, she has previously been using 1/2 tab PRN and uses rarely; she does report when she takes 1/2 tab it does NOT cause urination; discussed sudden increase in furosemide use can lead to kidney issues or electrolyte disturbances -Pt has repeat DEXA scan in April; previously FRAX showed high risk for fracture (37% major fracture, 27% hip fracture); she has been on Fosamax x 5 years and Reclast x 2 years previously  Recommendations/Changes made from today's visit: -Advised she can start furosemide 1/2 tab daily; advised she discuss this at upcoming cardiology appt -Consider Prolia depending on upcoming DEXA results  Follow up: -Protection will call patient 3 months for general adherence review -Pharmacist follow up televisit scheduled for 6 months -Cardiology 6-mo f/u 03/23/21 -PCP 6-mo f/u 05/24/21   Subjective: Amy Lin is an 88 y.o. year old female who is a primary patient of Michela Pitcher, NP.  The CCM team was consulted for assistance with disease management and care coordination needs.    Engaged with patient by telephone for initial visit in response to provider referral for pharmacy case management and/or care coordination services.   Consent to Services:  The patient was given the following information about Chronic Care Management services today, agreed to services, and gave verbal consent: 1. CCM service includes personalized support from designated clinical staff supervised by the primary care provider, including individualized plan of care and coordination with other care providers 2. 24/7 contact phone numbers for assistance for urgent and routine care needs. 3. Service will only be billed when office clinical staff spend 20 minutes or more in a month to coordinate care. 4.  Only one practitioner may furnish and bill the service in a calendar month. 5.The patient may stop CCM services at any time (effective at the end of the month) by phone call to the office staff. 6. The patient will be responsible for cost sharing (co-pay) of up to 20% of the service fee (after annual deductible is met). Patient agreed to services and consent obtained.  Patient Care Team: Michela Pitcher, NP as PCP - General (Pain Medicine) Charlton Haws, [facility] as Pharmacist (Pharmacist)  Recent office visits: 11/30/20-PCP-[name],NP-Patient presented for AWV. Labs ordered,(looks good) discussed vaccines,discussed screenings, schedule DEXA scan, possible neurology referral,follow up 6 months   08/28/20-PCP-[name],NP-Patient presented for transfer of care.complaint of SOB and sinus pressure.Labs ordered,(BNP elevated) Recommend lasix on as needed basis ,chest xray,EKG-continue antihistamine.Start azithromycin 250mg ,follow up 3 months.  Recent consult visits: 02/03/21-[facility] [place] presented for nasal congestion and cough.Home covid-19 test negative, start amoxicillin 875mg   1 tablet twice daily for 7 days, use mucinex for symptomatic treatment.  09/10/20-Cardiology-[name],PA-Patient presented for follow up heart disease.EKG,Recommend patient continue DAPT until at least 03/07/2021 if tolerated.  Hospital visits: None in previous 6 months   Objective:  Lab Results  Component Value Date   CREATININE 1.03 (H) 03/05/2021   BUN 27 03/05/2021   GFR 54.21 (L) 11/30/2020   EGFR 53 (L) 03/05/2021   GFRNONAA 47 (L) 03/07/2020   GFRAA 91 03/12/2008   NA 139 03/05/2021   K 4.4 03/05/2021   CALCIUM 9.8 03/05/2021   CO2 26 03/05/2021   GLUCOSE 121 (H) 03/05/2021    Lab Results  Component Value Date/Time  HGBA1C 6.3 09/25/2019 02:58 PM   HGBA1C 6.2 11/11/2015 02:13 PM   GFR 54.21 (L) 11/30/2020 11:01 AM   GFR 52.98 (L) 08/28/2020 11:04  AM    Last diabetic Eye exam: No results found for: HMDIABEYEEXA  Last diabetic Foot exam: No results found for: HMDIABFOOTEX   Lab Results  Component Value Date   CHOL 131 11/30/2020   HDL 44.70 11/30/2020   LDLCALC 64 11/30/2020   LDLDIRECT 156.0 11/06/2014   TRIG 112.0 11/30/2020   CHOLHDL 3 11/30/2020    Hepatic Function Latest Ref Rng & Units 11/30/2020 08/28/2020 03/05/2020  Total Protein 6.0 - 8.3 g/dL 7.1 6.6 6.8  Albumin 3.5 - 5.2 g/dL 3.9 3.6 3.4(L)  AST 0 - 37 U/L $Remo'16 14 22  'egdUP$ ALT 0 - 35 U/L $Remo'10 11 16  'LxCLd$ Alk Phosphatase 39 - 117 U/L 54 54 51  Total Bilirubin 0.2 - 1.2 mg/dL 0.6 0.6 0.7  Bilirubin, Direct 0.0 - 0.3 mg/dL - - -    Lab Results  Component Value Date/Time   TSH 0.39 11/30/2020 11:01 AM   TSH 1.23 08/28/2020 11:04 AM   FREET4 1.03 11/30/2020 11:01 AM   FREET4 1.02 09/25/2019 02:58 PM    CBC Latest Ref Rng & Units 11/30/2020 08/28/2020 03/07/2020  WBC 4.0 - 10.5 K/uL 7.9 8.6 7.9  Hemoglobin 12.0 - 15.0 g/dL 12.1 12.2 11.7(L)  Hematocrit 36.0 - 46.0 % 36.4 36.3 35.6(L)  Platelets 150.0 - 400.0 K/uL 183.0 150.0 151    Lab Results  Component Value Date/Time   VD25OH 47.50 09/25/2019 02:58 PM   VD25OH 45.40 09/13/2018 12:12 PM    Clinical ASCVD: Yes  The ASCVD Risk score (Arnett DK, et al., 2019) failed to calculate for the following reasons:   The 2019 ASCVD risk score is only valid for ages 61 to 52   The patient has a prior MI or stroke diagnosis    Depression screen [facility] 2/9 11/30/2020 06/02/2020 04/28/2020  Decreased Interest 0 0 1  Down, Depressed, Hopeless 0 0 0  PHQ - 2 Score 0 0 1  Altered sleeping - 0 0  Tired, decreased energy - 1 2  Change in appetite - 1 0  Feeling bad or failure about yourself  - 0 0  Trouble concentrating - 0 2  Moving slowly or fidgety/restless - 0 0  Suicidal thoughts - 0 0  PHQ-9 Score - 2 5  Difficult doing work/chores - Somewhat difficult Somewhat difficult  Some recent data might be hidden     Social History    Tobacco Use  Smoking Status Never  Smokeless Tobacco Never   BP Readings from Last 3 Encounters:  11/30/20 136/78  09/10/20 135/66  08/28/20 102/64   Pulse Readings from Last 3 Encounters:  11/30/20 71  09/10/20 65  08/28/20 72   Wt Readings from Last 3 Encounters:  11/30/20 147 lb 3 oz (66.8 kg)  09/10/20 149 lb 3.2 oz (67.7 kg)  08/28/20 146 lb (66.2 kg)   BMI Readings from Last 3 Encounters:  11/30/20 25.66 kg/m  09/10/20 26.02 kg/m  08/28/20 25.46 kg/m    Assessment/Interventions: Review of patient past medical history, allergies, medications, health status, including review of consultants reports, laboratory and other test data, was performed as part of comprehensive evaluation and provision of chronic care management services.   SDOH:  (Social Determinants of Health) assessments and interventions performed: Yes SDOH Interventions    Flowsheet Row Most Recent Value  SDOH Interventions   Food  Insecurity Interventions Intervention Not Indicated  Financial Strain Interventions Intervention Not Indicated      SDOH Screenings   Alcohol Screen: Not on file  Depression (PHQ2-9): Low Risk    PHQ-2 Score: 0  Financial Resource Strain: Low Risk    Difficulty of Paying Living Expenses: Not hard at all  Food Insecurity: No Food Insecurity   Worried About Charity fundraiser in the Last Year: Never true   Ran Out of Food in the Last Year: Never true  Housing: Not on file  Physical Activity: Not on file  Social Connections: Not on file  Stress: Not on file  Tobacco Use: Low Risk    Smoking Tobacco Use: Never   Smokeless Tobacco Use: Never   Passive Exposure: Not on file  Transportation Needs: Not on file    [place]  No Known Allergies  Medications Reviewed Today     Reviewed by Charlton Haws, [facility] (Pharmacist) on 03/17/21 at 1157  Med List Status: <None>   Medication Order Taking? Sig Documenting Provider Last Dose Status Informant  aspirin  81 MG EC tablet [id number] Yes Take 81 mg by mouth daily. [provider] Taking Active Self  carvedilol (COREG) 3.125 MG tablet [id number] Yes Take 1 tablet (3.125 mg total) by mouth 2 (two) times daily. Cantwell, Celeste C, PA-C Taking Active   cetirizine (ZYRTEC) 10 MG tablet [id number] Yes Take 10 mg by mouth daily. [provider] Taking Active Self  Cholecalciferol (VITAMIN D3) 2000 UNITS TABS [id number] Yes Take 1 tablet by mouth every evening. [provider] Taking Active Self  Cyanocobalamin (VITAMIN B 12 PO) [id number] Yes Take 1 tablet by mouth every evening. [provider] Taking Active Self  ezetimibe (ZETIA) 10 MG tablet [id number] Yes TAKE 1 TABLET BY MOUTH ONCE A DAY AFTER SUPPER Cantwell, Celeste C, PA-C Taking Active   furosemide (LASIX) 40 MG tablet [id number] Yes Take 0.5 tablets (20 mg total) by mouth daily as needed for fluid or edema. Cantwell, Celeste C, PA-C Taking Active   levothyroxine (SYNTHROID) 75 MCG tablet [id number] Yes TAKE 1 TABLET BY MOUTH ONCE A DAY ON AN EMPTY STOMACH.TAKE WITH A GLASS OF WATERAT LEAST 30-60 MIN BEFORE BREAKFAST Michela Pitcher, NP Taking Active   lisinopril (ZESTRIL) 5 MG tablet [id number] Yes Take 1 tablet (5 mg total) by mouth daily. Cantwell, Celeste C, PA-C Taking Active   nitroGLYCERIN (NITROSTAT) 0.4 MG SL tablet [id number]  Place 1 tablet (0.4 mg total) under the tongue every 5 (five) minutes as needed for up to 25 days for chest pain. Adrian Prows, MD  Expired 09/10/20 2359   pantoprazole (PROTONIX) 20 MG tablet [id number] Yes TAKE 1 TABLET BY MOUTH TWICE A DAY Michela Pitcher, NP Taking Active   potassium chloride SA (KLOR-CON) 20 MEQ tablet [id number] Yes Take 1 tablet (20 mEq total) by mouth daily as needed. Cantwell, Celeste C, PA-C Taking Active   rosuvastatin (CRESTOR) 20 MG tablet [id number] Yes Take 1 tablet (20 mg total) by mouth daily. Cantwell, Celeste C, PA-C Taking Active   ticagrelor (BRILINTA) 90 MG TABS tablet  [id number] Yes Take 1 tablet (90 mg total) by mouth 2 (two) times daily. Alethia Berthold, PA-C Taking Active             Patient Active Problem List   Diagnosis Date Noted   Medicare annual wellness visit, subsequent 11/30/2020   Paresthesia 11/30/2020   Acute pain of left shoulder 11/30/2020   Dyspnea  on exertion 08/28/2020   Rhinorrhea 08/28/2020   Sinus pressure 08/28/2020   Unstable angina (HCC)    Acute inferior myocardial infarction ([place]) 03/05/2020   CAD (coronary artery disease), native coronary artery 03/05/2020   Osteoarthritis 07/20/2017   Seasonal allergies 11/01/2007   Hyperlipemia 09/06/2006   Hypothyroidism 08/24/2006   Essential hypertension 08/24/2006   GERD 08/24/2006   Osteoporosis 08/24/2006    Immunization History  Administered Date(s) Administered   Fluad Quad(high Dose 65+) 11/04/2019   Influenza Split 11/01/2010, 10/31/2011   Influenza Whole 01/31/2005, 11/01/2007, 12/04/2009   Influenza, High Dose Seasonal PF 11/15/2013, 10/24/2017, 10/12/2018, 11/04/2020   Influenza,inj,Quad PF,6+ Mos 10/16/2012, 11/06/2014, 11/11/2015, 11/01/2016, 10/24/2017   Influenza-Unspecified 11/09/2016   PFIZER(Purple Top)SARS-COV-2 Vaccination 02/17/2019, 03/10/2019, 02/11/2020   Pneumococcal Conjugate-13 05/07/2014   Pneumococcal Polysaccharide-23 01/31/2006   Td 01/31/2006   Zoster, Live 08/08/2013    Conditions to be addressed/monitored:  Hypertension, Hyperlipidemia, Coronary Artery Disease, GERD, Hypothyroidism, and Osteoporosis  Care Plan : [place]  Updates made by Charlton Haws, [place] since 03/19/2021 12:00 AM     Problem: Hypertension, Hyperlipidemia, Coronary Artery Disease, GERD, Hypothyroidism, and Osteoporosis   Priority: High     Long-Range Goal: Disease mgmt   Start Date: 03/18/2021  Expected End Date: 03/19/2022  This Visit's Progress: On track  Priority: High  Note:   Current Barriers:  Unable to independently monitor  therapeutic efficacy  Pharmacist Clinical Goal(s):  Patient will achieve adherence to monitoring guidelines and medication adherence to achieve therapeutic efficacy through collaboration with PharmD and provider.   Interventions: 1:1 collaboration with Michela Pitcher, NP regarding development and update of comprehensive plan of care as evidenced by provider attestation and co-signature Inter-disciplinary care team collaboration (see longitudinal plan of care) Comprehensive medication review performed; medication list updated in electronic medical record  Hypertension (BP goal <140/90) -Controlled - pt has hx of low BP, had to adjust medications to improve -pt has been "slacking off' on furosemide lately, had been judging it based on leg swelling which has been minimal, so she has not been taking it, but has been thinking about starting to take it daily -Current home readings: 142/67, 115/61, 132/62, 126/63 -Current treatment: Carvedilol 3.125 mg BID - Appropriate, Effective, Safe, Accessible Lisinopril 5 mg daily -Appropriate, Effective, Safe, Accessible Furosemide 40 mg - 1/2 tab daily PRN - Appropriate, Effective, Safe, Accessible Klor Con 20 mEq daily PRN (w/ Lasix) -Appropriate, Effective, Safe, Accessible -Medications previously tried: Nutritional therapist  -Current dietary habits: tries to limit salt -Current exercise habits: walks on treadmill ~1 mile -Educated on BP goals and benefits of medications for prevention of heart attack, stroke and kidney damage; Daily salt intake goal < 2300 mg; Importance of home blood pressure monitoring; -Discussed furosemide can be taken just as needed, there is no need to take it every day if she is not having symptoms of fluid overload - LE swelling or Shortness of breath; she denies significant issues with swelling, but has had shortness of breath however this may be due to recovering from respiratory illness last month; discussed issues with  kidneys/potassium if furosemide is overused -Counseled to monitor BP at home daily, document, and provide log at future appointments -Recommended to continue current medication; advised she can take 1/2 tab furosemide daily but advised to discuss with cardiology at upcoming appt  Hyperlipidemia / CAD (LDL goal < 70) -Controlled - LDL improved 114 > 64 after STEMI and adding ezetimibe; she has used NTG once but thinks  it was actually heartburn -Hx CAD - STEMI 03/2020 s/p stent (Rec'd DAPT through at least 03/07/21) -Current treatment: Rosuvastatin 20 mg daily AM -Appropriate, Effective, Safe, Accessible Ezetimibe 10 mg daily - Appropriate, Effective, Safe, Accessible Nitroglycerin 0.4 mg SL prn -Appropriate, Effective, Safe, Accessible Brilinta 90 mg BID -Appropriate, Effective, Safe, Accessible Aspirin 81 mg daily -Appropriate, Effective, Safe, Accessible -Medications previously tried: n/a  -Educated on Cholesterol goals; Benefits of statin for ASCVD risk reduction; -Discussed she will likely come off of Brilinta this month after cardiology f/u; advised she keep taking Brilinta until cardiologist tells her to stop -Recommended to continue current medication  Osteoporosis (Goal prevent fractures) -Not ideally controlled - per last FRAX score pt is at high risk for major fracture and hip fracture; she has been on on Reclast and Fosamax before -Last DEXA Scan: 10/01/2018. Next scan scheduled 05/12/21. Considering Prolia.  T-Score femoral neck: -2.5  T-Score total hip: -1.6  T-Score forearm radius: -2.1  10-year probability of major osteoporotic fracture: 37.3%  10-year probability of hip fracture: 27% -Patient is a candidate for pharmacologic treatment due to T-Score -1.0 to -2.5 and 10-year risk of major osteoporotic fracture > 20% and T-Score -1.0 to -2.5 and 10-year risk of hip fracture > 3% -Current treatment  Vitamin D 2000 IU -Medications previously tried: zoledronic acid (x 2 doses 2014,  2015), alendronate > 5 years  -Recommend [phone] units of vitamin D daily. Recommend 1200 mg of calcium daily from dietary and supplemental sources. Recommend weight-bearing and muscle strengthening exercises for building and maintaining bone density. -Consider Prolia depending on results of upcoming DEXA scan (April 2023)  Hypothyroidism (Goal: maintain TSH in goal range) -Controlled - TSH is low-normal; she takes levothyroxine first thing in AM separate from other meds -Current treatment  Levothyroxine 75 mcg daily - Appropriate, Effective, Safe, Accessible -Recommended to continue current medication  GERD (Goal: manage symptoms) -Controlled - pt reports taking BID for over a year; she is concerned if she reduces it she will not be able to tell difference between GERD and angina -Current treatment  Pantoprazole 20 mg BID - Appropriate, Effective, Query Safe -PPI can worsen bone density, however given past episodes of chest pain that were determine to be GERD-related, it is more prudent to continue PPI and monitor bone density (repeat DEXA upcoming) -Recommended to continue current medication  Health Maintenance -Vaccine gaps: Shingrix, TDAP, covid booster -has been on cetirizine for years and reports persistent issues with sinus congestion, she is not able to use decongestants due to HTN; advised to try another H2RA (Claritin or Allegra) -Current therapy:  Vitamin B12 Cetirizine 10 mg daily Tylenol 650 mg Coricidin Cetirizine 10 mg daily Saline nasal spray -Patient is satisfied with current therapy and denies issues -Recommended to continue current medication  Patient Goals/Self-Care Activities Patient will:  - take medications as prescribed as evidenced by patient report and record review focus on medication adherence by pill box check blood pressure daily, document, and provide at future appointments      Medication Assistance: None required.  Patient affirms current  coverage meets needs.  Compliance/Adherence/Medication fill history: Care Gaps: NONE  Star-Rating Drugs: Lisinopril - LF 01/05/21 x 90 ds; [facility] 88% Rosuvastatin - LF 02/05/21 x 90 ds; [place] 93%  Patient's preferred pharmacy is:  [facility], [address] [place] [address] Phone: [phone] Fax: [phone]  Uses pill box? Yes - AM and PM pill boxes Pt endorses 100% compliance  We discussed: Current pharmacy is preferred with insurance plan and patient is satisfied with pharmacy services Patient decided to: Continue current medication management strategy  Care Plan and Follow Up Patient Decision:  Patient agrees to Care Plan and Follow-up.  Plan: Telephone follow up appointment with care management team member scheduled for:  6 months  [name], PharmD, BCACP Clinical Pharmacist [facility] at [facility] [phone]

## 2021-03-19 NOTE — Patient Instructions (Addendum)
Visit Information  Phone number for Pharmacist: [phone]  Thank you for meeting with me to discuss your medications! I look forward to working with you to achieve your health care goals. Below is a summary of what we talked about during the visit:   Goals Addressed             This Visit's Progress    Manage My Medicine       Timeframe:  Long-Range Goal Priority:  Medium Start Date:     03/18/21                        Expected End Date: 03/18/22                      Follow Up Date August 2023   - call for medicine refill 2 or 3 days before it runs out - call if I am sick and can't take my medicine - keep a list of all the medicines I take; vitamins and herbals too - use a pillbox to sort medicine    Why is this important?   These steps will help you keep on track with your medicines.   Notes:         Care Plan : [place]  Updates made by Charlton Haws, RPH since 03/19/2021 12:00 AM     Problem: Hypertension, Hyperlipidemia, Coronary Artery Disease, GERD, Hypothyroidism, and Osteoporosis   Priority: High     Long-Range Goal: Disease mgmt   Start Date: 03/18/2021  Expected End Date: 03/19/2022  This Visit's Progress: On track  Priority: High  Note:   Current Barriers:  Unable to independently monitor therapeutic efficacy  Pharmacist Clinical Goal(s):  Patient will achieve adherence to monitoring guidelines and medication adherence to achieve therapeutic efficacy through collaboration with PharmD and provider.   Interventions: 1:1 collaboration with Michela Pitcher, NP regarding development and update of comprehensive plan of care as evidenced by provider attestation and co-signature Inter-disciplinary care team collaboration (see longitudinal plan of care) Comprehensive medication review performed; medication list updated in electronic medical record  Hypertension (BP goal <140/90) -Controlled - pt has hx of low BP, had to adjust  medications to improve -pt has been "slacking off' on furosemide lately, had been judging it based on leg swelling which has been minimal, so she has not been taking it, but has been thinking about starting to take it daily -Current home readings: 142/67, 115/61, 132/62, 126/63 -Current treatment: Carvedilol 3.125 mg BID - Appropriate, Effective, Safe, Accessible Lisinopril 5 mg daily -Appropriate, Effective, Safe, Accessible Furosemide 40 mg - 1/2 tab daily PRN - Appropriate, Effective, Safe, Accessible Klor Con 20 mEq daily PRN (w/ Lasix) -Appropriate, Effective, Safe, Accessible -Medications previously tried: Nutritional therapist  -Current dietary habits: tries to limit salt -Current exercise habits: walks on treadmill ~1 mile -Educated on BP goals and benefits of medications for prevention of heart attack, stroke and kidney damage; Daily salt intake goal < 2300 mg; Importance of home blood pressure monitoring; -Discussed furosemide can be taken just as needed, there is no need to take it every day if she is not having symptoms of fluid overload - LE swelling or Shortness of breath; she denies significant issues with swelling, but has had shortness of breath however this may be due to recovering from respiratory illness last month; discussed issues with kidneys/potassium if furosemide is overused -Counseled to monitor BP at home daily, document, and  provide log at future appointments -Recommended to continue current medication; advised she can take 1/2 tab furosemide daily but advised to discuss with cardiology at upcoming appt  Hyperlipidemia / CAD (LDL goal < 70) -Controlled - LDL improved 114 > 64 after STEMI and adding ezetimibe; she has used NTG once but thinks it was actually heartburn -Hx CAD - STEMI 03/2020 s/p stent (Rec'd DAPT through at least 03/07/21) -Current treatment: Rosuvastatin 20 mg daily AM -Appropriate, Effective, Safe, Accessible Ezetimibe 10 mg daily - Appropriate, Effective,  Safe, Accessible Nitroglycerin 0.4 mg SL prn -Appropriate, Effective, Safe, Accessible Brilinta 90 mg BID -Appropriate, Effective, Safe, Accessible Aspirin 81 mg daily -Appropriate, Effective, Safe, Accessible -Medications previously tried: n/a  -Educated on Cholesterol goals; Benefits of statin for ASCVD risk reduction; -Discussed she will likely come off of Brilinta this month after cardiology f/u; advised she keep taking Brilinta until cardiologist tells her to stop -Recommended to continue current medication  Osteoporosis (Goal prevent fractures) -Not ideally controlled - per last FRAX score pt is at high risk for major fracture and hip fracture; she has been on on Reclast and Fosamax before -Last DEXA Scan: 10/01/2018. Next scan scheduled 05/12/21. Considering Prolia.  T-Score femoral neck: -2.5  T-Score total hip: -1.6  T-Score forearm radius: -2.1  10-year probability of major osteoporotic fracture: 37.3%  10-year probability of hip fracture: 27% -Patient is a candidate for pharmacologic treatment due to T-Score -1.0 to -2.5 and 10-year risk of major osteoporotic fracture > 20% and T-Score -1.0 to -2.5 and 10-year risk of hip fracture > 3% -Current treatment  Vitamin D 2000 IU -Medications previously tried: zoledronic acid (x 2 doses 2014, 2015), alendronate > 5 years  -Recommend [phone] units of vitamin D daily. Recommend 1200 mg of calcium daily from dietary and supplemental sources. Recommend weight-bearing and muscle strengthening exercises for building and maintaining bone density. -Consider Prolia depending on results of upcoming DEXA scan (April 2023)  Hypothyroidism (Goal: maintain TSH in goal range) -Controlled - TSH is low-normal; she takes levothyroxine first thing in AM separate from other meds -Current treatment  Levothyroxine 75 mcg daily - Appropriate, Effective, Safe, Accessible -Recommended to continue current medication  GERD (Goal: manage symptoms) -Controlled  - pt reports taking BID for over a year; she is concerned if she reduces it she will not be able to tell difference between GERD and angina -Current treatment  Pantoprazole 20 mg BID - Appropriate, Effective, Query Safe -PPI can worsen bone density, however given past episodes of chest pain that were determine to be GERD-related, it is more prudent to continue PPI and monitor bone density (repeat DEXA upcoming) -Recommended to continue current medication  Health Maintenance -Vaccine gaps: Shingrix, TDAP, covid booster -has been on cetirizine for years and reports persistent issues with sinus congestion, she is not able to use decongestants due to HTN; advised to try another H2RA (Claritin or Allegra) -Current therapy:  Vitamin B12 Cetirizine 10 mg daily Tylenol 650 mg Coricidin Cetirizine 10 mg daily Saline nasal spray -Patient is satisfied with current therapy and denies issues -Recommended to continue current medication  Patient Goals/Self-Care Activities Patient will:  - take medications as prescribed as evidenced by patient report and record review focus on medication adherence by pill box check blood pressure daily, document, and provide at future appointments      Ms. Lessig was given information about Chronic Care Management services today including:  CCM service includes personalized support from designated clinical staff supervised by her physician,  including individualized plan of care and coordination with other care providers 24/7 contact phone numbers for assistance for urgent and routine care needs. Standard insurance, coinsurance, copays and deductibles apply for chronic care management only during months in which we provide at least 20 minutes of these services. Most insurances cover these services at 100%, however patients may be responsible for any copay, coinsurance and/or deductible if applicable. This service may help you avoid the need for more expensive face-to-face  services. Only one practitioner may furnish and bill the service in a calendar month. The patient may stop CCM services at any time (effective at the end of the month) by phone call to the office staff.  Patient agreed to services and verbal consent obtained.   Patient verbalizes understanding of instructions and care plan provided today and agrees to view in [place]. Active MyChart status confirmed with patient.   Telephone follow up appointment with pharmacy team member scheduled for: 6 months  [name], PharmD, [facility] [place] Clinical Pharmacist [facility] [phone]

## 2021-03-23 ENCOUNTER — Other Ambulatory Visit: Payer: Self-pay

## 2021-03-23 ENCOUNTER — Ambulatory Visit: Payer: PPO | Admitting: Student

## 2021-03-23 ENCOUNTER — Encounter: Payer: Self-pay | Admitting: Student

## 2021-03-23 VITALS — BP 134/71 | HR 73 | Temp 96.0°F | Resp 17 | Ht 63.5 in | Wt 149.2 lb

## 2021-03-23 DIAGNOSIS — I2510 Atherosclerotic heart disease of native coronary artery without angina pectoris: Secondary | ICD-10-CM

## 2021-03-23 DIAGNOSIS — I10 Essential (primary) hypertension: Secondary | ICD-10-CM

## 2021-03-23 DIAGNOSIS — E7800 Pure hypercholesterolemia, unspecified: Secondary | ICD-10-CM

## 2021-03-23 MED ORDER — POTASSIUM CHLORIDE CRYS ER 10 MEQ PO TBCR: 10.0000 meq | EXTENDED_RELEASE_TABLET | Freq: Every day | ORAL | 3 refills | Status: DC

## 2021-03-23 MED ORDER — FUROSEMIDE 20 MG PO TABS: 20.0000 mg | ORAL_TABLET | Freq: Every day | ORAL | 3 refills | Status: DC | PRN

## 2021-03-23 NOTE — Progress Notes (Signed)
Primary Physician/Referring:  Michela Pitcher, NP  Patient ID: Amy Lin, female    DOB: 11-05-1933, 88 y.o.   MRN: [id number]  Chief Complaint  Patient presents with   Coronary Artery Disease    6 MONTHS   HLD   hx of STEMI   HPI:    Amy Lin  is a 88 y.o. Caucasian female with history of hypertension, hyperlipidemia, GERD who presented to [facility] emergency department 03/05/2020 with active chest pain and EKG revealing inferior STEMI.  Underwent successful angioplasty and stenting to proximal RCA, with subsequent staged intervention to OM1.  Recommend patient continue DAPT until at least 03/07/2021 if tolerated.   Patient presents for 86-monthfollow-up.monthfollow-up.  Patient is feeling well overall, denies chest pain, orthopnea, leg edema.  However she does state she had stopped taking Lasix regularly at some point in the last few months and noticed over the last 2 weeks some mild dyspnea on exertion, therefore she is not taking Lasix 20 mg every other day, and dyspnea on exertion has improved.  Patient was previously tolerating Lasix 20 mg once daily without issue.  Past Medical History:  Diagnosis Date   Allergy    Coronary artery disease    GERD (gastroesophageal reflux disease)    Hyperlipidemia    Hypertension    diuretic for edema at first   Hypothyroidism    Myocardial infarction ([facility]    Osteoarthritis, multiple sites    Osteoporosis    Past Surgical History:  Procedure Laterality Date   ABDOMINAL HYSTERECTOMY     BACK SURGERY  1999   Discectomy and Ray cage   BLADDER REPAIR  ~2010   CARDIAC CATHETERIZATION  03/05/2020   CARPAL TUNNEL RELEASE  8/15   COLONOSCOPY W/ BIOPSIES N/A 2016   CORONARY STENT INTERVENTION N/A 03/06/2020   Procedure: CORONARY STENT INTERVENTION;  Surgeon: GAdrian Prows MD;  [facility];  Service: Cardiovascular;  Laterality: N/A;   CORONARY THROMBECTOMY N/A 03/05/2020   Procedure: Coronary Thrombectomy;  Surgeon: GAdrian Prows MD;   [facility];  Service: Cardiovascular;  Laterality: N/A;   CORONARY/GRAFT ACUTE MI REVASCULARIZATION N/A 03/05/2020   Procedure: Coronary/Graft Acute MI Revascularization;  Surgeon: GAdrian Prows MD;  [facility];  Service: Cardiovascular;  Laterality: N/A;   INTRAVASCULAR ULTRASOUND/IVUS N/A 03/06/2020   Procedure: Intravascular Ultrasound/IVUS;  Surgeon: GAdrian Prows MD;  [facility];  Service: Cardiovascular;  Laterality: N/A;   LEFT HEART CATH AND CORONARY ANGIOGRAPHY N/A 03/05/2020   Procedure: LEFT HEART CATH AND CORONARY ANGIOGRAPHY;  Surgeon: GAdrian Prows MD;  [facility];  Service: Cardiovascular;  Laterality: N/A;   MENISCECTOMY Left 2/15   Dr NVeverly Fells  REFRACTIVE SURGERY Bilateral 07/2019   UPPER GI ENDOSCOPY N/A Feb 2017   Family History  Problem Relation Age of Onset   Heart disease Mother    Stroke Father    Hypertension Father    Heart disease Father    Diabetes Father     Social History   Tobacco Use   Smoking status: Never   Smokeless tobacco: Never  Substance Use Topics   Alcohol use: No   Marital Status: Divorced   ROS  Review of Systems  Cardiovascular:  Positive for leg swelling (minimal. stable). Negative for chest pain, claudication, dyspnea on exertion (improving), near-syncope, orthopnea, palpitations, paroxysmal nocturnal dyspnea and syncope.  Respiratory:  Negative for shortness of breath.   Hematologic/Lymphatic: Does  not bruise/bleed easily.  Neurological:  Negative for dizziness.   Objective  Blood pressure 134/71, pulse 73, temperature (!) 96 F (35.6 C), temperature source Temporal, resp. rate 17, height 5' 3.5" (1.613 m), weight 149 lb 3.2 oz (67.7 kg), SpO2 96 %.  Vitals with BMI 03/23/2021 11/30/2020 09/10/2020  Height 5' 3.5" 5' 3.5" 5' 3.5"  Weight 149 lbs 3 oz 147 lbs 3 oz 149 lbs 3 oz  BMI 26.01 11.03 15.94  Systolic 585 929 244  Diastolic 71 78 66  Pulse 73 71 65     Physical  Exam Vitals reviewed.  Constitutional:      Appearance: Normal appearance.  Cardiovascular:     Rate and Rhythm: Normal rate and regular rhythm.     Pulses: Intact distal pulses.          Carotid pulses are 2+ on the right side and 2+ on the left side.      Radial pulses are 2+ on the right side and 2+ on the left side.       Femoral pulses are 2+ on the right side and 2+ on the left side.      Popliteal pulses are 2+ on the right side and 2+ on the left side.       Dorsalis pedis pulses are 0 on the right side and 0 on the left side.       Posterior tibial pulses are 0 on the left side.     Heart sounds: S1 normal and S2 normal. Murmur heard.  Midsystolic murmur is present with a grade of 2/6 at the upper right sternal border.    No gallop.  Pulmonary:     Effort: Pulmonary effort is normal.     Breath sounds: Normal breath sounds.  Musculoskeletal:     Right lower leg: Edema (trace, ankle) present.     Left lower leg: Edema (trace, ankle) present.  Skin:    General: Skin is warm and dry.  Neurological:     Mental Status: She is alert.  Physical exam unchanged compared to previous office visit.  Laboratory examination:   Recent Labs    08/28/20 1104 11/30/20 1101 03/05/21 1512  NA 137 141 139  K 4.1 4.2 4.4  CL 103 106 101  CO2 _0 GLUCOSE 95 107* 121*  BUN _1 CREATININE 0.98 0.96 1.03*  CALCIUM 9.2 9.4 9.8   estimated creatinine clearance is 37.3 mL/min (A) (by C-G formula based on SCr of 1.03 mg/dL (H)).  CMP Latest Ref Rng & Units 03/05/2021 11/30/2020 08/28/2020  Glucose 70 - 99 mg/dL 121(H) 107(H) 95  BUN 8 - 27 mg/dL _2 Creatinine 0.57 - 1.00 mg/dL 1.03(H) 0.96 0.98  Sodium 134 - 144 mmol/L 139 141 137  Potassium 3.5 - 5.2 mmol/L 4.4 4.2 4.1  Chloride 96 - 106 mmol/L 101 106 103  CO2 20 - 29 mmol/L _3 Calcium 8.7 - 10.3 mg/dL 9.8 9.4 9.2  Total Protein 6.0 - 8.3 g/dL - 7.1 6.6  Total Bilirubin 0.2 - 1.2 mg/dL - 0.6 0.6  Alkaline  Phos 39 - 117 U/L - 54 54  AST 0 - 37 U/L - 16 14  ALT 0 - 35 U/L - 10 11   CBC Latest Ref Rng & Units 11/30/2020 08/28/2020 03/07/2020  WBC 4.0 - 10.5 K/uL 7.9 8.6 7.9  Hemoglobin 12.0 - 15.0 g/dL 12.1 12.2 11.7(L)  Hematocrit 36.0 -  46.0 % 36.4 36.3 35.6(L)  Platelets 150.0 - 400.0 K/uL 183.0 150.0 151   Lipid Panel Recent Labs    07/08/20 0921 11/30/20 1101  CHOL 148 131  TRIG 128 112.0  LDLCALC 74 64  VLDL  --  22.4  HDL 51 44.70  CHOLHDL  --  3   HEMOGLOBIN A1C Lab Results  Component Value Date   HGBA1C 6.3 09/25/2019   TSH Recent Labs    08/28/20 1104 11/30/20 1101  TSH 1.23 0.39  BNP    Component Value Date/Time   BNP 144.8 (H) 03/05/2021 1512    ProBNP    Component Value Date/Time   PROBNP 156.0 (H) 08/28/2020 1104    External labs:  None   Allergies  No Known Allergies   Medications Prior to Visit:   Outpatient Medications Prior to Visit  Medication Sig Dispense Refill   aspirin 81 MG EC tablet Take 81 mg by mouth daily.     carvedilol (COREG) 3.125 MG tablet Take 1 tablet (3.125 mg total) by mouth 2 (two) times daily. 180 tablet 3   cetirizine (ZYRTEC) 10 MG tablet Take 10 mg by mouth daily.     Cholecalciferol (VITAMIN D3) 2000 UNITS TABS Take 1 tablet by mouth every evening.     Cyanocobalamin (VITAMIN B 12 PO) Take 1 tablet by mouth every evening.     ezetimibe (ZETIA) 10 MG tablet TAKE 1 TABLET BY MOUTH ONCE A DAY AFTER SUPPER 90 tablet 2   levothyroxine (SYNTHROID) 75 MCG tablet TAKE 1 TABLET BY MOUTH ONCE A DAY ON AN EMPTY STOMACH.TAKE WITH A GLASS OF WATERAT LEAST 30-60 MIN BEFORE BREAKFAST 90 tablet 1   lisinopril (ZESTRIL) 5 MG tablet Take 1 tablet (5 mg total) by mouth daily. 90 tablet 3   nitroGLYCERIN (NITROSTAT) 0.4 MG SL tablet Place 1 tablet (0.4 mg total) under the tongue every 5 (five) minutes as needed for up to 25 days for chest pain. 25 tablet 3   pantoprazole (PROTONIX) 20 MG tablet TAKE 1 TABLET BY MOUTH TWICE A DAY 180  tablet 2   rosuvastatin (CRESTOR) 20 MG tablet Take 1 tablet (20 mg total) by mouth daily. 90 tablet 3   furosemide (LASIX) 40 MG tablet Take 0.5 tablets (20 mg total) by mouth daily as needed for fluid or edema. 30 tablet 3   potassium chloride SA (KLOR-CON) 20 MEQ tablet Take 1 tablet (20 mEq total) by mouth daily as needed. 30 tablet 3   ticagrelor (BRILINTA) 90 MG TABS tablet Take 1 tablet (90 mg total) by mouth 2 (two) times daily. 180 tablet 3   No facility-administered medications prior to visit.   Final Medications at End of Visit    Current Meds  Medication Sig   aspirin 81 MG EC tablet Take 81 mg by mouth daily.   carvedilol (COREG) 3.125 MG tablet Take 1 tablet (3.125 mg total) by mouth 2 (two) times daily.   cetirizine (ZYRTEC) 10 MG tablet Take 10 mg by mouth daily.   Cholecalciferol (VITAMIN D3) 2000 UNITS TABS Take 1 tablet by mouth every evening.   Cyanocobalamin (VITAMIN B 12 PO) Take 1 tablet by mouth every evening.   ezetimibe (ZETIA) 10 MG tablet TAKE 1 TABLET BY MOUTH ONCE A DAY AFTER SUPPER   levothyroxine (SYNTHROID) 75 MCG tablet TAKE 1 TABLET BY MOUTH ONCE A DAY ON AN EMPTY STOMACH.TAKE WITH A GLASS OF WATERAT LEAST 30-60 MIN BEFORE BREAKFAST   lisinopril (ZESTRIL) 5 MG tablet  Take 1 tablet (5 mg total) by mouth daily.   nitroGLYCERIN (NITROSTAT) 0.4 MG SL tablet Place 1 tablet (0.4 mg total) under the tongue every 5 (five) minutes as needed for up to 25 days for chest pain.   pantoprazole (PROTONIX) 20 MG tablet TAKE 1 TABLET BY MOUTH TWICE A DAY   rosuvastatin (CRESTOR) 20 MG tablet Take 1 tablet (20 mg total) by mouth daily.   [DISCONTINUED] furosemide (LASIX) 40 MG tablet Take 0.5 tablets (20 mg total) by mouth daily as needed for fluid or edema.   [DISCONTINUED] potassium chloride SA (KLOR-CON) 20 MEQ tablet Take 1 tablet (20 mEq total) by mouth daily as needed.   [DISCONTINUED] ticagrelor (BRILINTA) 90 MG TABS tablet Take 1 tablet (90 mg total) by mouth 2 (two)  times daily.   Radiology:   No results found.  Cardiac Studies:  PCV ECHOCARDIOGRAM COMPLETE 03/16/2021 Left ventricle cavity is normal in size. Moderate concentric hypertrophy of the left ventricle. Normal global wall motion. Normal LV systolic function with EF 58%. Doppler evidence of grade II (pseudonormal) diastolic dysfunction, elevated LAP. However, this assessment could be affected by moderate MAC. Left atrial cavity is mildly dilated. Trileaflet aortic valve. Moderate aortic valve leaflet calcification. No evidence of aortic stenosis. Mild (Grade I) aortic regurgitation. Moderate aortic valve leaflet calcification. No evidence of mitral stenosis. Mild to moderate mitral regurgitation. Moderate mitral valve leaflet calcification. Mild to moderate tricuspid regurgitation. No evidence of pulmonary hypertension. Compared to previous study on 03/05/2020, LV wall motion abnormality now absent.     Left Heart Catheterization and stenting to RCA 03/05/20:  LV: Normal size.  Inferior akinesis.  LVEF 40%.  No significant MR.  Severe mitral annular calcification evident.  No pressure gradient across the aortic valve. LM: Large vessel, mildly calcified. CX: Very large vessel giving origin to large OM1 small OM 2 very large OM 3.  Ostium of the circumflex has a 30% stenosis.  Very large OM1 with high-grade and irregular marginal 95% stenosis in the proximal segment.  Moderate amount of calcification is evident in the coronary vessels.  Severe tortuosity is evident in the high OM1 and also large OM 3. LAD: Large caliber vessel giving origin to a moderate to large size D1.  Mild luminal irregularities evident. RCA: Large vessel and dominant vessel.  Tortuous in the mid to distal segment.  It is occluded the proximal segment. Successful aspiration thrombectomy with Pronto V4 catheter followed by stenting with 4.0 x 30 mm resolute Onyx DES, stenosis reduced from 100% to 0% and TIMI 0 to TIMI-3 flow.     Coronary angioplasty to OM1 03/06/2020: OCT guided PTCA and stenting of the large OM1 with 2.5 x 15 mm resolute Onyx DES, stenosis reduced from 99% to 0%, >95% stent apposition and expansion.  No edge dissection.   Recommendation: Patient will hopefully be discharged home tomorrow if she remains stable.  She will need dual antiplatelet therapy for a year in view of ACS.  Continue risk factor modification is indicated.  115 mL contrast utilized.   EKG:  03/23/2021: Sinus rhythm at a rate of 60 bpm.  Normal axis.  Borderline first-degree AV block.  Inferior infarct old.  Poor R wave progression, cannot exclude anteroseptal   03/13/2020: Sinus bradycardia rate of 52 bpm, with borderline first-degree AV block..  Inferior infarct old.  Poor R wave progression, cannot exclude anteroseptal infarct old.  T wave abnormality, unchanged from previous.  Low voltage complexes, consider pulmonary disease pattern.  EKG 03/07/2020: Normal sinus rhythm with rate of 73 bpm, normal axis, T wave abnormality, consider inferolateral ischemia.  Compared to 03/06/2020, no significant change.  Assessment     ICD-10-CM   1. Essential hypertension  I10 EKG [id number]    Basic metabolic panel    2. Coronary artery disease involving native coronary artery of native heart without angina pectoris  I25.10     3. Hypercholesterolemia  E78.00        Medications Discontinued During This Encounter  Medication Reason   ticagrelor (BRILINTA) 90 MG TABS tablet Completed Course   potassium chloride SA (KLOR-CON) 20 MEQ tablet Reorder   furosemide (LASIX) 40 MG tablet Reorder    Meds ordered this encounter  Medications   furosemide (LASIX) 20 MG tablet    Sig: Take 1 tablet (20 mg total) by mouth daily as needed for fluid or edema.    Dispense:  90 tablet    Refill:  3   potassium chloride SA (KLOR-CON M) 10 MEQ tablet    Sig: Take 1 tablet (10 mEq total) by mouth daily.    Dispense:  90 tablet    Refill:  3      Recommendations:   Amy Lin is a 88 y.o. Caucasian female with history of hypertension, hyperlipidemia, GERD who presented to [facility] emergency department 03/05/2020 with active chest pain and EKG revealing inferior STEMI.  Underwent successful angioplasty and stenting to proximal RCA, with subsequent staged intervention to OM1.  Recommend patient continue DAPT until at least 03/07/2021 if tolerated.   Patient presents for 91-monthfollow-up.  She is doing fairly well overall with only mild dyspnea on exertion.  There is no clinical evidence of acute heart failure at today's office visit.  However advised patient that she may resume taking Lasix 20 mg p.o. daily.  We will repeat BMP in 1 week.  Reviewed and discussed results of echocardiogram, details above.  Suspect patient's dyspnea on exertion is likely related to underlying diastolic dysfunction.  Patient has completed 1 year of dual antiplatelet therapy, will therefore discontinue Brilinta and continue aspirin 81 mg daily.  We will continue rosuvastatin and Zetia, carvedilol, lisinopril.  Patient's lipids are now well controlled with addition of Zetia.  Blood pressure is well controlled.  Follow-up in 6 months, sooner if needed.   CAlethia Berthold PA-C 03/23/2021, 1:56 PM Office: [phone]

## 2021-03-30 DIAGNOSIS — I10 Essential (primary) hypertension: Secondary | ICD-10-CM | POA: Diagnosis not present

## 2021-03-30 DIAGNOSIS — E039 Hypothyroidism, unspecified: Secondary | ICD-10-CM

## 2021-03-30 DIAGNOSIS — I2510 Atherosclerotic heart disease of native coronary artery without angina pectoris: Secondary | ICD-10-CM

## 2021-03-30 DIAGNOSIS — E782 Mixed hyperlipidemia: Secondary | ICD-10-CM

## 2021-03-30 DIAGNOSIS — M810 Age-related osteoporosis without current pathological fracture: Secondary | ICD-10-CM | POA: Diagnosis not present

## 2021-04-14 ENCOUNTER — Telehealth: Payer: Self-pay | Admitting: Student

## 2021-04-14 NOTE — Telephone Encounter (Signed)
See message above please advise. Do you need me to reach out to patient

## 2021-04-14 NOTE — Telephone Encounter (Signed)
Patient seeing a specialist in regards to hemmorrhoids, and this provider wants to know if it is safe for her to be off of the blood thinners she is currently taking. We can call her back to let her know. Thank you! ?

## 2021-04-15 NOTE — Telephone Encounter (Signed)
If she needs to hold aspirin temporarily that is perfectly fine.  If the question is whether she can be off aspirin indefinitely then I would be more hesitant given her history of CAD.  Can you please call the patient and get some clarification.

## 2021-04-16 NOTE — Telephone Encounter (Signed)
I spoke to patient she stated she has an appt this upcoming Wednesday to see if they can band the hemorrhoid but if that doesn't work they will consider doing something else patient was wondering if she needed to hold her aspirin. Spoke to [employer], [place] there is no need for patient to hold off it if patient was getting anything else done if that didn't work she could hold off 5 days prior

## 2021-04-21 DIAGNOSIS — K648 Other hemorrhoids: Secondary | ICD-10-CM | POA: Diagnosis not present

## 2021-04-23 ENCOUNTER — Other Ambulatory Visit: Payer: Self-pay | Admitting: Student

## 2021-05-05 DIAGNOSIS — K648 Other hemorrhoids: Secondary | ICD-10-CM | POA: Diagnosis not present

## 2021-05-07 ENCOUNTER — Other Ambulatory Visit: Payer: Self-pay | Admitting: Nurse Practitioner

## 2021-05-07 DIAGNOSIS — M810 Age-related osteoporosis without current pathological fracture: Secondary | ICD-10-CM

## 2021-05-12 ENCOUNTER — Other Ambulatory Visit: Payer: PPO

## 2021-05-12 ENCOUNTER — Ambulatory Visit
Admission: RE | Admit: 2021-05-12 | Discharge: 2021-05-12 | Disposition: A | Discharge status: Home / Self Care | Payer: PPO | Source: Ambulatory Visit | Attending: Nurse Practitioner | Admitting: Nurse Practitioner

## 2021-05-12 DIAGNOSIS — Z780 Asymptomatic menopausal state: Secondary | ICD-10-CM | POA: Diagnosis not present

## 2021-05-12 DIAGNOSIS — M810 Age-related osteoporosis without current pathological fracture: Secondary | ICD-10-CM

## 2021-05-12 DIAGNOSIS — I10 Essential (primary) hypertension: Secondary | ICD-10-CM | POA: Diagnosis not present

## 2021-05-13 LAB — BASIC METABOLIC PANEL
BUN/Creatinine Ratio: 19 (ref 12–28)
BUN: 20 mg/dL (ref 8–27)
CO2: 25 mmol/L (ref 20–29)
Calcium: 9.3 mg/dL (ref 8.7–10.3)
Chloride: 104 mmol/L (ref 96–106)
Creatinine, Ser: 1.07 mg/dL — ABNORMAL HIGH (ref 0.57–1.00)
Glucose: 103 mg/dL — ABNORMAL HIGH (ref 70–99)
Potassium: 4.4 mmol/L (ref 3.5–5.2)
Sodium: 143 mmol/L (ref 134–144)
eGFR: 51 mL/min/{1.73_m2} — ABNORMAL LOW (ref >59)

## 2021-05-15 ENCOUNTER — Other Ambulatory Visit: Payer: Self-pay | Admitting: Student

## 2021-05-18 ENCOUNTER — Encounter: Payer: Self-pay | Admitting: Nurse Practitioner

## 2021-05-18 NOTE — Telephone Encounter (Signed)
I know we do prolia. Do we do reclast in office? ?

## 2021-05-19 ENCOUNTER — Telehealth: Payer: Self-pay | Admitting: Nurse Practitioner

## 2021-05-19 NOTE — Telephone Encounter (Signed)
Working on this

## 2021-05-19 NOTE — Telephone Encounter (Signed)
Can we see about placing patient on prolia for her osteoporosis please ?

## 2021-05-24 ENCOUNTER — Ambulatory Visit: Payer: PPO | Admitting: Nurse Practitioner

## 2021-05-26 NOTE — Telephone Encounter (Signed)
Called [employer]-the cost for all patients with this plan is 20% and no PA. Patient advised that the cost OOP will be around $300. ?Patient agreed to proceed.  ?Lab was done on 05/11/21. CrCl is 41.08 mL/min. Calcium normal at 9.3 ?Will get this injection done on 05/28/21 during OV. ?

## 2021-05-28 ENCOUNTER — Ambulatory Visit (INDEPENDENT_AMBULATORY_CARE_PROVIDER_SITE_OTHER): Payer: PPO | Admitting: Nurse Practitioner

## 2021-05-28 VITALS — BP 158/56 | HR 66 | Temp 97.4°F | Resp 12 | Ht 63.5 in | Wt 151.6 lb

## 2021-05-28 DIAGNOSIS — E039 Hypothyroidism, unspecified: Secondary | ICD-10-CM

## 2021-05-28 DIAGNOSIS — R1013 Epigastric pain: Secondary | ICD-10-CM

## 2021-05-28 DIAGNOSIS — I10 Essential (primary) hypertension: Secondary | ICD-10-CM

## 2021-05-28 DIAGNOSIS — M810 Age-related osteoporosis without current pathological fracture: Secondary | ICD-10-CM

## 2021-05-28 MED ORDER — DENOSUMAB 60 MG/ML ~~LOC~~ SOSY
60.0000 mg | PREFILLED_SYRINGE | Freq: Once | SUBCUTANEOUS | Status: AC
  Administered 2021-05-28: 60 mg via SUBCUTANEOUS

## 2021-05-28 NOTE — Progress Notes (Signed)
? ?Established Patient Office Visit ? ?Subjective   ?Patient ID: Amy Lin, female    DOB: 1934-01-29  Age: 88 y.o. MRN: [id number] ? ?Chief Complaint  ?Patient presents with  ? Follow-up  ?  6 months  ? prolia injection  ? ? ?HPI ? ?HTN: Checking BP at home daily to every couple of days. States that she has been running low at home.  ? ?HLD: maintained on rosuvastatin an zeita ? ?TSH: Managed on levothyroxine 75 mcg. ? ?Stomach pain: states for the past month. States that after she eats her stomach would hurt then she would have a BM and then the pain would resovled, This past week she has been ok ? ?Osteoporosis: Patient recently had DEXA scan that did show osteoporosis.  Did have discussion with her and she is to be on Reclast in the past she would like to either do Reclast or Prolia.  Patient was approved for Prolia and first dose administered in office prior to my office visit with patient. ? ?Has appt with podiatrist in may for great toenails and the second toe overlaps the great toe ? ? ? ?Review of Systems  ?Constitutional:  Negative for chills and fever.  ?Respiratory:  Negative for shortness of breath.   ?Cardiovascular:  Negative for chest pain.  ?Gastrointestinal:  Positive for abdominal pain. Negative for diarrhea, nausea and vomiting.  ?Neurological:  Negative for weakness.  ? ?  ?Objective:  ?  ? ?BP (!) 158/56   Pulse 66   Temp (!) 97.4 ?F (36.3 ?C)   Resp 12   Ht 5' 3.5" (1.613 m)   Wt 151 lb 9 oz (68.7 kg)   SpO2 98%   BMI 26.43 kg/m?  ? ? ?Physical Exam ?Vitals and nursing note reviewed.  ?Constitutional:   ?   Appearance: Normal appearance.  ?Cardiovascular:  ?   Rate and Rhythm: Normal rate and regular rhythm.  ?   Heart sounds: Normal heart sounds.  ?Pulmonary:  ?   Effort: Pulmonary effort is normal.  ?   Breath sounds: Normal breath sounds.  ?Abdominal:  ?   General: Bowel sounds are normal. There is no distension.  ?   Palpations: There is no mass.  ?   Tenderness: There is  abdominal tenderness.  ?   Hernia: No hernia is present.  ? ? ?Musculoskeletal:  ?   Right lower leg: Edema present.  ?   Left lower leg: Edema present.  ?Skin: ?   General: Skin is warm.  ?Neurological:  ?   Mental Status: She is alert.  ? ? ? ?No results found for any visits on 05/28/21. ? ? ? ?The ASCVD Risk score (Arnett DK, et al., 2019) failed to calculate for the following reasons: ?  The 2019 ASCVD risk score is only valid for ages 24 to 71 ?  The patient has a prior MI or stroke diagnosis ? ?  ?Assessment & Plan:  ? ?Problem List Items Addressed This Visit   ? ?  ? Cardiovascular and Mediastinum  ? Essential hypertension  ?  Patient's blood pressure elevated in office and upon recheck.  Patient has taken her antihypertensives today.  States she has checked it at home and has low readings at times.  Told her to recheck blood pressure little later this evening when she gets home to make sure she takes her antihypertensives as prescribed and was hypotensive.  No symptoms in office ? ?  ?  ?  ?  Endocrine  ? Hypothyroidism  ?  Currently maintained on levothyroxine 75 mcg.  Continue medication as prescribed defer labs today ? ?  ?  ?  ? Musculoskeletal and Integument  ? Osteoporosis - Primary  ?  Most recent DEXA scan in 2023 showed osteoporosis patient was started on Prolia injections.  First injection today prior to office visit ? ?  ?  ?  ? Other  ? Epigastric pain  ?  Mild tenderness to the epigastrium.  Patient is currently on a PPI therapy.  States over the past week her abdominal pain has resolved did offer to draw labs but patient deferred for now.  If recurrence or she changes her mind follow-up to make an appointment. ? ?  ?  ? ? ?Return in about 6 months (around 11/27/2021) for CPE.  ? ? ?Romilda Garret, NP ? ?

## 2021-05-28 NOTE — Patient Instructions (Signed)
Nice to see you today ?Let me know if you stomach pain comes back and make an appointment  ?I want to see you in 6 months, but sooner if you need me ?

## 2021-05-29 DIAGNOSIS — R1013 Epigastric pain: Secondary | ICD-10-CM | POA: Insufficient documentation

## 2021-05-29 NOTE — Assessment & Plan Note (Signed)
Most recent DEXA scan in 2023 showed osteoporosis patient was started on Prolia injections.  First injection today prior to office visit ?

## 2021-05-29 NOTE — Assessment & Plan Note (Signed)
Mild tenderness to the epigastrium.  Patient is currently on a PPI therapy.  States over the past week her abdominal pain has resolved did offer to draw labs but patient deferred for now.  If recurrence or she changes her mind follow-up to make an appointment. ?

## 2021-05-29 NOTE — Assessment & Plan Note (Signed)
Currently maintained on levothyroxine 75 mcg.  Continue medication as prescribed defer labs today ?

## 2021-05-29 NOTE — Assessment & Plan Note (Signed)
Patient's blood pressure elevated in office and upon recheck.  Patient has taken her antihypertensives today.  States she has checked it at home and has low readings at times.  Told her to recheck blood pressure little later this evening when she gets home to make sure she takes her antihypertensives as prescribed and was hypotensive.  No symptoms in office ?

## 2021-06-15 ENCOUNTER — Other Ambulatory Visit: Payer: Self-pay | Admitting: Cardiology

## 2021-06-15 DIAGNOSIS — I25110 Atherosclerotic heart disease of native coronary artery with unstable angina pectoris: Secondary | ICD-10-CM

## 2021-06-16 ENCOUNTER — Ambulatory Visit: Payer: PPO | Admitting: Podiatry

## 2021-06-16 ENCOUNTER — Ambulatory Visit (INDEPENDENT_AMBULATORY_CARE_PROVIDER_SITE_OTHER): Payer: PPO

## 2021-06-16 ENCOUNTER — Encounter: Payer: Self-pay | Admitting: Podiatry

## 2021-06-16 DIAGNOSIS — M2042 Other hammer toe(s) (acquired), left foot: Secondary | ICD-10-CM

## 2021-06-16 NOTE — Progress Notes (Signed)
She presents today for follow-up of her painful second toe left she states that it hurts when anything touches it because it overlaps the big toe and sits up she states that is been like this for years and she is getting to work.  Hurts. ? ?Objective: Vital signs are stable alert and oriented x3.  Pulses are palpable.  There is no erythema edema cellulitis drainage or odor with the exception of the overlying PIPJ second digit the left foot with mild erythema no open lesions or wounds.  Hallux valgus deformity with overlapping second toe left.  Minimally so on the right. ? ?Radiographs taken today of the left foot demonstrate an osseously mature foot significant hallux abductovalgus deformity with an overlapping second toe osteoarthritic changes of the lesser toes. ? ?Assessment hallux valgus deformity overlapping second toe hammertoe deformity second left. ? ?Plan: Discussed etiology pathology conservative surgical therapies she is declining any surgical discussion at this time.  And she would like to try a digital toe corrector which I provided her as well as silicone padding.  We discussed appropriate shoe gear and stretching. ?

## 2021-07-07 ENCOUNTER — Telehealth: Payer: Self-pay

## 2021-07-07 NOTE — Chronic Care Management (AMB) (Signed)
Chronic Care Management Pharmacy Assistant   Name: Amy Lin  MRN: [id number] DOB: 1933-10-18   Reason for Encounter: General Adherence  Recent office visits:  05/28/21-[name],NP(fam med)- prolia injection given,f/u 6 months  Recent consult visits:  06/16/21-Max Hyatt,DPM(podiatry)-Left toe pain,xrays,digital toe corrector and silicone padding given. 05/05/21-Meffrey Medoff,MD(Gastro)-Hemorrhoid treatment in GI clinic,banding,avoid nsaids, f/u as needed 03/23/21-[name],PA(cardio)-f/u CAD, advised patient that she may resume taking Lasix 20 mg p.o. daily-f/u 6 months  Hospital visits:  None in previous 6 months  Medications: Outpatient Encounter Medications as of 07/07/2021  Medication Sig   aspirin 81 MG EC tablet Take 81 mg by mouth daily.   carvedilol (COREG) 3.125 MG tablet Take 1 tablet (3.125 mg total) by mouth 2 (two) times daily.   Cholecalciferol (VITAMIN D3) 2000 UNITS TABS Take 1 tablet by mouth every evening.   Cyanocobalamin (VITAMIN B 12 PO) Take 1 tablet by mouth every evening.   ezetimibe (ZETIA) 10 MG tablet TAKE 1 TABLET BY MOUTH ONCE A DAY AFTER SUPPER   furosemide (LASIX) 20 MG tablet Take 1 tablet (20 mg total) by mouth daily as needed for fluid or edema.   levothyroxine (SYNTHROID) 75 MCG tablet TAKE 1 TABLET BY MOUTH ONCE A DAY ON AN EMPTY STOMACH.TAKE WITH A GLASS OF WATERAT LEAST 30-60 MIN BEFORE BREAKFAST   lisinopril (ZESTRIL) 5 MG tablet TAKE 1 TABLET BY MOUTH ONCE A DAY   loratadine (CLARITIN) 10 MG tablet Take 10 mg by mouth daily.   nitroGLYCERIN (NITROSTAT) 0.4 MG SL tablet DISSOLVE 1 TABLET UNDER TONGUE AS NEEDEDFOR CHEST PAIN. MAY REPEAT 5 MINUTES APART 3 TIMES IF NEEDED   pantoprazole (PROTONIX) 20 MG tablet TAKE 1 TABLET BY MOUTH TWICE A DAY   potassium chloride SA (KLOR-CON M) 10 MEQ tablet Take 1 tablet (10 mEq total) by mouth daily.   rosuvastatin (CRESTOR) 20 MG tablet TAKE 1 TABLET BY MOUTH ONCE A DAY   No  facility-administered encounter medications on file as of 07/07/2021.   Contacted [name] on 07/12/21 for general disease state and medication adherence call.   Patient is not more than 5 days past due for refill on the following medications per chart history:  [facility] Medications: Medication Name/mg Last Fill Days Supply Lisinopril '5mg'$    04/23/21 90 Rosuvastatin '20mg'$   05/17/88 90    What concerns do you have about your medications? No concerns at this time   The patient denies side effects with their medications.   How often do you forget or accidentally miss a dose? Never  Do you use a pillbox? Yes  Are you having any problems getting your medications from your pharmacy? No  [facility] provides good service   Has the cost of your medications been a concern? No  Since last visit with CPP, the following interventions have been made.   Advised she can start furosemide 1/2 tab daily; advised she discuss this at upcoming cardiology appt- uses '20mg'$  now.  Consider Prolia depending on upcoming DEXA results-  The patient has not had an ED visit since last contact.   The patient denies problems with their health.   Patient denies concerns or questions for [name], PharmD at this time.   Counseled patient on:  [place] job taking medications, Importance of taking medication daily without missed doses, Benefits of adherence packaging or a pillbox, and Access to CCM team for any cost, medication or pharmacy concerns.   Care Gaps: Annual wellness visit in last year?  Yes Most Recent BP reading:158/56  66-P  Upcoming appointments: PCP appointment on 11/29/21 and CCM appointment on 09/09/21   [name], CPP notified  [name], [place]  [phone]

## 2021-07-20 ENCOUNTER — Other Ambulatory Visit: Payer: Self-pay | Admitting: Nurse Practitioner

## 2021-07-30 ENCOUNTER — Ambulatory Visit (INDEPENDENT_AMBULATORY_CARE_PROVIDER_SITE_OTHER): Payer: PPO | Admitting: Nurse Practitioner

## 2021-07-30 ENCOUNTER — Encounter: Payer: Self-pay | Admitting: Nurse Practitioner

## 2021-07-30 VITALS — BP 130/62 | HR 58 | Temp 96.9°F | Resp 12 | Ht 63.5 in | Wt 149.6 lb

## 2021-07-30 DIAGNOSIS — J0110 Acute frontal sinusitis, unspecified: Secondary | ICD-10-CM | POA: Diagnosis not present

## 2021-07-30 DIAGNOSIS — R0982 Postnasal drip: Secondary | ICD-10-CM

## 2021-07-30 MED ORDER — HYDROCORTISONE 2.5 % EX LOTN: TOPICAL_LOTION | Freq: Two times a day (BID) | CUTANEOUS | 0 refills | Status: DC

## 2021-07-30 MED ORDER — FLUTICASONE PROPIONATE 50 MCG/ACT NA SUSP: 1.0000 | Freq: Every day | NASAL | 0 refills | Status: DC

## 2021-07-30 MED ORDER — AMOXICILLIN-POT CLAVULANATE 875-125 MG PO TABS: 1.0000 | ORAL_TABLET | Freq: Two times a day (BID) | ORAL | 0 refills | Status: AC

## 2021-07-30 NOTE — Assessment & Plan Note (Signed)
Will send him Flonase 1 spray each nostril.  Precautions given in regards to inciting epistaxis.  Patient continue using antihistamine as directed

## 2021-07-30 NOTE — Progress Notes (Signed)
Acute Office Visit  Subjective:     Patient ID: Amy Lin, female    DOB: [birth year], 88 y.o.   MRN: [id number]  Chief Complaint  Patient presents with   Nasal Congestion    Sx started about 1 week. Head congestion, right ear feels like it has some fluid in it, post nasal drip, scratchy throat.     HPI Patient is in today for URI symptoms  Symptoms started approx 1 week ago No sick contacts [place] x2 and at least one booster States OTC Claritin and an otc eye drop for goop in her eyes.    States that she was out in the yard and recnelty changed to claritian from another antihitamine  Review of Systems  Constitutional:  Positive for fever (subjective low grade) and malaise/fatigue. Negative for chills.  HENT:  Positive for congestion, ear pain and sinus pain. Negative for ear discharge and sore throat (scratchy).        PND "+"  Respiratory:  Positive for cough. Negative for shortness of breath.   Musculoskeletal:  Negative for joint pain and myalgias.  Neurological:  Positive for dizziness and headaches.        Objective:    BP 130/62   Pulse (!) 58   Temp (!) 96.9 F (36.1 C) (Temporal)   Resp 12   Ht 5' 3.5" (1.613 m)   Wt 149 lb 9 oz (67.8 kg)   SpO2 93%   BMI 26.08 kg/m    Physical Exam Vitals and nursing note reviewed.  Constitutional:      Appearance: Normal appearance.  HENT:     Right Ear: Tympanic membrane, ear canal and external ear normal.     Left Ear: Tympanic membrane, ear canal and external ear normal.     Nose:     Right Sinus: Maxillary sinus tenderness and frontal sinus tenderness present.     Left Sinus: No maxillary sinus tenderness or frontal sinus tenderness.     Mouth/Throat:     Mouth: Mucous membranes are moist.  Cardiovascular:     Rate and Rhythm: Normal rate and regular rhythm.     Heart sounds: Normal heart sounds.  Pulmonary:     Effort: Pulmonary effort is normal.     Breath sounds: Normal breath sounds.   Lymphadenopathy:     Cervical: Cervical adenopathy present.  Neurological:     Mental Status: She is alert.     No results found for any visits on 07/30/21.      Assessment & Plan:   Problem List Items Addressed This Visit       Respiratory   Acute non-recurrent frontal sinusitis - Primary    We will treat with Flonase 1 spray each nostril.  Along with Augmentin 875-25 mg twice daily for 7 days.  Follow-up if no improvement      Relevant Medications   amoxicillin-clavulanate (AUGMENTIN) 875-125 MG tablet   fluticasone (FLONASE) 50 MCG/ACT nasal spray     Other   PND (post-nasal drip)    Will send him Flonase 1 spray each nostril.  Precautions given in regards to inciting epistaxis.  Patient continue using antihistamine as directed      Relevant Medications   fluticasone (FLONASE) 50 MCG/ACT nasal spray    Meds ordered this encounter  Medications   amoxicillin-clavulanate (AUGMENTIN) 875-125 MG tablet    Sig: Take 1 tablet by mouth 2 (two) times daily for 7 days.    Dispense:  14 tablet  Refill:  0    Order Specific Question:   Supervising Provider    Answer:   Loura Pardon A [[id number]]   fluticasone (FLONASE) 50 MCG/ACT nasal spray    Sig: Place 1 spray into both nostrils daily.    Dispense:  16 g    Refill:  0    Order Specific Question:   Supervising Provider    Answer:   Loura Pardon A [[id number]]    Return if symptoms worsen or fail to improve, for As scheduled for your CPE.  Romilda Garret, NP

## 2021-07-30 NOTE — Assessment & Plan Note (Signed)
We will treat with Flonase 1 spray each nostril.  Along with Augmentin 875-25 mg twice daily for 7 days.  Follow-up if no improvement

## 2021-07-30 NOTE — Patient Instructions (Signed)
Nice to see you today I sent in an antibiotic and nose spray to you pharmacy If you do not improve or symptoms get worse, follow up Let me know what cream you are talking about and I will send in a new prescription

## 2021-07-30 NOTE — Telephone Encounter (Signed)
I did not see a note about another anti histamine

## 2021-08-10 DIAGNOSIS — Z961 Presence of intraocular lens: Secondary | ICD-10-CM | POA: Diagnosis not present

## 2021-08-10 DIAGNOSIS — H524 Presbyopia: Secondary | ICD-10-CM | POA: Diagnosis not present

## 2021-08-11 ENCOUNTER — Other Ambulatory Visit: Payer: Self-pay | Admitting: Nurse Practitioner

## 2021-08-11 DIAGNOSIS — E039 Hypothyroidism, unspecified: Secondary | ICD-10-CM

## 2021-08-16 ENCOUNTER — Other Ambulatory Visit: Payer: Self-pay | Admitting: Student

## 2021-08-31 ENCOUNTER — Encounter: Payer: Self-pay | Admitting: Nurse Practitioner

## 2021-08-31 ENCOUNTER — Ambulatory Visit (INDEPENDENT_AMBULATORY_CARE_PROVIDER_SITE_OTHER): Payer: PPO | Admitting: Nurse Practitioner

## 2021-08-31 VITALS — BP 120/62 | HR 48 | Temp 97.5°F | Resp 14 | Wt 151.5 lb

## 2021-08-31 DIAGNOSIS — J0111 Acute recurrent frontal sinusitis: Secondary | ICD-10-CM | POA: Diagnosis not present

## 2021-08-31 MED ORDER — DOXYCYCLINE HYCLATE 100 MG PO TABS: 100.0000 mg | ORAL_TABLET | Freq: Two times a day (BID) | ORAL | 0 refills | Status: DC

## 2021-08-31 MED ORDER — PREDNISONE 20 MG PO TABS: ORAL_TABLET | ORAL | 0 refills | Status: AC

## 2021-08-31 MED ORDER — DOXYCYCLINE HYCLATE 100 MG PO TABS: 100.0000 mg | ORAL_TABLET | Freq: Two times a day (BID) | ORAL | 0 refills | Status: AC

## 2021-08-31 NOTE — Patient Instructions (Addendum)
Nice to see you today Let me know if you do not get better with this treatment  Keep your appt as schedule with me in October

## 2021-08-31 NOTE — Progress Notes (Signed)
Established Patient Office Visit  Subjective   Patient ID: Amy Lin, female    DOB: [birth year]  Age: 88 y.o. MRN: [id number]  Chief Complaint  Patient presents with   Sinus Problem    Was treated for sinus infection on 07/30/21 and symptoms did not resolve 100% and then got worse again in the last week. Had sinus pain yesterday, head feels full/congested, ears congested and some pain. Had temp of 99.7 yesterday. She has been taking Xyzal.      Sinus issue: Patient was seen and evaulated on 07/30/2021 and dx with sinus infection. She was prescribed augmentin and flonase. She presents today for additional sinus issues  Improved some since last visit but did not clear all the way. States it started to get worse.  States symptoms have come back for approx 1 week.  States she did take the Augmentin as prescribed to completion.  Has been vaccinated against COVID.  Has taken tylenol with rhinitis that helped some   Review of Systems  Constitutional:  Positive for chills, fever and malaise/fatigue.  HENT:  Positive for ear pain (full) and sinus pain. Negative for ear discharge and sore throat.        "+" PND  Respiratory:  Positive for cough. Negative for shortness of breath.   Cardiovascular:  Negative for chest pain.  Gastrointestinal:  Negative for abdominal pain, diarrhea, nausea and vomiting.  Neurological:  Positive for headaches.      Objective:     BP 120/62   Pulse (!) 48   Temp (!) 97.5 F (36.4 C)   Resp 14   Wt 151 lb 8 oz (68.7 kg)   SpO2 98%   BMI 26.42 kg/m    Physical Exam Vitals and nursing note reviewed.  Constitutional:      Appearance: Normal appearance.  HENT:     Right Ear: Tympanic membrane, ear canal and external ear normal.     Left Ear: Tympanic membrane, ear canal and external ear normal.     Nose:     Right Sinus: Frontal sinus tenderness present. No maxillary sinus tenderness.     Left Sinus: Frontal sinus tenderness present. No  maxillary sinus tenderness.     Mouth/Throat:     Mouth: Mucous membranes are moist.     Pharynx: Oropharynx is clear.  Cardiovascular:     Rate and Rhythm: Normal rate and regular rhythm.     Heart sounds: Normal heart sounds.  Pulmonary:     Effort: Pulmonary effort is normal.     Breath sounds: Normal breath sounds.  Lymphadenopathy:     Cervical: No cervical adenopathy.  Neurological:     Mental Status: She is alert.      No results found for any visits on 08/31/21.    The ASCVD Risk score (Arnett DK, et al., 2019) failed to calculate for the following reasons:   The 2019 ASCVD risk score is only valid for ages 61 to 38   The patient has a prior MI or stroke diagnosis    Assessment & Plan:   Problem List Items Addressed This Visit       Respiratory   Acute recurrent frontal sinusitis - Primary    We will treat with Augmentin and Flonase last time.  Symptoms not fully resolved we will switch patient to doxycycline 100 mg twice daily for 10 days.  Also add on some prednisone.  Patient was informed to avoid NSAIDs while on the prednisone.  Follow-up if no improvement      Relevant Medications   levocetirizine (XYZAL) 5 MG tablet   predniSONE (DELTASONE) 20 MG tablet   doxycycline (VIBRA-TABS) 100 MG tablet    Return if symptoms worsen or fail to improve, for as scheduled.    Romilda Garret, NP

## 2021-08-31 NOTE — Assessment & Plan Note (Signed)
We will treat with Augmentin and Flonase last time.  Symptoms not fully resolved we will switch patient to doxycycline 100 mg twice daily for 10 days.  Also add on some prednisone.  Patient was informed to avoid NSAIDs while on the prednisone.  Follow-up if no improvement

## 2021-09-06 ENCOUNTER — Telehealth: Payer: Self-pay

## 2021-09-06 NOTE — Chronic Care Management (AMB) (Signed)
    Chronic Care Management Pharmacy Assistant   Name: Amy Lin  MRN: [id number] DOB: [birth year]  Reason for Encounter: Reminder Call    Conditions to be addressed/monitored: CAD, HTN, and HLD   Medications: Outpatient Encounter Medications as of 09/06/2021  Medication Sig   aspirin 81 MG EC tablet Take 81 mg by mouth daily.   carvedilol (COREG) 3.125 MG tablet TAKE 1 TABLET BY MOUTH TWICE A DAY   Cholecalciferol (VITAMIN D3) 2000 UNITS TABS Take 1 tablet by mouth every evening.   Cyanocobalamin (VITAMIN B 12 PO) Take 1 tablet by mouth every evening.   doxycycline (VIBRA-TABS) 100 MG tablet Take 1 tablet (100 mg total) by mouth 2 (two) times daily for 10 days.   ezetimibe (ZETIA) 10 MG tablet TAKE 1 TABLET BY MOUTH ONCE A DAY AFTER SUPPER   fluticasone (FLONASE) 50 MCG/ACT nasal spray Place 1 spray into both nostrils daily.   furosemide (LASIX) 20 MG tablet Take 1 tablet (20 mg total) by mouth daily as needed for fluid or edema.   hydrocortisone 2.5 % lotion Apply topically 2 (two) times daily.   levocetirizine (XYZAL) 5 MG tablet Take 5 mg by mouth every evening.   levothyroxine (SYNTHROID) 75 MCG tablet TAKE 1 TABLET BY MOUTH ONCE A DAY ON AN EMPTY STOMACH.TAKE WITH A GLASS OF WATERAT LEAST 30-60 MIN BEFORE BREAKFAST   lisinopril (ZESTRIL) 5 MG tablet TAKE 1 TABLET BY MOUTH ONCE A DAY   nitroGLYCERIN (NITROSTAT) 0.4 MG SL tablet DISSOLVE 1 TABLET UNDER TONGUE AS NEEDEDFOR CHEST PAIN. MAY REPEAT 5 MINUTES APART 3 TIMES IF NEEDED   pantoprazole (PROTONIX) 20 MG tablet TAKE 1 TABLET BY MOUTH TWICE A DAY   potassium chloride SA (KLOR-CON M) 10 MEQ tablet Take 1 tablet (10 mEq total) by mouth daily.   predniSONE (DELTASONE) 20 MG tablet Take 1 tablet (20 mg total) by mouth 2 (two) times daily with a meal for 3 days, THEN 1 tablet (20 mg total) daily with breakfast for 3 days. Avoid NSAIDs like ibuprofen, Aleve, Naproxen, motrin, BC/Goody Powders.   rosuvastatin (CRESTOR) 20 MG tablet  TAKE 1 TABLET BY MOUTH ONCE A DAY   No facility-administered encounter medications on file as of 09/06/2021.   [name] was contacted to remind of upcoming telephone visit with [name] on 09/09/21 at 11:00am. Patient was reminded to have any blood glucose and blood pressure readings available for review at appointment.   Message was left reminding patient of appointment. No answer or VM    CCM referral has been placed prior to visit?  No   [facility] Rating Drugs: Medication:  Last Fill: Day Supply Lisinopril '5mg'$   08/16/21 [place], CPP notified  [name], [place]  [phone]

## 2021-09-09 ENCOUNTER — Ambulatory Visit: Payer: PPO | Admitting: Pharmacist

## 2021-09-09 NOTE — Patient Instructions (Signed)
Visit Information  Phone number for Pharmacist: [phone]   Goals Addressed   None     Care Plan : [place]  Updates made by Charlton Haws, RPH since 09/09/2021 12:00 AM     Problem: Hypertension, Hyperlipidemia, Coronary Artery Disease, GERD, Hypothyroidism, and Osteoporosis   Priority: High     Long-Range Goal: Disease mgmt   Start Date: 03/18/2021  Expected End Date: 03/19/2022  This Visit's Progress: On track  Recent Progress: On track  Priority: High  Note:   Current Barriers:  Unable to maintain control of BP (low BP)  Pharmacist Clinical Goal(s):  Patient will adhere to plan to optimize therapeutic regimen for BP as evidenced by report of adherence to recommended medication management changes through collaboration with PharmD and provider.   Interventions: 1:1 collaboration with Michela Pitcher, NP regarding development and update of comprehensive plan of care as evidenced by provider attestation and co-signature Inter-disciplinary care team collaboration (see longitudinal plan of care) Comprehensive medication review performed; medication list updated in electronic medical record  Hypertension (BP goal <140/90) -Controlled - pt reports frequent urination without taking Lasix, she wonders if prednisone can cause this - discussed prednisone can contribute to this; advised pt to finish course of prednisone, monitor urination and let PCP know if frequency persists -Pt does not take lisinopril or carvedilol if BP is low (SBP ~100) -Current home readings:  8/8 100/53, 56 - skipped BP meds 8/3 117/62 8/1 135/59  (120/62 in OV) 7/29 111/52 7/28 98/50 -Current treatment: Carvedilol 3.125 mg BID - Appropriate, Effective, Safe, Accessible Lisinopril 5 mg daily -Appropriate, Effective, Safe, Accessible Furosemide 20 mg daily PRN - Appropriate, Effective, Safe, Accessible Klor Con 20 mEq daily PRN (w/ Lasix) -Appropriate, Effective, Safe,  Accessible -Medications previously tried: triamterene/HCTZ  -Current dietary habits: tries to limit salt -Current exercise habits: walks on treadmill ~1 mile -Educated on BP goals and benefits of medications for prevention of heart attack, stroke and kidney damage; Daily salt intake goal < 2300 mg; Importance of home blood pressure monitoring; -Counseled to monitor BP at home daily; consider reducing medication if persistently low  Hyperlipidemia / CAD (LDL goal < 70) -Controlled - LDL improved 114 > 64 after STEMI and adding ezetimibe; she has used NTG once but thinks it was actually heartburn -Hx CAD - STEMI 03/2020 s/p stent (Rec'd DAPT through at least 03/07/21) -Current treatment: Rosuvastatin 20 mg daily AM -Appropriate, Effective, Safe, Accessible Ezetimibe 10 mg daily - Appropriate, Effective, Safe, Accessible Nitroglycerin 0.4 mg SL prn -Appropriate, Effective, Safe, Accessible Aspirin 81 mg daily -Appropriate, Effective, Safe, Accessible -Medications previously tried: Brilinta x 1 year -Educated on Cholesterol goals; Benefits of statin for ASCVD risk reduction; -Discussed she will likely come off of Brilinta this month after cardiology f/u; advised she keep taking Brilinta until cardiologist tells her to stop -Recommended to continue current medication  Osteoporosis (Goal prevent fractures) -Controlled - pt started Prolia this year, she is tolerating it well -Last DEXA Scan: 10/01/2018 >> 05/12/21  T-Score femoral neck: -2.5 >> -2.7  T-Score total hip: -1.6 >> -1.7  T-Score forearm radius: -2.1 >> -2.7  10-year probability of major osteoporotic fracture: 37.3%  10-year probability of hip fracture: 27% -Patient is a candidate for pharmacologic treatment due to T-Score -1.0 to -2.5 and 10-year risk of major osteoporotic fracture > 20% and T-Score -1.0 to -2.5 and 10-year risk of hip fracture > 3% -Current treatment  Vitamin D 2000 IU - Appropriate, Effective, Safe,  Accessible Prolia  injection (first dose 05/28/21) - Appropriate, Query Effective -Medications previously tried: zoledronic acid (x 2 doses 2014, 2015), alendronate > 5 years  -Recommend [phone] units of vitamin D daily. Recommend 1200 mg of calcium daily from dietary and supplemental sources. Recommend weight-bearing and muscle strengthening exercises for building and maintaining bone density. -Recommend to continue current medication; next Prolia dose due after 11/27/21  Hypothyroidism (Goal: maintain TSH in goal range) -Controlled - TSH is low-normal; she takes levothyroxine first thing in AM separate from other meds -Current treatment  Levothyroxine 75 mcg daily - Appropriate, Effective, Safe, Accessible -Recommended to continue current medication  GERD (Goal: manage symptoms) -Controlled - pt reports taking BID for over a year; she is concerned if she reduces it she will not be able to tell difference between GERD and angina -Current treatment  Pantoprazole 20 mg BID - Appropriate, Effective, Query Safe -PPI can worsen bone density, however given past episodes of chest pain that were determine to be GERD-related, it is more prudent to continue PPI and monitor bone density (repeat DEXA upcoming) -Recommended to continue current medication  Health Maintenance -Vaccine gaps: Shingrix, TDAP  Patient Goals/Self-Care Activities Patient will:  - take medications as prescribed as evidenced by patient report and record review focus on medication adherence by pill box check blood pressure daily, document, and provide at future appointments      Patient verbalizes understanding of instructions and care plan provided today and agrees to view in [place]. Active MyChart status and patient understanding of how to access instructions and care plan via MyChart confirmed with patient.    Telephone follow up appointment with pharmacy team member scheduled for: 6 months  [name], PharmD, [facility] Clinical  Pharmacist [facility] at [facility] [phone]

## 2021-09-09 NOTE — Progress Notes (Signed)
Chronic Care Management Pharmacy Note  09/09/2021 Name:  Amy Lin MRN:  [id number] DOB:  15-Jul-1933  Summary: CCM F/U visit -Pt reports frequent urination recently (~1 week) and she has not been using Lasix; she has been on prednisone and this could be contributing -Pt reports soft BP fairly often, she skips doses of BP meds when SBP is ~100  Recommendations/Changes made from today's visit: -Advised to finish course of prednisone; monitor urination and contact PCP if it does not improve -Advised pt to keep track of how often she skips BP meds; if it is >once a week, consider reducing lisinopril or carvedilol -Next Prolia injection due after 11/27/21  Follow up: -Lily will call patient 1 months for BP update -Pharmacist follow up televisit scheduled for 6 months -PCP f/u 11/29/21   Subjective: Amy Lin is an 88 y.o. year old female who is a primary patient of Michela Pitcher, NP.  The CCM team was consulted for assistance with disease management and care coordination needs.    Engaged with patient by telephone for follow up visit in response to provider referral for pharmacy case management and/or care coordination services.   Consent to Services:  The patient was given information about Chronic Care Management services, agreed to services, and gave verbal consent prior to initiation of services.  Please see initial visit note for detailed documentation.   Patient Care Team: Michela Pitcher, NP as PCP - General (Pain Medicine) Charlton Haws, [facility] as Pharmacist (Pharmacist)   Recent office visits: 08/31/21 NP Romilda Garret OV: sinusitis - Rx'd doxycycline, prednisone. 07/30/21 NP Matt Charmian Muff OV: acute visit - sinusitis. Rx'd Augmentin, Flonase.  05/28/21 NP Matt Charmian Muff OV: f/u - Prolia administered. BP 158/56, advised monitoring at home. 05/2021 PCP message - starting Prolia  11/30/20-PCP-[name],NP-Patient presented for AWV. Labs ordered,(looks good)  discussed vaccines,discussed screenings, schedule DEXA scan, possible neurology referral,follow up 6 months   08/28/20-PCP-[name],NP-Patient presented for transfer of care.complaint of SOB and sinus pressure.Labs ordered,(BNP elevated) Recommend lasix on as needed basis ,chest xray,EKG-continue antihistamine.Start azithromycin 250mg ,follow up 3 months.  Recent consult visits: 03/23/21 PA Lawerance Cruel (Cardiology): f/u - may resume furosemide 20 mg daily; repeat BMP 1 week. D/C Brilinta (completed 1 year).   02/03/21-[facility] presented for nasal congestion and cough.Home covid-19 test negative, start amoxicillin 875mg   1 tablet twice daily for 7 days, use mucinex for symptomatic treatment.  09/10/20-Cardiology-[name],PA-Patient presented for follow up heart disease.EKG,Recommend patient continue DAPT until at least 03/07/2021 if tolerated.  Hospital visits: None in previous 6 months   Objective:  Lab Results  Component Value Date   CREATININE 1.07 (H) 05/12/2021   BUN 20 05/12/2021   GFR 54.21 (L) 11/30/2020   EGFR 51 (L) 05/12/2021   GFRNONAA 47 (L) 03/07/2020   GFRAA 91 03/12/2008   NA 143 05/12/2021   K 4.4 05/12/2021   CALCIUM 9.3 05/12/2021   CO2 25 05/12/2021   GLUCOSE 103 (H) 05/12/2021    Lab Results  Component Value Date/Time   HGBA1C 6.3 09/25/2019 02:58 PM   HGBA1C 6.2 11/11/2015 02:13 PM   GFR 54.21 (L) 11/30/2020 11:01 AM   GFR 52.98 (L) 08/28/2020 11:04 AM    Last diabetic Eye exam: No results found for: "HMDIABEYEEXA"  Last diabetic Foot exam: No results found for: "HMDIABFOOTEX"   Lab Results  Component Value Date   CHOL 131 11/30/2020   HDL 44.70 11/30/2020   [place]  64 11/30/2020   LDLDIRECT 156.0 11/06/2014   TRIG 112.0 11/30/2020   CHOLHDL 3 11/30/2020       Latest Ref Rng & Units 11/30/2020   11:01 AM 08/28/2020   11:04 AM 03/05/2020   10:13 AM  Hepatic Function  Total Protein 6.0 - 8.3  g/dL 7.1  6.6  6.8   Albumin 3.5 - 5.2 g/dL 3.9  3.6  3.4   AST 0 - 37 U/L $Remo'16  14  22   'vAfVP$ ALT 0 - 35 U/L $Remo'10  11  16   'jAeBT$ Alk Phosphatase 39 - 117 U/L 54  54  51   Total Bilirubin 0.2 - 1.2 mg/dL 0.6  0.6  0.7     Lab Results  Component Value Date/Time   TSH 0.39 11/30/2020 11:01 AM   TSH 1.23 08/28/2020 11:04 AM   FREET4 1.03 11/30/2020 11:01 AM   FREET4 1.02 09/25/2019 02:58 PM       Latest Ref Rng & Units 11/30/2020   11:01 AM 08/28/2020   11:04 AM 03/07/2020    2:15 AM  CBC  WBC 4.0 - 10.5 K/uL 7.9  8.6  7.9   Hemoglobin 12.0 - 15.0 g/dL 12.1  12.2  11.7   Hematocrit 36.0 - 46.0 % 36.4  36.3  35.6   Platelets 150.0 - 400.0 K/uL 183.0  150.0  151     Lab Results  Component Value Date/Time   VD25OH 47.50 09/25/2019 02:58 PM   VD25OH 45.40 09/13/2018 12:12 PM    Clinical ASCVD: Yes  The ASCVD Risk score (Arnett DK, et al., 2019) failed to calculate for the following reasons:   The 2019 ASCVD risk score is only valid for ages 67 to 13   The patient has a prior MI or stroke diagnosis       11/30/2020   10:04 AM 06/02/2020   11:47 AM 04/28/2020   11:23 AM  Depression screen PHQ 2/9  Decreased Interest 0 0 1  Down, Depressed, Hopeless 0 0 0  PHQ - 2 Score 0 0 1  Altered sleeping  0 0  Tired, decreased energy  1 2  Change in appetite  1 0  Feeling bad or failure about yourself   0 0  Trouble concentrating  0 2  Moving slowly or fidgety/restless  0 0  Suicidal thoughts  0 0  PHQ-9 Score  2 5  Difficult doing work/chores  Somewhat difficult Somewhat difficult     Social History   Tobacco Use  Smoking Status Never  Smokeless Tobacco Never   BP Readings from Last 3 Encounters:  08/31/21 120/62  07/30/21 130/62  05/28/21 (!) 158/56   Pulse Readings from Last 3 Encounters:  08/31/21 (!) 48  07/30/21 (!) 58  05/28/21 66   Wt Readings from Last 3 Encounters:  08/31/21 151 lb 8 oz (68.7 kg)  07/30/21 149 lb 9 oz (67.8 kg)  05/28/21 151 lb 9 oz (68.7 kg)   BMI  Readings from Last 3 Encounters:  08/31/21 26.42 kg/m  07/30/21 26.08 kg/m  05/28/21 26.43 kg/m    Assessment/Interventions: Review of patient past medical history, allergies, medications, health status, including review of consultants reports, laboratory and other test data, was performed as part of comprehensive evaluation and provision of chronic care management services.   SDOH:  (Social Determinants of Health) assessments and interventions performed: No - done Feb 2023   SDOH Screenings   Alcohol Screen: Not on file  Depression (LPF7-9): Low Risk  (  11/30/2020)   Depression (PHQ2-9)    PHQ-2 Score: 0  Financial Resource Strain: Low Risk  (03/18/2021)   Overall Financial Resource Strain (CARDIA)    Difficulty of Paying Living Expenses: Not hard at all  Food Insecurity: No Food Insecurity (03/18/2021)   Hunger Vital Sign    Worried About Running Out of Food in the Last Year: Never true    Ran Out of Food in the Last Year: Never true  Housing: Not on file  Physical Activity: Not on file  Social Connections: Not on file  Stress: Not on file  Tobacco Use: Low Risk  (08/31/2021)   Patient History    Smoking Tobacco Use: Never    Smokeless Tobacco Use: Never    Passive Exposure: Not on file  Transportation Needs: Not on file    [place]  No Known Allergies  Medications Reviewed Today     Reviewed by Kris Mouton, CMA (Certified Medical Assistant) on 08/31/21 at 1105  Med List Status: <None>   Medication Order Taking? Sig Documenting Provider Last Dose Status Informant  aspirin 81 MG EC tablet [id number] Yes Take 81 mg by mouth daily. [provider] Taking Active Self  carvedilol (COREG) 3.125 MG tablet [id number] Yes TAKE 1 TABLET BY MOUTH TWICE A DAY Cantwell, Celeste C, PA-C Taking Active   Cholecalciferol (VITAMIN D3) 2000 UNITS TABS [id number] Yes Take 1 tablet by mouth every evening. [provider] Taking Active Self  Cyanocobalamin (VITAMIN  B 12 PO) [id number] Yes Take 1 tablet by mouth every evening. [provider] Taking Active Self  ezetimibe (ZETIA) 10 MG tablet [id number] Yes TAKE 1 TABLET BY MOUTH ONCE A DAY AFTER SUPPER Cantwell, Celeste C, PA-C Taking Active   fluticasone (FLONASE) 50 MCG/ACT nasal spray [id number] Yes Place 1 spray into both nostrils daily. Michela Pitcher, NP Taking Active   furosemide (LASIX) 20 MG tablet [id number] Yes Take 1 tablet (20 mg total) by mouth daily as needed for fluid or edema. Cantwell, Celeste C, PA-C Taking Active   hydrocortisone 2.5 % lotion [id number] Yes Apply topically 2 (two) times daily. Michela Pitcher, NP Taking Active   levocetirizine (XYZAL) 5 MG tablet [id number] Yes Take 5 mg by mouth every evening. [provider] Taking Active   levothyroxine (SYNTHROID) 75 MCG tablet [id number] Yes TAKE 1 TABLET BY MOUTH ONCE A DAY ON AN EMPTY STOMACH.TAKE WITH A GLASS OF WATERAT LEAST 30-60 MIN BEFORE BREAKFAST Michela Pitcher, NP Taking Active   lisinopril (ZESTRIL) 5 MG tablet [id number] Yes TAKE 1 TABLET BY MOUTH ONCE A DAY Cantwell, Celeste C, PA-C Taking Active   nitroGLYCERIN (NITROSTAT) 0.4 MG SL tablet [id number] Yes DISSOLVE 1 TABLET UNDER TONGUE AS NEEDEDFOR CHEST PAIN. MAY REPEAT 5 MINUTES APART 3 TIMES IF NEEDED Cantwell, Celeste C, PA-C Taking Active   pantoprazole (PROTONIX) 20 MG tablet [id number] Yes TAKE 1 TABLET BY MOUTH TWICE A DAY Cable, Alyson Locket, NP Taking Active   potassium chloride SA (KLOR-CON M) 10 MEQ tablet [id number] Yes Take 1 tablet (10 mEq total) by mouth daily. Cantwell, Celeste C, PA-C Taking Active   rosuvastatin (CRESTOR) 20 MG tablet [id number] Yes TAKE 1 TABLET BY MOUTH ONCE A DAY Cantwell, Celeste C, PA-C Taking Active             Patient Active Problem List   Diagnosis Date Noted   Epigastric pain 05/29/2021   Medicare annual wellness visit, subsequent 11/30/2020   Paresthesia 11/30/2020   Acute  pain of left shoulder 11/30/2020   Dyspnea on  exertion 08/28/2020   PND (post-nasal drip) 08/28/2020   Sinus pressure 08/28/2020   Unstable angina (HCC)    Acute inferior myocardial infarction ([place]) 03/05/2020   CAD (coronary artery disease), native coronary artery 03/05/2020   Osteoarthritis 07/20/2017   Internal hemorrhoids 05/09/2017   History of adenomatous polyp of colon 02/03/2017   Acute recurrent frontal sinusitis 07/30/2008   Seasonal allergies 11/01/2007   Hyperlipemia 09/06/2006   Hypothyroidism 08/24/2006   Essential hypertension 08/24/2006   GERD 08/24/2006   Osteoporosis 08/24/2006    Immunization History  Administered Date(s) Administered   Fluad Quad(high Dose 65+) 11/04/2019   Influenza Split 11/01/2010, 10/31/2011   Influenza Whole 01/31/2005, 11/01/2007, 12/04/2009   Influenza, High Dose Seasonal PF 11/15/2013, 10/24/2017, 10/12/2018, 11/04/2020   Influenza,inj,Quad PF,6+ Mos 10/16/2012, 11/06/2014, 11/11/2015, 11/01/2016, 10/24/2017   Influenza-Unspecified 11/09/2016   PFIZER(Purple Top)SARS-COV-2 Vaccination 02/17/2019, 03/10/2019, 02/11/2020   Pneumococcal Conjugate-13 05/07/2014   Pneumococcal Polysaccharide-23 01/31/2006   Td 01/31/2006   Zoster, Live 08/08/2013    Conditions to be addressed/monitored:  Hypertension, Hyperlipidemia, Coronary Artery Disease, GERD, Hypothyroidism, and Osteoporosis  Care Plan : [place]  Updates made by Charlton Haws, [place] since 09/09/2021 12:00 AM     Problem: Hypertension, Hyperlipidemia, Coronary Artery Disease, GERD, Hypothyroidism, and Osteoporosis   Priority: High     Long-Range Goal: Disease mgmt   Start Date: 03/18/2021  Expected End Date: 03/19/2022  This Visit's Progress: On track  Recent Progress: On track  Priority: High  Note:   Current Barriers:  Unable to maintain control of BP (low BP)  Pharmacist Clinical Goal(s):  Patient will adhere to plan to optimize therapeutic regimen for BP as evidenced by report of adherence to  recommended medication management changes through collaboration with PharmD and provider.   Interventions: 1:1 collaboration with Michela Pitcher, NP regarding development and update of comprehensive plan of care as evidenced by provider attestation and co-signature Inter-disciplinary care team collaboration (see longitudinal plan of care) Comprehensive medication review performed; medication list updated in electronic medical record  Hypertension (BP goal <140/90) -Controlled - pt reports frequent urination without taking Lasix, she wonders if prednisone can cause this - discussed prednisone can contribute to this; advised pt to finish course of prednisone, monitor urination and let PCP know if frequency persists -Pt does not take lisinopril or carvedilol if BP is low (SBP ~100) -Current home readings:  8/8 100/53, 56 - skipped BP meds 8/3 117/62 8/1 135/59  (120/62 in OV) 7/29 111/52 7/28 98/50 -Current treatment: Carvedilol 3.125 mg BID - Appropriate, Effective, Safe, Accessible Lisinopril 5 mg daily -Appropriate, Effective, Safe, Accessible Furosemide 20 mg daily PRN - Appropriate, Effective, Safe, Accessible Klor Con 20 mEq daily PRN (w/ Lasix) -Appropriate, Effective, Safe, Accessible -Medications previously tried: triamterene/HCTZ  -Current dietary habits: tries to limit salt -Current exercise habits: walks on treadmill ~1 mile -Educated on BP goals and benefits of medications for prevention of heart attack, stroke and kidney damage; Daily salt intake goal < 2300 mg; Importance of home blood pressure monitoring; -Counseled to monitor BP at home daily; consider reducing medication if persistently low  Hyperlipidemia / CAD (LDL goal < 70) -Controlled - LDL improved 114 > 64 after STEMI and adding ezetimibe; she has used NTG once but thinks it was actually heartburn -Hx CAD - STEMI 03/2020 s/p stent (Rec'd DAPT through at least 03/07/21) -Current treatment: Rosuvastatin 20 mg daily AM  -Appropriate, Effective, Safe,  Accessible Ezetimibe 10 mg daily - Appropriate, Effective, Safe, Accessible Nitroglycerin 0.4 mg SL prn -Appropriate, Effective, Safe, Accessible Aspirin 81 mg daily -Appropriate, Effective, Safe, Accessible -Medications previously tried: Brilinta x 1 year -Educated on Cholesterol goals; Benefits of statin for ASCVD risk reduction; -Discussed she will likely come off of Brilinta this month after cardiology f/u; advised she keep taking Brilinta until cardiologist tells her to stop -Recommended to continue current medication  Osteoporosis (Goal prevent fractures) -Controlled - pt started Prolia this year, she is tolerating it well -Last DEXA Scan: 10/01/2018 >> 05/12/21  T-Score femoral neck: -2.5 >> -2.7  T-Score total hip: -1.6 >> -1.7  T-Score forearm radius: -2.1 >> -2.7  10-year probability of major osteoporotic fracture: 37.3%  10-year probability of hip fracture: 27% -Patient is a candidate for pharmacologic treatment due to T-Score -1.0 to -2.5 and 10-year risk of major osteoporotic fracture > 20% and T-Score -1.0 to -2.5 and 10-year risk of hip fracture > 3% -Current treatment  Vitamin D 2000 IU - Appropriate, Effective, Safe, Accessible Prolia injection (first dose 05/28/21) - Appropriate, Query Effective -Medications previously tried: zoledronic acid (x 2 doses 2014, 2015), alendronate > 5 years  -Recommend [phone] units of vitamin D daily. Recommend 1200 mg of calcium daily from dietary and supplemental sources. Recommend weight-bearing and muscle strengthening exercises for building and maintaining bone density. -Recommend to continue current medication; next Prolia dose due after 11/27/21  Hypothyroidism (Goal: maintain TSH in goal range) -Controlled - TSH is low-normal; she takes levothyroxine first thing in AM separate from other meds -Current treatment  Levothyroxine 75 mcg daily - Appropriate, Effective, Safe, Accessible -Recommended to  continue current medication  GERD (Goal: manage symptoms) -Controlled - pt reports taking BID for over a year; she is concerned if she reduces it she will not be able to tell difference between GERD and angina -Current treatment  Pantoprazole 20 mg BID - Appropriate, Effective, Query Safe -PPI can worsen bone density, however given past episodes of chest pain that were determine to be GERD-related, it is more prudent to continue PPI and monitor bone density (repeat DEXA upcoming) -Recommended to continue current medication  Health Maintenance -Vaccine gaps: Shingrix, TDAP  Patient Goals/Self-Care Activities Patient will:  - take medications as prescribed as evidenced by patient report and record review focus on medication adherence by pill box check blood pressure daily, document, and provide at future appointments      Medication Assistance: None required.  Patient affirms current coverage meets needs.  Compliance/Adherence/Medication fill history: Care Gaps: None  Star-Rating Drugs: Lisinopril - PDC 77% Rosuvastatin - PDC 82%  Medication Access: Within the past 30 days, how often has patient missed a dose of medication? 0 Is a pillbox or other method used to improve adherence? Yes  Factors that may affect medication adherence? no barriers identified Are meds synced by current pharmacy? No  Are meds delivered by current pharmacy? No  Does patient experience delays in picking up medications due to transportation concerns? No   [facility] Services Reviewed: Is patient disadvantaged to use [facility]?: No  Current Rx insurance plan: H[employer] Name and location of Current pharmacy:  [place], [place] [address] [place] [address] Phone: [phone] Fax: [phone]  [facility] services reviewed with patient today?: No  Patient requests to transfer care to [facility]?: No  Reason patient declined to change  pharmacies: Loyalty to other pharmacy/Patient preference   Care Plan and Follow Up  Patient Decision:  Patient agrees to Care Plan and Follow-up.  Plan: Telephone follow up appointment with care management team member scheduled for:  6 months  [name], PharmD, BCACP Clinical Pharmacist [place] [place] [facility] at [facility] [phone]

## 2021-09-20 ENCOUNTER — Ambulatory Visit: Payer: PPO | Admitting: Student

## 2021-09-22 ENCOUNTER — Encounter: Payer: Self-pay | Admitting: Internal Medicine

## 2021-09-22 ENCOUNTER — Ambulatory Visit: Payer: PPO | Admitting: Internal Medicine

## 2021-09-22 VITALS — BP 136/67 | HR 48 | Temp 97.9°F | Resp 16 | Ht 63.5 in | Wt 150.8 lb

## 2021-09-22 DIAGNOSIS — I2510 Atherosclerotic heart disease of native coronary artery without angina pectoris: Secondary | ICD-10-CM

## 2021-09-22 DIAGNOSIS — E7800 Pure hypercholesterolemia, unspecified: Secondary | ICD-10-CM

## 2021-09-22 DIAGNOSIS — R001 Bradycardia, unspecified: Secondary | ICD-10-CM

## 2021-09-22 DIAGNOSIS — I10 Essential (primary) hypertension: Secondary | ICD-10-CM

## 2021-09-22 DIAGNOSIS — I252 Old myocardial infarction: Secondary | ICD-10-CM | POA: Insufficient documentation

## 2021-09-22 NOTE — Progress Notes (Signed)
Primary Physician/Referring:  Michela Pitcher, NP  Patient ID: Amy Lin, female    DOB: 30-Apr-1933, 88 y.o.   MRN: [id number]  Chief Complaint  Patient presents with   Coronary Artery Disease   Follow-up    6 months   HPI:    Amy Lin  is a 88 y.o. Caucasian female with history of hypertension, hyperlipidemia, GERD who presented to [facility] emergency department 03/05/2020 with active chest pain and EKG revealing inferior STEMI.  Underwent successful angioplasty and stenting to proximal RCA, with subsequent staged intervention to OM1.  Recommend patient continue DAPT until at least 03/07/2021 if tolerated.   Patient presents for 38-monthfollow-up.  Patient is feeling well overall, denies chest pain, orthopnea, leg edema.  She is taking lasix PRN now when she notices her legs are swollen. She has been feeling a little more tired than usual lately, which could be due to low heart rates. She states her BP at home is pretty low sometimes as well.   Past Medical History:  Diagnosis Date   Allergy    Coronary artery disease    GERD (gastroesophageal reflux disease)    Hyperlipidemia    Hypertension    diuretic for edema at first   Hypothyroidism    [place]    Osteoarthritis, multiple sites    Osteoporosis    Past Surgical History:  Procedure Laterality Date   ABDOMINAL HYSTERECTOMY     BACK SURGERY  1999   Discectomy and Ray cage   BLADDER REPAIR  ~2010   CARDIAC CATHETERIZATION  03/05/2020   CARPAL TUNNEL RELEASE  8/15   COLONOSCOPY W/ BIOPSIES N/A 2016   CORONARY STENT INTERVENTION N/A 03/06/2020   Procedure: CORONARY STENT INTERVENTION;  Surgeon: GAdrian Prows MD;  [facility];  Service: Cardiovascular;  Laterality: N/A;   CORONARY THROMBECTOMY N/A 03/05/2020   Procedure: Coronary Thrombectomy;  Surgeon: GAdrian Prows MD;  [facility] [facility];  Service: Cardiovascular;  Laterality: N/A;   CORONARY/GRAFT ACUTE MI REVASCULARIZATION N/A  03/05/2020   Procedure: Coronary/Graft Acute MI Revascularization;  Surgeon: GAdrian Prows MD;  [place] [facility];  Service: Cardiovascular;  Laterality: N/A;   INTRAVASCULAR ULTRASOUND/IVUS N/A 03/06/2020   Procedure: Intravascular Ultrasound/IVUS;  Surgeon: GAdrian Prows MD;  [facility] [facility];  Service: Cardiovascular;  Laterality: N/A;   LEFT HEART CATH AND CORONARY ANGIOGRAPHY N/A 03/05/2020   Procedure: LEFT HEART CATH AND CORONARY ANGIOGRAPHY;  Surgeon: GAdrian Prows MD;  [facility];  Service: Cardiovascular;  Laterality: N/A;   MENISCECTOMY Left 2/15   Dr NVeverly Fells  REFRACTIVE SURGERY Bilateral 07/2019   UPPER GI ENDOSCOPY N/A Feb 2017   Family History  Problem Relation Age of Onset   Heart disease Mother    Stroke Father    Hypertension Father    Heart disease Father    Diabetes Father     Social History   Tobacco Use   Smoking status: Never   Smokeless tobacco: Never  Substance Use Topics   Alcohol use: No   Marital Status: Divorced   ROS  Review of Systems  Cardiovascular:  Negative for chest pain, claudication, dyspnea on exertion (improving), leg swelling (minimal. stable), near-syncope, orthopnea, palpitations, paroxysmal nocturnal dyspnea and syncope.  Respiratory:  Negative for shortness of breath.   Hematologic/Lymphatic: Does not bruise/bleed easily.  Neurological:  Negative for dizziness.  All other systems reviewed and are negative.   Objective  Blood pressure 136/67, pulse (!) 48, temperature 97.9 F (36.6 C), temperature source Temporal, resp. rate 16, height 5' 3.5" (1.613 m), weight 150 lb 12.8 oz (68.4 kg), SpO2 96 %.     09/22/2021   11:01 AM 09/22/2021   10:49 AM 08/31/2021   11:06 AM  Vitals with BMI  Height  5' 3.5"   Weight  150 lbs 13 oz 151 lbs 8 oz  BMI  53.66   Systolic 440 347 425  Diastolic 67 53 62  Pulse 48 53 48     Physical Exam Vitals reviewed.  Constitutional:      Appearance: Normal appearance.  She is normal weight.  HENT:     Head: Normocephalic and atraumatic.  Neck:     Vascular: No carotid bruit.  Cardiovascular:     Rate and Rhythm: Regular rhythm. Bradycardia present.     Pulses: Intact distal pulses.          Carotid pulses are 2+ on the right side and 2+ on the left side.      Radial pulses are 2+ on the right side and 2+ on the left side.       Femoral pulses are 2+ on the right side and 2+ on the left side.      Popliteal pulses are 2+ on the right side and 2+ on the left side.       Dorsalis pedis pulses are 0 on the right side and 0 on the left side.       Posterior tibial pulses are 0 on the left side.     Heart sounds: S1 normal and S2 normal. Murmur heard.     Midsystolic murmur is present with a grade of 2/6 at the upper right sternal border.     No gallop.  Pulmonary:     Effort: Pulmonary effort is normal.     Breath sounds: Normal breath sounds.  Abdominal:     General: Abdomen is flat. Bowel sounds are normal.     Palpations: Abdomen is soft.  Musculoskeletal:     Right lower leg: No edema (trace, ankle).     Left lower leg: No edema (trace, ankle).  Skin:    General: Skin is warm and dry.  Neurological:     Mental Status: She is alert.   Physical exam unchanged compared to previous office visit.  Laboratory examination:   Recent Labs    11/30/20 1101 03/05/21 1512 05/12/21 1418  NA 141 139 143  K 4.2 4.4 4.4  CL 106 101 104  CO2 _0 GLUCOSE 107* 121* 103*  BUN _1 CREATININE 0.96 1.03* 1.07*  CALCIUM 9.4 9.8 9.3   CrCl cannot be calculated (Patient's most recent lab result is older than the maximum 21 days allowed.).     Latest Ref Rng & Units 05/12/2021    2:18 PM 03/05/2021    3:12 PM 11/30/2020   11:01 AM  CMP  Glucose 70 - 99 mg/dL 103  121  107   BUN 8 - 27 mg/dL _2 Creatinine 0.57 - 1.00 mg/dL 1.07  1.03  0.96   Sodium 134 - 144 mmol/L 143  139  141   Potassium 3.5 - 5.2 mmol/L 4.4  4.4  4.2    Chloride 96 - 106 mmol/L 104  101  106   CO2 20 - 29 mmol/L _3 Calcium 8.7 -  10.3 mg/dL 9.3  9.8  9.4   Total Protein 6.0 - 8.3 g/dL   7.1   Total Bilirubin 0.2 - 1.2 mg/dL   0.6   Alkaline Phos 39 - 117 U/L   54   AST 0 - 37 U/L   16   ALT 0 - 35 U/L   10       Latest Ref Rng & Units 11/30/2020   11:01 AM 08/28/2020   11:04 AM 03/07/2020    2:15 AM  CBC  WBC 4.0 - 10.5 K/uL 7.9  8.6  7.9   Hemoglobin 12.0 - 15.0 g/dL 12.1  12.2  11.7   Hematocrit 36.0 - 46.0 % 36.4  36.3  35.6   Platelets 150.0 - 400.0 K/uL 183.0  150.0  151    Lipid Panel Recent Labs    11/30/20 1101  CHOL 131  TRIG 112.0  LDLCALC 64  VLDL 22.4  HDL 44.70  CHOLHDL 3   HEMOGLOBIN A1C Lab Results  Component Value Date   HGBA1C 6.3 09/25/2019   TSH Recent Labs    11/30/20 1101  TSH 0.39  BNP    Component Value Date/Time   BNP 144.8 (H) 03/05/2021 1512    ProBNP    Component Value Date/Time   PROBNP 156.0 (H) 08/28/2020 1104    External labs:  None   Allergies  No Known Allergies   Medications Prior to Visit:   Outpatient Medications Prior to Visit  Medication Sig Dispense Refill   aspirin 81 MG EC tablet Take 81 mg by mouth daily.     Cholecalciferol (VITAMIN D3) 2000 UNITS TABS Take 1 tablet by mouth every evening.     Cyanocobalamin (VITAMIN B 12 PO) Take 1 tablet by mouth every evening.     ezetimibe (ZETIA) 10 MG tablet TAKE 1 TABLET BY MOUTH ONCE A DAY AFTER SUPPER 90 tablet 2   fluticasone (FLONASE) 50 MCG/ACT nasal spray Place 1 spray into both nostrils daily. 16 g 0   furosemide (LASIX) 20 MG tablet Take 1 tablet (20 mg total) by mouth daily as needed for fluid or edema. 90 tablet 3   hydrocortisone 2.5 % lotion Apply topically 2 (two) times daily. 59 mL 0   levocetirizine (XYZAL) 5 MG tablet Take 5 mg by mouth every evening.     levothyroxine (SYNTHROID) 75 MCG tablet TAKE 1 TABLET BY MOUTH ONCE A DAY ON AN EMPTY STOMACH.TAKE WITH A GLASS OF WATERAT LEAST 30-60  MIN BEFORE BREAKFAST 90 tablet 1   lisinopril (ZESTRIL) 5 MG tablet TAKE 1 TABLET BY MOUTH ONCE A DAY 90 tablet 3   nitroGLYCERIN (NITROSTAT) 0.4 MG SL tablet DISSOLVE 1 TABLET UNDER TONGUE AS NEEDEDFOR CHEST PAIN. MAY REPEAT 5 MINUTES APART 3 TIMES IF NEEDED 25 tablet 3   pantoprazole (PROTONIX) 20 MG tablet TAKE 1 TABLET BY MOUTH TWICE A DAY 180 tablet 2   potassium chloride SA (KLOR-CON M) 10 MEQ tablet Take 1 tablet (10 mEq total) by mouth daily. 90 tablet 3   rosuvastatin (CRESTOR) 20 MG tablet TAKE 1 TABLET BY MOUTH ONCE A DAY 90 tablet 3   carvedilol (COREG) 3.125 MG tablet TAKE 1 TABLET BY MOUTH TWICE A DAY 180 tablet 3   No facility-administered medications prior to visit.   Final Medications at End of Visit    Current Meds  Medication Sig   aspirin 81 MG EC tablet Take 81 mg by mouth daily.   Cholecalciferol (VITAMIN D3) 2000 UNITS  TABS Take 1 tablet by mouth every evening.   Cyanocobalamin (VITAMIN B 12 PO) Take 1 tablet by mouth every evening.   ezetimibe (ZETIA) 10 MG tablet TAKE 1 TABLET BY MOUTH ONCE A DAY AFTER SUPPER   fluticasone (FLONASE) 50 MCG/ACT nasal spray Place 1 spray into both nostrils daily.   furosemide (LASIX) 20 MG tablet Take 1 tablet (20 mg total) by mouth daily as needed for fluid or edema.   hydrocortisone 2.5 % lotion Apply topically 2 (two) times daily.   levocetirizine (XYZAL) 5 MG tablet Take 5 mg by mouth every evening.   levothyroxine (SYNTHROID) 75 MCG tablet TAKE 1 TABLET BY MOUTH ONCE A DAY ON AN EMPTY STOMACH.TAKE WITH A GLASS OF WATERAT LEAST 30-60 MIN BEFORE BREAKFAST   lisinopril (ZESTRIL) 5 MG tablet TAKE 1 TABLET BY MOUTH ONCE A DAY   nitroGLYCERIN (NITROSTAT) 0.4 MG SL tablet DISSOLVE 1 TABLET UNDER TONGUE AS NEEDEDFOR CHEST PAIN. MAY REPEAT 5 MINUTES APART 3 TIMES IF NEEDED   pantoprazole (PROTONIX) 20 MG tablet TAKE 1 TABLET BY MOUTH TWICE A DAY   potassium chloride SA (KLOR-CON M) 10 MEQ tablet Take 1 tablet (10 mEq total) by mouth  daily.   rosuvastatin (CRESTOR) 20 MG tablet TAKE 1 TABLET BY MOUTH ONCE A DAY   [DISCONTINUED] carvedilol (COREG) 3.125 MG tablet TAKE 1 TABLET BY MOUTH TWICE A DAY   Radiology:   No results found.  Cardiac Studies:  PCV ECHOCARDIOGRAM COMPLETE 03/16/2021 Left ventricle cavity is normal in size. Moderate concentric hypertrophy of the left ventricle. Normal global wall motion. Normal LV systolic function with EF 58%. Doppler evidence of grade II (pseudonormal) diastolic dysfunction, elevated LAP. However, this assessment could be affected by moderate MAC. Left atrial cavity is mildly dilated. Trileaflet aortic valve. Moderate aortic valve leaflet calcification. No evidence of aortic stenosis. Mild (Grade I) aortic regurgitation. Moderate aortic valve leaflet calcification. No evidence of mitral stenosis. Mild to moderate mitral regurgitation. Moderate mitral valve leaflet calcification. Mild to moderate tricuspid regurgitation. No evidence of pulmonary hypertension. Compared to previous study on 03/05/2020, LV wall motion abnormality now absent.     Left Heart Catheterization and stenting to RCA 03/05/20:  LV: Normal size.  Inferior akinesis.  LVEF 40%.  No significant MR.  Severe mitral annular calcification evident.  No pressure gradient across the aortic valve. LM: Large vessel, mildly calcified. CX: Very large vessel giving origin to large OM1 small OM 2 very large OM 3.  Ostium of the circumflex has a 30% stenosis.  Very large OM1 with high-grade and irregular marginal 95% stenosis in the proximal segment.  Moderate amount of calcification is evident in the coronary vessels.  Severe tortuosity is evident in the high OM1 and also large OM 3. LAD: Large caliber vessel giving origin to a moderate to large size D1.  Mild luminal irregularities evident. RCA: Large vessel and dominant vessel.  Tortuous in the mid to distal segment.  It is occluded the proximal segment. Successful aspiration  thrombectomy with Pronto V4 catheter followed by stenting with 4.0 x 30 mm resolute Onyx DES, stenosis reduced from 100% to 0% and TIMI 0 to TIMI-3 flow.    Coronary angioplasty to OM1 03/06/2020: OCT guided PTCA and stenting of the large OM1 with 2.5 x 15 mm resolute Onyx DES, stenosis reduced from 99% to 0%, >95% stent apposition and expansion.  No edge dissection.   Recommendation: Patient will hopefully be discharged home tomorrow if she remains stable.  She  will need dual antiplatelet therapy for a year in view of ACS.  Continue risk factor modification is indicated.  115 mL contrast utilized.   EKG:   09/22/2021: marked sinus bradycardia, rate 48  03/23/2021: Sinus rhythm at a rate of 60 bpm.  Normal axis.  Borderline first-degree AV block.  Inferior infarct old.  Poor R wave progression, cannot exclude anteroseptal   03/13/2020: Sinus bradycardia rate of 52 bpm, with borderline first-degree AV block..  Inferior infarct old.  Poor R wave progression, cannot exclude anteroseptal infarct old.  T wave abnormality, unchanged from previous.  Low voltage complexes, consider pulmonary disease pattern.  EKG 03/07/2020: Normal sinus rhythm with rate of 73 bpm, normal axis, T wave abnormality, consider inferolateral ischemia.  Compared to 03/06/2020, no significant change.  Assessment     ICD-10-CM   1. Coronary artery disease involving native coronary artery of native heart without angina pectoris  I25.10 EKG 12-Lead    2. Essential hypertension  I10     3. History of ST elevation myocardial infarction (STEMI)  I25.2     4. Hypercholesterolemia  E78.00     5. Bradycardia  R00.1        Medications Discontinued During This Encounter  Medication Reason   carvedilol (COREG) 3.125 MG tablet      No orders of the defined types were placed in this encounter.    Recommendations:   Amy Lin is a 88 y.o. Caucasian female with history of hypertension, hyperlipidemia, GERD who presented to  [facility] emergency department 03/05/2020 with active chest pain and EKG revealing inferior STEMI.  Underwent successful angioplasty and stenting to proximal RCA, with subsequent staged intervention to OM1.  Recommend patient continue DAPT until at least 03/07/2021 if tolerated.   CAD Continue ASA, stopped Plavix in 03/2021 Stop Coreg due to symptomatic bradycardia Nitro PRN for chest pain  Bradycardia Stop Coreg due to symptomatic bradycardia Taken for 18 months post stenting, max benefit 1 year post ACS  Hypertension Continue lisinopril, take at nighttime Lasix PRN for leg swelling  Hyperlipidemia Continue crestor   Follow-up in 6 months, sooner if needed.   Floydene Flock, PA-C 09/22/2021, 11:30 AM Office: [phone]

## 2021-09-30 ENCOUNTER — Other Ambulatory Visit: Payer: Self-pay | Admitting: Nurse Practitioner

## 2021-09-30 DIAGNOSIS — Z1231 Encounter for screening mammogram for malignant neoplasm of breast: Secondary | ICD-10-CM

## 2021-10-19 ENCOUNTER — Telehealth: Payer: Self-pay

## 2021-10-19 NOTE — Chronic Care Management (AMB) (Addendum)
Chronic Care Management Pharmacy Assistant   Name: Amy Lin  MRN: [id number] DOB: Apr 15, 1933  Reason for Encounter:Hypertension  Disease State  Recent office visits:  08/31/21-[name],NP(PCP)-acute frontal sinusitis, start doxycycline '100mg'$  and prednisone '20mg'$  taper dose.  Recent consult visits:  09/22/21-Sabina Custovic,DO(cardio)-f/u CAD-Stop Coreg due to symptomatic bradycardia ,taken for 18 months post stenting, max benefit 1 year post ACS,f/u 6 months  Hospital visits:  None in previous 6 months  Medications: Outpatient Encounter Medications as of 10/19/2021  Medication Sig   aspirin 81 MG EC tablet Take 81 mg by mouth daily.   Cholecalciferol (VITAMIN D3) 2000 UNITS TABS Take 1 tablet by mouth every evening.   Cyanocobalamin (VITAMIN B 12 PO) Take 1 tablet by mouth every evening.   ezetimibe (ZETIA) 10 MG tablet TAKE 1 TABLET BY MOUTH ONCE A DAY AFTER SUPPER   fluticasone (FLONASE) 50 MCG/ACT nasal spray Place 1 spray into both nostrils daily.   furosemide (LASIX) 20 MG tablet Take 1 tablet (20 mg total) by mouth daily as needed for fluid or edema.   hydrocortisone 2.5 % lotion Apply topically 2 (two) times daily.   levocetirizine (XYZAL) 5 MG tablet Take 5 mg by mouth every evening.   levothyroxine (SYNTHROID) 75 MCG tablet TAKE 1 TABLET BY MOUTH ONCE A DAY ON AN EMPTY STOMACH.TAKE WITH A GLASS OF WATERAT LEAST 30-60 MIN BEFORE BREAKFAST   lisinopril (ZESTRIL) 5 MG tablet TAKE 1 TABLET BY MOUTH ONCE A DAY   nitroGLYCERIN (NITROSTAT) 0.4 MG SL tablet DISSOLVE 1 TABLET UNDER TONGUE AS NEEDEDFOR CHEST PAIN. MAY REPEAT 5 MINUTES APART 3 TIMES IF NEEDED   pantoprazole (PROTONIX) 20 MG tablet TAKE 1 TABLET BY MOUTH TWICE A DAY   potassium chloride SA (KLOR-CON M) 10 MEQ tablet Take 1 tablet (10 mEq total) by mouth daily.   rosuvastatin (CRESTOR) 20 MG tablet TAKE 1 TABLET BY MOUTH ONCE A DAY   No facility-administered encounter medications on file as of 10/19/2021.     Recent Office Vitals: BP Readings from Last 3 Encounters:  09/22/21 136/67  08/31/21 120/62  07/30/21 130/62   Pulse Readings from Last 3 Encounters:  09/22/21 (!) 48  08/31/21 (!) 48  07/30/21 (!) 58    Wt Readings from Last 3 Encounters:  09/22/21 150 lb 12.8 oz (68.4 kg)  08/31/21 151 lb 8 oz (68.7 kg)  07/30/21 149 lb 9 oz (67.8 kg)     Kidney Function Lab Results  Component Value Date/Time   CREATININE 1.07 (H) 05/12/2021 02:18 PM   CREATININE 1.03 (H) 03/05/2021 03:12 PM   CREATININE 0.91 (H) 07/19/2019 04:13 PM   CREATININE 1.11 (H) 07/15/2016 04:59 PM   GFR 54.21 (L) 11/30/2020 11:01 AM   GFRNONAA 47 (L) 03/07/2020 02:15 AM   GFRAA 91 03/12/2008 09:28 AM       Latest Ref Rng & Units 05/12/2021    2:18 PM 03/05/2021    3:12 PM 11/30/2020   11:01 AM  BMP  Glucose 70 - 99 mg/dL 103  121  107   BUN 8 - 27 mg/dL '20  27  15   '$ Creatinine 0.57 - 1.00 mg/dL 1.07  1.03  0.96   BUN/Creat Ratio 12 - '28 19  26    '$ Sodium 134 - 144 mmol/L 143  139  141   Potassium 3.5 - 5.2 mmol/L 4.4  4.4  4.2   Chloride 96 - 106 mmol/L 104  101  106   CO2 20 -  29 mmol/L '25  26  25   '$ Calcium 8.7 - 10.3 mg/dL 9.3  9.8  9.4      Contacted patient on 10/26/21 to discuss hypertension disease state   Current antihypertensive regimen:  Lisinopril 5 mg daily  Furosemide 20 mg daily PRN  Klor Con 20 mEq daily PRN (w/ Lasix)    Patient verbally confirms she is taking the above medications as directed. Yes  How often are you checking your Blood Pressure? daily  she checks her blood pressure in the morning after taking her medication.  Current home BP readings:              BP               PULSE   98/56  -  Skipped lisinopril   126/75  -   105/61  -   140/82  -  Patient was on vacation last week and did not take BP as often  Wrist or arm cuff: arm cuff Caffeine intake:  uses decaff. Salt intake:limits adding to food Over the counter medications including pseudoephedrine or  NSAIDs? '81mg'$ .aspirin  Any readings above 180/120? No  What recent interventions/DTPs have been made by any provider to improve Blood Pressure control since last CPP Visit:   Counseled to monitor BP at home daily; consider reducing medication if persistently low- cardiology stopped Carvedilol, patient can take additional lisinopril for high BP Any recent hospitalizations or ED visits since last visit with CPP? No  What diet changes have been made to improve Blood Pressure Control?  Patient limits salt intake, increased water intake, cut out soda  What exercise is being done to improve your Blood Pressure Control?   Enjoys walking for exercise  Adherence Review: Is the patient currently on ACE/ARB medication? Yes Does the patient have >5 day gap between last estimated fill dates? Yes Rosuvastatin ready for pick up now.  Summary of recommendations from last CCM Pharmacy visit (Date:09/09/21) Summary: CCM F/U visit -Pt reports frequent urination recently (~1 week) and she has not been using Lasix; she has been on prednisone and this could be contributing -Pt reports soft BP fairly often, she skips doses of BP meds when SBP is ~100   Recommendations/Changes made from today's visit: -Advised to finish course of prednisone; monitor urination and contact PCP if it does not improve -Advised pt to keep track of how often she skips BP meds; if it is >once a week, consider reducing lisinopril or carvedilol -Next Prolia injection due after 11/27/21  Star Rating Drugs:  Medication:  Last Fill: Day Supply Lisinopril '5mg'$   08/16/21 90 Rosuvastatin '20mg'$  05/17/21 90  Care Gaps: Annual wellness visit in last year? Yes Most Recent BP reading:136/67  09/22/21  Upcoming appointments: PCP appointment on 11/29/21  [name], CPP notified  [name], [place]  [phone]

## 2021-11-05 ENCOUNTER — Ambulatory Visit
Admission: RE | Admit: 2021-11-05 | Discharge: 2021-11-05 | Disposition: A | Discharge status: Home / Self Care | Payer: PPO | Source: Ambulatory Visit | Attending: Nurse Practitioner | Admitting: Nurse Practitioner

## 2021-11-05 DIAGNOSIS — Z1231 Encounter for screening mammogram for malignant neoplasm of breast: Secondary | ICD-10-CM

## 2021-11-18 ENCOUNTER — Telehealth: Payer: Self-pay

## 2021-11-18 DIAGNOSIS — M810 Age-related osteoporosis without current pathological fracture: Secondary | ICD-10-CM

## 2021-11-18 NOTE — Telephone Encounter (Signed)
Left message to call back Next injection due 11/28/21 or after. Has OV on 11/29/21 with PCP and we could do injection then but needs labs ahead of time. OOP cost 20%-around $315.

## 2021-11-19 NOTE — Telephone Encounter (Signed)
Patient advised. Lab on 11/22/21. Prolia will be done on 11/29/21

## 2021-11-22 ENCOUNTER — Other Ambulatory Visit (INDEPENDENT_AMBULATORY_CARE_PROVIDER_SITE_OTHER): Payer: PPO

## 2021-11-22 DIAGNOSIS — M810 Age-related osteoporosis without current pathological fracture: Secondary | ICD-10-CM

## 2021-11-22 LAB — BASIC METABOLIC PANEL
BUN: 16 mg/dL (ref 6–23)
CO2: 27 mEq/L (ref 19–32)
Calcium: 9.6 mg/dL (ref 8.4–10.5)
Chloride: 104 mEq/L (ref 96–112)
Creatinine, Ser: 0.83 mg/dL (ref 0.40–1.20)
GFR: 64.11 mL/min (ref >60.00)
Glucose, Bld: 87 mg/dL (ref 70–99)
Potassium: 4.2 mEq/L (ref 3.5–5.1)
Sodium: 140 mEq/L (ref 135–145)

## 2021-11-25 NOTE — Telephone Encounter (Signed)
CrCl is 53.51 mL/min. Calcium 9.6

## 2021-11-26 ENCOUNTER — Ambulatory Visit (INDEPENDENT_AMBULATORY_CARE_PROVIDER_SITE_OTHER): Payer: PPO | Admitting: Nurse Practitioner

## 2021-11-26 VITALS — BP 124/72 | HR 65 | Temp 97.2°F | Resp 14 | Ht 63.5 in | Wt 152.5 lb

## 2021-11-26 DIAGNOSIS — I252 Old myocardial infarction: Secondary | ICD-10-CM

## 2021-11-26 DIAGNOSIS — E7800 Pure hypercholesterolemia, unspecified: Secondary | ICD-10-CM

## 2021-11-26 DIAGNOSIS — E039 Hypothyroidism, unspecified: Secondary | ICD-10-CM

## 2021-11-26 DIAGNOSIS — I10 Essential (primary) hypertension: Secondary | ICD-10-CM | POA: Diagnosis not present

## 2021-11-26 DIAGNOSIS — M810 Age-related osteoporosis without current pathological fracture: Secondary | ICD-10-CM

## 2021-11-26 LAB — HEPATIC FUNCTION PANEL
ALT: 9 U/L (ref 0–35)
AST: 16 U/L (ref 0–37)
Albumin: 3.8 g/dL (ref 3.5–5.2)
Alkaline Phosphatase: 49 U/L (ref 39–117)
Bilirubin, Direct: 0.1 mg/dL (ref 0.0–0.3)
Total Bilirubin: 0.4 mg/dL (ref 0.2–1.2)
Total Protein: 6.9 g/dL (ref 6.0–8.3)

## 2021-11-26 LAB — LIPID PANEL
Cholesterol: 166 mg/dL (ref 0–200)
HDL: 52.2 mg/dL (ref >39.00)
LDL Cholesterol: 88 mg/dL (ref 0–99)
NonHDL: 114.21
Total CHOL/HDL Ratio: 3
Triglycerides: 131 mg/dL (ref 0.0–149.0)
VLDL: 26.2 mg/dL (ref 0.0–40.0)

## 2021-11-26 LAB — CBC
HCT: 36.7 % (ref 36.0–46.0)
Hemoglobin: 12.1 g/dL (ref 12.0–15.0)
MCHC: 33.1 g/dL (ref 30.0–36.0)
MCV: 94.4 fl (ref 78.0–100.0)
Platelets: 172 10*3/uL (ref 150.0–400.0)
RBC: 3.89 Mil/uL (ref 3.87–5.11)
RDW: 13.6 % (ref 11.5–15.5)
WBC: 6.2 10*3/uL (ref 4.0–10.5)

## 2021-11-26 LAB — TSH: TSH: 4.26 u[IU]/mL (ref 0.35–5.50)

## 2021-11-26 NOTE — Progress Notes (Signed)
Established Patient Office Visit  Subjective   Patient ID: [place] PAONE, female    DOB: Aug 04, 1933  Age: 88 y.o. MRN: [id number]  Chief Complaint  Patient presents with   Follow-up    6 months follow up    HPI     HTN: States that she does check blood pressure every couple of days. States that she take the lisinopril. Lasix is as needed. States that she has not taken it in 2 weeks. She gauges her fluid balance off of swelling in her feet and ankles.  Or if she has dyspnea on exertion.   Osteoporosis: on prolia will be due at the beginning of the week.  Hip pain: States that last week her hip started hurting . Right hip. Was bad on Monday and Tuesday was the worse. State movedment, standing and position changes make it worse. Sititng still helped States that she did not take any medicaiton. Better now. No numbness/tingling. States felt weak because of pain     Review of Systems  Constitutional:  Negative for chills and fever.  Respiratory:  Negative for shortness of breath.   Cardiovascular:  Negative for chest pain.  Neurological:  Negative for headaches.      Objective:     BP 124/72   Pulse 65   Temp (!) 97.2 F (36.2 C) (Temporal)   Resp 14   Ht 5' 3.5" (1.613 m)   Wt 152 lb 8 oz (69.2 kg)   SpO2 96%   BMI 26.59 kg/m    Physical Exam Vitals and nursing note reviewed.  Constitutional:      Appearance: Normal appearance.  Cardiovascular:     Rate and Rhythm: Normal rate and regular rhythm.     Heart sounds: Normal heart sounds.  Pulmonary:     Effort: Pulmonary effort is normal.     Breath sounds: Normal breath sounds.  Lymphadenopathy:     Cervical: No cervical adenopathy.  Neurological:     Mental Status: She is alert.      No results found for any visits on 11/26/21.    The ASCVD Risk score (Arnett DK, et al., 2019) failed to calculate for the following reasons:   The 2019 ASCVD risk score is only valid for ages 13 to 85   The patient has  a prior MI or stroke diagnosis    Assessment & Plan:   Problem List Items Addressed This Visit       Cardiovascular and Mediastinum   Essential hypertension - Primary    Patient's blood pressure well controlled in office today.  Continue taking lisinopril 5 mg.  She is also on Lasix 20 mg as needed edema.      Relevant Orders   CBC   Hepatic function panel   Lipid panel     Endocrine   Hypothyroidism    Pending TSH today.      Relevant Orders   TSH     Musculoskeletal and Integument   Osteoporosis    Currently maintained on Prolia.  Due for injection within the next week.  Nurse visit scheduled in computer.        Other   Hypercholesterolemia    Patient currently maintained on rosuvastatin and Zetia.  Pending labs      Relevant Orders   Hepatic function panel   Lipid panel   History of ST elevation myocardial infarction (STEMI)    Return in about 6 months (around 05/28/2022) for CPE and labs.  Romilda Garret, NP

## 2021-11-26 NOTE — Assessment & Plan Note (Signed)
Currently maintained on Prolia.  Due for injection within the next week.  Nurse visit scheduled in computer.

## 2021-11-26 NOTE — Assessment & Plan Note (Signed)
Pending TSH today.

## 2021-11-26 NOTE — Assessment & Plan Note (Signed)
Patient currently maintained on rosuvastatin and Zetia.  Pending labs

## 2021-11-26 NOTE — Assessment & Plan Note (Signed)
Patient's blood pressure well controlled in office today.  Continue taking lisinopril 5 mg.  She is also on Lasix 20 mg as needed edema.

## 2021-11-26 NOTE — Patient Instructions (Addendum)
Nice to see you today  Nurse visit on Thursday  12/02/2021 at 2pm for your prolia injection   I want to see you in 6 month for your physical, sooner if you need me

## 2021-11-29 ENCOUNTER — Ambulatory Visit: Payer: PPO | Admitting: Nurse Practitioner

## 2021-11-29 ENCOUNTER — Telehealth: Payer: Self-pay | Admitting: Nurse Practitioner

## 2021-11-29 NOTE — Telephone Encounter (Signed)
Left message for patient to call back and schedule Medicare Annual Wellness Visit (AWV) either virtually or phone. Left  my jabber number [phone]   Last AWV  11/30/20    45 min for awv-i and in office appointments 30 min for awv-s  phone/virtual appointments

## 2021-11-30 NOTE — Progress Notes (Signed)
That sounds good thank you!

## 2021-12-01 ENCOUNTER — Ambulatory Visit (INDEPENDENT_AMBULATORY_CARE_PROVIDER_SITE_OTHER): Payer: PPO

## 2021-12-01 VITALS — Ht 63.5 in | Wt 152.0 lb

## 2021-12-01 DIAGNOSIS — Z0000 Encounter for general adult medical examination without abnormal findings: Secondary | ICD-10-CM | POA: Diagnosis not present

## 2021-12-01 NOTE — Patient Instructions (Addendum)
[name] , Thank you for taking time to come for your Medicare Wellness Visit. I appreciate your ongoing commitment to your health goals. Please review the following plan we discussed and let me know if I can assist you in the future.   These are the goals we discussed:  Goals       Increase physical activity      When weather permits, I will walk outside 30 min daily. Otherwise, I will walk on my treadmill for 30 min.       Manage My Medicine      Timeframe:  Long-Range Goal Priority:  Medium Start Date:     03/18/21                        Expected End Date: 03/18/22                      Follow Up Date August 2023   - call for medicine refill 2 or 3 days before it runs out - call if I am sick and can't take my medicine - keep a list of all the medicines I take; vitamins and herbals too - use a pillbox to sort medicine    Why is this important?   These steps will help you keep on track with your medicines.   Notes:       Stay healthy (pt-stated)        This is a list of the screening recommended for you and due dates:  Health Maintenance  Topic Date Due   COVID-19 Vaccine (4 - Pfizer series) 12/17/2021*   Zoster (Shingles) Vaccine (1 of 2) 02/26/2022*   Flu Shot  05/01/2022*   Tetanus Vaccine  12/02/2022*   Medicare Annual Wellness Visit  12/02/2022   Pneumonia Vaccine  Completed   DEXA scan (bone density measurement)  Completed   HPV Vaccine  Aged Out  *Topic was postponed. The date shown is not the original due date.    Advanced directives: Advance directive discussed with you today. Even though you declined this today, please call our office should you change your mind, and we can give you the proper paperwork for you to fill out.   Conditions/risks identified: None  Next appointment: Follow up in one year for your annual wellness visit     Preventive Care 65 Years and Older, Female Preventive care refers to lifestyle choices and visits with your health care  provider that can promote health and wellness. What does preventive care include? A yearly physical exam. This is also called an annual well check. Dental exams once or twice a year. Routine eye exams. Ask your health care provider how often you should have your eyes checked. Personal lifestyle choices, including: Daily care of your teeth and gums. Regular physical activity. Eating a healthy diet. Avoiding tobacco and drug use. Limiting alcohol use. Practicing safe sex. Taking low-dose aspirin every day. Taking vitamin and mineral supplements as recommended by your health care provider. What happens during an annual well check? The services and screenings done by your health care provider during your annual well check will depend on your age, overall health, lifestyle risk factors, and family history of disease. Counseling  Your health care provider may ask you questions about your: Alcohol use. Tobacco use. Drug use. Emotional well-being. Home and relationship well-being. Sexual activity. Eating habits. History of falls. Memory and ability to understand (cognition). Work and work Statistician. Reproductive  health. Screening  You may have the following tests or measurements: Height, weight, and BMI. Blood pressure. Lipid and cholesterol levels. These may be checked every 5 years, or more frequently if you are over 4 years old. Skin check. Lung cancer screening. You may have this screening every year starting at age 14 if you have a 30-pack-year history of smoking and currently smoke or have quit within the past 15 years. Fecal occult blood test (FOBT) of the stool. You may have this test every year starting at age 3. Flexible sigmoidoscopy or colonoscopy. You may have a sigmoidoscopy every 5 years or a colonoscopy every 10 years starting at age 67. Hepatitis C blood test. Hepatitis B blood test. Sexually transmitted disease (STD) testing. Diabetes screening. This is done by  checking your blood sugar (glucose) after you have not eaten for a while (fasting). You may have this done every 1-3 years. Bone density scan. This is done to screen for osteoporosis. You may have this done starting at age 60. Mammogram. This may be done every 1-2 years. Talk to your health care provider about how often you should have regular mammograms. Talk with your health care provider about your test results, treatment options, and if necessary, the need for more tests. Vaccines  Your health care provider may recommend certain vaccines, such as: Influenza vaccine. This is recommended every year. Tetanus, diphtheria, and acellular pertussis (Tdap, Td) vaccine. You may need a Td booster every 10 years. Zoster vaccine. You may need this after age 37. Pneumococcal 13-valent conjugate (PCV13) vaccine. One dose is recommended after age 31. Pneumococcal polysaccharide (PPSV23) vaccine. One dose is recommended after age 22. Talk to your health care provider about which screenings and vaccines you need and how often you need them. This information is not intended to replace advice given to you by your health care provider. Make sure you discuss any questions you have with your health care provider. Document Released: 02/13/2015 Document Revised: 10/07/2015 Document Reviewed: 11/18/2014 Elsevier Interactive Patient Education  2017 [place] Prevention in the Home Falls can cause injuries. They can happen to people of all ages. There are many things you can do to make your home safe and to help prevent falls. What can I do on the outside of my home? Regularly fix the edges of walkways and driveways and fix any cracks. Remove anything that might make you trip as you walk through a door, such as a raised step or threshold. Trim any bushes or trees on the path to your home. Use bright outdoor lighting. Clear any walking paths of anything that might make someone trip, such as rocks or  tools. Regularly check to see if handrails are loose or broken. Make sure that both sides of any steps have handrails. Any raised decks and porches should have guardrails on the edges. Have any leaves, snow, or ice cleared regularly. Use sand or salt on walking paths during winter. Clean up any spills in your garage right away. This includes oil or grease spills. What can I do in the bathroom? Use night lights. Install grab bars by the toilet and in the tub and shower. Do not use towel bars as grab bars. Use non-skid mats or decals in the tub or shower. If you need to sit down in the shower, use a plastic, non-slip stool. Keep the floor dry. Clean up any water that spills on the floor as soon as it happens. Remove soap buildup in the tub  or shower regularly. Attach bath mats securely with double-sided non-slip rug tape. Do not have throw rugs and other things on the floor that can make you trip. What can I do in the bedroom? Use night lights. Make sure that you have a light by your bed that is easy to reach. Do not use any sheets or blankets that are too big for your bed. They should not hang down onto the floor. Have a firm chair that has side arms. You can use this for support while you get dressed. Do not have throw rugs and other things on the floor that can make you trip. What can I do in the kitchen? Clean up any spills right away. Avoid walking on wet floors. Keep items that you use a lot in easy-to-reach places. If you need to reach something above you, use a strong step stool that has a grab bar. Keep electrical cords out of the way. Do not use floor polish or wax that makes floors slippery. If you must use wax, use non-skid floor wax. Do not have throw rugs and other things on the floor that can make you trip. What can I do with my stairs? Do not leave any items on the stairs. Make sure that there are handrails on both sides of the stairs and use them. Fix handrails that are  broken or loose. Make sure that handrails are as long as the stairways. Check any carpeting to make sure that it is firmly attached to the stairs. Fix any carpet that is loose or worn. Avoid having throw rugs at the top or bottom of the stairs. If you do have throw rugs, attach them to the floor with carpet tape. Make sure that you have a light switch at the top of the stairs and the bottom of the stairs. If you do not have them, ask someone to add them for you. What else can I do to help prevent falls? Wear shoes that: Do not have high heels. Have rubber bottoms. Are comfortable and fit you well. Are closed at the toe. Do not wear sandals. If you use a stepladder: Make sure that it is fully opened. Do not climb a closed stepladder. Make sure that both sides of the stepladder are locked into place. Ask someone to hold it for you, if possible. Clearly mark and make sure that you can see: Any grab bars or handrails. First and last steps. Where the edge of each step is. Use tools that help you move around (mobility aids) if they are needed. These include: Canes. Walkers. Scooters. Crutches. Turn on the lights when you go into a dark area. Replace any light bulbs as soon as they burn out. Set up your furniture so you have a clear path. Avoid moving your furniture around. If any of your floors are uneven, fix them. If there are any pets around you, be aware of where they are. Review your medicines with your doctor. Some medicines can make you feel dizzy. This can increase your chance of falling. Ask your doctor what other things that you can do to help prevent falls. This information is not intended to replace advice given to you by your health care provider. Make sure you discuss any questions you have with your health care provider. Document Released: 11/13/2008 Document Revised: 06/25/2015 Document Reviewed: 02/21/2014 Elsevier Interactive Patient Education  2017 [employer].

## 2021-12-01 NOTE — Progress Notes (Signed)
Subjective:   Amy Lin is a 88 y.o. female who presents for Medicare Annual (Subsequent) preventive examination.  Review of Systems    Virtual Visit via Telephone Note  I connected with  Amy Lin on 12/01/21 at  9:00 AM EDT by telephone and verified that I am speaking with the correct person using two identifiers.  Location: Patient: Home Provider: Office Persons participating in the virtual visit: patient/Nurse Health Advisor   I discussed the limitations, risks, security and privacy concerns of performing an evaluation and management service by telephone and the availability of in person appointments. The patient expressed understanding and agreed to proceed.  Interactive audio and video telecommunications were attempted between this nurse and patient, however failed, due to patient having technical difficulties OR patient did not have access to video capability.  We continued and completed visit with audio only.  Some vital signs may be absent or patient reported.   Criselda Peaches, LPN  Cardiac Risk Factors include: advanced age (>90mn, >>20women);hypertension     Objective:    Today's Vitals   12/01/21 0848  Weight: 152 lb (68.9 kg)  Height: 5' 3.5" (1.613 m)   Body mass index is 26.5 kg/m.     12/01/2021    8:56 AM 03/20/2020    1:34 PM 03/05/2020    3:04 PM 11/15/2019    7:04 PM 04/14/2015   12:05 PM 05/07/2014   12:03 PM  Advanced Directives  Does Patient Have a Medical Advance Directive? No No No No No No  Would patient like information on creating a medical advance directive? No - Patient declined No - Patient declined No - Patient declined No - Patient declined Yes - Educational materials given No - patient declined information    Current Medications (verified) Outpatient Encounter Medications as of 12/01/2021  Medication Sig   aspirin 81 MG EC tablet Take 81 mg by mouth daily.   Cholecalciferol (VITAMIN D3) 2000 UNITS TABS Take 1 tablet by mouth  every evening.   Cyanocobalamin (VITAMIN B 12 PO) Take 1 tablet by mouth every evening.   ezetimibe (ZETIA) 10 MG tablet TAKE 1 TABLET BY MOUTH ONCE A DAY AFTER SUPPER   fluticasone (FLONASE) 50 MCG/ACT nasal spray Place 1 spray into both nostrils daily.   furosemide (LASIX) 20 MG tablet Take 1 tablet (20 mg total) by mouth daily as needed for fluid or edema.   hydrocortisone 2.5 % lotion Apply topically 2 (two) times daily.   levocetirizine (XYZAL) 5 MG tablet Take 5 mg by mouth every evening.   levothyroxine (SYNTHROID) 75 MCG tablet TAKE 1 TABLET BY MOUTH ONCE A DAY ON AN EMPTY STOMACH.TAKE WITH A GLASS OF WATERAT LEAST 30-60 MIN BEFORE BREAKFAST   lisinopril (ZESTRIL) 5 MG tablet TAKE 1 TABLET BY MOUTH ONCE A DAY   nitroGLYCERIN (NITROSTAT) 0.4 MG SL tablet DISSOLVE 1 TABLET UNDER TONGUE AS NEEDEDFOR CHEST PAIN. MAY REPEAT 5 MINUTES APART 3 TIMES IF NEEDED   pantoprazole (PROTONIX) 20 MG tablet TAKE 1 TABLET BY MOUTH TWICE A DAY   potassium chloride SA (KLOR-CON M) 10 MEQ tablet Take 1 tablet (10 mEq total) by mouth daily.   rosuvastatin (CRESTOR) 20 MG tablet TAKE 1 TABLET BY MOUTH ONCE A DAY   No facility-administered encounter medications on file as of 12/01/2021.    Allergies (verified) Patient has no known allergies.   History: Past Medical History:  Diagnosis Date   Allergy    Coronary artery disease  GERD (gastroesophageal reflux disease)    Hyperlipidemia    Hypertension    diuretic for edema at first   Hypothyroidism    Myocardial infarction ([place])    Osteoarthritis, multiple sites    Osteoporosis    Past Surgical History:  Procedure Laterality Date   ABDOMINAL HYSTERECTOMY     BACK SURGERY  1999   Discectomy and Ray cage   BLADDER REPAIR  ~2010   CARDIAC CATHETERIZATION  03/05/2020   CARPAL TUNNEL RELEASE  8/15   COLONOSCOPY W/ BIOPSIES N/A 2016   CORONARY STENT INTERVENTION N/A 03/06/2020   Procedure: CORONARY STENT INTERVENTION;  Surgeon: Adrian Prows, MD;   Location: [facility];  Service: Cardiovascular;  Laterality: N/A;   CORONARY THROMBECTOMY N/A 03/05/2020   Procedure: Coronary Thrombectomy;  Surgeon: Adrian Prows, MD;  Location: [facility];  Service: Cardiovascular;  Laterality: N/A;   CORONARY/GRAFT ACUTE MI REVASCULARIZATION N/A 03/05/2020   Procedure: Coronary/Graft Acute MI Revascularization;  Surgeon: Adrian Prows, MD;  Location: [facility];  Service: Cardiovascular;  Laterality: N/A;   INTRAVASCULAR ULTRASOUND/IVUS N/A 03/06/2020   Procedure: Intravascular Ultrasound/IVUS;  Surgeon: Adrian Prows, MD;  Location: [facility];  Service: Cardiovascular;  Laterality: N/A;   LEFT HEART CATH AND CORONARY ANGIOGRAPHY N/A 03/05/2020   Procedure: LEFT HEART CATH AND CORONARY ANGIOGRAPHY;  Surgeon: Adrian Prows, MD;  Location: [facility];  Service: Cardiovascular;  Laterality: N/A;   MENISCECTOMY Left 2/15   Dr Veverly Fells   REFRACTIVE SURGERY Bilateral 07/2019   UPPER GI ENDOSCOPY N/A Feb 2017   Family History  Problem Relation Age of Onset   Heart disease Mother    Stroke Father    Hypertension Father    Heart disease Father    Diabetes Father    Breast cancer Neg Hx    Social History   Socioeconomic History   Marital status: Divorced    Spouse name: Not on file   Number of children: 3   Years of education: Not on file   Highest education level: Not on file  Occupational History   Occupation: Audiological scientist    Comment: [employer]  Tobacco Use   Smoking status: Never   Smokeless tobacco: Never  Scientific laboratory technician Use: Never used  Substance and Sexual Activity   Alcohol use: No   Drug use: No   Sexual activity: Never  Other Topics Concern   Not on file  Social History Narrative   3 children--- 1 died   Lives alone      No living will   Requests son and daughter as health care POAs   Would accept resuscitation attempts but no prolonged artificial ventilation   Not sure about tube feeds   Social  Determinants of Health   Financial Resource Strain: Low Risk  (12/01/2021)   Overall Financial Resource Strain (CARDIA)    Difficulty of Paying Living Expenses: Not hard at all  Food Insecurity: No Food Insecurity (12/01/2021)   Hunger Vital Sign    Worried About Running Out of Food in the Last Year: Never true    [place] in the Last Year: Never true  Transportation Needs: No Transportation Needs (12/01/2021)   PRAPARE - Hydrologist (Medical): No    Lack of Transportation (Non-Medical): No  Physical Activity: Inactive (12/01/2021)   Exercise Vital Sign    Days of Exercise per Week: 0 days    Minutes  of Exercise per Session: 0 min  Stress: No Stress Concern Present (12/01/2021)   [place]    Feeling of Stress : Not at all  Social Connections: Moderately Integrated (12/01/2021)   Social Connection and Isolation Panel [NHANES]    Frequency of Communication with Friends and Family: More than three times a week    Frequency of Social Gatherings with Friends and Family: More than three times a week    Attends Religious Services: More than 4 times per year    Active Member of [employer] or Organizations: Yes    Attends Music therapist: More than 4 times per year    Marital Status: Divorced    Tobacco Counseling Counseling given: Not Answered   Clinical Intake:  Pre-visit preparation completed: No  Pain : No/denies pain     BMI - recorded: 26.5 Nutritional Status: BMI 25 -29 Overweight Nutritional Risks: None Diabetes: No  How often do you need to have someone help you when you read instructions, pamphlets, or other written materials from your doctor or pharmacy?: 1 - Never  Diabetic?  No  Interpreter Needed?: No  Information entered by :: Rolene Arbour LPN   Activities of Daily Living    12/01/2021    8:55 AM  In your present state of health, do you have any  difficulty performing the following activities:  Hearing? 0  Vision? 0  Difficulty concentrating or making decisions? 0  Walking or climbing stairs? 0  Dressing or bathing? 0  Doing errands, shopping? 0  Preparing Food and eating ? N  Using the Toilet? N  In the past six months, have you accidently leaked urine? N  Do you have problems with loss of bowel control? N  Managing your Medications? N  Managing your Finances? N  Housekeeping or managing your Housekeeping? N    Patient Care Team: Michela Pitcher, NP as PCP - General (Pain Medicine) Charlton Haws, [facility] as Pharmacist (Pharmacist)  Indicate any recent Medical Services you may have received from other than [facility] providers in the past year (date may be approximate).     Assessment:   This is a routine wellness examination for [place].  Hearing/Vision screen Hearing Screening - Comments:: Denies hearing difficulties   Vision Screening - Comments:: Wears rx Contacts - up to date with routine eye exams with  Coralie Keens  Dietary issues and exercise activities discussed: Current Exercise Habits: The patient does not participate in regular exercise at present, Exercise limited by: None identified   Goals Addressed               This Visit's Progress     Stay healthy (pt-stated)         Depression Screen    12/01/2021    8:52 AM 11/30/2020   10:04 AM 06/02/2020   11:47 AM 04/28/2020   11:23 AM 03/30/2020   11:58 AM 09/25/2019    2:34 PM 09/13/2018   11:40 AM  PHQ 2/9 Scores  PHQ - 2 Score 0 0 0 1 2 0 0  PHQ- 9 Score   '2 5 7  '$ 0    Fall Risk    12/01/2021    8:55 AM 03/20/2020    1:04 PM 09/25/2019    2:34 PM 08/31/2018    3:09 PM 07/20/2017    9:31 AM  Fall Risk   Falls in the past year? 0 0 0  No  Comment  Emmi Telephone Survey: data to providers prior to load   Number falls in past yr: 0      Comment    Emmi Telephone Survey Actual Response =    Injury with Fall? 0      Risk for fall due to : No  Fall Risks      Follow up Falls prevention discussed        Outlook:  Any stairs in or around the home? Yes  If so, are there any without handrails? No  Home free of loose throw rugs in walkways, pet beds, electrical cords, etc? Yes  Adequate lighting in your home to reduce risk of falls? Yes   ASSISTIVE DEVICES UTILIZED TO PREVENT FALLS:  Life alert? No  Use of a cane, walker or w/c? No  Grab bars in the bathroom? No  Shower chair or bench in shower? Yes  Elevated toilet seat or a handicapped toilet? No   TIMED UP AND GO:  Was the test performed? No . Audio Visit   Cognitive Function:    04/14/2015   12:07 PM  MMSE - Mini Mental State Exam  Orientation to time 5  Orientation to Place 5  Registration 3  Attention/ Calculation 5  Recall 3  Language- name 2 objects 0  Language- repeat 1  Language- follow 3 step command 3  Language- read & follow direction 1  Write a sentence 0  Copy design 0  Total score 26        12/01/2021    8:56 AM  6CIT Screen  What Year? 0 points  What month? 0 points  What time? 0 points  Count back from 20 0 points  Months in reverse 0 points  Repeat phrase 0 points  Total Score 0 points    Immunizations Immunization History  Administered Date(s) Administered   Fluad Quad(high Dose 65+) 11/04/2019   Influenza Split 11/01/2010, 10/31/2011   Influenza Whole 01/31/2005, 11/01/2007, 12/04/2009   Influenza, High Dose Seasonal PF 11/15/2013, 10/24/2017, 10/12/2018, 11/04/2020   Influenza,inj,Quad PF,6+ Mos 10/16/2012, 11/06/2014, 11/11/2015, 11/01/2016, 10/24/2017   Influenza-Unspecified 11/09/2016   PFIZER(Purple Top)SARS-COV-2 Vaccination 02/17/2019, 03/10/2019, 02/11/2020   Pneumococcal Conjugate-13 05/07/2014   Pneumococcal Polysaccharide-23 01/31/2006   Td 01/31/2006   Zoster, Live 08/08/2013    TDAP status: Due, Education has been provided regarding the importance of this vaccine. Advised  may receive this vaccine at local pharmacy or Health Dept. Aware to provide a copy of the vaccination record if obtained from local pharmacy or Health Dept. Verbalized acceptance and understanding.  Flu Vaccine status: Up to date  Pneumococcal vaccine status: Up to date  Covid-19 vaccine status: Completed vaccines  Qualifies for Shingles Vaccine? Yes   Zostavax completed No   Shingrix Completed?: No.    Education has been provided regarding the importance of this vaccine. Patient has been advised to call insurance company to determine out of pocket expense if they have not yet received this vaccine. Advised may also receive vaccine at local pharmacy or Health Dept. Verbalized acceptance and understanding.  Screening Tests Health Maintenance  Topic Date Due   COVID-19 Vaccine (4 - Pfizer series) 12/17/2021 (Originally 04/07/2020)   Zoster Vaccines- Shingrix (1 of 2) 02/26/2022 (Originally 01/30/1986)   INFLUENZA VACCINE  05/01/2022 (Originally 08/31/2021)   TETANUS/TDAP  12/02/2022 (Originally 02/01/2016)   Medicare Annual Wellness (AWV)  12/02/2022   Pneumonia Vaccine 52+ Years old  Completed   DEXA SCAN  Completed  HPV VACCINES  Aged Out    Health Maintenance  There are no preventive care reminders to display for this patient.   Colorectal cancer screening: No longer required.   Mammogram status: No longer required due to Age.  Bone Density status: Completed 05/12/21. Results reflect: Bone density results: OSTEOPOROSIS. Repeat every   years.  Lung Cancer Screening: (Low Dose CT Chest recommended if Age 35-80 years, 30 pack-year currently smoking OR have quit w/in 15years.) does not qualify.     Additional Screening:  Hepatitis C Screening: does not qualify; Completed   Vision Screening: Recommended annual ophthalmology exams for early detection of glaucoma and other disorders of the eye. Is the patient up to date with their annual eye exam?  Yes  Who is the provider or what  is the name of the office in which the patient attends annual eye exams? [facility] If pt is not established with a provider, would they like to be referred to a provider to establish care? No .   Dental Screening: Recommended annual dental exams for proper oral hygiene  Community Resource Referral / Chronic Care Management:  CRR required this visit?  No   CCM required this visit?  No      Plan:     I have personally reviewed and noted the following in the patient's chart:   Medical and social history Use of alcohol, tobacco or illicit drugs  Current medications and supplements including opioid prescriptions. Patient is not currently taking opioid prescriptions. Functional ability and status Nutritional status Physical activity Advanced directives List of other physicians Hospitalizations, surgeries, and ER visits in previous 12 months Vitals Screenings to include cognitive, depression, and falls Referrals and appointments  In addition, I have reviewed and discussed with patient certain preventive protocols, quality metrics, and best practice recommendations. A written personalized care plan for preventive services as well as general preventive health recommendations were provided to patient.     Criselda Peaches, LPN   [id number]   Nurse Notes: None

## 2021-12-02 ENCOUNTER — Ambulatory Visit (INDEPENDENT_AMBULATORY_CARE_PROVIDER_SITE_OTHER): Payer: PPO

## 2021-12-02 DIAGNOSIS — M810 Age-related osteoporosis without current pathological fracture: Secondary | ICD-10-CM

## 2021-12-02 MED ORDER — DENOSUMAB 60 MG/ML ~~LOC~~ SOSY
60.0000 mg | PREFILLED_SYRINGE | Freq: Once | SUBCUTANEOUS | Status: AC
  Administered 2021-12-02: 60 mg via SUBCUTANEOUS

## 2021-12-02 NOTE — Progress Notes (Signed)
Patient presented for 6-month Prolia injection SQ to left arm given by Zanasia Hickson, CMA. Patient tolerated injection well. 

## 2021-12-05 IMAGING — DX DG SHOULDER 2+V*L*
3 series · 3 of 3 positions shown · non-contrast
Comparison: None.

CLINICAL DATA: Left shoulder pain

EXAM:
LEFT SHOULDER - 2+ VIEW

[shoulder y view]
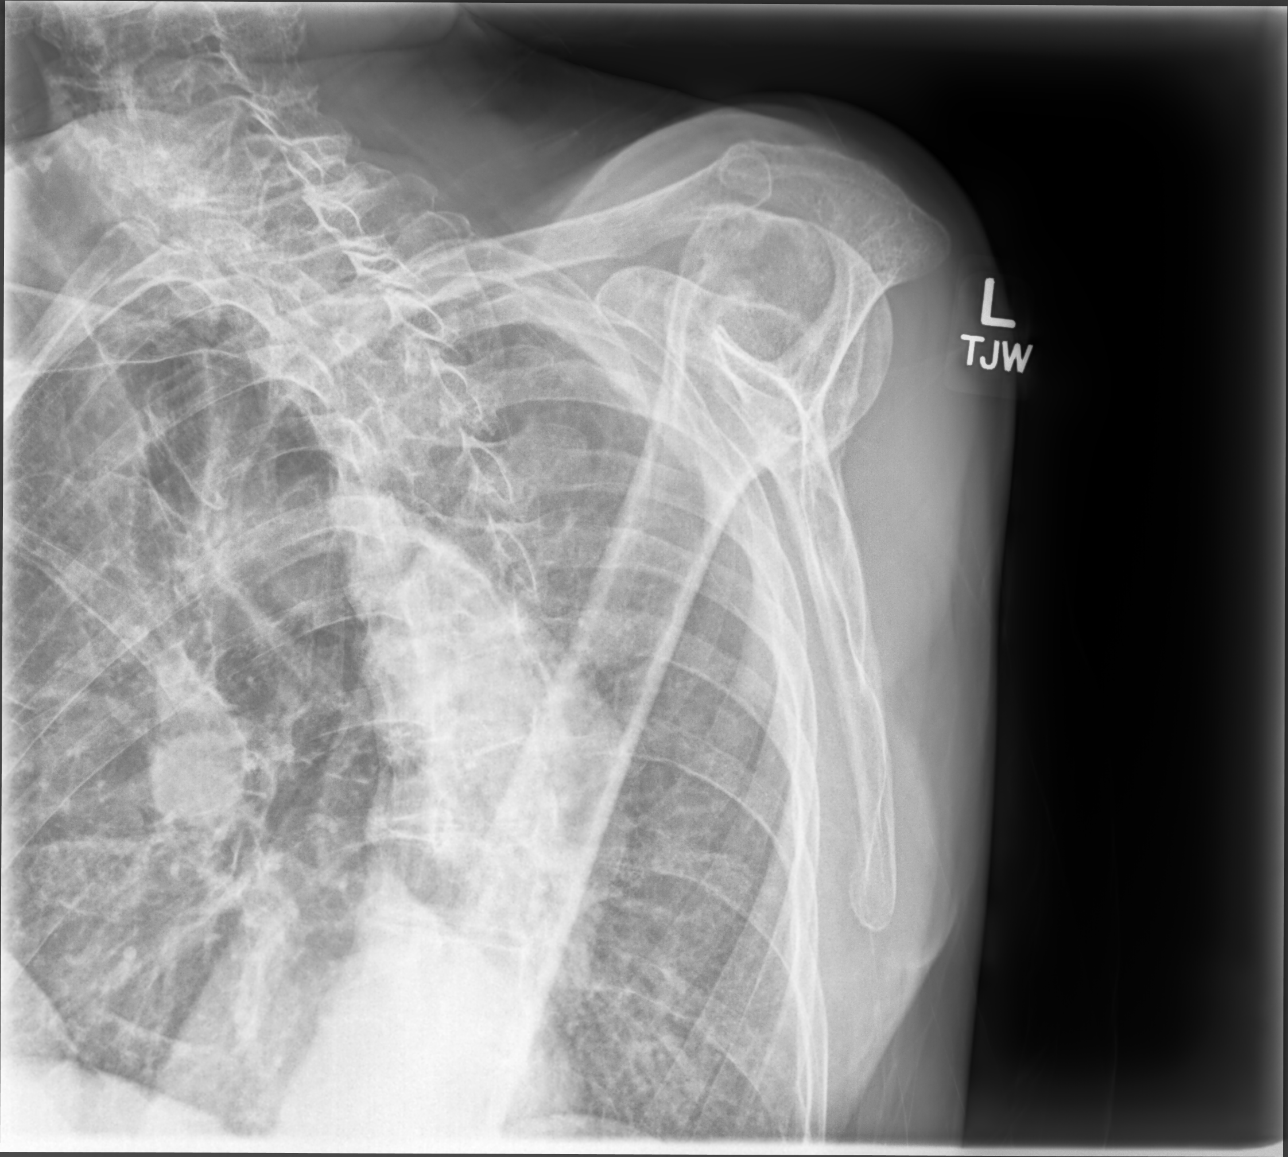

[shoulder (grashey) ap]
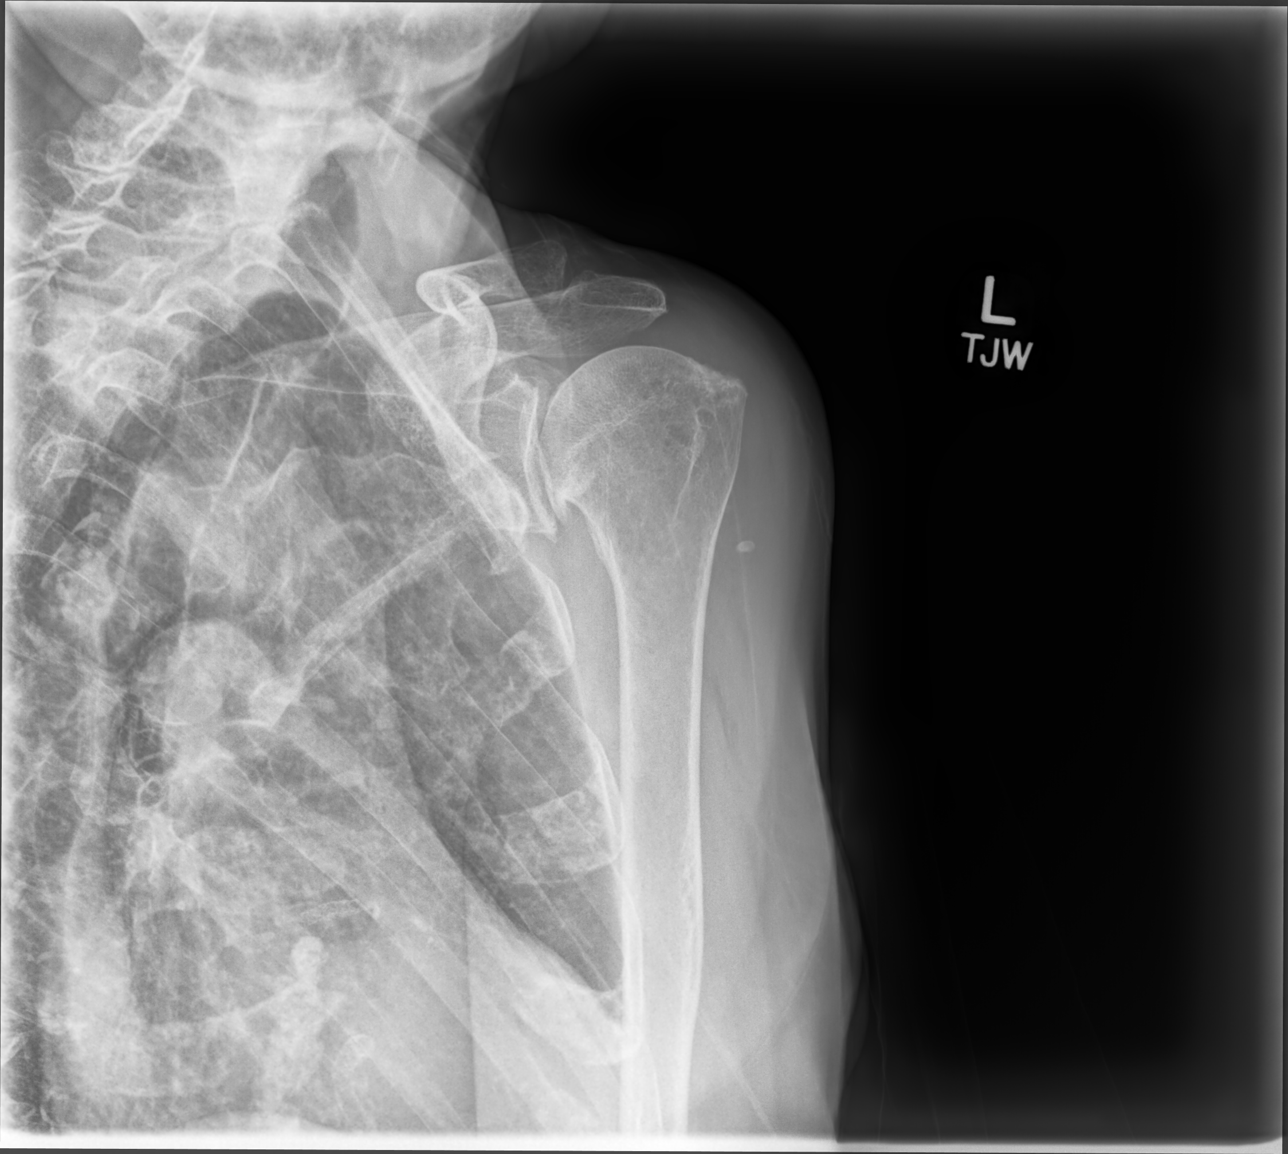

[shoulder axial]
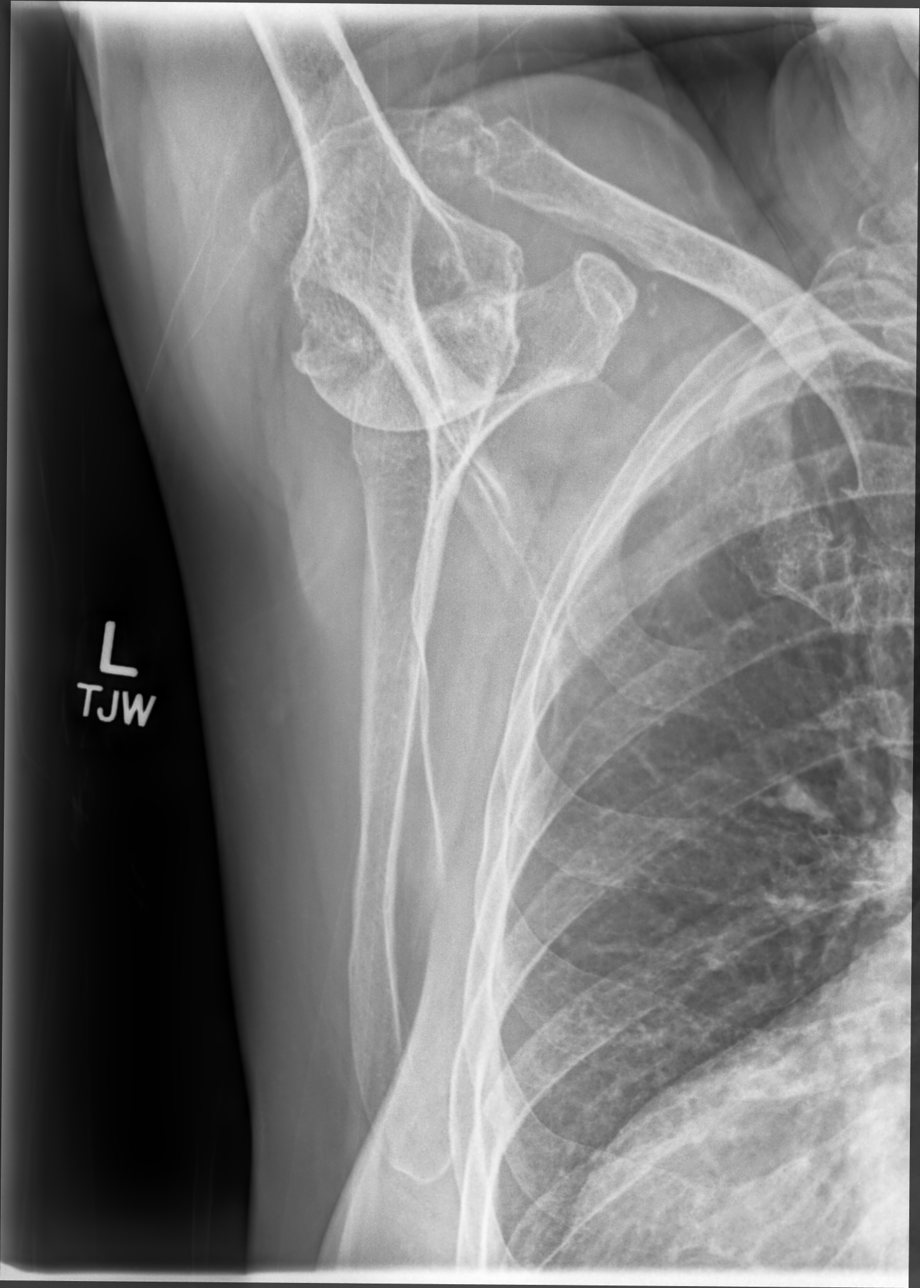

[3 of 3 positions shown; findings below may reference images not displayed]

FINDINGS: Normal alignment. No acute fracture or dislocation.
Acromioclavicular joint space is preserved. Moderate glenohumeral
degenerative arthritis.
IMPRESSION: Moderate glenohumeral degenerative arthritis.

## 2021-12-06 ENCOUNTER — Ambulatory Visit: Payer: PPO | Admitting: Nurse Practitioner

## 2022-01-06 ENCOUNTER — Telehealth: Payer: Self-pay | Admitting: Nurse Practitioner

## 2022-01-06 ENCOUNTER — Encounter: Payer: Self-pay | Admitting: Nurse Practitioner

## 2022-01-06 ENCOUNTER — Ambulatory Visit (INDEPENDENT_AMBULATORY_CARE_PROVIDER_SITE_OTHER): Payer: PPO | Admitting: Nurse Practitioner

## 2022-01-06 VITALS — BP 128/64 | HR 84 | Temp 98.5°F | Ht 63.5 in | Wt 152.2 lb

## 2022-01-06 DIAGNOSIS — H938X3 Other specified disorders of ear, bilateral: Secondary | ICD-10-CM

## 2022-01-06 DIAGNOSIS — J029 Acute pharyngitis, unspecified: Secondary | ICD-10-CM

## 2022-01-06 DIAGNOSIS — U071 COVID-19: Secondary | ICD-10-CM | POA: Diagnosis not present

## 2022-01-06 DIAGNOSIS — R509 Fever, unspecified: Secondary | ICD-10-CM

## 2022-01-06 LAB — POCT INFLUENZA A/B
Influenza A, POC: NEGATIVE
Influenza B, POC: NEGATIVE

## 2022-01-06 LAB — POCT RAPID STREP A (OFFICE): Rapid Strep A Screen: NEGATIVE

## 2022-01-06 LAB — POC COVID19 BINAXNOW: SARS Coronavirus 2 Ag: POSITIVE — AB

## 2022-01-06 MED ORDER — NIRMATRELVIR/RITONAVIR (PAXLOVID) TABLET (RENAL DOSING): 2.0000 | ORAL_TABLET | Freq: Two times a day (BID) | ORAL | 0 refills | Status: AC

## 2022-01-06 NOTE — Telephone Encounter (Signed)
Patient called in and stated she has a fever of 101.6. She is experiencing some dizziness, congestion, headache, and earache. Sent over to access nurse.

## 2022-01-06 NOTE — Assessment & Plan Note (Signed)
Strep and flu test negative in office.  COVID test positive.  Can use over-the-counter analgesics as needed for discomfort along with rest and plenty of hydration.

## 2022-01-06 NOTE — Assessment & Plan Note (Signed)
Tested positive for COVID-19.  Will send in renal dose of Paxlovid.  Did discuss this is EUA only.  Did discuss common side effects of nausea and altered taste.  Did discuss CDC recommendations/guidelines in regards to self-isolation/quarantine.  Did discuss signs and symptoms when to be seen urgent or emergently.  Follow-up if no improvement

## 2022-01-06 NOTE — Telephone Encounter (Signed)
Per appt notes pt already has appt with Romilda Garret NP 01/06/22 at 4:20. Sending note to Romilda Garret NP and [place] pool.

## 2022-01-06 NOTE — Telephone Encounter (Signed)
Access nurse recommended pt be seen within 4 hours. Able to schedule with PCP today. Spoke with pt and advised to wear mask into the office. Encouraged her to checkin through MyChart to speed up process. Pt voiced understanding

## 2022-01-06 NOTE — Progress Notes (Signed)
Acute Office Visit  Subjective:     Patient ID: Amy Lin, female    DOB: 1933-07-03, 88 y.o.   MRN: [id number]  Chief Complaint  Patient presents with   Fever    C/o fever- max 101.6, nasal congestion, scratch throat and feels fluid sound in bilateral ears.  Sxs started 01/01/22.     HPI Patient is in today for Sick sympotms  Started on Saturday with a runny nose States that last night she really starred getting sick. States she had a fever of 101.3 last night. She took some tylenol.  No sick contacts Covid vaccines: Pfizer x2 with 1 booster No covid test. No flu vaccine this season  Has been using tylenol that has helped some   Review of Systems  Constitutional:  Positive for chills, fever and malaise/fatigue.       Appetite is decreased Fluid intake is ok. Not as much as she should  HENT:  Positive for congestion, ear pain (fullness or whoosing), sinus pain and sore throat.   Respiratory:  Positive for cough. Negative for sputum production.   Musculoskeletal:  Positive for myalgias. Negative for joint pain.  Neurological:  Positive for headaches.        Objective:    BP 128/64   Pulse 84   Temp 98.5 F (36.9 C) (Temporal)   Ht 5' 3.5" (1.613 m)   Wt 152 lb 3.2 oz (69 kg)   SpO2 93%   BMI 26.54 kg/m    Physical Exam Vitals and nursing note reviewed.  Constitutional:      Appearance: Normal appearance.  HENT:     Right Ear: Tympanic membrane, ear canal and external ear normal.     Left Ear: Tympanic membrane, ear canal and external ear normal.     Nose:     Right Sinus: Frontal sinus tenderness present. No maxillary sinus tenderness.     Left Sinus: Frontal sinus tenderness present. No maxillary sinus tenderness.     Mouth/Throat:     Mouth: Mucous membranes are moist.     Pharynx: Oropharynx is clear.     Comments: PND "+" Cardiovascular:     Rate and Rhythm: Normal rate and regular rhythm.     Heart sounds: Normal heart sounds.  Pulmonary:      Effort: Pulmonary effort is normal.     Breath sounds: Normal breath sounds.  Lymphadenopathy:     Cervical: No cervical adenopathy.  Neurological:     Mental Status: She is alert.     Results for orders placed or performed in visit on 01/06/22  POC COVID-19  Result Value Ref Range   SARS Coronavirus 2 Ag Positive (A) Negative  Influenza A/B  Result Value Ref Range   Influenza A, POC Negative Negative   Influenza B, POC Negative Negative  POCT rapid strep A  Result Value Ref Range   Rapid Strep A Screen Negative Negative        Assessment & Plan:   Problem List Items Addressed This Visit       Nervous and Auditory   Sensation of fullness in both ears    Continue second-generation antihistamine.        Other   Fever - Primary    Continue over-the-counter analgesics as needed and directed.  Push fluids and rest      Relevant Orders   POC COVID-19 (Completed)   Influenza A/B (Completed)   COVID-19    Tested positive for COVID-19.  Will send in renal dose of Paxlovid.  Did discuss this is EUA only.  Did discuss common side effects of nausea and altered taste.  Did discuss CDC recommendations/guidelines in regards to self-isolation/quarantine.  Did discuss signs and symptoms when to be seen urgent or emergently.  Follow-up if no improvement      Relevant Medications   nirmatrelvir/ritonavir EUA, renal dosing, (PAXLOVID) 10 x 150 MG & 10 x '100MG'$  TABS   Sore throat    Strep and flu test negative in office.  COVID test positive.  Can use over-the-counter analgesics as needed for discomfort along with rest and plenty of hydration.      Relevant Orders   POC COVID-19 (Completed)   Influenza A/B (Completed)   POCT rapid strep A (Completed)    Meds ordered this encounter  Medications   nirmatrelvir/ritonavir EUA, renal dosing, (PAXLOVID) 10 x 150 MG & 10 x '100MG'$  TABS    Sig: Take 2 tablets by mouth 2 (two) times daily for 5 days. (Take nirmatrelvir 150 mg one  tablet twice daily for 5 days and ritonavir 100 mg one tablet twice daily for 5 days) Patient GFR is 54    Dispense:  20 tablet    Refill:  0    Order Specific Question:   Supervising Provider    Answer:   Loura Pardon A [[id number]]    Return if symptoms worsen or fail to improve.  Romilda Garret, NP

## 2022-01-06 NOTE — Patient Instructions (Signed)
You tested positive for Covid 19 You need to quarantine through 01/10/2022. If you are starting to feel better and been fever free for 24 hours without the use of fever reducing medication you can go out on 01/11/2022 but need to wear a mask while you are out through 01/16/2022  DO NOT TAKE the rosuvastatin for 2 weeks DO NOT TAKE the Flonase while taking the paxlovid

## 2022-01-06 NOTE — Telephone Encounter (Signed)
Noted. Will evaluate in office  

## 2022-01-06 NOTE — Assessment & Plan Note (Signed)
Continue over-the-counter analgesics as needed and directed.  Push fluids and rest

## 2022-01-06 NOTE — Assessment & Plan Note (Signed)
Continue second-generation antihistamine.

## 2022-01-06 NOTE — Telephone Encounter (Signed)
[facility] - Client TELEPHONE ADVICE RECORD [facility] Patient Name: Amy Lin N Gender: Female DOB: January 12, 1934 Age: 88 Y 11 M 7 D Return Phone Number: [phone] (Primary), [phone] (Secondary) Address[place]  [address] Client [facility] - Client Client Site [facility] - Day Provider Romilda Garret- NP Contact Type Call Who Is Calling Patient / Member / Family / Caregiver Call Type Triage / Clinical Relationship To Patient Self Return Phone Number [phone] (Primary) Chief Complaint Dizziness Reason for Call Symptomatic / Request for [place] states she has a fever of 101.6. She is dizzy, congested, and has an earache. Translation No Nurse Assessment Nurse: Lenon Curt, RN, Melanie Date/Time (Eastern Time): 01/06/2022 12:53:57 PM Confirm and document reason for call. If symptomatic, describe symptoms. ---Caller states has fever of 101.55F forehead scanner since last night, dizzy, nasal congestion for a few days, and ear pain. Took Tylenol last night. Sore throat. HTN, Cholesterol, Thyroid, heart issues, blood thinners, 2 cardiac stents Does the patient have any new or worsening symptoms? ---Yes Will a triage be completed? ---Yes Related visit to physician within the last 2 weeks? ---No Does the PT have any chronic conditions? (i.e. diabetes, asthma, this includes High risk factors for pregnancy, etc.) ---Yes Is this a behavioral health or substance abuse call? ---No Guidelines Guideline Title Affirmed Question Affirmed Notes Nurse Date/Time (Eastern Time) Cough - Acute NonProductive [1] Fever > 101 F (38.3 C) AND [2] age > 75 years Lenon Curt, RN, [name] 01/06/2022 12:58:07 PM Disp. Time [name] Time) Disposition Final User 01/06/2022 1:01:37 PM See HCP within 4 Hours (or PCP triage) Yes Lenon Curt, RN, Melanie PLEASE NOTE: All  timestamps contained within this report are represented as [place] Standard Time. CONFIDENTIALTY NOTICE: This fax transmission is intended only for the addressee. It contains information that is legally privileged, confidential or otherwise protected from use or disclosure. If you are not the intended recipient, you are strictly prohibited from reviewing, disclosing, copying using or disseminating any of this information or taking any action in reliance on or regarding this information. If you have received this fax in error, please notify us immediately by telephone so that we can arrange for its return to [place]. Phone: [phone], Toll-Free: [phone], Fax: [phone] Page: 2 of 2 Call Id: [id number] Final Disposition 01/06/2022 1:01:37 PM See HCP within 4 Hours (or PCP triage) Yes Lenon Curt, RN, Donnajean Lopes Disagree/Comply Comply Caller Understands Yes PreDisposition Call Doctor Care Advice Given Per Guideline SEE HCP (OR PCP TRIAGE) WITHIN 4 HOURS: * IF OFFICE WILL BE OPEN: You need to be seen within the next 3 or 4 hours. Call your doctor (or NP/PA) now or as soon as the office opens. * UCC: Some UCCs can manage patients who are stable and have less serious symptoms (e.g., minor illnesses and injuries). The triager must know the [facility] capabilities before sending a patient there. If unsure, call ahead. * OFFICE: If patient sounds stable and not seriously ill, consult PCP (or follow your office policy) to see if patient can be seen NOW in office. * For fevers above 101 F (38.3 C) take either acetaminophen or ibuprofen. FEVER MEDICINES: CALL BACK IF: * You become worse CARE ADVICE given per Cough - Acute Non-Productive (Adult) guideline. Comments User: Erik Obey, RN Date/Time [name] Time): 01/06/2022 1:04:18 PM Backline: Mickel Baas, connected to discuss availability. Referrals Warm transfer to backlin

## 2022-01-07 ENCOUNTER — Encounter: Payer: Self-pay | Admitting: Nurse Practitioner

## 2022-01-07 DIAGNOSIS — R051 Acute cough: Secondary | ICD-10-CM

## 2022-01-07 MED ORDER — BENZONATATE 100 MG PO CAPS: 100.0000 mg | ORAL_CAPSULE | Freq: Three times a day (TID) | ORAL | 0 refills | Status: DC | PRN

## 2022-01-27 ENCOUNTER — Encounter: Payer: Self-pay | Admitting: Nurse Practitioner

## 2022-01-27 NOTE — Telephone Encounter (Signed)
Can we get her scheduled please

## 2022-01-28 NOTE — Telephone Encounter (Signed)
Spoke to pt, she didn't want to wait until [facility] 1st available slot on 02/04/22. Pt scheduled to see [facility] on 02/01/22 instead.

## 2022-02-01 ENCOUNTER — Ambulatory Visit (INDEPENDENT_AMBULATORY_CARE_PROVIDER_SITE_OTHER): Payer: PPO | Admitting: Family Medicine

## 2022-02-01 ENCOUNTER — Encounter: Payer: Self-pay | Admitting: Family Medicine

## 2022-02-01 VITALS — BP 132/82 | HR 61 | Ht 63.5 in | Wt 153.0 lb

## 2022-02-01 DIAGNOSIS — R0982 Postnasal drip: Secondary | ICD-10-CM | POA: Diagnosis not present

## 2022-02-01 DIAGNOSIS — J0100 Acute maxillary sinusitis, unspecified: Secondary | ICD-10-CM

## 2022-02-01 MED ORDER — FLUTICASONE PROPIONATE 50 MCG/ACT NA SUSP: 1.0000 | Freq: Every day | NASAL | 2 refills | Status: DC

## 2022-02-01 MED ORDER — AMOXICILLIN 500 MG PO CAPS: 1000.0000 mg | ORAL_CAPSULE | Freq: Two times a day (BID) | ORAL | 0 refills | Status: DC

## 2022-02-01 MED ORDER — PREDNISONE 10 MG PO TABS: ORAL_TABLET | ORAL | 0 refills | Status: DC

## 2022-02-01 NOTE — Progress Notes (Signed)
Patient ID: Amy Lin, female    DOB: 07-31-1933, 89 y.o.   MRN: [id number]  This visit was conducted in person.  BP 132/82   Pulse 61   Ht 5' 3.5" (1.613 m)   Wt 153 lb (69.4 kg)   SpO2 98%   BMI 26.68 kg/m    CC:  Chief Complaint  Patient presents with   Nasal Congestion    Since COVID early December   Ear Pain    Right, denies drainage   Lymphadenopathy    Under R jawline, has improved some    Subjective:   HPI: Amy Lin is a 89 y.o. female presenting on 02/01/2022 for Nasal Congestion (Since COVID early December), Ear Pain (Right, denies drainage), and Lymphadenopathy (Under R jawline, has improved some)  Diagnosed with COVID-19 in early December Reviewed office visit note from [facility] cable 01/06/2022   Treated with course of Paxlovid.   She feels better overall.. no further cough, npo SOB. No fever.  Rare hacking cough.  She reports that since that time she continues to have right ear pain, nasal congestion and right lymphadenopathy ( gland now not swollen)   Using Xyzal nightly. She has been using flonase until last week.  Has noted scabs and sores in nose, occ bloody nose.  Yellow and green mucus.  Some right sided face pain.   Relevant past medical, surgical, family and social history reviewed and updated as indicated. Interim medical history since our last visit reviewed. Allergies and medications reviewed and updated. Outpatient Medications Prior to Visit  Medication Sig Dispense Refill   aspirin 81 MG EC tablet Take 81 mg by mouth daily.     Cholecalciferol (VITAMIN D3) 2000 UNITS TABS Take 1 tablet by mouth every evening.     Cyanocobalamin (VITAMIN B 12 PO) Take 1 tablet by mouth every evening.     ezetimibe (ZETIA) 10 MG tablet TAKE 1 TABLET BY MOUTH ONCE A DAY AFTER SUPPER 90 tablet 2   furosemide (LASIX) 20 MG tablet Take 1 tablet (20 mg total) by mouth daily as needed for fluid or edema. 90 tablet 3   hydrocortisone 2.5 % lotion Apply  topically 2 (two) times daily. 59 mL 0   levocetirizine (XYZAL) 5 MG tablet Take 5 mg by mouth every evening.     levothyroxine (SYNTHROID) 75 MCG tablet TAKE 1 TABLET BY MOUTH ONCE A DAY ON AN EMPTY STOMACH.TAKE WITH A GLASS OF WATERAT LEAST 30-60 MIN BEFORE BREAKFAST 90 tablet 1   lisinopril (ZESTRIL) 5 MG tablet TAKE 1 TABLET BY MOUTH ONCE A DAY 90 tablet 3   nitroGLYCERIN (NITROSTAT) 0.4 MG SL tablet DISSOLVE 1 TABLET UNDER TONGUE AS NEEDEDFOR CHEST PAIN. MAY REPEAT 5 MINUTES APART 3 TIMES IF NEEDED 25 tablet 3   pantoprazole (PROTONIX) 20 MG tablet TAKE 1 TABLET BY MOUTH TWICE A DAY 180 tablet 2   potassium chloride SA (KLOR-CON M) 10 MEQ tablet Take 1 tablet (10 mEq total) by mouth daily. 90 tablet 3   rosuvastatin (CRESTOR) 20 MG tablet TAKE 1 TABLET BY MOUTH ONCE A DAY 90 tablet 3   fluticasone (FLONASE) 50 MCG/ACT nasal spray Place 1 spray into both nostrils daily. (Patient not taking: Reported on 02/01/2022) 16 g 0   benzonatate (TESSALON) 100 MG capsule Take 1 capsule (100 mg total) by mouth 3 (three) times daily as needed for cough. 21 capsule 0   No facility-administered medications prior to visit.     Per  HPI unless specifically indicated in ROS section below Review of Systems  Constitutional:  Negative for fatigue and fever.  HENT:  Positive for congestion, sinus pressure and sinus pain.   Eyes:  Negative for pain.  Respiratory:  Positive for cough. Negative for shortness of breath.   Cardiovascular:  Negative for chest pain, palpitations and leg swelling.  Gastrointestinal:  Negative for abdominal pain.  Genitourinary:  Negative for dysuria and vaginal bleeding.  Musculoskeletal:  Negative for back pain.  Neurological:  Negative for syncope, light-headedness and headaches.  Psychiatric/Behavioral:  Negative for dysphoric mood.    Objective:  BP 132/82   Pulse 61   Ht 5' 3.5" (1.613 m)   Wt 153 lb (69.4 kg)   SpO2 98%   BMI 26.68 kg/m   Wt Readings from Last 3  Encounters:  02/01/22 153 lb (69.4 kg)  01/06/22 152 lb 3.2 oz (69 kg)  12/01/21 152 lb (68.9 kg)      Physical Exam Constitutional:      General: She is not in acute distress.    Appearance: She is well-developed. She is not ill-appearing or toxic-appearing.  HENT:     Head: Normocephalic.     Right Ear: Hearing, tympanic membrane, ear canal and external ear normal. Tympanic membrane is not erythematous, retracted or bulging.     Left Ear: Hearing, tympanic membrane, ear canal and external ear normal. Tympanic membrane is not erythematous, retracted or bulging.     Nose: Mucosal edema, congestion and rhinorrhea present.     Right Sinus: Maxillary sinus tenderness present. No frontal sinus tenderness.     Left Sinus: No maxillary sinus tenderness or frontal sinus tenderness.     Mouth/Throat:     Mouth: Mucous membranes are moist.     Pharynx: Uvula midline.  Eyes:     General: Lids are normal. Lids are everted, no foreign bodies appreciated.     Conjunctiva/sclera: Conjunctivae normal.     Pupils: Pupils are equal, round, and reactive to light.  Neck:     Thyroid: No thyroid mass or thyromegaly.     Vascular: No carotid bruit.     Trachea: Trachea normal.  Cardiovascular:     Rate and Rhythm: Normal rate and regular rhythm.     Pulses: Normal pulses.     Heart sounds: Normal heart sounds, S1 normal and S2 normal. No murmur heard.    No friction rub. No gallop.  Pulmonary:     Effort: Pulmonary effort is normal. No tachypnea or respiratory distress.     Breath sounds: Normal breath sounds. No decreased breath sounds, wheezing, rhonchi or rales.  Musculoskeletal:     Cervical back: Normal range of motion and neck supple.  Skin:    General: Skin is warm and dry.     Findings: No rash.  Neurological:     Mental Status: She is alert.  Psychiatric:        Mood and Affect: Mood is not anxious or depressed.        Speech: Speech normal.        Behavior: Behavior normal.  Behavior is cooperative.        Judgment: Judgment normal.       Results for orders placed or performed in visit on 01/06/22  POC COVID-19  Result Value Ref Range   SARS Coronavirus 2 Ag Positive (A) Negative  Influenza A/B  Result Value Ref Range   Influenza A, POC Negative Negative   Influenza B,  POC Negative Negative  POCT rapid strep A  Result Value Ref Range   Rapid Strep A Screen Negative Negative     COVID 19 screen:  No recent travel or known exposure to COVID19 The patient denies respiratory symptoms of COVID 19 at this time. The importance of social distancing was discussed today.   Assessment and Plan    Problem List Items Addressed This Visit     Acute non-recurrent maxillary sinusitis - Primary     Given persistent symptoms > 7-10 days of illness .Marland Kitchen Concerning for bacterial infection.  Will treat with amoxicillin 500 mg 2 tabs po twice daily.  Start and complete steroid taper to help ear pain and pressure.  Once prednisone taper completed.. restart flonase 2 sprays per nostril daily.  If severe shortness of breath.Marland Kitchen go the ER.      Relevant Medications   fluticasone (FLONASE) 50 MCG/ACT nasal spray   predniSONE (DELTASONE) 10 MG tablet   amoxicillin (AMOXIL) 500 MG capsule   PND (post-nasal drip)   Relevant Medications   fluticasone (FLONASE) 50 MCG/ACT nasal spray     Eliezer Lofts, MD

## 2022-02-01 NOTE — Patient Instructions (Addendum)
Given persistent symptoms > 7-10 days of illness .Marland Kitchen Concerning for bacterial infection.  Will treat with amoxicillin 500 mg 2 tabs po twice daily.  Start and complete steroid taper to help ear pain and pressure.  Once prednisone taper completed.. restart flonase 2 sprays per nostril daily.  If severe shortness of breath.Marland Kitchen go the ER.

## 2022-02-01 NOTE — Assessment & Plan Note (Signed)
Given persistent symptoms > 7-10 days of illness .Marland Kitchen Concerning for bacterial infection.  Will treat with amoxicillin 500 mg 2 tabs po twice daily.  Start and complete steroid taper to help ear pain and pressure.  Once prednisone taper completed.. restart flonase 2 sprays per nostril daily.  If severe shortness of breath.Marland Kitchen go the ER.

## 2022-02-08 ENCOUNTER — Other Ambulatory Visit: Payer: Self-pay | Admitting: Nurse Practitioner

## 2022-02-08 DIAGNOSIS — E039 Hypothyroidism, unspecified: Secondary | ICD-10-CM

## 2022-02-10 ENCOUNTER — Emergency Department
Admission: EM | Admit: 2022-02-10 | Discharge: 2022-02-10 | Disposition: A | Discharge status: Home / Self Care | Payer: PPO | Attending: Emergency Medicine | Admitting: Emergency Medicine

## 2022-02-10 ENCOUNTER — Emergency Department: Payer: PPO

## 2022-02-10 ENCOUNTER — Other Ambulatory Visit: Payer: Self-pay

## 2022-02-10 DIAGNOSIS — I10 Essential (primary) hypertension: Secondary | ICD-10-CM | POA: Diagnosis not present

## 2022-02-10 DIAGNOSIS — R112 Nausea with vomiting, unspecified: Secondary | ICD-10-CM | POA: Insufficient documentation

## 2022-02-10 DIAGNOSIS — Z8616 Personal history of COVID-19: Secondary | ICD-10-CM | POA: Diagnosis not present

## 2022-02-10 DIAGNOSIS — Z20822 Contact with and (suspected) exposure to covid-19: Secondary | ICD-10-CM | POA: Diagnosis not present

## 2022-02-10 DIAGNOSIS — R197 Diarrhea, unspecified: Secondary | ICD-10-CM | POA: Insufficient documentation

## 2022-02-10 DIAGNOSIS — K529 Noninfective gastroenteritis and colitis, unspecified: Secondary | ICD-10-CM

## 2022-02-10 LAB — CBC
HCT: 41.8 % (ref 36.0–46.0)
Hemoglobin: 14 g/dL (ref 12.0–15.0)
MCH: 31.2 pg (ref 26.0–34.0)
MCHC: 33.5 g/dL (ref 30.0–36.0)
MCV: 93.1 fL (ref 80.0–100.0)
Platelets: 162 10*3/uL (ref 150–400)
RBC: 4.49 MIL/uL (ref 3.87–5.11)
RDW: 13.3 % (ref 11.5–15.5)
WBC: 4.6 10*3/uL (ref 4.0–10.5)
nRBC: 0 % (ref 0.0–0.2)

## 2022-02-10 LAB — BASIC METABOLIC PANEL
Anion gap: 11 (ref 5–15)
BUN: 19 mg/dL (ref 8–23)
CO2: 24 mmol/L (ref 22–32)
Calcium: 8.9 mg/dL (ref 8.9–10.3)
Chloride: 103 mmol/L (ref 98–111)
Creatinine, Ser: 0.88 mg/dL (ref 0.44–1.00)
GFR, Estimated: >60 mL/min (ref >60)
Glucose, Bld: 115 mg/dL — ABNORMAL HIGH (ref 70–99)
Potassium: 3.7 mmol/L (ref 3.5–5.1)
Sodium: 138 mmol/L (ref 135–145)

## 2022-02-10 LAB — URINALYSIS, COMPLETE (UACMP) WITH MICROSCOPIC
Bilirubin Urine: NEGATIVE
Glucose, UA: NEGATIVE mg/dL
Hgb urine dipstick: NEGATIVE
Ketones, ur: NEGATIVE mg/dL
Nitrite: NEGATIVE
Protein, ur: 30 mg/dL — AB
Specific Gravity, Urine: 1.016 (ref 1.005–1.030)
pH: 7 (ref 5.0–8.0)

## 2022-02-10 LAB — RESP PANEL BY RT-PCR (RSV, FLU A&B, COVID)  RVPGX2
Influenza A by PCR: NEGATIVE
Influenza B by PCR: NEGATIVE
Resp Syncytial Virus by PCR: NEGATIVE
SARS Coronavirus 2 by RT PCR: NEGATIVE

## 2022-02-10 MED ORDER — IOHEXOL 300 MG/ML  SOLN
100.0000 mL | Freq: Once | INTRAMUSCULAR | Status: AC | PRN
  Administered 2022-02-10: 100 mL via INTRAVENOUS

## 2022-02-10 MED ORDER — SODIUM CHLORIDE 0.9 % IV BOLUS
1000.0000 mL | Freq: Once | INTRAVENOUS | Status: AC
  Administered 2022-02-10: 1000 mL via INTRAVENOUS

## 2022-02-10 MED ORDER — ONDANSETRON HCL 4 MG/2ML IJ SOLN
4.0000 mg | Freq: Once | INTRAMUSCULAR | Status: AC
  Administered 2022-02-10: 4 mg via INTRAVENOUS
  Filled 2022-02-10: qty 2

## 2022-02-10 MED ORDER — LOPERAMIDE HCL 2 MG PO CAPS
2.0000 mg | ORAL_CAPSULE | Freq: Once | ORAL | Status: AC
  Administered 2022-02-10: 2 mg via ORAL
  Filled 2022-02-10: qty 1

## 2022-02-10 MED ORDER — ONDANSETRON 4 MG PO TBDP: 4.0000 mg | ORAL_TABLET | Freq: Three times a day (TID) | ORAL | 0 refills | Status: DC | PRN

## 2022-02-10 NOTE — ED Triage Notes (Addendum)
Pt presents to ED with c/o of flu like s/s. Pt states PCP sent pt here due to possible dehydration. Pt states vomiting and diarrhea since Saturday. NAD noted.  Pt states COVID at the beginning of December.

## 2022-02-10 NOTE — Telephone Encounter (Signed)
Please triage, thank you

## 2022-02-10 NOTE — Telephone Encounter (Signed)
I spoke with pt; pt said she was seen on 02/01/22 and was feeling better; night before last about 2 AM pt started with vomiting large amts that lasted all night and then watery diarrhea started. Pt had eaten a hamburger and potato salad the evening before got sick. Pt  said last time she vomited was 02/09/22 at 6 AM. Pt continues with watery diarrhea. Pt just had diarrhea and voided small amt of urine. Since yesterday pt has not eaten except few crackers and sipping on small amts of gatorade and trying to eat ice chips. Pt has continuous upper and lower mid dull abd pain that is now at pain level of 6. Pts temp now is 99.6 but has been taking Tylenol. Pt mouth is very dry and pt is extremely weak. Pt said no way she could drive and family does not live here. Pt has not seen any blood and asked that I call EMS to take pt to ED for eval and if possibly dehydrated. I called 911 and EMS was dispatched; pt very appreciative. Sending note to Romilda Garret NP and [place] pool.

## 2022-02-10 NOTE — Telephone Encounter (Signed)
Noted. I see that she was seen in the ED and discharged

## 2022-02-10 NOTE — ED Notes (Signed)
First Nurse: Pt here via [facility] with N/V x2 days. Pt states she had a fever yesterday and took Tylenol, pt was advised to come to ED per her primary. Pt has not been eating well lately.   182/82 76 16 98% RA 124-cbg

## 2022-02-10 NOTE — ED Provider Notes (Signed)
[facility] Provider Note    Event Date/Time   First MD Initiated Contact with Patient 02/10/22 1005     (approximate)  History   Chief Complaint: Emesis  HPI  Amy Lin is a 89 y.o. female with a past medical history of gastric reflux hypertension, hyperlipidemia, presents to the emergency department for nausea vomiting and diarrhea.  According to the patient 2 days ago she states she had nausea and vomiting all throughout the night.  Vomiting stopped yesterday morning however the patient states she has since had some nausea and diarrhea.  Patient was concerned she could be getting dehydrated so she came to the emergency department.  Patient states over the past 1 week she has had increased congestion.  States she was diagnosed with COVID at the beginning of December but largely resolved until 1 week ago when she became congested once again.  Her doctor put her on amoxicillin and prednisone.  Patient has finished the prednisone and had 1 day left of amoxicillin.  Patient states she was having abdominal pain yesterday with temperature of 99.7 but no pain today and the patient is afebrile in the emergency department.  Physical Exam   Triage Vital Signs: ED Triage Vitals  Enc Vitals Group     BP 02/10/22 0946 (!) 178/166     Pulse Rate 02/10/22 0946 69     Resp 02/10/22 0946 19     Temp 02/10/22 0946 98.3 F (36.8 C)     Temp Source 02/10/22 0946 Oral     SpO2 02/10/22 0946 99 %     Weight --      Height --      Head Circumference --      Peak Flow --      Pain Score 02/10/22 0951 6     Pain Loc --      Pain Edu? --      Excl. in [place]? --     Most recent vital signs: Vitals:   02/10/22 0946  BP: (!) 178/166  Pulse: 69  Resp: 19  Temp: 98.3 F (36.8 C)  SpO2: 99%    General: Awake, no distress.  CV:  Good peripheral perfusion.  Regular rate and rhythm  Resp:  Normal effort.  Equal breath sounds bilaterally.  Abd:  No distention.  Soft,  minimal diffuse tenderness without focal tenderness identified.  No rebound or guarding.   ED Results / Procedures / Treatments   RADIOLOGY  I have reviewed and interpreted the CT images.  No large abnormality such as obstruction seen on my evaluation. Radiology has read the CT scan is negative for acute process.   MEDICATIONS ORDERED IN ED: Medications  sodium chloride 0.9 % bolus 1,000 mL (has no administration in time range)  ondansetron (ZOFRAN) injection 4 mg (has no administration in time range)     IMPRESSION / MDM / ASSESSMENT AND PLAN / ED COURSE  I reviewed the triage vital signs and the nursing notes.  Patient's presentation is most consistent with acute presentation with potential threat to life or bodily function.  Patient presents emergency department for nausea vomiting diarrhea abdominal discomfort.  States most the abdominal discomfort nausea and vomiting have resolved but continues to have diarrhea.  Overall benign abdomen however given the patient's complaint of abdominal pain and fever yesterday we will proceed with a CT scan to rule out infectious etiology such as diverticulitis or colitis.  Reassuringly patient's labs have resulted largely within  normal limits including a normal CBC with a normal white blood cell count.  Normal chemistry including renal function.  We will IV hydrate with 1 L of normal saline, we will treat with Zofran and 2 mg of loperamide.  Will obtain a COVID/flu/RSV swab as well as urinalysis and continue to closely monitor.  Overall the patient appears well, no distress.  Reassuring physical exam.  Differential would include gastroenteritis, colitis, diverticulitis, influenza less likely RSV.  CT scan of the abdomen is negative.  Patient's lab work is reassuring occluding a normal CBC, normal chemistry with reassuring renal function.  Normal urinalysis with no sign of urinary tract infection.  Negative COVID/flu/RSV.  Highly suspect gastroenteritis  to be the cause of the patient's symptoms.  We will prescribe Zofran to be used if needed for any further nausea.  Patient will use over-the-counter loperamide I also discussed supportive care and increasing fluids.  Patient follow-up with her doctor.  FINAL CLINICAL IMPRESSION(S) / ED DIAGNOSES   Nausea vomiting diarrhea   Note:  This document was prepared using Dragon voice recognition software and may include unintentional dictation errors.   Harvest Dark, MD 02/10/22 1254

## 2022-02-10 NOTE — Telephone Encounter (Signed)
FYI. Thanks.

## 2022-02-11 ENCOUNTER — Encounter: Payer: Self-pay | Admitting: Nurse Practitioner

## 2022-03-03 ENCOUNTER — Other Ambulatory Visit: Payer: Self-pay

## 2022-03-03 MED ORDER — EZETIMIBE 10 MG PO TABS: 10.0000 mg | ORAL_TABLET | Freq: Every day | ORAL | 2 refills | Status: DC

## 2022-03-11 ENCOUNTER — Telehealth: Payer: Self-pay

## 2022-03-11 NOTE — Progress Notes (Signed)
Care Management & Coordination Services Pharmacy Team  Reason for Encounter: Appointment Reminder  Contacted patient to confirm telephone appointment with [name] , PharmD on 03/16/22 at 11:00. Spoke with patient on 03/11/2022   Do you have any problems getting your medications? No   What is your top health concern you would like to discuss at your upcoming visit?  Patient finally getting over abdominal discomfort  Have you seen any other providers since your last visit with PCP? No  Hospital visits:  02/10/22-Kevin Paduchowski,MD([facility])- emesis,CT scan IV fluids,use OTC imodium and start zofran if needed for nausea-no admission    Star Rating Drugs:  Medication:  Last Fill: [facility] Supply Lisinopril 49m   12/12/21 90 Rosuvastatin 225m11/30/23 90  Fill dates verified  Care Gaps: Annual wellness visit in last year? Yes   LiCharlene BrookePharmD notified  VeAvel SensorCCJonesvillessistant [phone]

## 2022-03-16 ENCOUNTER — Ambulatory Visit: Payer: PPO | Admitting: Pharmacist

## 2022-03-16 ENCOUNTER — Telehealth: Payer: Self-pay | Admitting: Pharmacist

## 2022-03-16 NOTE — Telephone Encounter (Signed)
Care Management & Coordination Services Outreach Note  03/16/2022 Name: Amy Lin MRN: [id number] DOB: 06/29/1933  Referred by: Michela Pitcher, NP  Patient had a phone appointment scheduled with clinical pharmacist today.  An unsuccessful telephone outreach was attempted today. The patient was referred to the pharmacist for assistance with medications, care management and care coordination.   Patient will NOT be penalized in any way for missing a Care Management & Coordination Services appointment. The no-show fee does not apply.  If possible, a message was left to return call to: [phone] or to [facility].  [name], PharmD, BCACP Clinical Pharmacist [facility] [phone]

## 2022-03-16 NOTE — Progress Notes (Signed)
Care Management & Coordination Services Pharmacy Note  03/16/2022 Name:  Amy Lin MRN:  [id number] DOB:  03-Jul-1933  Summary: F/U visit -HLD/CAD: LDL 88 (10/2021); reviewed differences between rosuvastatin and ezetimibe, encouraged continued compliance -Pt reports improvement in recent abdominal issues  Recommendations/Changes made from today's visit: -No med changes  Follow up plan: -Health Concierge will call patient 3 months for BP update -Pharmacist follow up televisit scheduled for 6 months -Cardiology appt 03/25/22; PCP appt 05/31/22 (CPE)    Subjective: Amy Lin is an 89 y.o. year old female who is a primary patient of Michela Pitcher, NP.  The care coordination team was consulted for assistance with disease management and care coordination needs.    Engaged with patient by telephone for follow up visit.  Recent office visits: 02/01/22 Dr Diona Browner OV: f/u covid - acute sinusitis. rx amoxicillin, prednisone. 01/06/22 NP Matt Charmian Muff [id number]. Rx Paxlovid. Hold rosuvastatin x 2 weeks. 11/26/21 NP Matt Cable OV: f/u - LDL 88. No changes  Recent consult visits: 09/22/21 Dr Shellia Carwin (Cardiology): f/u - D/c carvedilol d/t bradycardia.   Hospital visits: 02/10/22 ED visit [facility]): Gastroenteritis - CT wnl. Rx ondansetron, loperamide.   Objective:  Lab Results  Component Value Date   CREATININE 0.88 02/10/2022   BUN 19 02/10/2022   GFR 64.11 11/22/2021   EGFR 51 (L) 05/12/2021   GFRNONAA >60 02/10/2022   GFRAA 91 03/12/2008   NA 138 02/10/2022   K 3.7 02/10/2022   CALCIUM 8.9 02/10/2022   CO2 24 02/10/2022   GLUCOSE 115 (H) 02/10/2022    Lab Results  Component Value Date/Time   HGBA1C 6.3 09/25/2019 02:58 PM   HGBA1C 6.2 11/11/2015 02:13 PM   GFR 64.11 11/22/2021 10:08 AM   GFR 54.21 (L) 11/30/2020 11:01 AM    Last diabetic Eye exam: No results found for: "HMDIABEYEEXA"  Last diabetic Foot exam: No results found for: "HMDIABFOOTEX"   Lab Results   Component Value Date   CHOL 166 11/26/2021   HDL 52.20 11/26/2021   LDLCALC 88 11/26/2021   LDLDIRECT 156.0 11/06/2014   TRIG 131.0 11/26/2021   CHOLHDL 3 11/26/2021       Latest Ref Rng & Units 11/26/2021   10:13 AM 11/30/2020   11:01 AM 08/28/2020   11:04 AM  Hepatic Function  Total Protein 6.0 - 8.3 g/dL 6.9  7.1  6.6   Albumin 3.5 - 5.2 g/dL 3.8  3.9  3.6   AST 0 - 37 U/L 16  16  14   $ ALT 0 - 35 U/L 9  10  11   $ Alk Phosphatase 39 - 117 U/L 49  54  54   Total Bilirubin 0.2 - 1.2 mg/dL 0.4  0.6  0.6   Bilirubin, Direct 0.0 - 0.3 mg/dL 0.1       Lab Results  Component Value Date/Time   TSH 4.26 11/26/2021 10:13 AM   TSH 0.39 11/30/2020 11:01 AM   FREET4 1.03 11/30/2020 11:01 AM   FREET4 1.02 09/25/2019 02:58 PM       Latest Ref Rng & Units 02/10/2022    9:53 AM 11/26/2021   10:13 AM 11/30/2020   11:01 AM  CBC  WBC 4.0 - 10.5 K/uL 4.6  6.2  7.9   Hemoglobin 12.0 - 15.0 g/dL 14.0  12.1  12.1   Hematocrit 36.0 - 46.0 % 41.8  36.7  36.4   Platelets 150 - 400 K/uL 162  172.0  183.0  Lab Results  Component Value Date/Time   VD25OH 47.50 09/25/2019 02:58 PM   VD25OH 45.40 09/13/2018 12:12 PM   VITAMINB12 775 11/30/2020 11:01 AM   VITAMINB12 1,493 (H) 09/25/2019 02:58 PM    Clinical ASCVD: Yes  The ASCVD Risk score (Arnett DK, et al., 2019) failed to calculate for the following reasons:   The 2019 ASCVD risk score is only valid for ages 81 to 80   The patient has a prior MI or stroke diagnosis        12/01/2021    8:52 AM 11/30/2020   10:04 AM 06/02/2020   11:47 AM  Depression screen PHQ 2/9  Decreased Interest 0 0 0  Down, Depressed, Hopeless 0 0 0  PHQ - 2 Score 0 0 0  Altered sleeping   0  Tired, decreased energy   1  Change in appetite   1  Feeling bad or failure about yourself    0  Trouble concentrating   0  Moving slowly or fidgety/restless   0  Suicidal thoughts   0  PHQ-9 Score   2  Difficult doing work/chores   Somewhat difficult      Social History   Tobacco Use  Smoking Status Never  Smokeless Tobacco Never   BP Readings from Last 3 Encounters:  02/10/22 (!) 178/166  02/01/22 132/82  01/06/22 128/64   Pulse Readings from Last 3 Encounters:  02/10/22 69  02/01/22 61  01/06/22 84   Wt Readings from Last 3 Encounters:  02/01/22 153 lb (69.4 kg)  01/06/22 152 lb 3.2 oz (69 kg)  12/01/21 152 lb (68.9 kg)   BMI Readings from Last 3 Encounters:  02/01/22 26.68 kg/m  01/06/22 26.54 kg/m  12/01/21 26.50 kg/m    No Known Allergies  Medications Reviewed Today     Reviewed by Charlton Haws, RPH (Pharmacist) on 03/16/22 at 1146  Med List Status: <None>   Medication Order Taking? Sig Documenting Provider Last Dose Status Informant  aspirin 81 MG EC tablet [id number] Yes Take 81 mg by mouth daily. [provider] Taking Active Self  Cholecalciferol (VITAMIN D3) 2000 UNITS TABS [id number] Yes Take 1 tablet by mouth every evening. [provider] Taking Active Self  Cyanocobalamin (VITAMIN B 12 PO) [id number] Yes Take 1 tablet by mouth every evening. [provider] Taking Active Self  ezetimibe (ZETIA) 10 MG tablet [id number] Yes Take 1 tablet (10 mg total) by mouth daily. Custovic, Sabina, DO Taking Active   fluticasone (FLONASE) 50 MCG/ACT nasal spray [id number] Yes Place 1 spray into both nostrils daily. Jinny Sanders, MD Taking Active   furosemide (LASIX) 20 MG tablet [id number] Yes Take 1 tablet (20 mg total) by mouth daily as needed for fluid or edema. Cantwell, Celeste C, PA-C Taking Active   hydrocortisone 2.5 % lotion [id number] Yes Apply topically 2 (two) times daily. Michela Pitcher, NP Taking Active   levocetirizine (XYZAL) 5 MG tablet [id number] Yes Take 5 mg by mouth every evening. [provider] Taking Active   levothyroxine (SYNTHROID) 75 MCG tablet [id number] Yes TAKE 1 TABLET BY MOUTH ONCE A DAY ON AN EMPTY STOMACH.TAKE WITH A GLASS OF WATERAT LEAST 30-60 MIN  BEFORE BREAKFAST Michela Pitcher, NP Taking Active   lisinopril (ZESTRIL) 5 MG tablet [id number] Yes TAKE 1 TABLET BY MOUTH ONCE A DAY Cantwell, Celeste C, PA-C Taking Active   nitroGLYCERIN (NITROSTAT) 0.4 MG SL tablet [id number] Yes DISSOLVE 1 TABLET UNDER TONGUE AS NEEDEDFOR CHEST  PAIN. MAY REPEAT 5 MINUTES APART 3 TIMES IF NEEDED Cantwell, Celeste C, PA-C Taking Active   ondansetron (ZOFRAN-ODT) 4 MG disintegrating tablet [id number] Yes Take 1 tablet (4 mg total) by mouth every 8 (eight) hours as needed for nausea or vomiting. Harvest Dark, MD Taking Active   pantoprazole (PROTONIX) 20 MG tablet [id number] Yes TAKE 1 TABLET BY MOUTH TWICE A DAY Michela Pitcher, NP Taking Active   potassium chloride SA (KLOR-CON M) 10 MEQ tablet [id number] Yes Take 1 tablet (10 mEq total) by mouth daily. Cantwell, Celeste C, PA-C Taking Active   rosuvastatin (CRESTOR) 20 MG tablet [id number] Yes TAKE 1 TABLET BY MOUTH ONCE A DAY Cantwell, Celeste C, PA-C Taking Active             SDOH:  (Social Determinants of Health) assessments and interventions performed: No SDOH Interventions    Flowsheet Row Clinical Support from 12/01/2021 in [place] [facility] from 03/17/2021 in [place] [facility] from 03/30/2020 in [facility]  SDOH Interventions     Food Insecurity Interventions Intervention Not Indicated Intervention Not Indicated --  Housing Interventions Intervention Not Indicated -- --  Transportation Interventions Intervention Not Indicated -- --  Utilities Interventions Intervention Not Indicated -- --  Depression Interventions/Treatment  -- -- Counseling  Financial Strain Interventions Intervention Not Indicated Intervention Not Indicated --  Physical Activity Interventions Intervention Not Indicated -- --  Stress Interventions Intervention Not Indicated -- --  Social Connections Interventions  Intervention Not Indicated -- --       Medication Assistance: None required.  Patient affirms current coverage meets needs.  Medication Access: Within the past 30 days, how often has patient missed a dose of medication? 0 Is a pillbox or other method used to improve adherence? Yes  Factors that may affect medication adherence? no barriers identified Are meds synced by current pharmacy? No  Are meds delivered by current pharmacy? No  Does patient experience delays in picking up medications due to transportation concerns? No   [facility] Services Reviewed: Is patient disadvantaged to use [facility]?: Yes  Current Rx insurance plan: HTA Name and location of Current pharmacy:  [place] [address] [place] [address] Phone: [phone] Fax: [phone]  [facility] services reviewed with patient today?: No  Patient requests to transfer care to [facility]?: No  Reason patient declined to change pharmacies: Disadvantaged due to insurance/mail order  Compliance/Adherence/Medication fill history: Care Gaps: None  Star-Rating Drugs: Lisinopril - PDC 83% Rosuvastatin - PDC 0% - inaccurate. LF 12/30/21 x 90 ds, verified with pharmacy   ASSESSMENT / PLAN  Hypertension (BP goal <140/90) -Controlled - per pt report; she denies low BP since stopping carvedilol -Pt does not take lisinopril if BP is low (SBP ~100); has not needed furosemide lately -Current home readings: n/a -Current treatment: Lisinopril 5 mg daily -Appropriate, Effective, Safe, Accessible Furosemide 20 mg daily PRN - Appropriate, Effective, Safe, Accessible Klor Con 20 mEq daily PRN (w/ Lasix) -Appropriate, Effective, Safe, Accessible -Medications previously tried: triamterene/HCTZ, carvediol (bradycardia) -Current dietary habits: tries to limit salt -Current exercise habits: walks on treadmill ~1 mile -Educated on BP goals and benefits  of medications for prevention of heart attack, stroke and kidney damage; Daily salt intake goal < 2300 mg; Importance of home blood pressure monitoring; -Counseled to monitor BP at home daily -Recommend to continue current medication  Hyperlipidemia / CAD (LDL goal < 70) -Not ideally controlled - LDL 88 (10/2021) worsened from 71 previously, pt affirms compliance with rosuvastatin and ezetimibe -Hx CAD - STEMI 03/2020 s/p stent (Rec'd DAPT through at least 03/07/21) -Current treatment: Rosuvastatin 20 mg daily AM -Appropriate, Effective, Safe, Accessible Ezetimibe 10 mg daily - Appropriate, Effective, Safe, Accessible Nitroglycerin 0.4 mg SL prn -Appropriate, Effective, Safe, Accessible Aspirin 81 mg daily -Appropriate, Effective, Safe, Accessible -Medications previously tried: Brilinta x 1 year -Educated on Cholesterol goals; Benefits of statin for ASCVD risk reduction; -Reviewed differences between rosuvastatin and ezetimibe, importance of taking both to achieve LDL goal -Recommended to continue current medication  Osteoporosis (Goal prevent fractures) -Controlled - pt started Prolia 2023, she is tolerating it well -Last DEXA Scan: 10/01/2018 >> 05/12/21  T-Score femoral neck: -2.5 >> -2.7  T-Score total hip: -1.6 >> -1.7  T-Score forearm radius: -2.1 >> -2.7  10-year probability of major osteoporotic fracture: 37.3%  10-year probability of hip fracture: 27% -Patient is a candidate for pharmacologic treatment due to T-Score -1.0 to -2.5 and 10-year risk of major osteoporotic fracture > 20% and T-Score -1.0 to -2.5 and 10-year risk of hip fracture > 3% -Current treatment  Vitamin D 2000 IU - Appropriate, Effective, Safe, Accessible Prolia injection (05/28/21 - 12/02/21) - Appropriate, Query Effective -Medications previously tried: zoledronic acid (x 2 doses 2014, 2015), alendronate > 5 years  -Recommend [phone] units of vitamin D daily. Recommend 1200 mg of calcium daily from dietary and  supplemental sources. Recommend weight-bearing and muscle strengthening exercises for building and maintaining bone density. -Recommend to continue current medication;   Health Maintenance -Vaccine gaps: Shingrix, TDAP -GERD - pt on PPI BID, hx of chest pain determined to be GERD-related, will avoid dose reduction despite bone density concerns -HypoTH: on levothyroxine, TSH WNL -Recent ED visit for gastroenteritis Jan 2024, tx with zofran and immodium. Pt reports improved symptoms now   [name], PharmD, BCACP Clinical Pharmacist [facility] [phone]

## 2022-03-16 NOTE — Telephone Encounter (Signed)
Pt returned call. See CC note.

## 2022-03-16 NOTE — Patient Instructions (Signed)
Visit Information  Phone number for Pharmacist: [phone]  Thank you for meeting with me to discuss your medications! Below is a summary of what we talked about during the visit:   Recommendations/Changes made from today's visit: -No med changes  Follow up plan: -Health Concierge will call patient 3 months for BP update -Pharmacist follow up televisit scheduled for 6 months -Cardiology appt 03/25/22; PCP appt 05/31/22 (CPE)   [name], PharmD, BCACP Clinical Pharmacist [facility] at [facility] [phone]

## 2022-03-25 ENCOUNTER — Encounter: Payer: Self-pay | Admitting: Internal Medicine

## 2022-03-25 ENCOUNTER — Ambulatory Visit: Payer: PPO | Admitting: Internal Medicine

## 2022-03-25 VITALS — BP 143/79 | HR 68 | Ht 63.5 in | Wt 151.0 lb

## 2022-03-25 DIAGNOSIS — I10 Essential (primary) hypertension: Secondary | ICD-10-CM | POA: Diagnosis not present

## 2022-03-25 DIAGNOSIS — I2510 Atherosclerotic heart disease of native coronary artery without angina pectoris: Secondary | ICD-10-CM | POA: Diagnosis not present

## 2022-03-25 DIAGNOSIS — E7800 Pure hypercholesterolemia, unspecified: Secondary | ICD-10-CM

## 2022-03-25 NOTE — Progress Notes (Signed)
Primary Physician/Referring:  Michela Pitcher, NP  Patient ID: Amy Lin, female    DOB: 10-14-1933, 89 y.o.   MRN: [id number]  Chief Complaint  Patient presents with   Coronary Artery Disease   Hypertension   Follow-up   HPI:    Amy Lin  is a 89 y.o. Caucasian female with history of hypertension, hyperlipidemia, GERD who presented to [facility] emergency department 03/05/2020 with active chest pain and EKG revealing inferior STEMI.  Underwent successful angioplasty and stenting to proximal RCA, with subsequent staged intervention to OM1.  Recommend patient continue DAPT until at least 03/07/2021 if tolerated.   Patient presents for 79-monthfollow-up.  Patient has been doing well since our last visit, at least from cardiac standpoint. She had a rough go in December and January where she had about 3 different viral infections and also covid. She has recovered well from all of those and is getting back into her regular routine. Patient denies chest pain, shortness of breath, palpitations, diaphoresis, syncope, edema, PND, orthopnea.   Past Medical History:  Diagnosis Date   Allergy    Coronary artery disease    GERD (gastroesophageal reflux disease)    Hyperlipidemia    Hypertension    diuretic for edema at first   Hypothyroidism    [place]    Osteoarthritis, multiple sites    Osteoporosis    Past Surgical History:  Procedure Laterality Date   ABDOMINAL HYSTERECTOMY     BACK SURGERY  1999   Discectomy and Ray cage   BLADDER REPAIR  ~2010   CARDIAC CATHETERIZATION  03/05/2020   CARPAL TUNNEL RELEASE  8/15   COLONOSCOPY W/ BIOPSIES N/A 2016   CORONARY STENT INTERVENTION N/A 03/06/2020   Procedure: CORONARY STENT INTERVENTION;  Surgeon: GAdrian Prows MD;  [facility];  Service: Cardiovascular;  Laterality: N/A;   CORONARY THROMBECTOMY N/A 03/05/2020   Procedure: Coronary Thrombectomy;  Surgeon: GAdrian Prows MD;  [facility];   Service: Cardiovascular;  Laterality: N/A;   CORONARY/GRAFT ACUTE MI REVASCULARIZATION N/A 03/05/2020   Procedure: Coronary/Graft Acute MI Revascularization;  Surgeon: GAdrian Prows MD;  [facility];  Service: Cardiovascular;  Laterality: N/A;   INTRAVASCULAR ULTRASOUND/IVUS N/A 03/06/2020   Procedure: Intravascular Ultrasound/IVUS;  Surgeon: GAdrian Prows MD;  [facility];  Service: Cardiovascular;  Laterality: N/A;   LEFT HEART CATH AND CORONARY ANGIOGRAPHY N/A 03/05/2020   Procedure: LEFT HEART CATH AND CORONARY ANGIOGRAPHY;  Surgeon: GAdrian Prows MD;  [facility];  Service: Cardiovascular;  Laterality: N/A;   MENISCECTOMY Left 2/15   Dr NVeverly Fells  REFRACTIVE SURGERY Bilateral 07/2019   UPPER GI ENDOSCOPY N/A Feb 2017   Family History  Problem Relation Age of Onset   Heart disease Mother    Stroke Father    Hypertension Father    Heart disease Father    Diabetes Father    Breast cancer Neg Hx     Social History   Tobacco Use   Smoking status: Never   Smokeless tobacco: Never  Substance Use Topics   Alcohol use: No   Marital Status: Divorced   ROS  Review of Systems  Cardiovascular:  Negative for chest pain, claudication, dyspnea on exertion, leg swelling, near-syncope, orthopnea, palpitations, paroxysmal nocturnal dyspnea and syncope.  Respiratory:  Negative for shortness of breath.   Hematologic/Lymphatic: Does not bruise/bleed easily.  Neurological:  Negative for dizziness.  All other  systems reviewed and are negative.   Objective  Blood pressure (!) 143/79, pulse 68, height 5' 3.5" (1.613 m), weight 151 lb (68.5 kg), SpO2 96 %.     03/25/2022   11:44 AM 03/25/2022   11:34 AM 02/10/2022    9:46 AM  Vitals with BMI  Height  5' 3.5"   Weight  151 lbs   BMI  [id number]   Systolic [id number]  Diastolic 79 69 [id number]  Pulse 68 70 69     Physical Exam Vitals reviewed.  Constitutional:      Appearance: Normal appearance. She is normal  weight.  HENT:     Head: Normocephalic and atraumatic.  Neck:     Vascular: No carotid bruit.  Cardiovascular:     Rate and Rhythm: Normal rate and regular rhythm.     Pulses: Intact distal pulses.          Carotid pulses are 2+ on the right side and 2+ on the left side.      Radial pulses are 2+ on the right side and 2+ on the left side.       Femoral pulses are 2+ on the right side and 2+ on the left side.      Popliteal pulses are 2+ on the right side and 2+ on the left side.       Dorsalis pedis pulses are 0 on the right side and 0 on the left side.       Posterior tibial pulses are 0 on the left side.     Heart sounds: S1 normal and S2 normal. Murmur heard.     Midsystolic murmur is present with a grade of 2/6 at the upper right sternal border.     No gallop.  Pulmonary:     Effort: Pulmonary effort is normal.     Breath sounds: Normal breath sounds.  Abdominal:     General: Abdomen is flat. Bowel sounds are normal.     Palpations: Abdomen is soft.  Musculoskeletal:     Right lower leg: No edema (trace, ankle).     Left lower leg: No edema (trace, ankle).  Skin:    General: Skin is warm and dry.  Neurological:     Mental Status: She is alert.   Physical exam unchanged compared to previous office visit.  Laboratory examination:   Recent Labs    05/12/21 1418 11/22/21 1008 02/10/22 0953  NA 143 140 138  K 4.4 4.2 3.7  CL 104 104 103  CO2 '25 27 24  '$ GLUCOSE 103* 87 115*  BUN '20 16 19  '$ CREATININE 1.07* 0.83 0.88  CALCIUM 9.3 9.6 8.9  GFRNONAA  --   --  >60   CrCl cannot be calculated (Patient's most recent lab result is older than the maximum 21 days allowed.).     Latest Ref Rng & Units 02/10/2022    9:53 AM 11/26/2021   10:13 AM 11/22/2021   10:08 AM  CMP  Glucose 70 - 99 mg/dL 115   87   BUN 8 - 23 mg/dL 19   16   Creatinine 0.44 - 1.00 mg/dL 0.88   0.83   Sodium 135 - 145 mmol/L 138   140   Potassium 3.5 - 5.1 mmol/L 3.7   4.2   Chloride 98 - 111  mmol/L 103   104   CO2 22 - 32 mmol/L 24   27   Calcium 8.9 - 10.3 mg/dL 8.9  9.6   Total Protein 6.0 - 8.3 g/dL  6.9    Total Bilirubin 0.2 - 1.2 mg/dL  0.4    Alkaline Phos 39 - 117 U/L  49    AST 0 - 37 U/L  16    ALT 0 - 35 U/L  9        Latest Ref Rng & Units 02/10/2022    9:53 AM 11/26/2021   10:13 AM 11/30/2020   11:01 AM  CBC  WBC 4.0 - 10.5 K/uL 4.6  6.2  7.9   Hemoglobin 12.0 - 15.0 g/dL 14.0  12.1  12.1   Hematocrit 36.0 - 46.0 % 41.8  36.7  36.4   Platelets 150 - 400 K/uL 162  172.0  183.0    Lipid Panel Recent Labs    11/26/21 1013  CHOL 166  TRIG 131.0  LDLCALC 88  VLDL 26.2  HDL 52.20  CHOLHDL 3   HEMOGLOBIN A1C Lab Results  Component Value Date   HGBA1C 6.3 09/25/2019   TSH Recent Labs    11/26/21 1013  TSH 4.26  BNP    Component Value Date/Time   BNP 144.8 (H) 03/05/2021 1512    ProBNP    Component Value Date/Time   PROBNP 156.0 (H) 08/28/2020 1104    External labs:  None   Allergies  No Known Allergies   Medications Prior to Visit:   Outpatient Medications Prior to Visit  Medication Sig Dispense Refill   aspirin 81 MG EC tablet Take 81 mg by mouth daily.     Cholecalciferol (VITAMIN D3) 2000 UNITS TABS Take 1 tablet by mouth every evening.     Cyanocobalamin (VITAMIN B 12 PO) Take 1 tablet by mouth every evening.     ezetimibe (ZETIA) 10 MG tablet Take 1 tablet (10 mg total) by mouth daily. 90 tablet 2   fluticasone (FLONASE) 50 MCG/ACT nasal spray Place 1 spray into both nostrils daily. 16 g 2   hydrocortisone 2.5 % lotion Apply topically 2 (two) times daily. 59 mL 0   levocetirizine (XYZAL) 5 MG tablet Take 5 mg by mouth every evening.     levothyroxine (SYNTHROID) 75 MCG tablet TAKE 1 TABLET BY MOUTH ONCE A DAY ON AN EMPTY STOMACH.TAKE WITH A GLASS OF WATERAT LEAST 30-60 MIN BEFORE BREAKFAST 90 tablet 1   lisinopril (ZESTRIL) 5 MG tablet TAKE 1 TABLET BY MOUTH ONCE A DAY 90 tablet 3   ondansetron (ZOFRAN-ODT) 4 MG  disintegrating tablet Take 1 tablet (4 mg total) by mouth every 8 (eight) hours as needed for nausea or vomiting. 20 tablet 0   pantoprazole (PROTONIX) 20 MG tablet TAKE 1 TABLET BY MOUTH TWICE A DAY 180 tablet 2   rosuvastatin (CRESTOR) 20 MG tablet TAKE 1 TABLET BY MOUTH ONCE A DAY 90 tablet 3   furosemide (LASIX) 20 MG tablet Take 1 tablet (20 mg total) by mouth daily as needed for fluid or edema. (Patient not taking: Reported on 03/25/2022) 90 tablet 3   nitroGLYCERIN (NITROSTAT) 0.4 MG SL tablet DISSOLVE 1 TABLET UNDER TONGUE AS NEEDEDFOR CHEST PAIN. MAY REPEAT 5 MINUTES APART 3 TIMES IF NEEDED (Patient not taking: Reported on 03/25/2022) 25 tablet 3   potassium chloride SA (KLOR-CON M) 10 MEQ tablet Take 1 tablet (10 mEq total) by mouth daily. (Patient not taking: Reported on 03/25/2022) 90 tablet 3   No facility-administered medications prior to visit.   Final Medications at End of Visit    Current Meds  Medication Sig   aspirin 81 MG EC tablet Take 81 mg by mouth daily.   Cholecalciferol (VITAMIN D3) 2000 UNITS TABS Take 1 tablet by mouth every evening.   Cyanocobalamin (VITAMIN B 12 PO) Take 1 tablet by mouth every evening.   ezetimibe (ZETIA) 10 MG tablet Take 1 tablet (10 mg total) by mouth daily.   fluticasone (FLONASE) 50 MCG/ACT nasal spray Place 1 spray into both nostrils daily.   hydrocortisone 2.5 % lotion Apply topically 2 (two) times daily.   levocetirizine (XYZAL) 5 MG tablet Take 5 mg by mouth every evening.   levothyroxine (SYNTHROID) 75 MCG tablet TAKE 1 TABLET BY MOUTH ONCE A DAY ON AN EMPTY STOMACH.TAKE WITH A GLASS OF WATERAT LEAST 30-60 MIN BEFORE BREAKFAST   lisinopril (ZESTRIL) 5 MG tablet TAKE 1 TABLET BY MOUTH ONCE A DAY   ondansetron (ZOFRAN-ODT) 4 MG disintegrating tablet Take 1 tablet (4 mg total) by mouth every 8 (eight) hours as needed for nausea or vomiting.   pantoprazole (PROTONIX) 20 MG tablet TAKE 1 TABLET BY MOUTH TWICE A DAY   rosuvastatin (CRESTOR) 20  MG tablet TAKE 1 TABLET BY MOUTH ONCE A DAY   Radiology:   No results found.  Cardiac Studies:  PCV ECHOCARDIOGRAM COMPLETE 03/16/2021 Left ventricle cavity is normal in size. Moderate concentric hypertrophy of the left ventricle. Normal global wall motion. Normal LV systolic function with EF 58%. Doppler evidence of grade II (pseudonormal) diastolic dysfunction, elevated LAP. However, this assessment could be affected by moderate MAC. Left atrial cavity is mildly dilated. Trileaflet aortic valve. Moderate aortic valve leaflet calcification. No evidence of aortic stenosis. Mild (Grade I) aortic regurgitation. Moderate aortic valve leaflet calcification. No evidence of mitral stenosis. Mild to moderate mitral regurgitation. Moderate mitral valve leaflet calcification. Mild to moderate tricuspid regurgitation. No evidence of pulmonary hypertension. Compared to previous study on 03/05/2020, LV wall motion abnormality now absent.     Left Heart Catheterization and stenting to RCA 03/05/20:  LV: Normal size.  Inferior akinesis.  LVEF 40%.  No significant MR.  Severe mitral annular calcification evident.  No pressure gradient across the aortic valve. LM: Large vessel, mildly calcified. CX: Very large vessel giving origin to large OM1 small OM 2 very large OM 3.  Ostium of the circumflex has a 30% stenosis.  Very large OM1 with high-grade and irregular marginal 95% stenosis in the proximal segment.  Moderate amount of calcification is evident in the coronary vessels.  Severe tortuosity is evident in the high OM1 and also large OM 3. LAD: Large caliber vessel giving origin to a moderate to large size D1.  Mild luminal irregularities evident. RCA: Large vessel and dominant vessel.  Tortuous in the mid to distal segment.  It is occluded the proximal segment. Successful aspiration thrombectomy with Pronto V4 catheter followed by stenting with 4.0 x 30 mm resolute Onyx DES, stenosis reduced from 100% to 0%  and TIMI 0 to TIMI-3 flow.    Coronary angioplasty to OM1 03/06/2020: OCT guided PTCA and stenting of the large OM1 with 2.5 x 15 mm resolute Onyx DES, stenosis reduced from 99% to 0%, >95% stent apposition and expansion.  No edge dissection.   Recommendation: Patient will hopefully be discharged home tomorrow if she remains stable.  She will need dual antiplatelet therapy for a year in view of ACS.  Continue risk factor modification is indicated.  115 mL contrast utilized.   EKG:   09/22/2021: marked sinus bradycardia, rate 48  03/23/2021: Sinus rhythm at a rate of 60 bpm.  Normal axis.  Borderline first-degree AV block.  Inferior infarct old.  Poor R wave progression, cannot exclude anteroseptal   03/13/2020: Sinus bradycardia rate of 52 bpm, with borderline first-degree AV block..  Inferior infarct old.  Poor R wave progression, cannot exclude anteroseptal infarct old.  T wave abnormality, unchanged from previous.  Low voltage complexes, consider pulmonary disease pattern.  EKG 03/07/2020: Normal sinus rhythm with rate of 73 bpm, normal axis, T wave abnormality, consider inferolateral ischemia.  Compared to 03/06/2020, no significant change.  Assessment     ICD-10-CM   1. Coronary artery disease involving native coronary artery of native heart without angina pectoris  I25.10 EKG 12-Lead    2. Hypercholesterolemia  E78.00     3. Essential hypertension  I10        There are no discontinued medications.    No orders of the defined types were placed in this encounter.    Recommendations:   SONNI VANHOOSIER is a 89 y.o. Caucasian female with history of hypertension, hyperlipidemia, GERD who presented to [facility] emergency department 03/05/2020 with active chest pain and EKG revealing inferior STEMI.  Underwent successful angioplasty and stenting to proximal RCA, with subsequent staged intervention to OM1.  Recommend patient continue DAPT until at least 03/07/2021 if tolerated.    CAD Continue ASA, stopped Plavix in 03/2021 Nitro PRN for chest pain, she has not had to use any in a very long time  Bradycardia - resolved Stop Coreg due to symptomatic bradycardia. Symptoms have resolved since stopping coreg. Taken for 18 months post stenting, max benefit 1 year post ACS  Hypertension Continue lisinopril, take at nighttime. She feels better taking this at night Lasix PRN for leg swelling  Hyperlipidemia Continue crestor   Follow-up in 6 months, sooner if needed.   Floydene Flock, DO, [facility] 03/25/2022, 3:35 PM Office: [phone]

## 2022-04-13 ENCOUNTER — Ambulatory Visit (INDEPENDENT_AMBULATORY_CARE_PROVIDER_SITE_OTHER): Payer: PPO | Admitting: Nurse Practitioner

## 2022-04-13 ENCOUNTER — Encounter: Payer: Self-pay | Admitting: Nurse Practitioner

## 2022-04-13 VITALS — BP 130/66 | HR 75 | Temp 99.6°F | Resp 16 | Ht 63.5 in | Wt 148.2 lb

## 2022-04-13 DIAGNOSIS — M79672 Pain in left foot: Secondary | ICD-10-CM | POA: Insufficient documentation

## 2022-04-13 NOTE — Progress Notes (Signed)
   Acute Office Visit  Subjective:     Patient ID: Amy Lin, female    DOB: 1933-07-11, 89 y.o.   MRN: [id number]  Chief Complaint  Patient presents with   Foot Pain    Left foot X 2 days     Patient is in today for foot pain with a history of CAD, MI, GERD, hypothyroidism, osteoporosis   Symptoms started Monday morning. States that she was fixing her breakfast and had a really bad pain in her heel. States that it did it later that day. States it would last for a few seconds. States that she was getting gher taxes done and was sitting and it happened.  Described as a gripping terible pain  States that she tired heat, ice, tylenol and voltaren gel  States she started timing it and would last for approx 10 seconds and then would come again in approx 35 seconds  Review of Systems  Constitutional:  Negative for chills and fever.  Musculoskeletal:  Positive for joint pain.  Neurological:  Negative for weakness.        Objective:    BP 130/66   Pulse 75   Temp 99.6 F (37.6 C)   Resp 16   Ht 5' 3.5" (1.613 m)   Wt 148 lb 4 oz (67.2 kg)   SpO2 98%   BMI 25.85 kg/m    Physical Exam Vitals and nursing note reviewed.  Constitutional:      Appearance: Normal appearance.  Cardiovascular:     Pulses:          Posterior tibial pulses are 1+ on the right side and 1+ on the left side.  Musculoskeletal:        General: No tenderness or signs of injury.     Right lower leg: Edema present.     Left lower leg: Edema present.       Legs:  Skin:    General: Skin is warm.     Capillary Refill: Capillary refill takes less than 2 seconds.  Neurological:     Mental Status: She is alert.     No results found for any visits on 04/13/22.      Assessment & Plan:   Problem List Items Addressed This Visit       Other   Pain of left heel - Primary    Ambiguous in nature.  Not happening in office cannot reproduce office.  Exam benign.  Patient continues in Tylenol ice  heat Voltaren gel as needed.  Returns she will call and I can give her a short dose of oral NSAIDs to use.       No orders of the defined types were placed in this encounter.   Return if symptoms worsen or fail to improve, for As scheduled .  Romilda Garret, NP

## 2022-04-13 NOTE — Assessment & Plan Note (Signed)
Ambiguous in nature.  Not happening in office cannot reproduce office.  Exam benign.  Patient continues in Tylenol ice heat Voltaren gel as needed.  Returns she will call and I can give her a short dose of oral NSAIDs to use.

## 2022-04-13 NOTE — Patient Instructions (Addendum)
Nice to see you today I am unsure as to what caused the discomfort If it comes back let me know Keep your appointment with me in April

## 2022-05-23 ENCOUNTER — Other Ambulatory Visit: Payer: Self-pay

## 2022-05-23 ENCOUNTER — Telehealth: Payer: Self-pay

## 2022-05-23 MED ORDER — FUROSEMIDE 20 MG PO TABS: 20.0000 mg | ORAL_TABLET | Freq: Every day | ORAL | 3 refills | Status: DC | PRN

## 2022-05-23 MED ORDER — POTASSIUM CHLORIDE CRYS ER 10 MEQ PO TBCR: 10.0000 meq | EXTENDED_RELEASE_TABLET | Freq: Every day | ORAL | 3 refills | Status: DC

## 2022-05-23 NOTE — Telephone Encounter (Signed)
Prolia VOB initiated via [email or web address] 

## 2022-05-23 NOTE — Telephone Encounter (Signed)
Please start new PA for Prolia.  Last inj was on 12/02/2021.  Please respond to Barnie Mort at [facility].  I am just filling in for the week.  Thank you!

## 2022-05-23 NOTE — Telephone Encounter (Signed)
Patient called with high BP readings 183/82, Dr. Melton Alar advised she can take 10 mg lisinopril, patient called back and her BP came down 168/73. Advised her to take  daily for a few days and call back

## 2022-05-23 NOTE — Telephone Encounter (Signed)
New encounter created for Prolia BIV 

## 2022-05-24 ENCOUNTER — Other Ambulatory Visit: Payer: Self-pay | Admitting: Nurse Practitioner

## 2022-05-27 ENCOUNTER — Other Ambulatory Visit (HOSPITAL_COMMUNITY): Payer: Self-pay

## 2022-05-27 NOTE — Telephone Encounter (Signed)
   20% coinsurance, no deductible, no prior auth. needed 

## 2022-05-27 NOTE — Telephone Encounter (Signed)
Pt ready for scheduling for Prolia on or after : 05/27/22  Out-of-pocket cost due at time of visit: $302  Primary: [employer] Prolia co-insurance: 20% Admin fee co-insurance: 0%  Secondary: --- Prolia co-insurance:  Admin fee co-insurance:   Medical Benefit Details: Date Benefits were checked: 05/24/22 Deductible: NO/ Coinsurance: 20%/ Admin Fee: 0%  Prior Auth: N/A PA# Expiration Date:    Pharmacy benefit: Copay $200 If patient wants fill through the pharmacy benefit please send prescription to:  [facility] , and include estimated need by date in rx notes. Pharmacy will ship medication directly to the office.  Patient NOT eligible for Prolia Copay Card. Copay Card can make patient's cost as little as $25. Link to apply: [email or web address]  ** This summary of benefits is an estimation of the patient's out-of-pocket cost. Exact cost may very based on individual plan coverage.

## 2022-05-31 ENCOUNTER — Ambulatory Visit (INDEPENDENT_AMBULATORY_CARE_PROVIDER_SITE_OTHER): Payer: PPO | Admitting: Nurse Practitioner

## 2022-05-31 ENCOUNTER — Encounter: Payer: Self-pay | Admitting: Nurse Practitioner

## 2022-05-31 VITALS — BP 140/72 | HR 62 | Temp 99.2°F | Resp 16 | Ht 63.5 in | Wt 145.2 lb

## 2022-05-31 DIAGNOSIS — I10 Essential (primary) hypertension: Secondary | ICD-10-CM | POA: Diagnosis not present

## 2022-05-31 DIAGNOSIS — E039 Hypothyroidism, unspecified: Secondary | ICD-10-CM

## 2022-05-31 DIAGNOSIS — H9201 Otalgia, right ear: Secondary | ICD-10-CM | POA: Diagnosis not present

## 2022-05-31 DIAGNOSIS — M810 Age-related osteoporosis without current pathological fracture: Secondary | ICD-10-CM

## 2022-05-31 DIAGNOSIS — R5383 Other fatigue: Secondary | ICD-10-CM

## 2022-05-31 DIAGNOSIS — M25511 Pain in right shoulder: Secondary | ICD-10-CM

## 2022-05-31 DIAGNOSIS — E7800 Pure hypercholesterolemia, unspecified: Secondary | ICD-10-CM

## 2022-05-31 DIAGNOSIS — Z0000 Encounter for general adult medical examination without abnormal findings: Secondary | ICD-10-CM | POA: Diagnosis not present

## 2022-05-31 DIAGNOSIS — R531 Weakness: Secondary | ICD-10-CM | POA: Insufficient documentation

## 2022-05-31 LAB — LIPID PANEL
Cholesterol: 223 mg/dL — ABNORMAL HIGH (ref 0–200)
HDL: 46.6 mg/dL (ref >39.00)
LDL Cholesterol: 149 mg/dL — ABNORMAL HIGH (ref 0–99)
NonHDL: 176.81
Total CHOL/HDL Ratio: 5
Triglycerides: 138 mg/dL (ref 0.0–149.0)
VLDL: 27.6 mg/dL (ref 0.0–40.0)

## 2022-05-31 LAB — COMPREHENSIVE METABOLIC PANEL
ALT: 8 U/L (ref 0–35)
AST: 15 U/L (ref 0–37)
Albumin: 3.9 g/dL (ref 3.5–5.2)
Alkaline Phosphatase: 47 U/L (ref 39–117)
BUN: 19 mg/dL (ref 6–23)
CO2: 28 mEq/L (ref 19–32)
Calcium: 9.7 mg/dL (ref 8.4–10.5)
Chloride: 106 mEq/L (ref 96–112)
Creatinine, Ser: 0.96 mg/dL (ref 0.40–1.20)
GFR: 53.64 mL/min — ABNORMAL LOW (ref >60.00)
Glucose, Bld: 94 mg/dL (ref 70–99)
Potassium: 4.3 mEq/L (ref 3.5–5.1)
Sodium: 142 mEq/L (ref 135–145)
Total Bilirubin: 0.5 mg/dL (ref 0.2–1.2)
Total Protein: 7.1 g/dL (ref 6.0–8.3)

## 2022-05-31 LAB — CBC
HCT: 38.1 % (ref 36.0–46.0)
Hemoglobin: 12.9 g/dL (ref 12.0–15.0)
MCHC: 33.8 g/dL (ref 30.0–36.0)
MCV: 93.7 fl (ref 78.0–100.0)
Platelets: 161 10*3/uL (ref 150.0–400.0)
RBC: 4.07 Mil/uL (ref 3.87–5.11)
RDW: 13.8 % (ref 11.5–15.5)
WBC: 5.8 10*3/uL (ref 4.0–10.5)

## 2022-05-31 LAB — VITAMIN B12: Vitamin B-12: 471 pg/mL (ref 211–911)

## 2022-05-31 LAB — VITAMIN D 25 HYDROXY (VIT D DEFICIENCY, FRACTURES): VITD: 38.4 ng/mL (ref 30.00–100.00)

## 2022-05-31 LAB — TSH: TSH: 1.73 u[IU]/mL (ref 0.35–5.50)

## 2022-05-31 MED ORDER — TIZANIDINE HCL 2 MG PO TABS: 2.0000 mg | ORAL_TABLET | Freq: Every evening | ORAL | 0 refills | Status: DC | PRN

## 2022-05-31 NOTE — Progress Notes (Signed)
Established Patient Office Visit  Subjective   Patient ID: Amy Lin, female    DOB: 1933/10/04  Age: 89 y.o. MRN: [id number]  Chief Complaint  Patient presents with   Annual Exam    HPI  HTN:  curently maintained on linsinopril and lasix as needed. States that she does check her blood pressure at home    Hx of STEMI: patient is followed by Cardiology by Dr. Rae Lips.  Patient is currently on Zetia and Crestor 20 mg  GERD: on protonix.takes it every morning. States that it works well   Hypothyroidism: Patient currently maintained on levothyroxine 75 mcg daily.  Tolerates medication well  Fatigue: state that she has felt this way over the past 6 months. States that she has been taking thyroid. States that she goes to bed around 11 and gets up around 7-730. Does rested some times. Unsure if she snore  for complete physical and follow up of chronic conditions.  Immunizations: -Tetanus: Completed in 2008 update at local pharmacy  -Influenza: did not get it past year -Shingles: Zostavax.  Information given about Shingrix -Pneumonia: Up-to-date  Diet: Fair diet. States that she will have aogood breafast and dinner Light lucnh. Not snacks. States that she drinks  Exercise: No regular exercise.  Eye exam: Completes annually.  Dental exam: Needs updating  Colonoscopy: Completed in 2016, history of internal hemorrhoids.  Patient states previous GI tried to bring in internal hemorrhoids but failed.  States currently she does not want referral Lung Cancer Screening: N/A  Pap smear: hysteretomy  DEXA: 05/12/2021, patient currently on Prolia every 6 months.  Mammogram: 11/05/2021   Otalgia: right ear that hurts.  Patient has tried Flonase in the past without great relief.  States that she does have tenderness.  Has not been seen by ENT states that the otalgia is intermittent  Right shoulder: has been bothering her. States that it hurts with certain movements and sitting  still. States that it is an aching pain at rest. Sharp with movement and intermittent.       Review of Systems  Constitutional:  Negative for chills and fever.  Respiratory:  Negative for shortness of breath.   Cardiovascular:  Negative for chest pain and leg swelling.  Gastrointestinal:  Negative for abdominal pain, blood in stool, constipation, diarrhea, nausea and vomiting.       BM daily   Genitourinary:  Negative for dysuria and hematuria.  Neurological:  Negative for tingling, weakness and headaches.  Psychiatric/Behavioral:  Negative for hallucinations and suicidal ideas.       Objective:     BP (!) 140/72   Pulse 62   Temp 99.2 F (37.3 C)   Resp 16   Ht 5' 3.5" (1.613 m)   Wt 145 lb 4 oz (65.9 kg)   SpO2 98%   BMI 25.33 kg/m  BP Readings from Last 3 Encounters:  05/31/22 (!) 140/72  04/13/22 130/66  03/25/22 (!) 143/79   Wt Readings from Last 3 Encounters:  05/31/22 145 lb 4 oz (65.9 kg)  04/13/22 148 lb 4 oz (67.2 kg)  03/25/22 151 lb (68.5 kg)      Physical Exam Vitals and nursing note reviewed.  Constitutional:      Appearance: Normal appearance.  HENT:     Right Ear: Tympanic membrane, ear canal and external ear normal.     Left Ear: Tympanic membrane, ear canal and external ear normal.     Mouth/Throat:     Mouth:  Mucous membranes are moist.     Pharynx: Oropharynx is clear.  Eyes:     Extraocular Movements: Extraocular movements intact.     Pupils: Pupils are equal, round, and reactive to light.  Cardiovascular:     Rate and Rhythm: Normal rate and regular rhythm.     Pulses: Normal pulses.     Heart sounds: Normal heart sounds.  Pulmonary:     Effort: Pulmonary effort is normal.     Breath sounds: Normal breath sounds.  Abdominal:     General: Bowel sounds are normal. There is no distension.     Palpations: There is no mass.     Tenderness: There is no abdominal tenderness.     Hernia: No hernia is present.  Musculoskeletal:         General: Tenderness present. No signs of injury.       Arms:     Right lower leg: No edema.     Left lower leg: No edema.  Lymphadenopathy:     Cervical: No cervical adenopathy.  Skin:    General: Skin is warm.  Neurological:     General: No focal deficit present.     Mental Status: She is alert.     Deep Tendon Reflexes:     Reflex Scores:      Bicep reflexes are 2+ on the right side and 2+ on the left side.      Patellar reflexes are 2+ on the right side and 2+ on the left side.    Comments: Bilateral upper and lower extremity strength 5/5  Psychiatric:        Mood and Affect: Mood normal.        Behavior: Behavior normal.        Thought Content: Thought content normal.        Judgment: Judgment normal.      No results found for any visits on 05/31/22.    The ASCVD Risk score (Arnett DK, et al., 2019) failed to calculate for the following reasons:   The 2019 ASCVD risk score is only valid for ages 52 to 78   The patient has a prior MI or stroke diagnosis    Assessment & Plan:   Problem List Items Addressed This Visit       Cardiovascular and Mediastinum   Essential hypertension    Patient currently maintained on lisinopril 5 mg daily and furosemide 20 mg as needed.  Patient did have an episode of elevated blood pressure called and spoke to cardiology and tentative told her to take 2 of her furosemide blood pressure did normalize.  Blood pressure slightly above goal today but she does have ability to check blood pressure at home patient is experiencing acute pain likely causing blood pressure to be elevated secondary to that      Relevant Orders   CBC   Comprehensive metabolic panel   TSH     Endocrine   Hypothyroidism    Patient currently maintained on levothyroxine 75 mcg daily.  Taking medication as prescribed.  Pending TSH today she is having overt fatigue      Relevant Orders   TSH     Musculoskeletal and Integument   Osteoporosis    DEXA scan  up-to-date patient currently receiving Prolia continue      Relevant Orders   VITAMIN D 25 Hydroxy (Vit-D Deficiency, Fractures)     Other   Hypercholesterolemia    History of the same is followed by cardiology.  Patient currently on rosuvastatin 20 mg and Zetia 10 mg.  Continue medication as prescribed pending lipid panel today      Relevant Orders   Lipid panel   Preventative health care    Discussed age-appropriate immunizations and screening exams.  Patient is up-to-date on all vaccinations.  Patient is up-to-date on CRC screening, no longer participates in cervical cancer screening, breast cancer screening.  Patient is up-to-date on DEXA scan also.  Patient was given information at discharge about preventative healthcare maintenance with anticipatory guidance      Acute pain of right shoulder - Primary    Do think this is muscular in nature.  Will do tizanidine 2 mg nightly as needed.  Sedation precautions reviewed      Relevant Medications   tiZANidine (ZANAFLEX) 2 MG tablet   Fatigue    Patient complaining of fatigue we will check TSH, vitamin B12, vitamin D.  If all blood work is normal can consider maybe a sleep study      Relevant Orders   Vitamin B12   TSH   VITAMIN D 25 Hydroxy (Vit-D Deficiency, Fractures)   Right ear pain    Intermittent resistant to Flonase does have tinnitus ambulatory referral to ENT today      Relevant Orders   Ambulatory referral to ENT    Return in about 6 months (around 11/30/2022) for BP and chornic condiditon recheck .    Audria Nine, NP

## 2022-05-31 NOTE — Assessment & Plan Note (Signed)
DEXA scan up-to-date patient currently receiving Prolia continue

## 2022-05-31 NOTE — Assessment & Plan Note (Signed)
Patient currently maintained on levothyroxine 75 mcg daily.  Taking medication as prescribed.  Pending TSH today she is having overt fatigue

## 2022-05-31 NOTE — Assessment & Plan Note (Signed)
History of the same is followed by cardiology.  Patient currently on rosuvastatin 20 mg and Zetia 10 mg.  Continue medication as prescribed pending lipid panel today

## 2022-05-31 NOTE — Assessment & Plan Note (Signed)
Do think this is muscular in nature.  Will do tizanidine 2 mg nightly as needed.  Sedation precautions reviewed

## 2022-05-31 NOTE — Telephone Encounter (Signed)
Left message to return call to our office.  

## 2022-05-31 NOTE — Patient Instructions (Signed)
Nice to see you today I have sent in a muscle relaxer that should help for your shoulder. Use caution as it can make you sleepy Follow up with me in 6 months, sooner if you need me

## 2022-05-31 NOTE — Assessment & Plan Note (Signed)
Discussed age-appropriate immunizations and screening exams.  Patient is up-to-date on all vaccinations.  Patient is up-to-date on CRC screening, no longer participates in cervical cancer screening, breast cancer screening.  Patient is up-to-date on DEXA scan also.  Patient was given information at discharge about preventative healthcare maintenance with anticipatory guidance

## 2022-05-31 NOTE — Assessment & Plan Note (Signed)
Intermittent resistant to Flonase does have tinnitus ambulatory referral to ENT today

## 2022-05-31 NOTE — Assessment & Plan Note (Signed)
Patient currently maintained on lisinopril 5 mg daily and furosemide 20 mg as needed.  Patient did have an episode of elevated blood pressure called and spoke to cardiology and tentative told her to take 2 of her furosemide blood pressure did normalize.  Blood pressure slightly above goal today but she does have ability to check blood pressure at home patient is experiencing acute pain likely causing blood pressure to be elevated secondary to that

## 2022-05-31 NOTE — Assessment & Plan Note (Signed)
Patient complaining of fatigue we will check TSH, vitamin B12, vitamin D.  If all blood work is normal can consider maybe a sleep study

## 2022-06-01 NOTE — Telephone Encounter (Signed)
Patient called in returning call she received. 

## 2022-06-02 ENCOUNTER — Telehealth: Payer: Self-pay | Admitting: Nurse Practitioner

## 2022-06-02 NOTE — Telephone Encounter (Signed)
-----   Message from [place] sent at 06/02/2022 10:11 AM EDT ----- Called pt and she said she was taking Cholesterol medication in the am and it was making her feel sick so she stop taking it. She did she her results she started back taking it. She wanted to know when the best time for her to take her cholesterol.

## 2022-06-02 NOTE — Telephone Encounter (Signed)
Called and informed pt of this information. 

## 2022-06-02 NOTE — Telephone Encounter (Signed)
Glad that she started back on the cholesterol medication. She can take it at night before bed and that hopefully will avoid her having the nauseous feeling

## 2022-06-13 ENCOUNTER — Other Ambulatory Visit: Payer: Self-pay

## 2022-06-13 ENCOUNTER — Other Ambulatory Visit (HOSPITAL_COMMUNITY): Payer: Self-pay

## 2022-06-13 MED ORDER — DENOSUMAB 60 MG/ML ~~LOC~~ SOSY
60.0000 mg | PREFILLED_SYRINGE | Freq: Once | SUBCUTANEOUS | 0 refills | Status: DC
  Filled 2022-06-13: qty 1, 1d supply, fill #0
  Filled 2022-06-13: qty 1, 180d supply, fill #0

## 2022-06-13 NOTE — Telephone Encounter (Signed)
Called patient reviewed all following information including appointment, Co pay due at time of visit and if pick up of injection is needed from outside pharmacy.     Out of pocket for patient: $0 ordered from [facility] pharmacy   Lab appointment : 05/31/22 labs are verified ok to get injection   Nurse visit:  06/21/22  Lab order placed: No labs done 05/31/22  Prolia has been  []   Ordered  []   Script sent to local pharmacy for patient to bring   []   Script sent to Special[facility] pharmacy   [x]   Script sent to [facility] to deliver

## 2022-06-14 ENCOUNTER — Other Ambulatory Visit (HOSPITAL_COMMUNITY): Payer: Self-pay

## 2022-06-14 ENCOUNTER — Telehealth: Payer: Self-pay

## 2022-06-14 NOTE — Progress Notes (Signed)
Care Management & Coordination Services Pharmacy Team  Reason for Encounter: Hypertension  Contacted patient to discuss hypertension disease state. Spoke with patient on 06/17/2022     Current antihypertensive regimen:  Lisinopril 5 mg daily  Furosemide 20 mg daily PRN  Klor Con 20 mEq daily PRN (w/ Lasix)    Patient verbally confirms she is taking the above medications as directed. Yes  How often are you checking your Blood Pressure? 1-2x per week  she checks her blood pressure in the morning before taking her medication.  Current home BP readings: DATE:             BP               PULSE  06/16/22 124/59  -  06/13/22 121/62  -  06/10/22 134/71  -  Wrist or arm cuff: arm cuff Caffeine intake: limits Salt intake: limits OTC medications including pseudoephedrine or NSAIDs?  aspirin  Any readings above 180/100? No  What recent interventions/DTPs have been made by any provider to improve Blood Pressure control since last CPP Visit:  Patient had episode of elevated BP early in month. AM  BP of 166/94.Patient called MD with concerns.Increased Lisinopril by 1 tablet that day and BP did lower in several hours. Late pm 121/68. Patient reports elevation of legs for any fluid retention. Current weight 143 LBS.   Any recent hospitalizations or ED visits since last visit with CPP? No  What diet changes have been made to improve Blood Pressure Control?   Patient pays attention to sodium content with foods.Will choose grilled foods and fresh vegetables and drink water.  What exercise is being done to improve your Blood Pressure Control?   Patient will walk when weather permits and she stays active with outings. Patient is not sedentary.  Adherence Review: Is the patient currently on ACE/ARB medication? Yes Does the patient have >5 day gap between last estimated fill dates? No  Star Rating Drugs:  Medication:  Last Fill: Day Supply Lisinopril 5mg   03/14/22 90 Rosuvastatin 20mg    12/30/21 90 Verified date with patient   Patient currently needing refills on several medications from cardiology.last provider has left practice.Patient to ask for new refills on rosuvastatin to be sent in to pharmacy. Patient agreed with need to update medications for the year.  Chart Updates: Recent office visits:  05/31/22-[name],NP(PCP)- [facility],discussed screenings,vaccines,diet and exercise,c/o right shoulder pain,Will do tizanidine 2 mg nightly as needed. F/u 6 months. 04/13/22-[name],NP(PCP)-left foot pain, start tylenol for pain,use ice,voltaren gel,call if worsens.  Recent consult visits:  None since last contact   Hospital visits:  None in 6 months  Medications: Outpatient Encounter Medications as of 06/14/2022  Medication Sig   denosumab (PROLIA) 60 MG/ML SOSY injection Inject 60 mg into the skin once for 1 dose. Patient scheduled for 06/20/22 please send to office   aspirin 81 MG EC tablet Take 81 mg by mouth daily.   Calcium-Phosphorus-Vitamin D (CALCIUM/VITAMIN D3/ADULT GUMMY PO) Take by mouth.   Cholecalciferol (VITAMIN D3) 2000 UNITS TABS Take 1 tablet by mouth every evening.   Cyanocobalamin (VITAMIN B 12 PO) Take 1 tablet by mouth every evening.   ezetimibe (ZETIA) 10 MG tablet Take 1 tablet (10 mg total) by mouth daily.   fluticasone (FLONASE) 50 MCG/ACT nasal spray Place 1 spray into both nostrils daily.   furosemide (LASIX) 20 MG tablet Take 1 tablet (20 mg total) by mouth daily as needed for fluid or edema.   hydrocortisone  2.5 % lotion Apply topically 2 (two) times daily.   levocetirizine (XYZAL) 5 MG tablet Take 5 mg by mouth every evening.   levothyroxine (SYNTHROID) 75 MCG tablet TAKE 1 TABLET BY MOUTH ONCE A DAY ON AN EMPTY STOMACH.TAKE WITH A GLASS OF WATERAT LEAST 30-60 MIN BEFORE BREAKFAST   lisinopril (ZESTRIL) 5 MG tablet TAKE 1 TABLET BY MOUTH ONCE A DAY   nitroGLYCERIN (NITROSTAT) 0.4 MG SL tablet DISSOLVE 1 TABLET UNDER TONGUE AS NEEDEDFOR CHEST  PAIN. MAY REPEAT 5 MINUTES APART 3 TIMES IF NEEDED   ondansetron (ZOFRAN-ODT) 4 MG disintegrating tablet Take 1 tablet (4 mg total) by mouth every 8 (eight) hours as needed for nausea or vomiting.   pantoprazole (PROTONIX) 20 MG tablet TAKE ONE TABLET BY MOUTH TWICE A DAY   potassium chloride (KLOR-CON M) 10 MEQ tablet Take 1 tablet (10 mEq total) by mouth daily.   rosuvastatin (CRESTOR) 20 MG tablet TAKE 1 TABLET BY MOUTH ONCE A DAY   tiZANidine (ZANAFLEX) 2 MG tablet Take 1 tablet (2 mg total) by mouth at bedtime as needed for muscle spasms.   No facility-administered encounter medications on file as of 06/14/2022.    Recent Office Vitals: BP Readings from Last 3 Encounters:  05/31/22 (!) 140/72  04/13/22 130/66  03/25/22 (!) 143/79   Pulse Readings from Last 3 Encounters:  05/31/22 62  04/13/22 75  03/25/22 68    Wt Readings from Last 3 Encounters:  05/31/22 145 lb 4 oz (65.9 kg)  04/13/22 148 lb 4 oz (67.2 kg)  03/25/22 151 lb (68.5 kg)     Kidney Function Lab Results  Component Value Date/Time   CREATININE 0.96 05/31/2022 09:37 AM   CREATININE 0.88 02/10/2022 09:53 AM   CREATININE 0.91 (H) 07/19/2019 04:13 PM   CREATININE 1.11 (H) 07/15/2016 04:59 PM   GFR 53.64 (L) 05/31/2022 09:37 AM   GFRNONAA >60 02/10/2022 09:53 AM   GFRAA 91 03/12/2008 09:28 AM       Latest Ref Rng & Units 05/31/2022    9:37 AM 02/10/2022    9:53 AM 11/22/2021   10:08 AM  BMP  Glucose 70 - 99 mg/dL 94  161  87   BUN 6 - 23 mg/dL 19  19  16    Creatinine 0.40 - 1.20 mg/dL 0.96  0.45  4.09   Sodium 135 - 145 mEq/L 142  138  140   Potassium 3.5 - 5.1 mEq/L 4.3  3.7  4.2   Chloride 96 - 112 mEq/L 106  103  104   CO2 19 - 32 mEq/L 28  24  27    Calcium 8.4 - 10.5 mg/dL 9.7  8.9  9.6      Al Corpus, PharmD notified  Burt Knack, [facility] Clinical Pharmacy Assistant [phone]

## 2022-06-15 ENCOUNTER — Other Ambulatory Visit (HOSPITAL_COMMUNITY): Payer: Self-pay

## 2022-06-18 ENCOUNTER — Other Ambulatory Visit: Payer: Self-pay | Admitting: Internal Medicine

## 2022-06-21 ENCOUNTER — Ambulatory Visit (INDEPENDENT_AMBULATORY_CARE_PROVIDER_SITE_OTHER): Payer: PPO | Admitting: *Deleted

## 2022-06-21 DIAGNOSIS — M810 Age-related osteoporosis without current pathological fracture: Secondary | ICD-10-CM | POA: Diagnosis not present

## 2022-06-21 MED ORDER — DENOSUMAB 60 MG/ML ~~LOC~~ SOSY
60.0000 mg | PREFILLED_SYRINGE | Freq: Once | SUBCUTANEOUS | Status: AC
  Administered 2022-06-21: 60 mg via SUBCUTANEOUS

## 2022-06-21 NOTE — Progress Notes (Signed)
Per orders of James Matt Cable NP, injection of Prolia given by Crytal Pensinger. Patient tolerated injection well.  

## 2022-06-29 DIAGNOSIS — M25551 Pain in right hip: Secondary | ICD-10-CM | POA: Diagnosis not present

## 2022-06-29 DIAGNOSIS — M25561 Pain in right knee: Secondary | ICD-10-CM | POA: Diagnosis not present

## 2022-07-08 DIAGNOSIS — M545 Low back pain, unspecified: Secondary | ICD-10-CM | POA: Diagnosis not present

## 2022-07-08 DIAGNOSIS — M1611 Unilateral primary osteoarthritis, right hip: Secondary | ICD-10-CM | POA: Diagnosis not present

## 2022-07-14 LAB — HEMOGLOBIN A1C: Hemoglobin A1C: 6.6

## 2022-07-15 ENCOUNTER — Encounter: Payer: Self-pay | Admitting: Cardiology

## 2022-07-15 ENCOUNTER — Other Ambulatory Visit: Payer: Self-pay

## 2022-07-15 LAB — BASIC METABOLIC PANEL
Chloride: 105 (ref 99–108)
Creatinine: 0.9 (ref 0.5–1.1)
Glucose: 89
Potassium: 4.2 mEq/L (ref 3.5–5.1)
Sodium: 140 (ref 137–147)

## 2022-07-15 LAB — HEPATIC FUNCTION PANEL
ALT: 11 U/L (ref 7–35)
AST: 19 (ref 13–35)
Alkaline Phosphatase: 65 (ref 25–125)
Bilirubin, Direct: 0.1
Bilirubin, Total: 0.4

## 2022-07-15 LAB — CBC: RBC: 4 (ref 3.87–5.11)

## 2022-07-15 LAB — CBC AND DIFFERENTIAL
HCT: 38 (ref 36–46)
Hemoglobin: 12.3 (ref 12.0–16.0)
Neutrophils Absolute: 5.44
WBC: 7.5

## 2022-07-15 LAB — TSH: TSH: 3.6 (ref 0.41–5.90)

## 2022-07-15 LAB — COMPREHENSIVE METABOLIC PANEL
Albumin: 4.1 (ref 3.5–5.0)
Calcium: 9.3 (ref 8.7–10.7)

## 2022-07-15 LAB — LIPID PANEL
HDL: 54 (ref 35–70)
LDL Cholesterol: 82
Triglycerides: 137 (ref 40–160)

## 2022-07-15 LAB — MICROALBUMIN, URINE: Microalb, Ur: <0.3

## 2022-07-15 LAB — PROTEIN / CREATININE RATIO, URINE: Creatinine, Urine: 27

## 2022-07-15 NOTE — Telephone Encounter (Signed)
FROM PATIENT I ADDED TO HER LIST

## 2022-07-15 NOTE — Telephone Encounter (Signed)
Short term use of Meloxicam with food is okay. There is increased risk of bleeding with being on both Aspirin and Meloxicam for long term. If medication ie needed long term for hip and leg, discuss with Dr. Susann Givens if tylenol would be okay.   Thanks MJP

## 2022-07-20 ENCOUNTER — Telehealth: Payer: Self-pay | Admitting: Nurse Practitioner

## 2022-07-20 NOTE — Telephone Encounter (Signed)
I did get labs from a Dr Fayrene Fearing franklin. I have yet to review them yet

## 2022-07-20 NOTE — Telephone Encounter (Signed)
Patient would like to know if [facility] received any paper work from Dr Susann Givens from Medical management? He was suppose to send something over on Monday.

## 2022-07-20 NOTE — Telephone Encounter (Signed)
Left message to return call to our office.  

## 2022-07-21 NOTE — Telephone Encounter (Signed)
Lvm asking pt to call back. Need to relay Matt's message.  

## 2022-07-25 NOTE — Telephone Encounter (Signed)
I have reviewed the labs and they looked ok

## 2022-07-25 NOTE — Telephone Encounter (Signed)
Called and spoke to Dr. Alcide Goodness office to get the labs re faxed

## 2022-07-25 NOTE — Telephone Encounter (Signed)
Called and told the pt the information and she said that Dr. Susann Givens said she was diabetic.

## 2022-07-25 NOTE — Telephone Encounter (Signed)
Patient returned call to ask about her labs.She would like to know if Amy Lin has had a chance to review them yet?

## 2022-07-28 ENCOUNTER — Encounter: Payer: Self-pay | Admitting: Nurse Practitioner

## 2022-07-28 NOTE — Telephone Encounter (Signed)
Phone call to pt to relay information concerning A1C.  Pt is aware that she will be retested in October at next appointment.  She voiced concern over feet feeling "cushioned" but could not further describe.  She said its been an ongoing problem for a while.  Offered to make pt an appointment sooner to have [facility] evaluate.  She declined appointment at this time will call back if something changes.

## 2022-07-28 NOTE — Telephone Encounter (Signed)
I re-reviewed the labs and her A1C did come back at 6.6, this is considered diabetic but for her age it is well controlled. She does not need to be placed on any diabetic medications at this time. If she is comfortable I am ok waiting until our office visit in October and we can recheck it. For her I want her A1C under 8 but 6.6 is even better. If it goes up we will discuss diabetes medication at that juncture

## 2022-07-29 ENCOUNTER — Encounter: Payer: Self-pay | Admitting: Nurse Practitioner

## 2022-08-11 DIAGNOSIS — M545 Low back pain, unspecified: Secondary | ICD-10-CM | POA: Diagnosis not present

## 2022-08-11 DIAGNOSIS — R102 Pelvic and perineal pain: Secondary | ICD-10-CM | POA: Diagnosis not present

## 2022-08-11 DIAGNOSIS — M25551 Pain in right hip: Secondary | ICD-10-CM | POA: Diagnosis not present

## 2022-08-11 DIAGNOSIS — M47816 Spondylosis without myelopathy or radiculopathy, lumbar region: Secondary | ICD-10-CM | POA: Diagnosis not present

## 2022-08-18 ENCOUNTER — Other Ambulatory Visit: Payer: Self-pay | Admitting: Nurse Practitioner

## 2022-08-18 DIAGNOSIS — E039 Hypothyroidism, unspecified: Secondary | ICD-10-CM

## 2022-08-22 DIAGNOSIS — H938X1 Other specified disorders of right ear: Secondary | ICD-10-CM | POA: Diagnosis not present

## 2022-08-22 DIAGNOSIS — H93291 Other abnormal auditory perceptions, right ear: Secondary | ICD-10-CM | POA: Diagnosis not present

## 2022-08-22 DIAGNOSIS — H938X9 Other specified disorders of ear, unspecified ear: Secondary | ICD-10-CM | POA: Diagnosis not present

## 2022-09-01 DIAGNOSIS — M5416 Radiculopathy, lumbar region: Secondary | ICD-10-CM | POA: Diagnosis not present

## 2022-09-13 ENCOUNTER — Encounter: Payer: PPO | Admitting: Pharmacist

## 2022-09-13 DIAGNOSIS — H5203 Hypermetropia, bilateral: Secondary | ICD-10-CM | POA: Diagnosis not present

## 2022-09-13 DIAGNOSIS — H353131 Nonexudative age-related macular degeneration, bilateral, early dry stage: Secondary | ICD-10-CM | POA: Diagnosis not present

## 2022-09-13 DIAGNOSIS — Z961 Presence of intraocular lens: Secondary | ICD-10-CM | POA: Diagnosis not present

## 2022-09-14 DIAGNOSIS — M5416 Radiculopathy, lumbar region: Secondary | ICD-10-CM | POA: Diagnosis not present

## 2022-09-21 DIAGNOSIS — M5416 Radiculopathy, lumbar region: Secondary | ICD-10-CM | POA: Diagnosis not present

## 2022-09-23 ENCOUNTER — Encounter: Payer: Self-pay | Admitting: Cardiology

## 2022-09-23 ENCOUNTER — Ambulatory Visit: Payer: PPO | Admitting: Cardiology

## 2022-09-23 VITALS — BP 128/52 | HR 55 | Resp 16 | Ht 63.0 in | Wt 145.0 lb

## 2022-09-23 DIAGNOSIS — E782 Mixed hyperlipidemia: Secondary | ICD-10-CM

## 2022-09-23 DIAGNOSIS — I10 Essential (primary) hypertension: Secondary | ICD-10-CM

## 2022-09-23 DIAGNOSIS — H903 Sensorineural hearing loss, bilateral: Secondary | ICD-10-CM | POA: Diagnosis not present

## 2022-09-23 DIAGNOSIS — I2510 Atherosclerotic heart disease of native coronary artery without angina pectoris: Secondary | ICD-10-CM | POA: Diagnosis not present

## 2022-09-23 NOTE — Progress Notes (Signed)
Follow up visit  Subjective:   Amy Lin, female    DOB: 1933/09/08, 89 y.o.   MRN: [id number]  HPI  Chief Complaint  Patient presents with   Coronary Artery Disease   Hypertension   Follow-up    6 month    89 y.o. Caucasian female with hypertension, hyperlipidemia, CAD, s/p culprit and known culprit vessel PCI after STEMI 2022.  Patient is doing well, denies chest pain, shortness of breath, palpitations, orthopnea, PND, TIA/syncope. She has occasional leg edema. She had been at best semi compliant with statin and Zetia, but has improved over the past few months. Reviewed recent lipid panel results.     Current Outpatient Medications:    aspirin 81 MG EC tablet, Take 81 mg by mouth daily., Disp: , Rfl:    Calcium-Phosphorus-Vitamin D (CALCIUM/VITAMIN D3/ADULT GUMMY PO), Take by mouth., Disp: , Rfl:    Cholecalciferol (VITAMIN D3) 2000 UNITS TABS, Take 1 tablet by mouth every evening., Disp: , Rfl:    Cyanocobalamin (VITAMIN B 12 PO), Take 1 tablet by mouth every evening., Disp: , Rfl:    denosumab (PROLIA) 60 MG/ML SOSY injection, Inject 60 mg into the skin every 6 (six) months., Disp: , Rfl:    ezetimibe (ZETIA) 10 MG tablet, Take 1 tablet (10 mg total) by mouth daily., Disp: 90 tablet, Rfl: 2   fluticasone (FLONASE) 50 MCG/ACT nasal spray, Place 1 spray into both nostrils daily., Disp: 16 g, Rfl: 2   furosemide (LASIX) 20 MG tablet, Take 1 tablet (20 mg total) by mouth daily as needed for fluid or edema., Disp: 90 tablet, Rfl: 3   hydrocortisone 2.5 % lotion, Apply topically 2 (two) times daily., Disp: 59 mL, Rfl: 0   levocetirizine (XYZAL) 5 MG tablet, Take 5 mg by mouth every evening., Disp: , Rfl:    levothyroxine (SYNTHROID) 75 MCG tablet, TAKE ONE TABLET BY MOUTH ONCE A DAY. TAKE ON AN EMPTY STOMACH WITH A GLASS OF WATER ATLEAST 30-60 MIN BEFORE BREAKFAST, Disp: 90 tablet, Rfl: 1   lisinopril (ZESTRIL) 5 MG tablet, TAKE ONE TABLET BY MOUTH ONCE A DAY, Disp: 90 tablet,  Rfl: PRN   nitroGLYCERIN (NITROSTAT) 0.4 MG SL tablet, DISSOLVE 1 TABLET UNDER TONGUE AS NEEDEDFOR CHEST PAIN. MAY REPEAT 5 MINUTES APART 3 TIMES IF NEEDED, Disp: 25 tablet, Rfl: 3   pantoprazole (PROTONIX) 20 MG tablet, TAKE ONE TABLET BY MOUTH TWICE A DAY, Disp: 180 tablet, Rfl: 2   potassium chloride (KLOR-CON M) 10 MEQ tablet, Take 1 tablet (10 mEq total) by mouth daily., Disp: 90 tablet, Rfl: 3   rosuvastatin (CRESTOR) 20 MG tablet, TAKE ONE TABLET BY MOUTH ONCE A DAY, Disp: 90 tablet, Rfl: PRN   ondansetron (ZOFRAN-ODT) 4 MG disintegrating tablet, Take 1 tablet (4 mg total) by mouth every 8 (eight) hours as needed for nausea or vomiting. (Patient not taking: Reported on 09/23/2022), Disp: 20 tablet, Rfl: 0   Cardiovascular & other pertient studies:  Reviewed external labs and tests, independently interpreted  EKG 09/23/2022: Sinus rhythm 61 bpm with rate variation  Low voltage in precordial leads  Echocardiogram 03/16/2021: Left ventricle cavity is normal in size. Moderate concentric hypertrophy of the left ventricle. Normal global wall motion. Normal LV systolic function with EF 58%. Doppler evidence of grade II (pseudonormal) diastolic dysfunction, elevated LAP. However, this assessment could be affected by moderate MAC.   Left atrial cavity is mildly dilated. Trileaflet aortic valve. Moderate aortic valve leaflet calcification. No evidence of aortic  stenosis. Mild (Grade I) aortic regurgitation. Moderate aortic valve leaflet calcification. No evidence of mitral stenosis. Mild to moderate mitral regurgitation. Moderate mitral valve leaflet calcification. Mild to moderate tricuspid regurgitation. No evidence of pulmonary hypertension. Compared to previous study on 03/05/2020, LV wall motion abnormality now absent.    Coronary angioplasty to OM1 03/06/2020: OCT guided PTCA and stenting of the large OM1 with 2.5 x 15 mm resolute Onyx DES, stenosis reduced from 99% to 0%, >95% stent  apposition and expansion.  No edge dissection.   Recommendation: Patient will hopefully be discharged home tomorrow if she remains stable.  She will need dual antiplatelet therapy for a year in view of ACS.  Continue risk factor modification is indicated.  115 mL contrast utilized.  Left Heart Catheterization and stenting to RCA 03/05/2020:  LV: Normal size.  Inferior akinesis.  LVEF 40%.  No significant MR.  Severe mitral annular calcification evident.  No pressure gradient across the aortic valve. LM: Large vessel, mildly calcified. CX: Very large vessel giving origin to large OM1 small OM 2 very large OM 3.  Ostium of the circumflex has a 30% stenosis.  Very large OM1 with high-grade and irregular marginal 95% stenosis in the proximal segment.  Moderate amount of calcification is evident in the coronary vessels.  Severe tortuosity is evident in the high OM1 and also large OM 3. LAD: Large caliber vessel giving origin to a moderate to large size D1.  Mild luminal irregularities evident. RCA: Large vessel and dominant vessel.  Tortuous in the mid to distal segment.  It is occluded the proximal segment. Successful aspiration thrombectomy with Pronto V4 catheter followed by stenting with 4.0 x 30 mm resolute Onyx DES, stenosis reduced from 100% to 0% and TIMI 0 to TIMI-3 flow.   Recommendation: Patient has a very large OM1 that is cardiac complex high-grade stenosis that needs elective angioplasty.  The lesion is extremely complex with moderate to heavy calcification noted in the vessel distal to the stenosis and also has moderate diffuse disease of at least 30%.  The ostial circumflex also has moderate amount of disease.  Will consider either outpatient elective angioplasty in 2 weeks versus PCI prior to discharge in view of very large distribution and a large vessel in the operating.  100 mL contrast utilized.    Recent labs: 07/15/2022: Glucose 89, BUN/Cr NA/0.9. EGFR NA. Na/K 140/4.2. Rest of the  CMP normal H/H 12/38. MCV NA. Platelets NA HbA1C 6.6% Chol NA, TG 137, HDL 54, LDL 82 TSH 3.6 normal   Review of Systems  Cardiovascular:  Negative for chest pain, dyspnea on exertion, leg swelling, palpitations and syncope.         Vitals:   09/23/22 1124  BP: (!) 128/52  Pulse: (!) 55  Resp: 16  SpO2: 97%    Body mass index is 25.69 kg/m. Filed Weights   09/23/22 1124  Weight: 145 lb (65.8 kg)     Objective:   Physical Exam Vitals and nursing note reviewed.  Constitutional:      General: She is not in acute distress. Neck:     Vascular: No JVD.  Cardiovascular:     Rate and Rhythm: Normal rate and regular rhythm.     Heart sounds: Murmur heard.     High-pitched blowing holosystolic murmur is present with a grade of 2/6 at the apex.  Pulmonary:     Effort: Pulmonary effort is normal.     Breath sounds: Normal breath sounds. No  wheezing or rales.  Musculoskeletal:     Right lower leg: No edema.     Left lower leg: No edema.           Visit diagnoses:   ICD-10-CM   1. Coronary artery disease involving native coronary artery of native heart without angina pectoris  I25.10 EKG 12-Lead    PCV ECHOCARDIOGRAM COMPLETE    CANCELED: PCV ECHOCARDIOGRAM COMPLETE    2. Essential hypertension  I10     3. Mixed hyperlipidemia  E78.2        Orders Placed This Encounter  Procedures   EKG 12-Lead    Assessment & Recommendations:   89 y.o. Caucasian female with hypertension, hyperlipidemia, CAD, s/p culprit and known culprit vessel PCI after STEMI 2022.  CAD: No angina.  Continue Aspirin, statin, Zetia. Lipids are improving. No change made today.  MR, AI: Mild to mod. Repeat echocardiogram in 6 months  F/u in 6 months   Elder Negus, MD Pager: [phone] Office: [phone]

## 2022-10-19 ENCOUNTER — Other Ambulatory Visit: Payer: Self-pay | Admitting: Nurse Practitioner

## 2022-10-19 DIAGNOSIS — Z1231 Encounter for screening mammogram for malignant neoplasm of breast: Secondary | ICD-10-CM

## 2022-10-21 ENCOUNTER — Encounter: Payer: Self-pay | Admitting: Family Medicine

## 2022-10-21 ENCOUNTER — Ambulatory Visit (INDEPENDENT_AMBULATORY_CARE_PROVIDER_SITE_OTHER): Payer: PPO | Admitting: Family Medicine

## 2022-10-21 VITALS — BP 130/76 | HR 68 | Temp 98.6°F | Ht 63.0 in | Wt 144.1 lb

## 2022-10-21 DIAGNOSIS — J0111 Acute recurrent frontal sinusitis: Secondary | ICD-10-CM

## 2022-10-21 DIAGNOSIS — L989 Disorder of the skin and subcutaneous tissue, unspecified: Secondary | ICD-10-CM

## 2022-10-21 MED ORDER — CEPHALEXIN 500 MG PO CAPS: 500.0000 mg | ORAL_CAPSULE | Freq: Three times a day (TID) | ORAL | 0 refills | Status: DC

## 2022-10-21 MED ORDER — TRIAMCINOLONE ACETONIDE 0.5 % EX CREA: 1.0000 | TOPICAL_CREAM | Freq: Two times a day (BID) | CUTANEOUS | 0 refills | Status: DC

## 2022-10-21 NOTE — Assessment & Plan Note (Signed)
Acute, symptoms possibly secondary to viral infection but given symptoms ongoing greater than 7 days with patient's history of recurrent sinus infection and upcoming trip, will treat with antibiotics. Also encouraged her to increase Flonase to 2 sprays per nostril daily. Chose antibiotic for coverage also of leg issue. Keflex 500 mg 3 times daily x 7 days.

## 2022-10-21 NOTE — Progress Notes (Signed)
Patient ID: Amy Lin, female    DOB: 1933-08-11, 89 y.o.   MRN: [id number]  This visit was conducted in person.  BP 130/76 (BP Location: Left Arm, Patient Position: Sitting, Cuff Size: Normal)   Pulse 68   Temp 98.6 F (37 C) (Temporal)   Ht 5\' 3"  (1.6 m)   Wt 144 lb 2 oz (65.4 kg)   SpO2 99%   BMI 25.53 kg/m    CC:  Chief Complaint  Patient presents with   Nasal Congestion    > 1 week but seem to be getting worse   Ear Pain    Right   Fever    Low Grade   Skin Lesion    Right Shin    Subjective:   HPI: Amy Lin is a 89 y.o. female presenting on 10/21/2022 for Nasal Congestion (> 1 week but seem to be getting worse), Ear Pain (Right), Fever (Low Grade), and Skin Lesion (Right Shin)   Date of onset: 1 week Initial symptoms included nasal congestion Symptoms progressed to right ear pain ( pressure/ache) ( has been chronic though, usually worse with congestion), low-grade fever Right sided facial pain.  Post nasal drip.  No SO,  dry cough   Sick contacts: none COVID testing:   none     She has tried to treat with  flonase 1 sprays per nostril daily.  Coricidin   No history of chronic lung disease such as asthma or COPD. Has history of coronary artery disease, allergic rhinitis No recent antibiotics. Non-smoker.  She has also noted a skin lesion on her left shin.. noted 1 weeks ago, stinging.  No injury.  Has cleaned with alcohol. Has applied hydrocortisone cream.       Relevant past medical, surgical, family and social history reviewed and updated as indicated. Interim medical history since our last visit reviewed. Allergies and medications reviewed and updated. Outpatient Medications Prior to Visit  Medication Sig Dispense Refill   aspirin 81 MG EC tablet Take 81 mg by mouth daily.     Calcium-Phosphorus-Vitamin D (CALCIUM/VITAMIN D3/ADULT GUMMY PO) Take by mouth.     Cholecalciferol (VITAMIN D3) 2000 UNITS TABS Take 1 tablet by mouth every  evening.     denosumab (PROLIA) 60 MG/ML SOSY injection Inject 60 mg into the skin every 6 (six) months.     ezetimibe (ZETIA) 10 MG tablet Take 1 tablet (10 mg total) by mouth daily. 90 tablet 2   fluticasone (FLONASE) 50 MCG/ACT nasal spray Place 1 spray into both nostrils daily. 16 g 2   furosemide (LASIX) 20 MG tablet Take 1 tablet (20 mg total) by mouth daily as needed for fluid or edema. 90 tablet 3   hydrocortisone 2.5 % lotion Apply topically 2 (two) times daily. 59 mL 0   levocetirizine (XYZAL) 5 MG tablet Take 5 mg by mouth every evening.     levothyroxine (SYNTHROID) 75 MCG tablet TAKE ONE TABLET BY MOUTH ONCE A DAY. TAKE ON AN EMPTY STOMACH WITH A GLASS OF WATER ATLEAST 30-60 MIN BEFORE BREAKFAST 90 tablet 1   lisinopril (ZESTRIL) 5 MG tablet TAKE ONE TABLET BY MOUTH ONCE A DAY 90 tablet PRN   nitroGLYCERIN (NITROSTAT) 0.4 MG SL tablet DISSOLVE 1 TABLET UNDER TONGUE AS NEEDEDFOR CHEST PAIN. MAY REPEAT 5 MINUTES APART 3 TIMES IF NEEDED 25 tablet 3   ondansetron (ZOFRAN-ODT) 4 MG disintegrating tablet Take 1 tablet (4 mg total) by mouth every 8 (eight) hours as  needed for nausea or vomiting. 20 tablet 0   pantoprazole (PROTONIX) 20 MG tablet TAKE ONE TABLET BY MOUTH TWICE A DAY 180 tablet 2   potassium chloride (KLOR-CON M) 10 MEQ tablet Take 1 tablet (10 mEq total) by mouth daily. 90 tablet 3   rosuvastatin (CRESTOR) 20 MG tablet TAKE ONE TABLET BY MOUTH ONCE A DAY 90 tablet PRN   Cyanocobalamin (VITAMIN B 12 PO) Take 1 tablet by mouth every evening. (Patient not taking: Reported on 10/21/2022)     No facility-administered medications prior to visit.     Per HPI unless specifically indicated in ROS section below Review of Systems  Constitutional:  Negative for fatigue and fever.  HENT:  Negative for congestion.   Eyes:  Negative for pain.  Respiratory:  Negative for cough and shortness of breath.   Cardiovascular:  Negative for chest pain, palpitations and leg swelling.   Gastrointestinal:  Negative for abdominal pain.  Genitourinary:  Negative for dysuria and vaginal bleeding.  Musculoskeletal:  Negative for back pain.  Neurological:  Negative for syncope, light-headedness and headaches.  Psychiatric/Behavioral:  Negative for dysphoric mood.    Objective:  BP 130/76 (BP Location: Left Arm, Patient Position: Sitting, Cuff Size: Normal)   Pulse 68   Temp 98.6 F (37 C) (Temporal)   Ht 5\' 3"  (1.6 m)   Wt 144 lb 2 oz (65.4 kg)   SpO2 99%   BMI 25.53 kg/m   Wt Readings from Last 3 Encounters:  10/21/22 144 lb 2 oz (65.4 kg)  09/23/22 145 lb (65.8 kg)  05/31/22 145 lb 4 oz (65.9 kg)      Physical Exam Constitutional:      General: She is not in acute distress.    Appearance: Normal appearance. She is well-developed. She is not ill-appearing or toxic-appearing.  HENT:     Head: Normocephalic.     Right Ear: Hearing, tympanic membrane, ear canal and external ear normal. No middle ear effusion. Tympanic membrane is not erythematous, retracted or bulging.     Left Ear: Hearing, ear canal and external ear normal. A middle ear effusion is present. Tympanic membrane is not erythematous, retracted or bulging.     Nose: No mucosal edema or rhinorrhea.     Right Sinus: Frontal sinus tenderness present. No maxillary sinus tenderness.     Left Sinus: No maxillary sinus tenderness or frontal sinus tenderness.     Mouth/Throat:     Mouth: Oropharynx is clear and moist and mucous membranes are normal.     Pharynx: Uvula midline.  Eyes:     General: Lids are normal. Lids are everted, no foreign bodies appreciated.     Extraocular Movements: EOM normal.     Conjunctiva/sclera: Conjunctivae normal.     Pupils: Pupils are equal, round, and reactive to light.  Neck:     Thyroid: No thyroid mass or thyromegaly.     Vascular: No carotid bruit.     Trachea: Trachea normal.  Cardiovascular:     Rate and Rhythm: Normal rate and regular rhythm.     Pulses: Normal  pulses.     Heart sounds: Normal heart sounds, S1 normal and S2 normal. No murmur heard.    No friction rub. No gallop.  Pulmonary:     Effort: Pulmonary effort is normal. No tachypnea or respiratory distress.     Breath sounds: Normal breath sounds. No decreased breath sounds, wheezing, rhonchi or rales.  Abdominal:     General: Bowel  sounds are normal.     Palpations: Abdomen is soft.     Tenderness: There is no abdominal tenderness.  Musculoskeletal:     Cervical back: Normal range of motion and neck supple.  Skin:    General: Skin is warm, dry and intact.     Findings: No rash.     Comments:  See photo,  Neurological:     Mental Status: She is alert.  Psychiatric:        Mood and Affect: Mood is not anxious or depressed.        Speech: Speech normal.        Behavior: Behavior normal. Behavior is cooperative.        Thought Content: Thought content normal.        Cognition and Memory: Cognition and memory normal.        Judgment: Judgment normal.       Results for orders placed or performed in visit on 07/28/22  Hemoglobin A1c  Result Value Ref Range   Hemoglobin A1C 6.6    Albumin, Urine POC <0..3    Creatinine, POC 27 mg/dL   EGFR (Non-African Amer.) 64     Assessment and Plan  Acute recurrent frontal sinusitis Assessment & Plan: Acute, symptoms possibly secondary to viral infection but given symptoms ongoing greater than 7 days with patient's history of recurrent sinus infection and upcoming trip, will treat with antibiotics. Also encouraged her to increase Flonase to 2 sprays per nostril daily. Chose antibiotic for coverage also of leg issue. Keflex 500 mg 3 times daily x 7 days.    Skin lesion Assessment & Plan: Acute,  Unusual appearance of skin lesion on right lower leg somewhat concerning for basal cell carcinoma.  Given fast onset though may be bacterial infection versus allergic reaction to bite. Will treat with Keflex 500 mg 3 times daily to cover  for bacterial infection, encouraged warm compresses and start applying triamcinolone 0.5 mg twice daily.  Return and ER precautions provided.   Other orders -     Cephalexin; Take 1 capsule (500 mg total) by mouth 3 (three) times daily.  Dispense: 21 capsule; Refill: 0 -     Triamcinolone Acetonide; Apply 1 Application topically 2 (two) times daily.  Dispense: 30 g; Refill: 0    No follow-ups on file.   Kerby Nora, MD

## 2022-10-21 NOTE — Patient Instructions (Addendum)
Increase flonase to 2 sprays per nostril daily.  Apply topical  steroid cream.  Complete antibiotics for possible right frontal sinusitis and leg issue.

## 2022-10-21 NOTE — Assessment & Plan Note (Signed)
Acute,  Unusual appearance of skin lesion on right lower leg somewhat concerning for basal cell carcinoma.  Given fast onset though may be bacterial infection versus allergic reaction to bite. Will treat with Keflex 500 mg 3 times daily to cover for bacterial infection, encouraged warm compresses and start applying triamcinolone 0.5 mg twice daily.  Return and ER precautions provided.

## 2022-10-22 ENCOUNTER — Encounter (HOSPITAL_COMMUNITY): Payer: Self-pay

## 2022-11-10 ENCOUNTER — Ambulatory Visit
Admission: RE | Admit: 2022-11-10 | Discharge: 2022-11-10 | Disposition: A | Discharge status: Home / Self Care | Payer: PPO | Source: Ambulatory Visit | Attending: Nurse Practitioner | Admitting: Nurse Practitioner

## 2022-11-10 DIAGNOSIS — Z1231 Encounter for screening mammogram for malignant neoplasm of breast: Secondary | ICD-10-CM

## 2022-11-15 ENCOUNTER — Encounter: Payer: Self-pay | Admitting: Nurse Practitioner

## 2022-11-15 ENCOUNTER — Other Ambulatory Visit: Payer: Self-pay | Admitting: Nurse Practitioner

## 2022-11-15 DIAGNOSIS — L989 Disorder of the skin and subcutaneous tissue, unspecified: Secondary | ICD-10-CM

## 2022-11-15 DIAGNOSIS — R921 Mammographic calcification found on diagnostic imaging of breast: Secondary | ICD-10-CM

## 2022-11-15 NOTE — Telephone Encounter (Signed)
See mychart about skin lesion.

## 2022-11-24 ENCOUNTER — Other Ambulatory Visit: Payer: Self-pay | Admitting: Nurse Practitioner

## 2022-11-24 ENCOUNTER — Ambulatory Visit
Admission: RE | Admit: 2022-11-24 | Discharge: 2022-11-24 | Disposition: A | Discharge status: Home / Self Care | Payer: PPO | Source: Ambulatory Visit | Attending: Nurse Practitioner | Admitting: Nurse Practitioner

## 2022-11-24 ENCOUNTER — Other Ambulatory Visit: Payer: Self-pay

## 2022-11-24 DIAGNOSIS — R921 Mammographic calcification found on diagnostic imaging of breast: Secondary | ICD-10-CM | POA: Diagnosis not present

## 2022-11-25 ENCOUNTER — Other Ambulatory Visit: Payer: Self-pay

## 2022-11-25 ENCOUNTER — Encounter: Payer: Self-pay | Admitting: Nurse Practitioner

## 2022-11-30 ENCOUNTER — Encounter: Payer: Self-pay | Admitting: Nurse Practitioner

## 2022-11-30 ENCOUNTER — Ambulatory Visit
Admission: RE | Admit: 2022-11-30 | Discharge: 2022-11-30 | Disposition: A | Discharge status: Home / Self Care | Payer: PPO | Source: Ambulatory Visit | Attending: Nurse Practitioner

## 2022-11-30 ENCOUNTER — Other Ambulatory Visit: Payer: Self-pay

## 2022-11-30 ENCOUNTER — Ambulatory Visit: Payer: PPO | Admitting: Nurse Practitioner

## 2022-11-30 VITALS — BP 110/60 | HR 57 | Temp 98.1°F | Ht 63.0 in | Wt 144.4 lb

## 2022-11-30 DIAGNOSIS — L989 Disorder of the skin and subcutaneous tissue, unspecified: Secondary | ICD-10-CM | POA: Diagnosis not present

## 2022-11-30 DIAGNOSIS — M79662 Pain in left lower leg: Secondary | ICD-10-CM | POA: Diagnosis not present

## 2022-11-30 DIAGNOSIS — M79605 Pain in left leg: Secondary | ICD-10-CM

## 2022-11-30 DIAGNOSIS — M7989 Other specified soft tissue disorders: Secondary | ICD-10-CM | POA: Diagnosis not present

## 2022-11-30 DIAGNOSIS — R600 Localized edema: Secondary | ICD-10-CM | POA: Diagnosis not present

## 2022-11-30 DIAGNOSIS — R202 Paresthesia of skin: Secondary | ICD-10-CM

## 2022-11-30 DIAGNOSIS — I10 Essential (primary) hypertension: Secondary | ICD-10-CM

## 2022-11-30 LAB — COMPREHENSIVE METABOLIC PANEL
ALT: 10 U/L (ref 0–35)
AST: 17 U/L (ref 0–37)
Albumin: 3.7 g/dL (ref 3.5–5.2)
Alkaline Phosphatase: 54 U/L (ref 39–117)
BUN: 15 mg/dL (ref 6–23)
CO2: 28 meq/L (ref 19–32)
Calcium: 9.5 mg/dL (ref 8.4–10.5)
Chloride: 105 meq/L (ref 96–112)
Creatinine, Ser: 0.94 mg/dL (ref 0.40–1.20)
GFR: 54.82 mL/min — ABNORMAL LOW (ref >60.00)
Glucose, Bld: 88 mg/dL (ref 70–99)
Potassium: 4.7 meq/L (ref 3.5–5.1)
Sodium: 139 meq/L (ref 135–145)
Total Bilirubin: 0.4 mg/dL (ref 0.2–1.2)
Total Protein: 6.9 g/dL (ref 6.0–8.3)

## 2022-11-30 LAB — CBC
HCT: 37.6 % (ref 36.0–46.0)
Hemoglobin: 12.4 g/dL (ref 12.0–15.0)
MCHC: 32.9 g/dL (ref 30.0–36.0)
MCV: 95 fL (ref 78.0–100.0)
Platelets: 159 10*3/uL (ref 150.0–400.0)
RBC: 3.95 Mil/uL (ref 3.87–5.11)
RDW: 13.1 % (ref 11.5–15.5)
WBC: 6.7 10*3/uL (ref 4.0–10.5)

## 2022-11-30 LAB — TSH: TSH: 1.72 u[IU]/mL (ref 0.35–5.50)

## 2022-11-30 LAB — VITAMIN B12: Vitamin B-12: 181 pg/mL — ABNORMAL LOW (ref 211–911)

## 2022-11-30 NOTE — Assessment & Plan Note (Signed)
Blood pressure well-controlled.  Patient not experiencing regular lightheadedness or dizziness.  Continue medication as prescribed.  Patient is currently on lisinopril 5 mg daily and furosemide 20 mg as needed.

## 2022-11-30 NOTE — Assessment & Plan Note (Signed)
History of the same.  Has been seen by colleague and referred to dermatology.  No acute signs of infection today.  Continue to follow-up with dermatology as scheduled

## 2022-11-30 NOTE — Assessment & Plan Note (Signed)
Bilateral plantar distal feet.  Pending basic labs PMS intact

## 2022-11-30 NOTE — Assessment & Plan Note (Signed)
Patient's left lower extremity larger than right.  Does have some tenderness posteriorly along the calf.  Will order stat ultrasound to rule out DVT.  Pending result

## 2022-11-30 NOTE — Patient Instructions (Signed)
Nice to see you toyda I will be in touch with the labs once I have them They will contact you about the ultrasound If you get chest pain or shortness of breath go to the hospital  Follow up with me in 6 months for physical

## 2022-11-30 NOTE — Progress Notes (Signed)
Established Patient Office Visit  Subjective   Patient ID: Amy Lin, female    DOB: Nov 16, 1933  Age: 89 y.o. MRN: [id number]  Chief Complaint  Patient presents with   Hypertension   swollen leg    Pt complains of left leg still being swollen. States the back of leg is painful and feels hard when touched       HTN: currenlty on lisinopirl and lasix as needed. States that she deos not traditionally have dizziness  States that approx 3 days ago she was rutning and her left knee gave out and she fell. State that she did hit the floor. States no LOC or head injury. State that her left shoulder has been bothering her. States that she has a history of the left knee giving out in the past.   Leg swelling: state that the left leg has been swollen for the past 2 weeks. States that the callf was hard and is tender to the touch. States that the cla is less tighter than it has been in the past. States that the swelling will not go down with elevation.  Of note patient did take a trip to [place] earlier this month where she flew.  Patient does not think it was 4 hours in length or longer.  Patient has no history of DVTs.  Patient also does not use exogenous hormones  Skin lesion: it is on the right lower leg. States that it has been sore for approx 1 month. States that she has noticed any discharge.  She was seen by Dr. Kerby Nora. She was referred to [facility].       Review of Systems  Constitutional:  Negative for chills and fever.  Respiratory:  Negative for shortness of breath.   Cardiovascular:  Positive for leg swelling. Negative for chest pain.  Musculoskeletal:  Positive for joint pain.  Neurological:  Negative for headaches.      Objective:     BP 110/60   Pulse (!) 57   Temp 98.1 F (36.7 C) (Oral)   Ht 5\' 3"  (1.6 m)   Wt 144 lb 6.4 oz (65.5 kg)   SpO2 96%   BMI 25.58 kg/m  BP Readings from Last 3 Encounters:  11/30/22 110/60  10/21/22 130/76  09/23/22 (!) 128/52    Wt Readings from Last 3 Encounters:  11/30/22 144 lb 6.4 oz (65.5 kg)  10/21/22 144 lb 2 oz (65.4 kg)  09/23/22 145 lb (65.8 kg)   SpO2 Readings from Last 3 Encounters:  11/30/22 96%  10/21/22 99%  09/23/22 97%      Physical Exam Vitals and nursing note reviewed.  Constitutional:      Appearance: Normal appearance.  Cardiovascular:     Rate and Rhythm: Normal rate and regular rhythm.     Pulses:          Dorsalis pedis pulses are 2+ on the right side and 2+ on the left side.       Posterior tibial pulses are 2+ on the left side.     Heart sounds: Normal heart sounds.     Comments: Left calf tender to palpation on the posterior side Pulmonary:     Effort: Pulmonary effort is normal.     Breath sounds: Normal breath sounds.  Musculoskeletal:     Right lower leg: No edema.     Left lower leg: Edema present.     Comments: Left calf 38.5 Right calf 36  Skin:  Neurological:     Mental Status: She is alert.      No results found for any visits on 11/30/22.    The ASCVD Risk score (Arnett DK, et al., 2019) failed to calculate for the following reasons:   The 2019 ASCVD risk score is only valid for ages 91 to 30   The patient has a prior MI or stroke diagnosis    Assessment & Plan:   Problem List Items Addressed This Visit       Cardiovascular and Mediastinum   Essential hypertension    Blood pressure well-controlled.  Patient not experiencing regular lightheadedness or dizziness.  Continue medication as prescribed.  Patient is currently on lisinopril 5 mg daily and furosemide 20 mg as needed.      Relevant Orders   CBC   Comprehensive metabolic panel     Musculoskeletal and Integument   Skin lesion    History of the same.  Has been seen by colleague and referred to dermatology.  No acute signs of infection today.  Continue to follow-up with dermatology as scheduled        Other   Paresthesia    Bilateral plantar distal feet.  Pending basic labs  PMS intact      Relevant Orders   Vitamin B12   TSH   Pain and swelling of left lower extremity - Primary    Patient's left lower extremity larger than right.  Does have some tenderness posteriorly along the calf.  Will order stat ultrasound to rule out DVT.  Pending result      Relevant Orders   CBC   Comprehensive metabolic panel   LOWER EXTREMITY VENOUS-LEFT (BACK OFFICE)   US Venous Img Lower Unilateral Left    Return in about 6 months (around 05/31/2023) for CPE and Labs.    Audria Nine, NP

## 2022-12-02 ENCOUNTER — Telehealth: Payer: Self-pay | Admitting: Nurse Practitioner

## 2022-12-02 ENCOUNTER — Other Ambulatory Visit (HOSPITAL_COMMUNITY): Payer: Self-pay

## 2022-12-02 ENCOUNTER — Other Ambulatory Visit: Payer: Self-pay | Admitting: Nurse Practitioner

## 2022-12-02 ENCOUNTER — Other Ambulatory Visit: Payer: Self-pay

## 2022-12-02 MED ORDER — PROLIA 60 MG/ML ~~LOC~~ SOSY
60.0000 mg | PREFILLED_SYRINGE | Freq: Once | SUBCUTANEOUS | 0 refills | Status: AC
  Filled 2022-12-02: qty 1, 180d supply, fill #0

## 2022-12-02 NOTE — Progress Notes (Signed)
LVM on [phone]. Copay for Prolia is $200, same as last fill. [facility] account was used in May for Prolia and patient paid the balance off. Called to confirm if patient wants to use AR again and for approval of $200 copay.

## 2022-12-02 NOTE — Telephone Encounter (Signed)
Can we get her scheduled for B12 injections. We can do of B12 Im once a week for 4 weeks. Then Vitamin B12 IM once every 2 weeks. Them vitamin B12 IM once a month moving forward. I want to check her B12 on the visit that is the once a month one

## 2022-12-02 NOTE — Telephone Encounter (Signed)
Pt called returning Janae's call regarding results. Told pt Cable's response. Pt states she want to do B12 injections instead of the oral vitamins. Pt asked how often would she need to get the injections? Call back # [phone].

## 2022-12-02 NOTE — Progress Notes (Signed)
Specialty Pharmacy Refill Coordination Note  Amy Lin is a 89 y.o. female contacted today regarding refills of specialty medication(s) Denosumab   Patient requested Delivery   Delivery date: 12/12/22   Verified address: [facility] at [facility]   Medication will be filled on 12/09/22 or when refill approved.  Please call patient with updated copay.

## 2022-12-05 ENCOUNTER — Ambulatory Visit: Payer: PPO | Admitting: Dermatology

## 2022-12-05 ENCOUNTER — Other Ambulatory Visit: Payer: Self-pay

## 2022-12-05 NOTE — Progress Notes (Signed)
Patient approved copay and wants to use AR.

## 2022-12-06 ENCOUNTER — Ambulatory Visit: Payer: PPO | Admitting: Dermatology

## 2022-12-08 ENCOUNTER — Encounter: Payer: Self-pay | Admitting: Dermatology

## 2022-12-08 ENCOUNTER — Ambulatory Visit: Payer: PPO | Admitting: Dermatology

## 2022-12-08 DIAGNOSIS — C44722 Squamous cell carcinoma of skin of right lower limb, including hip: Secondary | ICD-10-CM | POA: Diagnosis not present

## 2022-12-08 DIAGNOSIS — D492 Neoplasm of unspecified behavior of bone, soft tissue, and skin: Secondary | ICD-10-CM

## 2022-12-08 DIAGNOSIS — C4492 Squamous cell carcinoma of skin, unspecified: Secondary | ICD-10-CM

## 2022-12-08 HISTORY — DX: Squamous cell carcinoma of skin, unspecified: C44.92

## 2022-12-08 NOTE — Patient Instructions (Addendum)

## 2022-12-08 NOTE — Progress Notes (Signed)
   New Patient Visit   Subjective  Amy Lin is a 89 y.o. female who presents for the following: check spot R leg, 6 wks, tender to touch, pt's PCP prescribed  Keflex 500mg  tid x 7 days 10/21/22, and [facility] 0.5% cr bid The patient has spots, moles and lesions to be evaluated, some may be new or changing and the patient may have concern these could be cancer.   The following portions of the chart were reviewed this encounter and updated as appropriate: medications, allergies, medical history  Review of Systems:  No other skin or systemic complaints except as noted in HPI or Assessment and Plan.  Objective  Well appearing patient in no apparent distress; mood and affect are within normal limits.   A focused examination was performed of the following areas: Right lower leg  Relevant exam findings are noted in the Assessment and Plan.  R pretibial Keratotic violaceous plaque 14.44mm         Assessment & Plan     Neoplasm of skin R pretibial  Skin / nail biopsy Type of biopsy: tangential   Informed consent: discussed and consent obtained   Timeout: patient name, date of birth, surgical site, and procedure verified   Procedure prep:  Patient was prepped and draped in usual sterile fashion Prep type:  Isopropyl alcohol Anesthesia: the lesion was anesthetized in a standard fashion   Anesthetic:  1% lidocaine w/ epinephrine 1-100,000 buffered w/ 8.4% NaHCO3 Instrument used: DermaBlade   Hemostasis achieved with: pressure and aluminum chloride   Hemostasis achieved with comment:  Electrocautery Outcome: patient tolerated procedure well   Post-procedure details: sterile dressing applied and wound care instructions given   Dressing type: petrolatum and bacitracin    Specimen 1 - Surgical pathology Differential Diagnosis: D48.5 R/O SCC  Check Margins: No Keratotic violaceous plaque 14.64mm 2 pieces    Return for prn pending bx results.  I, Ardis Rowan, RMA, am acting  as scribe for Elie Goody, MD .   Documentation: I have reviewed the above documentation for accuracy and completeness, and I agree with the above.  Elie Goody, MD

## 2022-12-09 ENCOUNTER — Ambulatory Visit (INDEPENDENT_AMBULATORY_CARE_PROVIDER_SITE_OTHER): Payer: PPO

## 2022-12-09 DIAGNOSIS — E538 Deficiency of other specified B group vitamins: Secondary | ICD-10-CM | POA: Diagnosis not present

## 2022-12-09 MED ORDER — CYANOCOBALAMIN 1000 MCG/ML IJ SOLN
1000.0000 ug | Freq: Once | INTRAMUSCULAR | Status: AC
  Administered 2022-12-09: 1000 ug via INTRAMUSCULAR

## 2022-12-09 NOTE — Progress Notes (Signed)
Per orders of [name], NP , injection of vitamin b 12 given by Lewanda Rife in left deltoid. Patient tolerated injection well. Patient will make appointment for 1 week.  This was pts first vitamin b 12 shot and pt waited 15 mins with no problems or concerns.

## 2022-12-13 LAB — SURGICAL PATHOLOGY

## 2022-12-14 ENCOUNTER — Telehealth: Payer: Self-pay

## 2022-12-14 ENCOUNTER — Encounter: Payer: Self-pay | Admitting: Nurse Practitioner

## 2022-12-14 ENCOUNTER — Other Ambulatory Visit (HOSPITAL_COMMUNITY): Payer: Self-pay

## 2022-12-14 DIAGNOSIS — C4492 Squamous cell carcinoma of skin, unspecified: Secondary | ICD-10-CM

## 2022-12-14 NOTE — Telephone Encounter (Signed)
Discussed pathology results and treatment options. Patient voiced understanding. Patient prefers [place] due to location. Referral sent to [facility], Dr. Jeannine Boga.

## 2022-12-14 NOTE — Telephone Encounter (Signed)
-----   Message from Home sent at 12/13/2022  5:51 PM EST ----- Diagnosis: R pretibal :       WELL DIFFERENTIATED SQUAMOUS CELL CARCINOMA    Please call with diagnosis and determine where the patient would like to have Mohs surgery.  Explanation: This is a squamous cell skin cancer that has grown beyond the surface of the skin and is invading the second layer of the skin. It has the potential to spread beyond the skin and threaten your health, so I recommend treating it.  Treatment: Given the location and type of skin cancer, I recommend Mohs surgery. Mohs surgery involves cutting out the skin cancer and then checking under the microscope to ensure the whole skin cancer was removed. If any skin cancer remains, the surgeon will cut out more until it is fully removed. The cure rate is about 98-99%. Once the Mohs surgeon confirms the skin cancer is out, they will discuss the options to repair or heal the area. You must take it easy for about two weeks after surgery (no lifting over 10-15 lbs, avoid activity to get your heart rate and blood pressure up). It is done at another office outside of [place] ([place], [place], or [place]).  If the patient asks for my recommendation for Mohs surgeon, I recommend Dr Caprice Beaver or Dr Coralie Carpen at [facility] if the patient is able to make the trip.

## 2022-12-15 NOTE — Telephone Encounter (Signed)
I see where one of the techs had documented about the patients prolia and such. I am not sure what she is talking about. Can you shed any light on the issue?

## 2022-12-16 ENCOUNTER — Ambulatory Visit (INDEPENDENT_AMBULATORY_CARE_PROVIDER_SITE_OTHER): Payer: PPO

## 2022-12-16 DIAGNOSIS — E538 Deficiency of other specified B group vitamins: Secondary | ICD-10-CM

## 2022-12-16 MED ORDER — CYANOCOBALAMIN 1000 MCG/ML IJ SOLN
1000.0000 ug | Freq: Once | INTRAMUSCULAR | Status: AC
  Administered 2022-12-16: 1000 ug via INTRAMUSCULAR

## 2022-12-16 NOTE — Progress Notes (Signed)
Patient presented for B 12 injection given by Makinzy Cleere, CMA to right deltoid, patient voiced no concerns nor showed any signs of distress during injection.  

## 2022-12-19 ENCOUNTER — Telehealth: Payer: Self-pay

## 2022-12-19 NOTE — Telephone Encounter (Signed)
Patient called checking on referral placed last week. I advised patient that it can take a few weeks to hear from [facility] because they receive referrals from other offices, and the doctor has to review and approve the surgery, but if she doesn't hear from them after two weeks to reach back out and let us know. Pt states that she is having pain at site that kept her up last night. She states no redness or drainage, and site not hurting to today. I told her I would like you know and contact her if you had any recommendations for pain at night.

## 2022-12-20 MED ORDER — MUPIROCIN 2 % EX OINT: 1.0000 | TOPICAL_OINTMENT | Freq: Two times a day (BID) | CUTANEOUS | 0 refills | Status: DC

## 2022-12-20 NOTE — Addendum Note (Signed)
Addended by: Cari Caraway A on: 12/20/2022 12:35 PM   Modules accepted: Orders

## 2022-12-20 NOTE — Telephone Encounter (Signed)
Discussed with patient and sent in to pharmacy. Pt states that she didn't have pain last night like she did the night before.

## 2022-12-23 ENCOUNTER — Ambulatory Visit (INDEPENDENT_AMBULATORY_CARE_PROVIDER_SITE_OTHER): Payer: PPO

## 2022-12-23 DIAGNOSIS — E538 Deficiency of other specified B group vitamins: Secondary | ICD-10-CM

## 2022-12-23 MED ORDER — CYANOCOBALAMIN 1000 MCG/ML IJ SOLN
1000.0000 ug | Freq: Once | INTRAMUSCULAR | Status: AC
  Administered 2022-12-23: 1000 ug via INTRAMUSCULAR

## 2022-12-23 NOTE — Progress Notes (Signed)
Per orders of [name], NP , injection of vitamin b 12 injection given by Lewanda Rife in left deltoid. Patient tolerated injection well. Patient will make appointment for 1 week.

## 2022-12-26 ENCOUNTER — Ambulatory Visit (INDEPENDENT_AMBULATORY_CARE_PROVIDER_SITE_OTHER): Payer: PPO

## 2022-12-26 VITALS — Ht 64.0 in | Wt 144.0 lb

## 2022-12-26 DIAGNOSIS — Z23 Encounter for immunization: Secondary | ICD-10-CM | POA: Diagnosis not present

## 2022-12-26 DIAGNOSIS — Z0000 Encounter for general adult medical examination without abnormal findings: Secondary | ICD-10-CM | POA: Diagnosis not present

## 2022-12-26 NOTE — Patient Instructions (Signed)
Amy Lin , Thank you for taking time to come for your Medicare Wellness Visit. I appreciate your ongoing commitment to your health goals. Please review the following plan we discussed and let me know if I can assist you in the future.   Referrals/Orders/Follow-Ups/Clinician Recommendations: none  This is a list of the screening recommended for you and due dates:  Health Maintenance  Topic Date Due   Zoster (Shingles) Vaccine (1 of 2) 03/28/2023*   Flu Shot  05/01/2023*   COVID-19 Vaccine (4 - 2023-24 season) 09/30/2023*   DTaP/Tdap/Td vaccine (2 - Tdap) 12/26/2023*   Medicare Annual Wellness Visit  12/26/2023   Pneumonia Vaccine  Completed   DEXA scan (bone density measurement)  Completed   HPV Vaccine  Aged Out  *Topic was postponed. The date shown is not the original due date.    Advanced directives: (Declined) Advance directive discussed with you today. Even though you declined this today, please call our office should you change your mind, and we can give you the proper paperwork for you to fill out.  Next Medicare Annual Wellness Visit scheduled for next year: Yes 12/27/23 @ 10:50 am  telephone

## 2022-12-26 NOTE — Progress Notes (Signed)
Subjective:   Amy Lin is a 89 y.o. female who presents for Medicare Annual (Subsequent) preventive examination.  Visit Complete: Virtual I connected with  Amy Lin on 12/26/22 by a audio enabled telemedicine application and verified that I am speaking with the correct person using two identifiers.  Patient Location: Home  Provider Location: Home Office  I discussed the limitations of evaluation and management by telemedicine. The patient expressed understanding and agreed to proceed.  Vital Signs: Because this visit was a virtual/telehealth visit, some criteria may be missing or patient reported. Any vitals not documented were not able to be obtained and vitals that have been documented are patient reported.  Patient Medicare AWV questionnaire was completed by the patient on (not done); I have confirmed that all information answered by patient is correct and no changes since this date. Cardiac Risk Factors include: advanced age (>69men, >11 women);dyslipidemia;hypertension    Objective:    Today's Vitals   12/26/22 1043  Weight: 144 lb (65.3 kg)  Height: 5\' 4"  (1.626 m)   Body mass index is 24.72 kg/m.     12/26/2022   10:58 AM 12/01/2021    8:56 AM 03/20/2020    1:34 PM 03/05/2020    3:04 PM 11/15/2019    7:04 PM 04/14/2015   12:05 PM 05/07/2014   12:03 PM  Advanced Directives  Does Patient Have a Medical Advance Directive? No No No No No No No  Would patient like information on creating a medical advance directive?  No - Patient declined No - Patient declined No - Patient declined No - Patient declined Yes - Educational materials given No - patient declined information    Current Medications (verified) Outpatient Encounter Medications as of 12/26/2022  Medication Sig   aspirin 81 MG EC tablet Take 81 mg by mouth daily.   Cholecalciferol (VITAMIN D3) 2000 UNITS TABS Take 1 tablet by mouth every evening.   denosumab (PROLIA) 60 MG/ML SOSY injection Inject 60 mg into  the skin every 6 (six) months.   ezetimibe (ZETIA) 10 MG tablet Take 1 tablet (10 mg total) by mouth daily.   fluticasone (FLONASE) 50 MCG/ACT nasal spray Place 1 spray into both nostrils daily.   furosemide (LASIX) 20 MG tablet Take 1 tablet (20 mg total) by mouth daily as needed for fluid or edema.   levocetirizine (XYZAL) 5 MG tablet Take 5 mg by mouth every evening.   levothyroxine (SYNTHROID) 75 MCG tablet TAKE ONE TABLET BY MOUTH ONCE A DAY. TAKE ON AN EMPTY STOMACH WITH A GLASS OF WATER ATLEAST 30-60 MIN BEFORE BREAKFAST   lisinopril (ZESTRIL) 5 MG tablet TAKE ONE TABLET BY MOUTH ONCE A DAY   mupirocin ointment (BACTROBAN) 2 % Apply 1 Application topically 2 (two) times daily.   pantoprazole (PROTONIX) 20 MG tablet TAKE ONE TABLET BY MOUTH TWICE A DAY   potassium chloride (KLOR-CON M) 10 MEQ tablet Take 1 tablet (10 mEq total) by mouth daily.   rosuvastatin (CRESTOR) 20 MG tablet TAKE ONE TABLET BY MOUTH ONCE A DAY   triamcinolone cream (KENALOG) 0.5 % Apply 1 Application topically 2 (two) times daily.   nitroGLYCERIN (NITROSTAT) 0.4 MG SL tablet DISSOLVE 1 TABLET UNDER TONGUE AS NEEDEDFOR CHEST PAIN. MAY REPEAT 5 MINUTES APART 3 TIMES IF NEEDED (Patient not taking: Reported on 11/30/2022)   ondansetron (ZOFRAN-ODT) 4 MG disintegrating tablet Take 1 tablet (4 mg total) by mouth every 8 (eight) hours as needed for nausea or vomiting. (Patient not  taking: Reported on 12/26/2022)   No facility-administered encounter medications on file as of 12/26/2022.    Allergies (verified) Patient has no known allergies.   History: Past Medical History:  Diagnosis Date   Allergy    Coronary artery disease    GERD (gastroesophageal reflux disease)    Hyperlipidemia    Hypertension    diuretic for edema at first   Hypothyroidism    Myocardial infarction [facility])    Osteoarthritis, multiple sites    Osteoporosis    Squamous cell carcinoma of skin 12/08/2022   Right pretibial. WD SCC. Mohs  pending.   Past Surgical History:  Procedure Laterality Date   ABDOMINAL HYSTERECTOMY     BACK SURGERY  1999   Discectomy and Ray cage   BLADDER REPAIR  ~2010   CARDIAC CATHETERIZATION  03/05/2020   CARPAL TUNNEL RELEASE  8/15   COLONOSCOPY W/ BIOPSIES N/A 2016   CORONARY STENT INTERVENTION N/A 03/06/2020   Procedure: CORONARY STENT INTERVENTION;  Surgeon: Yates Decamp, MD;  Location: [facility] INVASIVE CV LAB;  Service: Cardiovascular;  Laterality: N/A;   CORONARY THROMBECTOMY N/A 03/05/2020   Procedure: Coronary Thrombectomy;  Surgeon: Yates Decamp, MD;  Location: [facility] INVASIVE CV LAB;  Service: Cardiovascular;  Laterality: N/A;   CORONARY ULTRASOUND/IVUS N/A 03/06/2020   Procedure: Intravascular Ultrasound/IVUS;  Surgeon: Yates Decamp, MD;  Location: [facility] INVASIVE CV LAB;  Service: Cardiovascular;  Laterality: N/A;   CORONARY/GRAFT ACUTE MI REVASCULARIZATION N/A 03/05/2020   Procedure: Coronary/Graft Acute MI Revascularization;  Surgeon: Yates Decamp, MD;  Location[facility] INVAS[facility] CV LAB;  Service: Cardiovascular;  Laterality: N/A;   LEFT HEART CATH AND CORONARY ANGIOGRAPHY N/A 03/05/2020   Procedure: LEFT HEART CATH AND CORONARY ANGIOGRAPHY;  Surgeon: Yates Decamp, MD;  Location: [facility] INVASIVE CV LAB;  Service: Cardiovascular;  Laterality: N/A;   MENISCECTOMY Left 2/15   Dr Ranell Patrick   REFRACTIVE SURGERY Bilateral 07/2019   UPPER GI ENDOSCOPY N/A Feb 2017   Family History  Problem Relation Age of Onset   Heart disease Mother    Stroke Father    Hypertension Father    Heart disease Father    Diabetes Father    Breast cancer Neg Hx    Social History   Socioeconomic History   Marital status: Divorced    Spouse name: Not on file   Number of children: 3   Years of education: Not on file   Highest education level: Some college, no degree  Occupational History   Occupation: Producer, television/film/video    Comment: [employer]  Tobacco Use   Smoking status: Never   Smokeless tobacco: Never  Vaping Use   Vaping status:  Never Used  Substance and Sexual Activity   Alcohol use: No   Drug use: No   Sexual activity: Never  Other Topics Concern   Not on file  Social History Narrative   3 children--- 1 died   Lives alone      No living will   Requests son and daughter as health care POAs   Would accept resuscitation attempts but no prolonged artificial ventilation   Not sure about tube feeds   Social Determinants of Health   Financial Resource Strain: Low Risk  (12/26/2022)   Overall Financial Resource Strain (CARDIA)    Difficulty of Paying Living Expenses: Not hard at all  Food Insecurity: No Food Insecurity (12/26/2022)   Hunger Vital Sign    Worried About Running Out of Food in the Last Year: Never true  Ran Out of Food in the Last Year: Never true  Transportation Needs: No Transportation Needs (12/26/2022)   PRAPARE - Administrator, Civil Service (Medical): No    Lack of Transportation (Non-Medical): No  Physical Activity: Insufficiently Active (12/26/2022)   Exercise Vital Sign    Days of Exercise per Week: 3 days    Minutes of Exercise per Session: 30 min  Stress: No Stress Concern Present (12/26/2022)   [employer] of Occupational Health - Occupational Stress Questionnaire    Feeling of Stress : Not at all  Social Connections: Moderately Integrated (12/26/2022)   Social Connection and Isolation Panel [NHANES]    Frequency of Communication with Friends and Family: More than three times a week    Frequency of Social Gatherings with Friends and Family: More than three times a week    Attends Religious Services: More than 4 times per year    Active Member of [employer] or Organizations: Yes    Attends Engineer, structural: More than 4 times per year    Marital Status: Divorced    Tobacco Counseling Counseling given: Not Answered   Clinical Intake:  Pre-visit preparation completed: Yes  Pain : No/denies pain     BMI - recorded: 24.72 Nutritional Status:  BMI of 19-24  Normal Nutritional Risks: None Diabetes: No  How often do you need to have someone help you when you read instructions, pamphlets, or other written materials from your doctor or pharmacy?: 1 - Never  Interpreter Needed?: No  Comments: lives alone Information entered by :: B.Dashan Chizmar,LPN   Activities of Daily Living    12/26/2022   10:58 AM  In your present state of health, do you have any difficulty performing the following activities:  Hearing? 1  Vision? 0  Difficulty concentrating or making decisions? 0  Walking or climbing stairs? 0  Dressing or bathing? 0  Doing errands, shopping? 0  Preparing Food and eating ? N  Using the Toilet? N  In the past six months, have you accidently leaked urine? N  Do you have problems with loss of bowel control? N  Managing your Medications? N  Managing your Finances? N  Housekeeping or managing your Housekeeping? N    Patient Care Team: Eden Emms, NP as PCP - General (Pain Medicine) Kathyrn Sheriff, [facility] (Inactive) as Pharmacist (Pharmacist) Sinda Du, MD as Consulting Physician (Ophthalmology)  Indicate any recent Medical Services you may have received from other than [facility] providers in the past year (date may be approximate).     Assessment:   This is a routine wellness examination for [place].  Hearing/Vision screen Hearing Screening - Comments:: Pt says hearing decreased some;does not need hearing test right now Vision Screening - Comments:: Pt wears contacts and vision good;[facility] Dr Jake Samples   Goals Addressed               This Visit's Progress     Increase physical activity   Not on track     When weather permits, I will walk outside 30 min daily. Otherwise, I will walk on my treadmill for 30 min.       COMPLETED: Manage My Medicine   On track     Timeframe:  Long-Range Goal Priority:  Medium Start Date:     03/18/21                        Expected End Date: 03/18/22  Follow Up Date August 2023   - call for medicine refill 2 or 3 days before it runs out - call if I am sick and can't take my medicine - keep a list of all the medicines I take; vitamins and herbals too - use a pillbox to sort medicine    Why is this important?   These steps will help you keep on track with your medicines.   Notes:       COMPLETED: Stay healthy (pt-stated)   On track      Depression Screen    12/26/2022   10:53 AM 05/31/2022    8:55 AM 04/13/2022   11:23 AM 12/01/2021    8:52 AM 11/30/2020   10:04 AM 06/02/2020   11:47 AM 04/28/2020   11:23 AM  PHQ 2/9 Scores  PHQ - 2 Score 0 0 0 0 0 0 1  PHQ- 9 Score  4 1   2 5     Fall Risk    12/26/2022   10:49 AM 05/31/2022    8:55 AM 04/13/2022   10:49 AM 12/01/2021    8:55 AM 03/20/2020    1:04 PM  Fall Risk   Falls in the past year? 0 0 0 0 0  Number falls in past yr: 0 0 0 0   Injury with Fall? 0 0 0 0   Risk for fall due to : No Fall Risks No Fall Risks No Fall Risks No Fall Risks   Follow up Falls prevention discussed;Education provided Falls evaluation completed  Falls prevention discussed     MEDICARE RISK AT HOME: Medicare Risk at Home Any stairs in or around the home?: Yes If so, are there any without handrails?: Yes Home free of loose throw rugs in walkways, pet beds, electrical cords, etc?: Yes Adequate lighting in your home to reduce risk of falls?: Yes Life alert?: No Use of a cane, walker or w/c?: No Grab bars in the bathroom?: Yes Shower chair or bench in shower?: Yes Elevated toilet seat or a handicapped toilet?: No  TIMED UP AND GO:  Was the test performed?  No    Cognitive Function:    04/14/2015   12:07 PM  MMSE - Mini Mental State Exam  Orientation to time 5  Orientation to Place 5  Registration 3  Attention/ Calculation 5  Recall 3  Language- name 2 objects 0  Language- repeat 1  Language- follow 3 step command 3  Language- read & follow direction 1  Write a sentence 0   Copy design 0  Total score 26        12/26/2022   11:06 AM 12/01/2021    8:56 AM  6CIT Screen  What Year? 0 points 0 points  What month? 0 points 0 points  What time? 0 points 0 points  Count back from 20 0 points 0 points  Months in reverse 0 points 0 points  Repeat phrase 0 points 0 points  Total Score 0 points 0 points    Immunizations Immunization History  Administered Date(s) Administered   Fluad Quad(high Dose 65+) 11/04/2019   Influenza Split 11/01/2010, 10/31/2011   Influenza Whole 01/31/2005, 11/01/2007, 12/04/2009   Influenza, High Dose Seasonal PF 11/15/2013, 10/24/2017, 10/12/2018, 11/04/2020   Influenza,inj,Quad PF,6+ Mos 10/16/2012, 11/06/2014, 11/11/2015, 11/01/2016, 10/24/2017   Influenza-Unspecified 11/09/2016   PFIZER(Purple Top)SARS-COV-2 Vaccination 02/17/2019, 03/10/2019, 02/11/2020   Pneumococcal Conjugate-13 05/07/2014   Pneumococcal Polysaccharide-23 01/31/2006   Td 01/31/2006   Zoster, Live 08/08/2013  TDAP status: Up to date  Flu Vaccine status: Due, Education has been provided regarding the importance of this vaccine. Advised may receive this vaccine at local pharmacy or Health Dept. Aware to provide a copy of the vaccination record if obtained from local pharmacy or Health Dept. Verbalized acceptance and understanding.  Pneumococcal vaccine status: Up to date  Covid-19 vaccine status: Completed vaccines  Qualifies for Shingles Vaccine? Yes   Zostavax completed No   Shingrix Completed?: No.    Education has been provided regarding the importance of this vaccine. Patient has been advised to call insurance company to determine out of pocket expense if they have not yet received this vaccine. Advised may also receive vaccine at local pharmacy or Health Dept. Verbalized acceptance and understanding.  Screening Tests Health Maintenance  Topic Date Due   Zoster Vaccines- Shingrix (1 of 2) 03/28/2023 (Originally 01/31/1953)   INFLUENZA VACCINE   05/01/2023 (Originally 09/01/2022)   COVID-19 Vaccine (4 - 2023-24 season) 09/30/2023 (Originally 10/02/2022)   DTaP/Tdap/Td (2 - Tdap) 12/26/2023 (Originally 02/01/2016)   Medicare Annual Wellness (AWV)  12/26/2023   Pneumonia Vaccine 41+ Years old  Completed   DEXA SCAN  Completed   HPV VACCINES  Aged Out    Health Maintenance  There are no preventive care reminders to display for this patient.   Colorectal cancer screening: No longer required.   Mammogram status: No longer required due to age.  Lung Cancer Screening: (Low Dose CT Chest recommended if Age 2-80 years, 20 pack-year currently smoking OR have quit w/in 15years.) does not qualify.   Lung Cancer Screening Referral: no  Additional Screening:  Hepatitis C Screening: does not qualify; Completed no  Vision Screening: Recommended annual ophthalmology exams for early detection of glaucoma and other disorders of the eye. Is the patient up to date with their annual eye exam?  Yes  Who is the provider or what is the name of the office in which the patient attends annual eye exams? Dr Jake Samples If pt is not established with a provider, would they like to be referred to a provider to establish care? No .   Dental Screening: Recommended annual dental exams for proper oral hygiene  Diabetic Foot Exam: n/a  Community Resource Referral / Chronic Care Management: CRR required this visit?  No   CCM required this visit?  No    Plan:     I have personally reviewed and noted the following in the patient's chart:   Medical and social history Use of alcohol, tobacco or illicit drugs  Current medications and supplements including opioid prescriptions. Patient is not currently taking opioid prescriptions. Functional ability and status Nutritional status Physical activity Advanced directives List of other physicians Hospitalizations, surgeries, and ER visits in previous 12 months Vitals Screenings to include cognitive, depression,  and falls Referrals and appointments  In addition, I have reviewed and discussed with patient certain preventive protocols, quality metrics, and best practice recommendations. A written personalized care plan for preventive services as well as general preventive health recommendations were provided to patient.    Sue Lush, LPN   [id number]   After Visit Summary: (Mail) Due to this being a telephonic visit, the after visit summary with patients personalized plan was offered to patient via mail   Nurse Notes: The patient states she is doing well and has no concerns or questions at this time. She does relay she is anticipating a call regarding a spot on her rt leg from specialist. She  relays she will call today to try to make appt.

## 2022-12-27 ENCOUNTER — Other Ambulatory Visit: Payer: Self-pay | Admitting: Family Medicine

## 2022-12-27 ENCOUNTER — Other Ambulatory Visit: Payer: Self-pay | Admitting: Internal Medicine

## 2022-12-27 ENCOUNTER — Other Ambulatory Visit: Payer: Self-pay | Admitting: Nurse Practitioner

## 2022-12-27 DIAGNOSIS — R0982 Postnasal drip: Secondary | ICD-10-CM

## 2023-01-02 ENCOUNTER — Encounter: Payer: Self-pay | Admitting: Nurse Practitioner

## 2023-01-02 NOTE — Telephone Encounter (Signed)
Can we see about the patients prolia shot?

## 2023-01-03 ENCOUNTER — Telehealth: Payer: Self-pay

## 2023-01-03 ENCOUNTER — Ambulatory Visit (INDEPENDENT_AMBULATORY_CARE_PROVIDER_SITE_OTHER): Payer: PPO

## 2023-01-03 DIAGNOSIS — E538 Deficiency of other specified B group vitamins: Secondary | ICD-10-CM

## 2023-01-03 MED ORDER — CYANOCOBALAMIN 1000 MCG/ML IJ SOLN
1000.0000 ug | Freq: Once | INTRAMUSCULAR | Status: AC
  Administered 2023-01-03: 1000 ug via INTRAMUSCULAR

## 2023-01-03 NOTE — Telephone Encounter (Signed)
Can we check to see if benefits have been ran?

## 2023-01-03 NOTE — Telephone Encounter (Signed)
 Prolia VOB initiated via [email or web address]  Next Prolia inj DUE: NOW

## 2023-01-03 NOTE — Progress Notes (Signed)
 Per orders of [name], NP , injection of vitamin b 12 given by Lewanda Rife in right deltoid. Patient tolerated injection well. Patient will make appointment for 2 week.

## 2023-01-04 ENCOUNTER — Telehealth: Payer: Self-pay | Admitting: Cardiology

## 2023-01-04 DIAGNOSIS — I25110 Atherosclerotic heart disease of native coronary artery with unstable angina pectoris: Secondary | ICD-10-CM

## 2023-01-04 MED ORDER — EZETIMIBE 10 MG PO TABS: 10.0000 mg | ORAL_TABLET | Freq: Every day | ORAL | 2 refills | Status: DC

## 2023-01-04 MED ORDER — NITROGLYCERIN 0.4 MG SL SUBL: 0.4000 mg | SUBLINGUAL_TABLET | SUBLINGUAL | 2 refills | Status: DC | PRN

## 2023-01-04 NOTE — Telephone Encounter (Signed)
 RX sent to requested Pharmacy

## 2023-01-04 NOTE — Telephone Encounter (Signed)
*  STAT* If patient is at the pharmacy, call can be transferred to refill team.   1. Which medications need to be refilled? (please list name of each medication and dose if known)   ezetimibe (ZETIA) 10 MG tablet  nitroGLYCERIN (NITROSTAT) 0.4 MG SL tablet   2. Which pharmacy/location (including street and city if local pharmacy) is medication to be sent to? [facility] - [place], [place] - [address]    3. Do they need a 30 day or 90 day supply? 90 day

## 2023-01-09 ENCOUNTER — Other Ambulatory Visit (HOSPITAL_COMMUNITY): Payer: Self-pay

## 2023-01-09 NOTE — Telephone Encounter (Signed)
Pt ready for scheduling for PROLIA on or after : 01/09/23  Out-of-pocket cost due at time of visit: $340  Primary: HEALTH TEAM ADVANTAGE Prolia co-insurance: 20% Admin fee co-insurance: $20  Secondary: --- Prolia co-insurance:  Admin fee co-insurance:   Medical Benefit Details: Date Benefits were checked: 01/03/23 Deductible: NO/ Coinsurance: 20%/ Admin Fee: $20  Prior Auth: N/A PA# Expiration Date:   # of doses approved:  Pharmacy benefit: Copay $--- (REFILL TOO SOON. Last filled 12/19/22, Next fill 05/03/23) If patient wants fill through the pharmacy benefit please send prescription to: [facility], and include estimated need by date in rx notes. Pharmacy will ship medication directly to the office.  Patient not eligible for Prolia Copay Card. Copay Card can make patient's cost as little as $25. Link to apply: [email or web address]://[email or web address].[email or web address]  ** This summary of benefits is an estimation of the patient's out-of-pocket cost. Exact cost may very based on individual plan coverage.

## 2023-01-17 ENCOUNTER — Ambulatory Visit: Payer: PPO

## 2023-01-18 ENCOUNTER — Ambulatory Visit: Payer: PPO

## 2023-01-18 ENCOUNTER — Other Ambulatory Visit: Payer: Self-pay

## 2023-01-18 ENCOUNTER — Ambulatory Visit: Payer: PPO | Admitting: Dermatology

## 2023-01-18 NOTE — Telephone Encounter (Signed)
Sent my chart to let patient know where we are at in process.

## 2023-01-19 ENCOUNTER — Other Ambulatory Visit: Payer: Self-pay

## 2023-01-19 ENCOUNTER — Other Ambulatory Visit (HOSPITAL_COMMUNITY): Payer: Self-pay

## 2023-01-19 NOTE — Progress Notes (Signed)
Clinical Intervention Note  Clinical Intervention Notes: Patient reports difficultly getting Prolia Injection at [facility]. Pharmacy sent Prolia Injection to Clinic on 12/19/22. Clinic has not made appoitment for Prolia. Patient calls pharmacy after recieving Prolia bill and states she has not recieved the injection. Reached out to CMA Barnie Mort who reports that she is in not in the office today but does not recall the Prolia delivery. Joellen will look for the Prolia tomorrow, 12/19 and follow-up with pharmacy/patient about the Prolia. Patient is aware she will receive a phone call from either the clinic or the pharmacy tomorrow. Patient states that either number in profile are good contact numbers. Patient was very appreciative and expressed understanding.   Clinical Intervention Outcomes: Improved therapy adherence   Bobette Mo Specialty Pharmacist

## 2023-01-20 ENCOUNTER — Other Ambulatory Visit: Payer: Self-pay

## 2023-01-20 NOTE — Progress Notes (Signed)
Sent a follow up message to [facility], have not received a response today.

## 2023-01-20 NOTE — Progress Notes (Signed)
Sent a follow up email to Alinda [facility]) for tracking information for Prolia sent to [facility] on 12/20/22.

## 2023-01-23 ENCOUNTER — Encounter: Payer: Self-pay | Admitting: Internal Medicine

## 2023-01-23 ENCOUNTER — Ambulatory Visit: Payer: PPO | Admitting: Internal Medicine

## 2023-01-23 VITALS — BP 118/70 | HR 82 | Temp 98.4°F | Ht 63.0 in | Wt 140.0 lb

## 2023-01-23 DIAGNOSIS — J0111 Acute recurrent frontal sinusitis: Secondary | ICD-10-CM | POA: Diagnosis not present

## 2023-01-23 DIAGNOSIS — E538 Deficiency of other specified B group vitamins: Secondary | ICD-10-CM | POA: Diagnosis not present

## 2023-01-23 MED ORDER — CYANOCOBALAMIN 1000 MCG/ML IJ SOLN
1000.0000 ug | Freq: Once | INTRAMUSCULAR | Status: AC
  Administered 2023-01-23: 1000 ug via INTRAMUSCULAR

## 2023-01-23 MED ORDER — AMOXICILLIN-POT CLAVULANATE 875-125 MG PO TABS
1.0000 | ORAL_TABLET | Freq: Two times a day (BID) | ORAL | 0 refills | Status: DC
Start: 2023-01-23 — End: 2023-03-20

## 2023-01-23 MED ORDER — BENZONATATE 200 MG PO CAPS: 200.0000 mg | ORAL_CAPSULE | Freq: Three times a day (TID) | ORAL | 0 refills | Status: DC | PRN

## 2023-01-23 NOTE — Assessment & Plan Note (Signed)
Will give injection today--cancel tomorrow

## 2023-01-23 NOTE — Progress Notes (Signed)
Subjective:    Patient ID: Amy Lin, female    DOB: 28-Sep-1933, 89 y.o.   MRN: [id number]  HPI Here due to GI --and then respiratory symptoms  Started a week ago---woke with vomiting and diarrhea Lasted that whole day Having chills and fever Finally improved later that day--but then started with runny nose A few days later--started with congestion, drainage and lots of cough with mucus Clear from nose---yellow sputum though---worse in AM Pain with cough Using coricidin --hasn't really helped  Fever never more than low grade since first day No SOB Some right ear pain---but has had past problems there Dull frontal headache--"like it is encased in something"  Did take some extra strength tylenol  Current Outpatient Medications on File Prior to Visit  Medication Sig Dispense Refill   aspirin 81 MG EC tablet Take 81 mg by mouth daily.     Cholecalciferol (VITAMIN D3) 2000 UNITS TABS Take 1 tablet by mouth every evening.     denosumab (PROLIA) 60 MG/ML SOSY injection Inject 60 mg into the skin every 6 (six) months.     ezetimibe (ZETIA) 10 MG tablet Take 1 tablet (10 mg total) by mouth daily. 90 tablet 2   fluticasone (FLONASE) 50 MCG/ACT nasal spray PLACE ONE SPRAY INTO BOTH NOSTRILS DAILY 16 mL 2   furosemide (LASIX) 20 MG tablet Take 1 tablet (20 mg total) by mouth daily as needed for fluid or edema. 90 tablet 3   hydrocortisone 2.5 % lotion APPLY TOPICALLY TWO TIMES DAILY 59 mL 0   levocetirizine (XYZAL) 5 MG tablet Take 5 mg by mouth every evening.     levothyroxine (SYNTHROID) 75 MCG tablet TAKE ONE TABLET BY MOUTH ONCE A DAY. TAKE ON AN EMPTY STOMACH WITH A GLASS OF WATER ATLEAST 30-60 MIN BEFORE BREAKFAST 90 tablet 1   lisinopril (ZESTRIL) 5 MG tablet TAKE ONE TABLET BY MOUTH ONCE A DAY 90 tablet PRN   mupirocin ointment (BACTROBAN) 2 % Apply 1 Application topically 2 (two) times daily. 22 g 0   nitroGLYCERIN (NITROSTAT) 0.4 MG SL tablet Place 1 tablet (0.4 mg total)  under the tongue every 5 (five) minutes as needed for chest pain. 75 tablet 2   ondansetron (ZOFRAN-ODT) 4 MG disintegrating tablet Take 1 tablet (4 mg total) by mouth every 8 (eight) hours as needed for nausea or vomiting. 20 tablet 0   pantoprazole (PROTONIX) 20 MG tablet TAKE ONE TABLET BY MOUTH TWICE A DAY 180 tablet 2   potassium chloride (KLOR-CON M) 10 MEQ tablet Take 1 tablet (10 mEq total) by mouth daily. 90 tablet 3   rosuvastatin (CRESTOR) 20 MG tablet TAKE ONE TABLET BY MOUTH ONCE A DAY 90 tablet PRN   triamcinolone cream (KENALOG) 0.5 % Apply 1 Application topically 2 (two) times daily. 30 g 0   No current facility-administered medications on file prior to visit.    No Known Allergies  Past Medical History:  Diagnosis Date   Allergy    Coronary artery disease    GERD (gastroesophageal reflux disease)    Hyperlipidemia    Hypertension    diuretic for edema at first   Hypothyroidism    Myocardial infarction [facility])    Osteoarthritis, multiple sites    Osteoporosis    Squamous cell carcinoma of skin 12/08/2022   Right pretibial. WD SCC. Mohs pending.    Past Surgical History:  Procedure Laterality Date   ABDOMINAL HYSTERECTOMY     BACK SURGERY  1999  Discectomy and Ray cage   BLADDER REPAIR  ~2010   CARDIAC CATHETERIZATION  03/05/2020   CARPAL TUNNEL RELEASE  8/15   COLONOSCOPY W/ BIOPSIES N/A 2016   CORONARY STENT INTERVENTION N/A 03/06/2020   Procedure: CORONARY STENT INTERVENTION;  Surgeon: Yates Decamp, MD;  Location: [facility] INVASIVE CV LAB;  Service: Cardiovascular;  Laterality: N/A;   CORONARY THROMBECTOMY N/A 03/05/2020   Procedure: Coronary Thrombectomy;  Surgeon: Yates Decamp, MD;  Location: [facility] INVASIVE CV LAB;  Service: Cardiovascular;  Laterality: N/A;   CORONARY ULTRASOUND/IVUS N/A 03/06/2020   Procedure: Intravascular Ultrasound/IVUS;  Surgeon: Yates Decamp, MD;  Location: [facility] INVASIVE CV LAB;  Service: Cardiovascular;  Laterality: N/A;   CORONARY/GRAFT ACUTE MI  REVASCULARIZATION N/A 03/05/2020   Procedure: Coronary/Graft Acute MI Revascularization;  Surgeon: Yates Decamp, MD;  Location: [facility] INVASIVE CV LAB;  Service: Cardiovascular;  Laterality: N/A;   LEFT HEART CATH AND CORONARY ANGIOGRAPHY N/A 03/05/2020   Procedure: LEFT HEART CATH AND CORONARY ANGIOGRAPHY;  Surgeon: Yates Decamp, MD;  Location: [facility] INVASIVE CV LAB;  Service: Cardiovascular;  Laterality: N/A;   MENISCECTOMY Left 2/15   Dr Ranell Patrick   REFRACTIVE SURGERY Bilateral 07/2019   UPPER GI ENDOSCOPY N/A Feb 2017    Family History  Problem Relation Age of Onset   Heart disease Mother    Stroke Father    Hypertension Father    Heart disease Father    Diabetes Father    Breast cancer Neg Hx     Social History   Socioeconomic History   Marital status: Divorced    Spouse name: Not on file   Number of children: 3   Years of education: Not on file   Highest education level: Some college, no degree  Occupational History   Occupation: Producer, television/film/video    Comment: [employer]  Tobacco Use   Smoking status: Never   Smokeless tobacco: Never  Vaping Use   Vaping status: Never Used  Substance and Sexual Activity   Alcohol use: No   Drug use: No   Sexual activity: Never  Other Topics Concern   Not on file  Social History Narrative   3 children--- 1 died   Lives alone      No living will   Requests son and daughter as health care POAs   Would accept resuscitation attempts but no prolonged artificial ventilation   Not sure about tube feeds   Social Drivers of Health   Financial Resource Strain: Low Risk  (12/26/2022)   Overall Financial Resource Strain (CARDIA)    Difficulty of Paying Living Expenses: Not hard at all  Food Insecurity: No Food Insecurity (12/26/2022)   Hunger Vital Sign    Worried About Running Out of Food in the Last Year: Never true    Ran Out of Food in the Last Year: Never true  Transportation Needs: No Transportation Needs (12/26/2022)   PRAPARE -  Administrator, Civil Service (Medical): No    Lack of Transportation (Non-Medical): No  Physical Activity: Insufficiently Active (12/26/2022)   Exercise Vital Sign    Days of Exercise per Week: 3 days    Minutes of Exercise per Session: 30 min  Stress: No Stress Concern Present (12/26/2022)   [employer] of Occupational Health - Occupational Stress Questionnaire    Feeling of Stress : Not at all  Social Connections: Moderately Integrated (12/26/2022)   Social Connection and Isolation Panel [NHANES]    Frequency of Communication with Friends and Family: More  than three times a week    Frequency of Social Gatherings with Friends and Family: More than three times a week    Attends Religious Services: More than 4 times per year    Active Member of [employer] or Organizations: Yes    Attends Engineer, structural: More than 4 times per year    Marital Status: Divorced  Intimate Partner Violence: Not At Risk (12/26/2022)   Humiliation, Afraid, Rape, and Kick questionnaire    Fear of Current or Ex-Partner: No    Emotionally Abused: No    Physically Abused: No    Sexually Abused: No   Review of Systems Appetite is off---some nausea after eating No diarrhea now---actually none for 4-5 days     Objective:   Physical Exam Constitutional:      Appearance: Normal appearance.  HENT:     Head:     Comments: Frontal tightness without significant tenderness    Right Ear: Tympanic membrane and ear canal normal.     Left Ear: Tympanic membrane and ear canal normal.     Mouth/Throat:     Pharynx: No oropharyngeal exudate or posterior oropharyngeal erythema.  Pulmonary:     Effort: Pulmonary effort is normal.     Breath sounds: Normal breath sounds. No wheezing or rales.  Musculoskeletal:     Cervical back: Neck supple.  Lymphadenopathy:     Cervical: No cervical adenopathy.  Neurological:     Mental Status: She is alert.            Assessment & Plan:

## 2023-01-23 NOTE — Assessment & Plan Note (Signed)
Seems to have secondary bacterial sinusitis after a viral infection Will give augmentin 875 bid for a week--which she has tolerated Tylenol Benzonatate for cough

## 2023-01-23 NOTE — Addendum Note (Signed)
Addended by: Eual Fines on: 01/23/2023 12:47 PM   Modules accepted: Orders

## 2023-01-24 ENCOUNTER — Ambulatory Visit: Payer: PPO

## 2023-01-30 ENCOUNTER — Ambulatory Visit: Payer: Self-pay | Admitting: Nurse Practitioner

## 2023-01-30 ENCOUNTER — Telehealth: Payer: PPO | Admitting: Physician Assistant

## 2023-01-30 DIAGNOSIS — B9689 Other specified bacterial agents as the cause of diseases classified elsewhere: Secondary | ICD-10-CM

## 2023-01-30 DIAGNOSIS — J208 Acute bronchitis due to other specified organisms: Secondary | ICD-10-CM

## 2023-01-30 DIAGNOSIS — J019 Acute sinusitis, unspecified: Secondary | ICD-10-CM | POA: Diagnosis not present

## 2023-01-30 MED ORDER — LEVOFLOXACIN 250 MG PO TABS: 250.0000 mg | ORAL_TABLET | Freq: Every day | ORAL | 0 refills | Status: AC

## 2023-01-30 MED ORDER — PREDNISONE 20 MG PO TABS: 40.0000 mg | ORAL_TABLET | Freq: Every day | ORAL | 0 refills | Status: DC

## 2023-01-30 NOTE — Progress Notes (Signed)
Virtual Visit Consent   ZELPHA PATY, you are scheduled for a virtual visit with a [facility] provider today. Just as with appointments in the office, your consent must be obtained to participate. Your consent will be active for this visit and any virtual visit you may have with one of our providers in the next 365 days. If you have a MyChart account, a copy of this consent can be sent to you electronically.  As this is a virtual visit, video technology does not allow for your provider to perform a traditional examination. This may limit your provider's ability to fully assess your condition. If your provider identifies any concerns that need to be evaluated in person or the need to arrange testing (such as labs, EKG, etc.), we will make arrangements to do so. Although advances in technology are sophisticated, we cannot ensure that it will always work on either your end or our end. If the connection with a video visit is poor, the visit may have to be switched to a telephone visit. With either a video or telephone visit, we are not always able to ensure that we have a secure connection.  By engaging in this virtual visit, you consent to the provision of healthcare and authorize for your insurance to be billed (if applicable) for the services provided during this visit. Depending on your insurance coverage, you may receive a charge related to this service.  I need to obtain your verbal consent now. Are you willing to proceed with your visit today? Amy Lin has provided verbal consent on 01/30/2023 for a virtual visit (video or telephone). Margaretann Loveless, PA-C  Date: 01/30/2023 3:05 PM  Virtual Visit via Video Note   I, Margaretann Loveless, connected with  Amy Lin  ([id number], 89) on 01/30/23 at  2:45 PM EST by a video-enabled telemedicine application and verified that I am speaking with the correct person using two identifiers.  Location: Patient: Virtual Visit Location  Patient: Home Provider: Virtual Visit Location Provider: Home Office   I discussed the limitations of evaluation and management by telemedicine and the availability of in person appointments. The patient expressed understanding and agreed to proceed.    Interactive audio and video communications were attempted, although failed due to patient's inability to connect to video. Continued visit with audio only interaction with patient agreement.   History of Present Illness: Amy Lin is a 89 y.o. who identifies as a female who was assigned female at birth, and is being seen today for cough.  HPI: Cough This is a new problem. The current episode started 1 to 4 weeks ago. The problem has been gradually worsening. The problem occurs every few minutes. The cough is Productive of sputum and productive of purulent sputum. Associated symptoms include chest pain (right upper chest hurts and burns with coughing), ear pain (right ear constant since initial), a fever (intermittent, today is normal, yesterday was elevated), headaches, myalgias (inital but improving), nasal congestion, postnasal drip, rhinorrhea and a sore throat (scratchy and irritated). Pertinent negatives include no chills, ear congestion, hemoptysis, shortness of breath or wheezing. Associated symptoms comments: Vomiting with cough.   Saw PCP on 01/23/23 and prescribed Augmentin BID x 7 days. No change or improvement in symptoms.  Problems:  Patient Active Problem List   Diagnosis Date Noted   B12 deficiency 01/23/2023   Pain and swelling of left lower extremity 11/30/2022   Mixed hyperlipidemia 09/23/2022   Acute pain of right shoulder  05/31/2022   Fatigue 05/31/2022   Right ear pain 05/31/2022   Pain of left heel 04/13/2022   Acute non-recurrent maxillary sinusitis 02/01/2022   COVID-19 01/06/2022   Sore throat 01/06/2022   Sensation of fullness in both ears 01/06/2022   History of ST elevation myocardial infarction (STEMI)  09/22/2021   Bradycardia 09/22/2021   Epigastric pain 05/29/2021   Preventative health care 11/30/2020   Paresthesia 11/30/2020   Acute pain of left shoulder 11/30/2020   Dyspnea on exertion 08/28/2020   PND (post-nasal drip) 08/28/2020   Sinus pressure 08/28/2020   Unstable angina (HCC)    Acute inferior myocardial infarction [facility]) 03/05/2020   CAD (coronary artery disease), native coronary artery 03/05/2020   Osteoarthritis 07/20/2017   Internal hemorrhoids 05/09/2017   History of adenomatous polyp of colon 02/03/2017   Fever 07/15/2016   Skin lesion 09/22/2008   Acute recurrent frontal sinusitis 07/30/2008   Seasonal allergies 11/01/2007   Hypercholesterolemia 09/06/2006   Hypothyroidism 08/24/2006   Essential hypertension 08/24/2006   GERD 08/24/2006   Osteoporosis 08/24/2006    Allergies: No Known Allergies Medications:  Current Outpatient Medications:    levofloxacin (LEVAQUIN) 250 MG tablet, Take 1 tablet (250 mg total) by mouth daily for 7 days., Disp: 7 tablet, Rfl: 0   predniSONE (DELTASONE) 20 MG tablet, Take 2 tablets (40 mg total) by mouth daily with breakfast., Disp: 10 tablet, Rfl: 0   amoxicillin-clavulanate (AUGMENTIN) 875-125 MG tablet, Take 1 tablet by mouth 2 (two) times daily., Disp: 14 tablet, Rfl: 0   aspirin 81 MG EC tablet, Take 81 mg by mouth daily., Disp: , Rfl:    benzonatate (TESSALON) 200 MG capsule, Take 1 capsule (200 mg total) by mouth 3 (three) times daily as needed for cough., Disp: 60 capsule, Rfl: 0   Cholecalciferol (VITAMIN D3) 2000 UNITS TABS, Take 1 tablet by mouth every evening., Disp: , Rfl:    denosumab (PROLIA) 60 MG/ML SOSY injection, Inject 60 mg into the skin every 6 (six) months., Disp: , Rfl:    ezetimibe (ZETIA) 10 MG tablet, Take 1 tablet (10 mg total) by mouth daily., Disp: 90 tablet, Rfl: 2   fluticasone (FLONASE) 50 MCG/ACT nasal spray, PLACE ONE SPRAY INTO BOTH NOSTRILS DAILY, Disp: 16 mL, Rfl: 2   furosemide (LASIX) 20 MG  tablet, Take 1 tablet (20 mg total) by mouth daily as needed for fluid or edema., Disp: 90 tablet, Rfl: 3   hydrocortisone 2.5 % lotion, APPLY TOPICALLY TWO TIMES DAILY, Disp: 59 mL, Rfl: 0   levocetirizine (XYZAL) 5 MG tablet, Take 5 mg by mouth every evening., Disp: , Rfl:    levothyroxine (SYNTHROID) 75 MCG tablet, TAKE ONE TABLET BY MOUTH ONCE A DAY. TAKE ON AN EMPTY STOMACH WITH A GLASS OF WATER ATLEAST 30-60 MIN BEFORE BREAKFAST, Disp: 90 tablet, Rfl: 1   lisinopril (ZESTRIL) 5 MG tablet, TAKE ONE TABLET BY MOUTH ONCE A DAY, Disp: 90 tablet, Rfl: PRN   mupirocin ointment (BACTROBAN) 2 %, Apply 1 Application topically 2 (two) times daily., Disp: 22 g, Rfl: 0   nitroGLYCERIN (NITROSTAT) 0.4 MG SL tablet, Place 1 tablet (0.4 mg total) under the tongue every 5 (five) minutes as needed for chest pain., Disp: 75 tablet, Rfl: 2   ondansetron (ZOFRAN-ODT) 4 MG disintegrating tablet, Take 1 tablet (4 mg total) by mouth every 8 (eight) hours as needed for nausea or vomiting., Disp: 20 tablet, Rfl: 0   pantoprazole (PROTONIX) 20 MG tablet, TAKE ONE TABLET BY  MOUTH TWICE A DAY, Disp: 180 tablet, Rfl: 2   potassium chloride (KLOR-CON M) 10 MEQ tablet, Take 1 tablet (10 mEq total) by mouth daily., Disp: 90 tablet, Rfl: 3   rosuvastatin (CRESTOR) 20 MG tablet, TAKE ONE TABLET BY MOUTH ONCE A DAY, Disp: 90 tablet, Rfl: PRN   triamcinolone cream (KENALOG) 0.5 %, Apply 1 Application topically 2 (two) times daily., Disp: 30 g, Rfl: 0  Observations/Objective: Patient is well-developed, well-nourished in no acute distress.  Resting comfortably at home.  Head is normocephalic, atraumatic.  No labored breathing.  Speech is clear and coherent with logical content.  Patient is alert and oriented at baseline.  Frequent coughing heard; Deep, bronchial cough with frequent throat clearing reported to be quite productive  Assessment and Plan: 1. Acute bacterial bronchitis (Primary) - levofloxacin (LEVAQUIN) 250 MG  tablet; Take 1 tablet (250 mg total) by mouth daily for 7 days.  Dispense: 7 tablet; Refill: 0 - predniSONE (DELTASONE) 20 MG tablet; Take 2 tablets (40 mg total) by mouth daily with breakfast.  Dispense: 10 tablet; Refill: 0  2. Acute bacterial sinusitis - levofloxacin (LEVAQUIN) 250 MG tablet; Take 1 tablet (250 mg total) by mouth daily for 7 days.  Dispense: 7 tablet; Refill: 0 - predniSONE (DELTASONE) 20 MG tablet; Take 2 tablets (40 mg total) by mouth daily with breakfast.  Dispense: 10 tablet; Refill: 0  - Failed first line antibiotics with more productive cough and chest congestion, continued sinus congestion and right ear pressure/fullness, intermittent fevers, and a localized burning pain in the right upper chest with coughing suspicious for a community-acquired pneumonia or atypical "walking" pneumonia vs bacterial upper respiratory infection - Will treat more aggressively with Levofloxacin as above - Add Prednisone for congestion and inflammation - May use Tessalon perles already prescribed as needed - May add Mucinex (PLAIN) - Steam and humidifier can help breathing if needed - Strict ER precautions verbally discussed if breathing worsens or develops shortness of breath or difficulty breathing - Seek in person evaluation if not improving   Follow Up Instructions: I discussed the assessment and treatment plan with the patient. The patient was provided an opportunity to ask questions and all were answered. The patient agreed with the plan and demonstrated an understanding of the instructions.  A copy of instructions were sent to the patient via MyChart unless otherwise noted below.    The patient was advised to call back or seek an in-person evaluation if the symptoms worsen or if the condition fails to improve as anticipated.  I have spent 27 minutes in review and updating patient chart, medical decision making and visit with patient discussing recurrent upper respiratory symptoms  and treatment plan.   Margaretann Loveless, PA-C    Margaretann Loveless, [place]

## 2023-01-30 NOTE — Telephone Encounter (Signed)
Copied from CRM [id number]. Topic: Clinical - Red Word Triage >> Jan 30, 2023  7:55 AM Efraim Kaufmann C wrote: Red Word that prompted transfer to Nurse Triage: patient saw doctor last Monday, was on antibiotic and isn't any better. Still coughing up mucus with some pain in chest and head pain.   Chief Complaint:  F/U Sinus Infection - Patient doesn't believe she feels any better. She took last antibiotics on yesterday. Symptoms:  Notes Low grade temp 99   on yesterday.Symptoms: Coughing is nearly continually with yellowish drainage. Still unable to do anything due to the coughing. Hurting in her chests area from coughing. Frequency:  constantly  and daily since prescribed antibiotic Pertinent Negatives: Patient denies difficulty breathing Disposition: [] ED /[] Urgent Care (no appt availability in office) / [] Appointment(In office/virtual)/ [x]  [facility]/ [] Home Care/ [] Refused Recommended Disposition /[] [facility]/ []  Follow-up with PCP Additional Notes:  Would prefer to be seen in the office. Reason for Disposition  [1] Taking antibiotic > 72 hours (3 days) AND [2] sinus pain not improved  Answer Assessment - Initial Assessment Questions 1. ANTIBIOTIC: "What antibiotic are you taking?" "How many times a day?"     AmoxcLastly  dose on yesterday 2. ONSET: "When was the antibiotic started?"  Lasty  dose on yesterday 3. PAIN: "How bad is the sinus pain?"   (Scale 1-10; mild, moderate or severe)     - MODERATE (4-7): interferes with normal activities (e.g., work or school) or awakens from sleep 4. FEVER: "Do you have a fever?" If Yes, ask: "What is it, how was it measured, and when did it start?"       Don't think she is . Yesterday 99.0 F forehead 5. SYMPTOMS: "Are there any other symptoms you're concerned about?" If Yes, ask: "When did it start?"     Cough  productive  yellowish , Mucus   is so much  Protocols used: Sinus Infection on Antibiotic Follow-up Call-[facility]

## 2023-01-30 NOTE — Patient Instructions (Signed)
Amy Lin, thank you for joining Margaretann Loveless, PA-C for today's virtual visit.  While this provider is not your primary care provider (PCP), if your PCP is located in our provider database this encounter information will be shared with them immediately following your visit.   A [facility] MyChart account gives you access to today's visit and all your visits, tests, and labs performed at [facility] " click here if you don't have a [facility] MyChart account or go to [email or web address]  Consent: (Patient) Amy Lin provided verbal consent for this virtual visit at the beginning of the encounter.  Current Medications:  Current Outpatient Medications:    levofloxacin (LEVAQUIN) 250 MG tablet, Take 1 tablet (250 mg total) by mouth daily for 7 days., Disp: 7 tablet, Rfl: 0   predniSONE (DELTASONE) 20 MG tablet, Take 2 tablets (40 mg total) by mouth daily with breakfast., Disp: 10 tablet, Rfl: 0   amoxicillin-clavulanate (AUGMENTIN) 875-125 MG tablet, Take 1 tablet by mouth 2 (two) times daily., Disp: 14 tablet, Rfl: 0   aspirin 81 MG EC tablet, Take 81 mg by mouth daily., Disp: , Rfl:    benzonatate (TESSALON) 200 MG capsule, Take 1 capsule (200 mg total) by mouth 3 (three) times daily as needed for cough., Disp: 60 capsule, Rfl: 0   Cholecalciferol (VITAMIN D3) 2000 UNITS TABS, Take 1 tablet by mouth every evening., Disp: , Rfl:    denosumab (PROLIA) 60 MG/ML SOSY injection, Inject 60 mg into the skin every 6 (six) months., Disp: , Rfl:    ezetimibe (ZETIA) 10 MG tablet, Take 1 tablet (10 mg total) by mouth daily., Disp: 90 tablet, Rfl: 2   fluticasone (FLONASE) 50 MCG/ACT nasal spray, PLACE ONE SPRAY INTO BOTH NOSTRILS DAILY, Disp: 16 mL, Rfl: 2   furosemide (LASIX) 20 MG tablet, Take 1 tablet (20 mg total) by mouth daily as needed for fluid or edema., Disp: 90 tablet, Rfl: 3   hydrocortisone 2.5 % lotion, APPLY TOPICALLY TWO TIMES DAILY, Disp: 59 mL, Rfl: 0    levocetirizine (XYZAL) 5 MG tablet, Take 5 mg by mouth every evening., Disp: , Rfl:    levothyroxine (SYNTHROID) 75 MCG tablet, TAKE ONE TABLET BY MOUTH ONCE A DAY. TAKE ON AN EMPTY STOMACH WITH A GLASS OF WATER ATLEAST 30-60 MIN BEFORE BREAKFAST, Disp: 90 tablet, Rfl: 1   lisinopril (ZESTRIL) 5 MG tablet, TAKE ONE TABLET BY MOUTH ONCE A DAY, Disp: 90 tablet, Rfl: PRN   mupirocin ointment (BACTROBAN) 2 %, Apply 1 Application topically 2 (two) times daily., Disp: 22 g, Rfl: 0   nitroGLYCERIN (NITROSTAT) 0.4 MG SL tablet, Place 1 tablet (0.4 mg total) under the tongue every 5 (five) minutes as needed for chest pain., Disp: 75 tablet, Rfl: 2   ondansetron (ZOFRAN-ODT) 4 MG disintegrating tablet, Take 1 tablet (4 mg total) by mouth every 8 (eight) hours as needed for nausea or vomiting., Disp: 20 tablet, Rfl: 0   pantoprazole (PROTONIX) 20 MG tablet, TAKE ONE TABLET BY MOUTH TWICE A DAY, Disp: 180 tablet, Rfl: 2   potassium chloride (KLOR-CON M) 10 MEQ tablet, Take 1 tablet (10 mEq total) by mouth daily., Disp: 90 tablet, Rfl: 3   rosuvastatin (CRESTOR) 20 MG tablet, TAKE ONE TABLET BY MOUTH ONCE A DAY, Disp: 90 tablet, Rfl: PRN   triamcinolone cream (KENALOG) 0.5 %, Apply 1 Application topically 2 (two) times daily., Disp: 30 g, Rfl: 0   Medications ordered in this encounter:  Meds  ordered this encounter  Medications   levofloxacin (LEVAQUIN) 250 MG tablet    Sig: Take 1 tablet (250 mg total) by mouth daily for 7 days.    Dispense:  7 tablet    Refill:  0    Supervising Provider:   Merrilee Jansky [[id number]]   predniSONE (DELTASONE) 20 MG tablet    Sig: Take 2 tablets (40 mg total) by mouth daily with breakfast.    Dispense:  10 tablet    Refill:  0    Supervising Provider:   Merrilee Jansky [[id number]]     *If you need refills on other medications prior to your next appointment, please contact your pharmacy*  Follow-Up: Call back or seek an in-person evaluation if the symptoms worsen or  if the condition fails to improve as anticipated.  [facility] [phone]  Other Instructions Community-Acquired Pneumonia, Adult Pneumonia is a lung infection that causes inflammation and the buildup of mucus and fluids in the lungs. This may cause coughing and difficulty breathing. Community-acquired pneumonia is pneumonia that develops in people who are not, and have not recently been, in a hospital or other health care facility. Usually, pneumonia develops as a result of an illness that is caused by a virus, such as the common cold and the flu (influenza). It can also be caused by bacteria or fungi. While the common cold and influenza can pass from person to person (are contagious), pneumonia itself is not considered contagious. What are the causes? This condition may be caused by: Viruses. Bacteria. Fungi. What increases the risk? The following factors may make you more likely to develop this condition: Being over age 50 or having certain medical conditions, such as: A long-term (chronic) disease, such as: chronic obstructive pulmonary disease (COPD), asthma, heart failure, diabetes, or kidney disease. A condition that increases the risk of breathing in (aspirating) mucus and other fluids from your mouth and nose. A weakened body defense system (immune system). Having had your spleen removed (splenectomy). The spleen is the organ that helps fight germs and infections. Not cleaning your teeth and gums well (poor dental hygiene). Using tobacco products. Traveling to places where germs that cause pneumonia are present or being near certain animals or animal habitats that could have germs that cause pneumonia. What are the signs or symptoms? Symptoms of this condition include: A dry cough or a wet (productive) cough. A fever, sweating, or chills. Chest pain, especially when breathing deeply or coughing. Fast breathing, difficulty breathing, or shortness of  breath. Tiredness (fatigue) and muscle aches. How is this diagnosed? This condition may be diagnosed based on your medical history or a physical exam. You may also have tests, including: Imaging, such as a chest X-ray or lung ultrasound. Tests of: The level of oxygen and other gases in your blood. Mucus from your lungs (sputum). Fluid around your lungs (pleural fluid). Your urine. How is this treated? Treatment for this condition depends on many factors, such as the cause of your pneumonia, your medicines, and other medical conditions that you have. For most adults, pneumonia may be treated at home. In some cases, treatment must happen in a hospital and may include: Medicines that are given by mouth (orally) or through an IV, including: Antibiotic medicines, if bacteria caused the pneumonia. Medicines that kill viruses (antiviral medicines), if a virus caused the pneumonia. Oxygen therapy. Severe pneumonia, although rare, may require the following treatments: Mechanical ventilation.This procedure uses a machine to help you breathe  if you cannot breathe well on your own or maintain a safe level of blood oxygen. Thoracentesis. This procedure removes any buildup of pleural fluid to help with breathing. Follow these instructions at home:  Medicines Take over-the-counter and prescription medicines only as told by your health care provider. Take cough medicine only if you have trouble sleeping. Cough medicine can prevent your body from removing mucus from your lungs. If you were prescribed antibiotics, take them as told by your health care provider. Do not stop taking the antibiotic even if you start to feel better. Lifestyle     Do not drink alcohol. Do not use any products that contain nicotine or tobacco. These products include cigarettes, chewing tobacco, and vaping devices, such as e-cigarettes. If you need help quitting, ask your health care provider. Eat a healthy diet. This includes  plenty of vegetables, fruits, whole grains, low-fat dairy products, and lean protein. General instructions Rest a lot and get at least 8 hours of sleep each night. Sleep in a partly upright position at night. Place a few pillows under your head or sleep in a reclining chair. Return to your normal activities as told by your health care provider. Ask your health care provider what activities are safe for you. Drink enough fluid to keep your urine pale yellow. This helps to thin the mucus in your lungs. If your throat is sore, gargle with a mixture of salt and water 3-4 times a day or as needed. To make salt water, completely dissolve -1 tsp (3-6 g) of salt in 1 cup (237 mL) of warm water. Keep all follow-up visits. How is this prevented? You can lower your risk of developing community-acquired pneumonia by: Getting the pneumonia vaccine. There are different types and schedules of pneumonia vaccines. Ask your health care provider which option is best for you. Consider getting the pneumonia vaccine if: You are older than 87 years of age. You are 36-108 years of age and are receiving cancer treatment, have chronic lung disease, or have other medical conditions that affect your immune system. Ask your health care provider if this applies to you. Getting your influenza vaccine every year. Ask your health care provider which type of vaccine is best for you. Getting regular dental checkups. Washing your hands often with soap and water for at least 20 seconds. If soap and water are not available, use hand sanitizer. Contact a health care provider if: You have a fever. You have trouble sleeping because you cannot control your cough with cough medicine. Get help right away if: Your shortness of breath becomes worse. Your chest pain increases. Your sickness becomes worse, especially if you are an older adult or have a weak immune system. You cough up blood. These symptoms may be an emergency. Get help  right away. Call 911. Do not wait to see if the symptoms will go away. Do not drive yourself to the hospital. Summary Pneumonia is an infection of the lungs. Community-acquired pneumonia develops in people who have not been in the hospital. It can be caused by bacteria, viruses, or fungi. This condition may be treated with antibiotics or antiviral medicines. Severe pneumonia may require a hospital stay and treatment to help with breathing. This information is not intended to replace advice given to you by your health care provider. Make sure you discuss any questions you have with your health care provider. Document Revised: 03/17/2021 Document Reviewed: 03/17/2021 Elsevier Patient Education  2024 [employer].    If you  have been instructed to have an in-person evaluation today at a local Urgent Care facility, please use the link below. It will take you to a list of all of our available [facility], including address, phone number and hours of operation. Please do not delay care.  [facility]  If you or a family member do not have a primary care provider, use the link below to schedule a visit and establish care. When you choose a [facility] primary care physician or advanced practice provider, you gain a long-term partner in health. Find a Primary Care Provider  Learn more about [facility]'s in-office and virtual care options:  - Get Care Now

## 2023-01-31 ENCOUNTER — Ambulatory Visit: Payer: PPO

## 2023-01-31 DIAGNOSIS — C44722 Squamous cell carcinoma of skin of right lower limb, including hip: Secondary | ICD-10-CM | POA: Diagnosis not present

## 2023-02-07 ENCOUNTER — Ambulatory Visit: Payer: PPO

## 2023-02-08 ENCOUNTER — Telehealth: Payer: Self-pay

## 2023-02-08 ENCOUNTER — Ambulatory Visit: Payer: PPO

## 2023-02-08 DIAGNOSIS — E538 Deficiency of other specified B group vitamins: Secondary | ICD-10-CM

## 2023-02-08 MED ORDER — CYANOCOBALAMIN 1000 MCG/ML IJ SOLN
1000.0000 ug | Freq: Once | INTRAMUSCULAR | Status: AC
  Administered 2023-02-08: 1000 ug via INTRAMUSCULAR

## 2023-02-08 NOTE — Progress Notes (Signed)
 Per orders of [name], NP , injection of vitamin b 12 given by Lewanda Rife in right deltoid. Patient tolerated injection well. Patient will make appointment for 1 month pt also scheduled 1 month prior to [place] for B 12 labs.Amy Lin

## 2023-02-08 NOTE — Telephone Encounter (Signed)
 If she continues to experience the dizziness we can discontinue the lisinopril and have her follow up in office in 1 month

## 2023-02-08 NOTE — Telephone Encounter (Signed)
 Pt said for couple of days had dizziness; today felt lightheaded on and off; today BP 126/ something. Pt said she was drinking a lot of water and felt some better today. Offered pt appt to be seen but pt said she would wait and see how she felt and pt would cb if appt needed. UC & ED precautions given and pt voiced understanding. No H/A, CP or SOB. If pt schedules appt pt will bring home BP cuff to ck accuracy. FYI to Adina Crandall NP.

## 2023-02-08 NOTE — Telephone Encounter (Signed)
 Contacted pt to give instructions per pcp regarding dizzy symptoms. Hydration and slow position changes.  Pt verbalized understanding and agreed.   Pt stated that she took her BP yesterday and was low 90/? And felt dizzy even with lots of water intake.  Pt states that she took her BP this morning after breakfast; 120/56 and only felt slightly lightheaded throughout the day.  Pt agreed to continue taking BP daily.   No further questions or concerns.

## 2023-02-08 NOTE — Telephone Encounter (Signed)
 Noted. Encourage hydration and slow position changes. Also check blood pressure daily

## 2023-02-09 NOTE — Telephone Encounter (Signed)
 Contacted pt. Pt complains of not taking lisinopril yesterday due to low BP systolic in the 90s and has not been dizzy. Pt states that she will let us know if dizziness continues.

## 2023-02-20 ENCOUNTER — Telehealth: Payer: Self-pay

## 2023-02-20 NOTE — Telephone Encounter (Signed)
Updated specimen tracking and history from [facility] progress notes of right proximal pretibial region. aw

## 2023-02-21 ENCOUNTER — Other Ambulatory Visit: Payer: Self-pay | Admitting: Nurse Practitioner

## 2023-02-21 DIAGNOSIS — E039 Hypothyroidism, unspecified: Secondary | ICD-10-CM

## 2023-02-23 ENCOUNTER — Other Ambulatory Visit: Payer: Self-pay | Admitting: Nurse Practitioner

## 2023-02-23 DIAGNOSIS — E538 Deficiency of other specified B group vitamins: Secondary | ICD-10-CM

## 2023-02-23 NOTE — Progress Notes (Signed)
Lab orders only 

## 2023-03-02 ENCOUNTER — Ambulatory Visit (HOSPITAL_COMMUNITY): Payer: PPO | Attending: Cardiology

## 2023-03-02 ENCOUNTER — Other Ambulatory Visit (HOSPITAL_COMMUNITY): Payer: Self-pay | Admitting: Cardiology

## 2023-03-02 ENCOUNTER — Ambulatory Visit: Payer: PPO

## 2023-03-02 ENCOUNTER — Encounter: Payer: Self-pay | Admitting: Cardiology

## 2023-03-02 ENCOUNTER — Other Ambulatory Visit: Payer: PPO

## 2023-03-02 DIAGNOSIS — I2510 Atherosclerotic heart disease of native coronary artery without angina pectoris: Secondary | ICD-10-CM | POA: Diagnosis not present

## 2023-03-02 LAB — ECHOCARDIOGRAM COMPLETE
Area-P 1/2: 3.66 cm2
S' Lateral: 2.6 cm

## 2023-03-09 ENCOUNTER — Other Ambulatory Visit: Payer: PPO

## 2023-03-09 ENCOUNTER — Ambulatory Visit: Payer: PPO

## 2023-03-09 DIAGNOSIS — E538 Deficiency of other specified B group vitamins: Secondary | ICD-10-CM | POA: Diagnosis not present

## 2023-03-09 LAB — VITAMIN B12: Vitamin B-12: 301 pg/mL (ref 211–911)

## 2023-03-09 MED ORDER — CYANOCOBALAMIN 1000 MCG/ML IJ SOLN
1000.0000 ug | Freq: Once | INTRAMUSCULAR | Status: AC
Start: 2023-03-09 — End: 2023-03-09
  Administered 2023-03-09: 1000 ug via INTRAMUSCULAR

## 2023-03-09 NOTE — Progress Notes (Signed)
 Per orders of Winthrop Hawks, NP , injection of B-12 given by Eller Gut in left deltoid. Patient tolerated injection well. Patient will make appointment for 1 month.

## 2023-03-14 ENCOUNTER — Encounter: Payer: Self-pay | Admitting: Nurse Practitioner

## 2023-03-16 ENCOUNTER — Other Ambulatory Visit: Payer: Self-pay | Admitting: Nurse Practitioner

## 2023-03-20 ENCOUNTER — Ambulatory Visit (INDEPENDENT_AMBULATORY_CARE_PROVIDER_SITE_OTHER): Payer: PPO | Admitting: Family Medicine

## 2023-03-20 ENCOUNTER — Telehealth: Payer: Self-pay | Admitting: Family Medicine

## 2023-03-20 ENCOUNTER — Ambulatory Visit: Payer: Self-pay | Admitting: Nurse Practitioner

## 2023-03-20 VITALS — BP 152/70 | HR 67 | Temp 98.3°F | Ht 63.0 in | Wt 145.1 lb

## 2023-03-20 DIAGNOSIS — I2510 Atherosclerotic heart disease of native coronary artery without angina pectoris: Secondary | ICD-10-CM

## 2023-03-20 DIAGNOSIS — R051 Acute cough: Secondary | ICD-10-CM | POA: Diagnosis not present

## 2023-03-20 DIAGNOSIS — U071 COVID-19: Secondary | ICD-10-CM

## 2023-03-20 DIAGNOSIS — I10 Essential (primary) hypertension: Secondary | ICD-10-CM

## 2023-03-20 LAB — POCT INFLUENZA A/B
Influenza A, POC: NEGATIVE
Influenza B, POC: NEGATIVE

## 2023-03-20 LAB — POC COVID19 BINAXNOW: SARS Coronavirus 2 Ag: POSITIVE — AB

## 2023-03-20 MED ORDER — NIRMATRELVIR/RITONAVIR (PAXLOVID) TABLET (RENAL DOSING): 2.0000 | ORAL_TABLET | Freq: Two times a day (BID) | ORAL | 0 refills | Status: AC

## 2023-03-20 MED ORDER — NIRMATRELVIR/RITONAVIR (PAXLOVID) TABLET (RENAL DOSING): 2.0000 | ORAL_TABLET | Freq: Two times a day (BID) | ORAL | 0 refills | Status: DC

## 2023-03-20 NOTE — Assessment & Plan Note (Addendum)
Reviewed currently approved antiviral treatments.  Reviewed expected course of illness, anticipated course of recovery, as well as red flags to suggest COVID pneumonia and/or to seek urgent in-person care. Reviewed CDC isolation/quarantine guidelines.  Encouraged fluids and rest. Reviewed further supportive care measures at home including vit C 500mg  bid, vit D 2000 IU daily, zinc 100mg  daily, tylenol PRN, pepcid 20mg  BID PRN.   Recommend:  Renally dosed Paxlovid   Paxlovid drug interactions:  Rosuvastatin - hold while on Paxlovid   [email or web address]

## 2023-03-20 NOTE — Telephone Encounter (Signed)
 Noted

## 2023-03-20 NOTE — Assessment & Plan Note (Signed)
Discussed avoiding decongestants. Ok to use Coricidin brand OTC cold remedies

## 2023-03-20 NOTE — Telephone Encounter (Signed)
Seen today, dx COVID, treated with paxlovid. Please call to remind to hold rosuvastatin (Crestor) while on Paxlovid.

## 2023-03-20 NOTE — Telephone Encounter (Signed)
 Left message to return call to our office.

## 2023-03-20 NOTE — Telephone Encounter (Signed)
  Chief Complaint: cough Symptoms: productive cough, fever, wheezing, runny nose, congestion Frequency: 4 days Pertinent Negatives: Patient denies SOB, CP Disposition: [] ED /[] Urgent Care (no appt availability in office) / [x] Appointment(In office/virtual)/ []  [facility]/ [] Home Care/ [] Refused Recommended Disposition /[]  Mobile Bus/ []  Follow-up with PCP Additional Notes: Patient calls reporting productive cough, fever that began 4 days ago. States she is taking OTC medications with no relief. Per protocol, patient to be evaluated within 4 hours. First available appointment with PCP is 03/21/23. Patient scheduled with first available provider in afternoon at clinic at patient request for 1400 today. Patient declined earliest appt with alternate provider. Care advice reviewed, patient verbalized understanding and denies further questions at this time. Alerting PCP for review.    Copied from CRM [id number]. Topic: Clinical - Red Word Triage >> Mar 20, 2023  7:51 AM Deaijah H wrote: Red Word that prompted transfer to Nurse Triage: Fever (99.7-101), chest hurting, phlegm.. Gotten worse Reason for Disposition  Wheezing is present  Answer Assessment - Initial Assessment Questions 1. ONSET: "When did the cough begin?"      Began 4 days ago 2. SEVERITY: "How bad is the cough today?"      "Pretty bad", subsides some when she coughs up phlegm. 3. SPUTUM: "Describe the color of your sputum" (none, dry cough; clear, white, yellow, green)     Yellow 4. HEMOPTYSIS: "Are you coughing up any blood?" If so ask: "How much?" (flecks, streaks, tablespoons, etc.)     Denies 5. DIFFICULTY BREATHING: "Are you having difficulty breathing?" If Yes, ask: "How bad is it?" (e.g., mild, moderate, severe)    - MILD: No SOB at rest, mild SOB with walking, speaks normally in sentences, can lie down, no retractions, pulse < 100.    - MODERATE: SOB at rest, SOB with minimal exertion and prefers to sit,  cannot lie down flat, speaks in phrases, mild retractions, audible wheezing, pulse 100-120.    - SEVERE: Very SOB at rest, speaks in single words, struggling to breathe, sitting hunched forward, retractions, pulse > 120      Denies 6. FEVER: "Do you have a fever?" If Yes, ask: "What is your temperature, how was it measured, and when did it start?"     States yes, 101F yesterday 7. CARDIAC HISTORY: "Do you have any history of heart disease?" (e.g., heart attack, congestive heart failure)      Two stents- heart attack, HTN 8. LUNG HISTORY: "Do you have any history of lung disease?"  (e.g., pulmonary embolus, asthma, emphysema)     Denies 9. PE RISK FACTORS: "Do you have a history of blood clots?" (or: recent major surgery, recent prolonged travel, bedridden)     Denies 10. OTHER SYMPTOMS: "Do you have any other symptoms?" (e.g., runny nose, wheezing, chest pain)       Barky cough, chest discomfort, fever, runny nose, sore throat from coughing, R ear ache, wheezing intermittently  12. TRAVEL: "Have you traveled out of the country in the last month?" (e.g., travel history, exposures)       Denies  Protocols used: Cough - Acute Productive-A-AH

## 2023-03-20 NOTE — Patient Instructions (Addendum)
You tested positive for covid infection Take paxlovid 2 tablets twice daily for 5 days.  Continue vitamin C and D, drink plenty of fluids and rest.  Let us know if not improving with treatment.  Hold Rosuvastatin while on Paxlovid.   If unaffordable, sign up for discount at below website: [email or web address]

## 2023-03-20 NOTE — Progress Notes (Signed)
Ph: [phone] Fax: [phone]   Patient ID: Amy Lin, female    DOB: 01-21-1934, 89 y.o.   MRN: [id number]  This visit was conducted in person.  BP (!) 152/70 (BP Location: Right Arm, Cuff Size: Normal)   Pulse 67   Temp 98.3 F (36.8 C) (Oral)   Ht 5\' 3"  (1.6 m)   Wt 145 lb 2 oz (65.8 kg)   SpO2 93%   BMI 25.71 kg/m    CC: cough, fever Subjective:   HPI: Amy Lin is a 89 y.o. female presenting on 03/20/2023 for Cough (C/o prod cough, fever- max 101, wheezing, runny nose and nasal congestion. Sxs started 03/15/23. Tried OTC Extra Strength Coricidin and generic Mucinex. )   First day of symptoms: 03/16/2023 Tested COVID positive: today  Current symptoms: cough, congestion, rhinorrhea, sore throat, fever Tmax 101. R earach, fatigue, body aches, nausea.  No: tooth pain, abd pain, diarrhea, dyspnea.  Treatments to date: Coricidin max strength, guaifenesin, she is unsure if she may have taken decongestant as well.  Risk factors include: age, HTN, CAD,   COVID vaccination status: x3  Last COVID infection was 12/2021.   Last seen end of December in office then virtually with dx sinusitis/bronchitis treated with augmentin 7d course followed by levaquin 7d course + prednisone.      Relevant past medical, surgical, family and social history reviewed and updated as indicated. Interim medical history since our last visit reviewed. Allergies and medications reviewed and updated. Outpatient Medications Prior to Visit  Medication Sig Dispense Refill   aspirin 81 MG EC tablet Take 81 mg by mouth daily.     benzonatate (TESSALON) 200 MG capsule Take 1 capsule (200 mg total) by mouth 3 (three) times daily as needed for cough. 60 capsule 0   Cholecalciferol (VITAMIN D3) 2000 UNITS TABS Take 1 tablet by mouth every evening.     denosumab (PROLIA) 60 MG/ML SOSY injection Inject 60 mg into the skin every 6 (six) months.     ezetimibe (ZETIA) 10 MG tablet Take 1 tablet (10 mg  total) by mouth daily. 90 tablet 2   fluticasone (FLONASE) 50 MCG/ACT nasal spray PLACE ONE SPRAY INTO BOTH NOSTRILS DAILY 16 mL 2   furosemide (LASIX) 20 MG tablet Take 1 tablet (20 mg total) by mouth daily as needed for fluid or edema. 90 tablet 3   hydrocortisone 2.5 % lotion APPLY TOPICALLY TWO TIMES DAILY 59 mL 0   levocetirizine (XYZAL) 5 MG tablet Take 5 mg by mouth every evening.     levothyroxine (SYNTHROID) 75 MCG tablet TAKE ONE TABLET BY MOUTH ONCE A DAY. TAKE ON AN EMPTY STOMACH WITH A GLASS OF WATER ATLEAST 30-60 MIN BEFORE BREAKFAST 90 tablet 1   lisinopril (ZESTRIL) 5 MG tablet TAKE ONE TABLET BY MOUTH ONCE A DAY 90 tablet PRN   mupirocin ointment (BACTROBAN) 2 % Apply 1 Application topically 2 (two) times daily. 22 g 0   nitroGLYCERIN (NITROSTAT) 0.4 MG SL tablet Place 1 tablet (0.4 mg total) under the tongue every 5 (five) minutes as needed for chest pain. 75 tablet 2   ondansetron (ZOFRAN-ODT) 4 MG disintegrating tablet Take 1 tablet (4 mg total) by mouth every 8 (eight) hours as needed for nausea or vomiting. 20 tablet 0   pantoprazole (PROTONIX) 20 MG tablet TAKE ONE TABLET BY MOUTH TWICE A DAY 180 tablet 2   potassium chloride (KLOR-CON M) 10 MEQ tablet Take 1 tablet (10 mEq total)  by mouth daily. 90 tablet 3   rosuvastatin (CRESTOR) 20 MG tablet TAKE ONE TABLET BY MOUTH ONCE A DAY 90 tablet PRN   triamcinolone cream (KENALOG) 0.5 % Apply 1 Application topically 2 (two) times daily. 30 g 0   amoxicillin-clavulanate (AUGMENTIN) 875-125 MG tablet Take 1 tablet by mouth 2 (two) times daily. 14 tablet 0   predniSONE (DELTASONE) 20 MG tablet Take 2 tablets (40 mg total) by mouth daily with breakfast. 10 tablet 0   No facility-administered medications prior to visit.     Per HPI unless specifically indicated in ROS section below Review of Systems  Objective:  BP (!) 152/70 (BP Location: Right Arm, Cuff Size: Normal)   Pulse 67   Temp 98.3 F (36.8 C) (Oral)   Ht 5\' 3"  (1.6  m)   Wt 145 lb 2 oz (65.8 kg)   SpO2 93%   BMI 25.71 kg/m   Wt Readings from Last 3 Encounters:  03/20/23 145 lb 2 oz (65.8 kg)  01/23/23 140 lb (63.5 kg)  12/26/22 144 lb (65.3 kg)      Physical Exam Vitals and nursing note reviewed.  Constitutional:      Appearance: Normal appearance. She is not ill-appearing.  HENT:     Head: Normocephalic and atraumatic.     Right Ear: Tympanic membrane, ear canal and external ear normal. There is no impacted cerumen.     Left Ear: Tympanic membrane, ear canal and external ear normal. There is no impacted cerumen.     Mouth/Throat:     Comments: Wearing mask Eyes:     Extraocular Movements: Extraocular movements intact.     Conjunctiva/sclera: Conjunctivae normal.     Pupils: Pupils are equal, round, and reactive to light.  Cardiovascular:     Rate and Rhythm: Normal rate and regular rhythm.     Pulses: Normal pulses.     Heart sounds: Normal heart sounds. No murmur heard. Pulmonary:     Effort: Pulmonary effort is normal. No respiratory distress.     Breath sounds: Normal breath sounds. No wheezing, rhonchi or rales.  Musculoskeletal:     Cervical back: Normal range of motion and neck supple.     Right lower leg: No edema.     Left lower leg: No edema.  Lymphadenopathy:     Cervical: No cervical adenopathy.  Skin:    General: Skin is warm and dry.     Findings: No erythema or rash.  Neurological:     Mental Status: She is alert.  Psychiatric:        Mood and Affect: Mood normal.        Behavior: Behavior normal.       Results for orders placed or performed in visit on 03/20/23  POC COVID-19 BinaxNow   Collection Time: 03/20/23  2:13 PM  Result Value Ref Range   SARS Coronavirus 2 Ag Positive (A) Negative  POCT Influenza A/B   Collection Time: 03/20/23  2:25 PM  Result Value Ref Range   Influenza A, POC Negative Negative   Influenza B, POC Negative Negative   Lab Results  Component Value Date   NA 139 11/30/2022   CL  105 11/30/2022   K 4.7 11/30/2022   CO2 28 11/30/2022   BUN 15 11/30/2022   CREATININE 0.94 11/30/2022   GFR 54.82 (L) 11/30/2022   CALCIUM 9.5 11/30/2022   ALBUMIN 3.7 11/30/2022   GLUCOSE 88 11/30/2022    Assessment & Plan:  Problem List Items Addressed This Visit     Essential hypertension   Discussed avoiding decongestants. Ok to use Coricidin brand OTC cold remedies      CAD (coronary artery disease), native coronary artery   COVID-19 virus infection - Primary   Reviewed currently approved antiviral treatments.  Reviewed expected course of illness, anticipated course of recovery, as well as red flags to suggest COVID pneumonia and/or to seek urgent in-person care. Reviewed CDC isolation/quarantine guidelines.  Encouraged fluids and rest. Reviewed further supportive care measures at home including vit C 500mg  bid, vit D 2000 IU daily, zinc 100mg  daily, tylenol PRN, pepcid 20mg  BID PRN.   Recommend:  Renally dosed Paxlovid   Paxlovid drug interactions:  Rosuvastatin - hold while on Paxlovid   [email or web address]       Relevant Medications   nirmatrelvir/ritonavir, renal dosing, (PAXLOVID) 10 x 150 MG & 10 x 100MG  TABS   Other Visit Diagnoses       Acute cough       Relevant Orders   POC COVID-19 BinaxNow (Completed)   POCT Influenza A/B (Completed)        Meds ordered this encounter  Medications   DISCONTD: nirmatrelvir/ritonavir, renal dosing, (PAXLOVID) 10 x 150 MG & 10 x 100MG  TABS    Sig: Take 2 tablets by mouth 2 (two) times daily for 5 days. (Take nirmatrelvir 150 mg one tablet twice daily for 5 days and ritonavir 100 mg one tablet twice daily for 5 days) Patient GFR is 55    Dispense:  20 tablet    Refill:  0   nirmatrelvir/ritonavir, renal dosing, (PAXLOVID) 10 x 150 MG & 10 x 100MG  TABS    Sig: Take 2 tablets by mouth 2 (two) times daily for 5 days. (Take nirmatrelvir 150 mg one tablet twice daily for 5 days and ritonavir 100 mg one tablet twice  daily for 5 days) Patient GFR is 55    Dispense:  20 tablet    Refill:  0    Orders Placed This Encounter  Procedures   POC COVID-19 BinaxNow    Previously tested for COVID-19:   Yes    Resident in a congregate (group) care setting:   No    Employed in healthcare setting:   No    Pregnant:   No   POCT Influenza A/B    Patient Instructions  You tested positive for covid infection Take paxlovid 2 tablets twice daily for 5 days.  Continue vitamin C and D, drink plenty of fluids and rest.  Let us know if not improving with treatment.  Hold Rosuvastatin while on Paxlovid.   If unaffordable, sign up for discount at below website: [email or web address]   Follow up plan: No follow-ups on file.  Eustaquio Boyden, MD

## 2023-03-21 NOTE — Telephone Encounter (Signed)
Spoke with pt relaying Dr. G's message. Pt verbalizes understanding.  

## 2023-03-29 ENCOUNTER — Encounter: Payer: Self-pay | Admitting: Family Medicine

## 2023-03-29 ENCOUNTER — Ambulatory Visit (INDEPENDENT_AMBULATORY_CARE_PROVIDER_SITE_OTHER): Payer: PPO | Admitting: Family Medicine

## 2023-03-29 VITALS — BP 120/62 | HR 67 | Temp 98.3°F | Ht 63.0 in | Wt 141.4 lb

## 2023-03-29 DIAGNOSIS — R531 Weakness: Secondary | ICD-10-CM | POA: Diagnosis not present

## 2023-03-29 DIAGNOSIS — R053 Chronic cough: Secondary | ICD-10-CM

## 2023-03-29 DIAGNOSIS — U071 COVID-19: Secondary | ICD-10-CM | POA: Diagnosis not present

## 2023-03-29 LAB — CBC WITH DIFFERENTIAL/PLATELET
Basophils Absolute: 0 10*3/uL (ref 0.0–0.1)
Basophils Relative: 0.5 % (ref 0.0–3.0)
Eosinophils Absolute: 0.3 10*3/uL (ref 0.0–0.7)
Eosinophils Relative: 4.6 % (ref 0.0–5.0)
HCT: 36.6 % (ref 36.0–46.0)
Hemoglobin: 12.1 g/dL (ref 12.0–15.0)
Lymphocytes Relative: 16.3 % (ref 12.0–46.0)
Lymphs Abs: 1 10*3/uL (ref 0.7–4.0)
MCHC: 33.1 g/dL (ref 30.0–36.0)
MCV: 95.4 fL (ref 78.0–100.0)
Monocytes Absolute: 0.7 10*3/uL (ref 0.1–1.0)
Monocytes Relative: 12.4 % — ABNORMAL HIGH (ref 3.0–12.0)
Neutro Abs: 3.9 10*3/uL (ref 1.4–7.7)
Neutrophils Relative %: 66.2 % (ref 43.0–77.0)
Platelets: 152 10*3/uL (ref 150.0–400.0)
RBC: 3.84 Mil/uL — ABNORMAL LOW (ref 3.87–5.11)
RDW: 13.5 % (ref 11.5–15.5)
WBC: 5.8 10*3/uL (ref 4.0–10.5)

## 2023-03-29 LAB — COMPREHENSIVE METABOLIC PANEL
ALT: 11 U/L (ref 0–35)
AST: 18 U/L (ref 0–37)
Albumin: 3.6 g/dL (ref 3.5–5.2)
Alkaline Phosphatase: 65 U/L (ref 39–117)
BUN: 16 mg/dL (ref 6–23)
CO2: 28 meq/L (ref 19–32)
Calcium: 9 mg/dL (ref 8.4–10.5)
Chloride: 103 meq/L (ref 96–112)
Creatinine, Ser: 0.83 mg/dL (ref 0.40–1.20)
GFR: 63.5 mL/min (ref >60.00)
Glucose, Bld: 97 mg/dL (ref 70–99)
Potassium: 4.1 meq/L (ref 3.5–5.1)
Sodium: 138 meq/L (ref 135–145)
Total Bilirubin: 0.5 mg/dL (ref 0.2–1.2)
Total Protein: 7.4 g/dL (ref 6.0–8.3)

## 2023-03-29 MED ORDER — AZITHROMYCIN 250 MG PO TABS
ORAL_TABLET | ORAL | 0 refills | Status: AC
Start: 2023-03-29 — End: 2023-04-03

## 2023-03-29 MED ORDER — ONDANSETRON 4 MG PO TBDP: 4.0000 mg | ORAL_TABLET | Freq: Three times a day (TID) | ORAL | 0 refills | Status: AC | PRN

## 2023-03-29 NOTE — Telephone Encounter (Signed)
 I am not sure what the issue was with the scheduling,. We traditionally do the labs a week or so before the injection. I think it is wise to continue the injection

## 2023-03-29 NOTE — Progress Notes (Signed)
 Patient ID: Amy Lin, female    DOB: 09/24/1933, 89 y.o.   MRN: [id number]  This visit was conducted in person.  BP 120/62 (BP Location: Left Arm, Patient Position: Sitting, Cuff Size: Normal)   Pulse 67   Temp 98.3 F (36.8 C) (Temporal)   Ht 5\' 3"  (1.6 m)   Wt 141 lb 6 oz (64.1 kg)   SpO2 98%   BMI 25.04 kg/m    CC:  Chief Complaint  Patient presents with   Nausea    Seen by Dr. Reece Agar on 03/20/23 Covid Positive   Fatigue   Cough    With a lot of Phlegm   Nasal Congestion    Subjective:   HPI: Amy Lin is a 89 y.o. female presenting on 03/29/2023 for Nausea (Seen by Dr. Reece Agar on 03/20/23 Covid Positive), Fatigue, Cough (With a lot of Phlegm), and Nasal Congestion  Recent diagnosis of COVID in office visit with Dr. Reece Agar on February 17 She was treated with renal dosing of Paxlovid. She completed the course, rested.  Now on day 13 of illness.  Today she reports continued cough with progressively increasing phlegm, continued feel fatigue and nasal congestion... bloody, yellowish  mucus  She has deep productive cough.  She has noted mildly worsened SOB.  Sinus pressure,   Bilateral chronic ear pain  She is feeling very weak and tired, nausea when eating, decreased appetite.  No Vomiting, very soft stool, no diarrhea.   Drinking lots of liquids/electrolytes, moderate po intake   Using cough drops for cough.    She has history of coronary artery disease She is a non-smoker and has no chronic lung disease history such as asthma or COPD.  Relevant past medical, surgical, family and social history reviewed and updated as indicated. Interim medical history since our last visit reviewed. Allergies and medications reviewed and updated. Outpatient Medications Prior to Visit  Medication Sig Dispense Refill   aspirin 81 MG EC tablet Take 81 mg by mouth daily.     Cholecalciferol (VITAMIN D3) 2000 UNITS TABS Take 1 tablet by mouth every evening.     denosumab (PROLIA) 60 MG/ML  SOSY injection Inject 60 mg into the skin every 6 (six) months.     ezetimibe (ZETIA) 10 MG tablet Take 1 tablet (10 mg total) by mouth daily. 90 tablet 2   fluticasone (FLONASE) 50 MCG/ACT nasal spray PLACE ONE SPRAY INTO BOTH NOSTRILS DAILY 16 mL 2   furosemide (LASIX) 20 MG tablet Take 1 tablet (20 mg total) by mouth daily as needed for fluid or edema. 90 tablet 3   hydrocortisone 2.5 % lotion Apply topically 2 (two) times daily as needed.     levocetirizine (XYZAL) 5 MG tablet Take 5 mg by mouth every evening.     levothyroxine (SYNTHROID) 75 MCG tablet TAKE ONE TABLET BY MOUTH ONCE A DAY. TAKE ON AN EMPTY STOMACH WITH A GLASS OF WATER ATLEAST 30-60 MIN BEFORE BREAKFAST 90 tablet 1   lisinopril (ZESTRIL) 5 MG tablet TAKE ONE TABLET BY MOUTH ONCE A DAY 90 tablet PRN   nitroGLYCERIN (NITROSTAT) 0.4 MG SL tablet Place 1 tablet (0.4 mg total) under the tongue every 5 (five) minutes as needed for chest pain. 75 tablet 2   pantoprazole (PROTONIX) 20 MG tablet TAKE ONE TABLET BY MOUTH TWICE A DAY 180 tablet 2   potassium chloride (KLOR-CON M) 10 MEQ tablet Take 1 tablet (10 mEq total) by mouth daily. 90 tablet 3  rosuvastatin (CRESTOR) 20 MG tablet TAKE ONE TABLET BY MOUTH ONCE A DAY 90 tablet PRN   benzonatate (TESSALON) 200 MG capsule Take 1 capsule (200 mg total) by mouth 3 (three) times daily as needed for cough. 60 capsule 0   hydrocortisone 2.5 % lotion APPLY TOPICALLY TWO TIMES DAILY 59 mL 0   mupirocin ointment (BACTROBAN) 2 % Apply 1 Application topically 2 (two) times daily. 22 g 0   triamcinolone cream (KENALOG) 0.5 % Apply 1 Application topically 2 (two) times daily. 30 g 0   ondansetron (ZOFRAN-ODT) 4 MG disintegrating tablet Take 1 tablet (4 mg total) by mouth every 8 (eight) hours as needed for nausea or vomiting. (Patient not taking: Reported on 03/29/2023) 20 tablet 0   No facility-administered medications prior to visit.     Per HPI unless specifically indicated in ROS section  below Review of Systems  Constitutional:  Positive for fatigue. Negative for chills and fever.  HENT:  Positive for congestion and sinus pressure. Negative for sore throat.   Respiratory:  Positive for cough and shortness of breath. Negative for chest tightness and wheezing.    Objective:  BP 120/62 (BP Location: Left Arm, Patient Position: Sitting, Cuff Size: Normal)   Pulse 67   Temp 98.3 F (36.8 C) (Temporal)   Ht 5\' 3"  (1.6 m)   Wt 141 lb 6 oz (64.1 kg)   SpO2 98%   BMI 25.04 kg/m   Wt Readings from Last 3 Encounters:  03/29/23 141 lb 6 oz (64.1 kg)  03/20/23 145 lb 2 oz (65.8 kg)  01/23/23 140 lb (63.5 kg)      Physical Exam Constitutional:      General: She is not in acute distress.    Appearance: She is well-developed. She is ill-appearing. She is not toxic-appearing.  HENT:     Head: Normocephalic.     Right Ear: Hearing, tympanic membrane, ear canal and external ear normal. Tympanic membrane is not erythematous, retracted or bulging.     Left Ear: Hearing, tympanic membrane, ear canal and external ear normal. Tympanic membrane is not erythematous, retracted or bulging.     Nose: Mucosal edema and rhinorrhea present.     Right Sinus: No maxillary sinus tenderness or frontal sinus tenderness.     Left Sinus: No maxillary sinus tenderness or frontal sinus tenderness.     Mouth/Throat:     Pharynx: Uvula midline. No oropharyngeal exudate.  Eyes:     General: Lids are normal. Lids are everted, no foreign bodies appreciated.     Conjunctiva/sclera: Conjunctivae normal.     Pupils: Pupils are equal, round, and reactive to light.  Neck:     Thyroid: No thyroid mass or thyromegaly.     Vascular: No carotid bruit.     Trachea: Trachea normal.  Cardiovascular:     Rate and Rhythm: Normal rate and regular rhythm.     Pulses: Normal pulses.     Heart sounds: Normal heart sounds, S1 normal and S2 normal. No murmur heard.    No friction rub. No gallop.  Pulmonary:      Effort: Pulmonary effort is normal. No tachypnea or respiratory distress.     Breath sounds: Normal breath sounds. No decreased breath sounds, wheezing, rhonchi or rales.  Musculoskeletal:     Cervical back: Normal range of motion and neck supple.  Skin:    General: Skin is warm and dry.     Findings: No rash.  Neurological:  Mental Status: She is alert.  Psychiatric:        Mood and Affect: Mood is not anxious or depressed.        Speech: Speech normal.        Behavior: Behavior normal. Behavior is cooperative.        Judgment: Judgment normal.       Results for orders placed or performed in visit on 03/20/23  POC COVID-19 BinaxNow   Collection Time: 03/20/23  2:13 PM  Result Value Ref Range   SARS Coronavirus 2 Ag Positive (A) Negative  POCT Influenza A/B   Collection Time: 03/20/23  2:25 PM  Result Value Ref Range   Influenza A, POC Negative Negative   Influenza B, POC Negative Negative    Assessment and Plan  Weakness -     Comprehensive metabolic panel -     CBC with Differential/Platelet  COVID-19 virus infection  Persistent cough  Other orders -     Azithromycin; Take 2 tablets on day 1, then 1 tablet daily on days 2 through 5  Dispense: 6 tablet; Refill: 0 -     Ondansetron; Take 1 tablet (4 mg total) by mouth every 8 (eight) hours as needed for nausea or vomiting.  Dispense: 10 tablet; Refill: 0  COVID infection, day 13 of illness, with persistent cough and change in mucus color.  Clear lung exam with no suggestion of pneumonia or indication for chest x-ray.  Will treat with azithromycin taper.  No wheeze or indication for prednisone taper. Weakness likely secondary to decreased p.o. intake with COVID infection.  Will evaluate with complete metabolic panel for electrolyte disturbance or dehydration.  Blood pressure and heart rate normal on exam and not suggestive of acute dehydration. Recommend pushing fluids and normal p.o. intake.  She does have some nausea  limiting her so I will treat with Zofran as needed nausea.  Return and ER precautions reviewed with patient in detail.  No follow-ups on file.   Kerby Nora, MD

## 2023-03-29 NOTE — Telephone Encounter (Signed)
 Patient has been seen in office today. States she was never called to set up. When we last tried to set up appointment said she wanted to talk to PCP to schedule.

## 2023-04-05 ENCOUNTER — Ambulatory Visit: Payer: PPO | Admitting: Cardiology

## 2023-04-13 ENCOUNTER — Telehealth: Payer: Self-pay | Admitting: Cardiology

## 2023-04-13 NOTE — Telephone Encounter (Signed)
 Pt c/o of Chest Pain: STAT if active (IN THIS MOMENT) CP, including tightness, pressure, jaw pain, shoulder/upper arm/back pain, SOB, nausea, and vomiting.  1. Are you having CP right now (tightness, pressure, or discomfort)? No but last night pt woke up in the middle of the night from chest pain going to her head and jaws. She stated is was very painful in the jaws but then it all went away about 1 min later. It then happened again this morning and lasted about 45 seconds to 1 min again.   2. Are you experiencing any other symptoms (ex. SOB, nausea, vomiting, sweating)? No, but felt lightheaded and dizziness when it happened both times  3. How long have you been experiencing CP? Started last night and then happened again this morning.   4. Is your CP continuous or coming and going? Coming and going   5. Have you taken Nitroglycerin? No  6. If CP returns before callback, please consider calling 911. ?

## 2023-04-13 NOTE — Telephone Encounter (Signed)
 Noted. In case of any recurrent episodes of chest pain, strongly recommend ER evaluation.  Thanks MJP

## 2023-04-13 NOTE — Telephone Encounter (Signed)
 Pt is aware DrPatwardhan has reviewed information and no new orders.  She is aware to report to closest ED for evaluation if further cp and to keep upcoming appt on Monday as scheduled.

## 2023-04-13 NOTE — Telephone Encounter (Signed)
 Spoke with pt who reports being awakened from sleep last night night with jaw pain that radiated into her throat.  She reports feeling dizzy/lightheaded but only lasted a few minutes and she went back to sleep.  This morning while making breakfast she had another occurrence of jaw pain that was not as intense and only lasted a few seconds.  She felt slightly lightheaded but all s/s resolved on their own.  She denies any n/v, or SOB.  She has not had any more episodes so far today but has beentaking it easy.  Her jaw pain felt nothing like when she had STEMI 2022 per her report.  She feels her BP was elevated today for her nd took (1) extra Lisinopril 5 mg.  BP was 150/78.  She has not rechecked it.  Usually it is between 107/54 to 132/66 with HR from 54 to 80 bpm.  Reviewed ED/911 precautions and reviewed how to use SL Ntg if needed.  Pt has appt on Monday with Dr Arnell Sieving but is aware I will forward this to he and his nurse for review.

## 2023-04-17 ENCOUNTER — Ambulatory Visit: Payer: PPO | Attending: Cardiology | Admitting: Cardiology

## 2023-04-17 ENCOUNTER — Encounter: Payer: Self-pay | Admitting: Cardiology

## 2023-04-17 VITALS — BP 150/78 | HR 56 | Ht 63.0 in | Wt 143.3 lb

## 2023-04-17 DIAGNOSIS — R0609 Other forms of dyspnea: Secondary | ICD-10-CM

## 2023-04-17 DIAGNOSIS — I350 Nonrheumatic aortic (valve) stenosis: Secondary | ICD-10-CM | POA: Diagnosis not present

## 2023-04-17 DIAGNOSIS — I2729 Other secondary pulmonary hypertension: Secondary | ICD-10-CM | POA: Diagnosis not present

## 2023-04-17 DIAGNOSIS — E782 Mixed hyperlipidemia: Secondary | ICD-10-CM | POA: Diagnosis not present

## 2023-04-17 DIAGNOSIS — I25110 Atherosclerotic heart disease of native coronary artery with unstable angina pectoris: Secondary | ICD-10-CM

## 2023-04-17 LAB — CBC
Hematocrit: 36.4 % (ref 34.0–46.6)
Hemoglobin: 11.9 g/dL (ref 11.1–15.9)
MCH: 31.3 pg (ref 26.6–33.0)
MCHC: 32.7 g/dL (ref 31.5–35.7)
MCV: 96 fL (ref 79–97)
Platelets: 143 10*3/uL — ABNORMAL LOW (ref 150–450)
RBC: 3.8 x10E6/uL (ref 3.77–5.28)
RDW: 12.9 % (ref 11.7–15.4)
WBC: 6 10*3/uL (ref 3.4–10.8)

## 2023-04-17 LAB — BASIC METABOLIC PANEL
BUN/Creatinine Ratio: 20 (ref 12–28)
BUN: 20 mg/dL (ref 8–27)
CO2: 23 mmol/L (ref 20–29)
Calcium: 9.5 mg/dL (ref 8.7–10.3)
Chloride: 103 mmol/L (ref 96–106)
Creatinine, Ser: 1.02 mg/dL — ABNORMAL HIGH (ref 0.57–1.00)
Glucose: 104 mg/dL — ABNORMAL HIGH (ref 70–99)
Potassium: 4.6 mmol/L (ref 3.5–5.2)
Sodium: 140 mmol/L (ref 134–144)
eGFR: 53 mL/min/{1.73_m2} — ABNORMAL LOW (ref >59)

## 2023-04-17 LAB — LIPID PANEL
Chol/HDL Ratio: 2.5 ratio (ref 0.0–4.4)
Cholesterol, Total: 131 mg/dL (ref 100–199)
HDL: 52 mg/dL (ref >39)
LDL Chol Calc (NIH): 59 mg/dL (ref 0–99)
Triglycerides: 113 mg/dL (ref 0–149)
VLDL Cholesterol Cal: 20 mg/dL (ref 5–40)

## 2023-04-17 LAB — PRO B NATRIURETIC PEPTIDE: NT-Pro BNP: 283 pg/mL (ref 0–738)

## 2023-04-17 LAB — TROPONIN T: Troponin T (Highly Sensitive): 22 ng/L — ABNORMAL HIGH (ref 0–14)

## 2023-04-17 MED ORDER — NITROGLYCERIN 0.4 MG SL SUBL: 0.4000 mg | SUBLINGUAL_TABLET | SUBLINGUAL | 3 refills | Status: AC | PRN

## 2023-04-17 MED ORDER — ISOSORBIDE MONONITRATE ER 30 MG PO TB24: 30.0000 mg | ORAL_TABLET | Freq: Every day | ORAL | 3 refills | Status: DC

## 2023-04-17 MED ORDER — FUROSEMIDE 20 MG PO TABS: 20.0000 mg | ORAL_TABLET | Freq: Every day | ORAL | 3 refills | Status: DC

## 2023-04-17 NOTE — Progress Notes (Signed)
 Cardiology Office Note:  .   Date:  04/17/2023  ID:  Amy Lin, DOB 09-Mar-1933, MRN [id number] PCP: Eden Emms, NP  [facility] Providers Cardiologist:  Truett Mainland, MD PCP: Eden Emms, NP  Chief Complaint  Patient presents with   Chest Pain      History of Present Illness: .    Amy Lin is a 89 y.o. female with hypertension, hyperlipidemia, CAD, s/p culprit and non culprit vessel PCI after STEMI 2022, aortic stenosis, mild pulmonary hypertension  Patient has recently had episodes of severe jaw pain and chest tightness at rest.  One of these episodes woke her up from sleep, lasted for couple minutes and resolved on its own.  Another episode occurred next day when she was working at home, again resolved in few minutes.  She has not had any strenuous physical activity in last couple months owing to her serial illnesses with rotavirus and then COVID.  She has not been taking her Lasix regularly throughout this illness.  She has noticed mild bilateral leg swelling.  She underwent echocardiogram in 02/2023, returns to discuss the patient below.  She is also noticed her blood pressure has been spiking recently, previously was well-controlled.  Vitals:   04/17/23 1500  BP: (!) 150/78  Pulse: (!) 56  SpO2: 96%     ROS:  Review of Systems  Cardiovascular:  Positive for chest pain. Negative for dyspnea on exertion, leg swelling, palpitations and syncope.     Studies Reviewed: Marland Kitchen       EKG 04/17/2023: Sinus rhythm with marked sinus arrhythmia Low voltage QRS When compared with ECG of 07-Mar-2020 05:08, T wave inversion no longer evident in Inferior leads T wave inversion no longer evident in Anterolateral leads QT has shortened     Independently interpreted 03/2023: HbA1C % Cr 0.83  11/2022: Hb 12.4  Independently interpreted 07/2022: TG 137, HDL 94, LDL 82   05/2022: Chol 223, TG 138, HDL 46, LDL 149   Echocardiogram 03/16/2021: Left  ventricle cavity is normal in size. Moderate concentric hypertrophy of the left ventricle. Normal global wall motion. Normal LV systolic function with EF 58%. Doppler evidence of grade II (pseudonormal) diastolic dysfunction, elevated LAP. However, this assessment could be affected by moderate MAC.   Left atrial cavity is mildly dilated. Trileaflet aortic valve. Moderate aortic valve leaflet calcification. No evidence of aortic stenosis. Mild (Grade I) aortic regurgitation. Moderate aortic valve leaflet calcification. No evidence of mitral stenosis. Mild to moderate mitral regurgitation. Moderate mitral valve leaflet calcification. Mild to moderate tricuspid regurgitation. No evidence of pulmonary hypertension. Compared to previous study on 03/05/2020, LV wall motion abnormality now absent.     Coronary angioplasty to OM1 03/06/2020: OCT guided PTCA and stenting of the large OM1 with 2.5 x 15 mm resolute Onyx DES, stenosis reduced from 99% to 0%, >95% stent apposition and expansion.  No edge dissection.   Recommendation: Patient will hopefully be discharged home tomorrow if she remains stable.  She will need dual antiplatelet therapy for a year in view of ACS.  Continue risk factor modification is indicated.  115 mL contrast utilized.   Left Heart Catheterization and stenting to RCA 03/05/2020:  LV: Normal size.  Inferior akinesis.  LVEF 40%.  No significant MR.  Severe mitral annular calcification evident.  No pressure gradient across the aortic valve. LM: Large vessel, mildly calcified. CX: Very large vessel giving origin to large OM1 small OM 2 very large OM 3.  Ostium of the circumflex has a 30% stenosis.  Very large OM1 with high-grade and irregular marginal 95% stenosis in the proximal segment.  Moderate amount of calcification is evident in the coronary vessels.  Severe tortuosity is evident in the high OM1 and also large OM 3. LAD: Large caliber vessel giving origin to a moderate to  large size D1.  Mild luminal irregularities evident. RCA: Large vessel and dominant vessel.  Tortuous in the mid to distal segment.  It is occluded the proximal segment. Successful aspiration thrombectomy with Pronto V4 catheter followed by stenting with 4.0 x 30 mm resolute Onyx DES, stenosis reduced from 100% to 0% and TIMI 0 to TIMI-3 flow.      Physical Exam:   Physical Exam Vitals and nursing note reviewed.  Constitutional:      General: She is not in acute distress. Neck:     Vascular: No JVD.  Cardiovascular:     Rate and Rhythm: Normal rate and regular rhythm.     Heart sounds: Normal heart sounds. No murmur heard. Pulmonary:     Effort: Pulmonary effort is normal.     Breath sounds: Normal breath sounds. No wheezing or rales.  Musculoskeletal:     Right lower leg: Edema (1+) present.     Left lower leg: Edema (1+) present.      VISIT DIAGNOSES:   ICD-10-CM   1. Coronary artery disease involving native coronary artery of native heart with unstable angina pectoris (HCC)  I25.110 EKG 12-Lead    nitroGLYCERIN (NITROSTAT) 0.4 MG SL tablet    CBC    Basic metabolic panel    Lipid panel    Troponin T    2. Other secondary pulmonary hypertension (HCC)  I27.29 VAS US RENAL ARTERY DUPLEX    3. Nonrheumatic aortic valve stenosis  I35.0 ECHOCARDIOGRAM COMPLETE    4. Mixed hyperlipidemia  E78.2     5. Exertional dyspnea  R06.09 CBC    Basic metabolic panel    Lipid panel    Pro b natriuretic peptide (BNP)    Troponin T       ASSESSMENT AND PLAN: .    Amy Lin is a 89 y.o. female withhypertension, hyperlipidemia, CAD, s/p culprit and non culprit vessel PCI after STEMI 2022, aortic stenosis, mild pulmonary hypertension   CAD: Recent episodes of chest pain concerning for unstable angina.  She is also noted to have mild pulmonary hypertension on echocardiogram in 02/2023. EKG today does not show any acute ischemia. Check labs including CBC, BMP, stat troponin,  proBNP today. Recommend left and right heart catheterization with possible coronary intervention this Friday. Recommend no physical exertion until then. Continue aspirin 81 mg daily. Heart rate in 50s and 60s, therefore not on beta-blocker. Added Imdur 30 mg daily, and refilled sublingual nitroglycerin for as needed use. Continue senna 5 mg daily.  Hypertension: Recent spike in blood pressure readings, previously well-controlled.  \ Check renal artery duplex.  Aortic stenosis: Severe aortic calcification with mild aortic stenosis on echocardiogram 02/2023. Repeat echocardiogram in 02/2024.  Informed Consent   Shared Decision Making/Informed Consent The risks [stroke (1 in 1000), death (1 in 1000), kidney failure [usually temporary] (1 in 500), bleeding (1 in 200), allergic reaction [possibly serious] (1 in 200)], benefits (diagnostic support and management of coronary artery disease) and alternatives of a cardiac catheterization were discussed in detail with Ms. Recendiz and she is willing to proceed.       Meds ordered this encounter  Medications   nitroGLYCERIN (NITROSTAT) 0.4 MG SL tablet    Sig: Place 1 tablet (0.4 mg total) under the tongue every 5 (five) minutes as needed for chest pain.    Dispense:  30 tablet    Refill:  3   isosorbide mononitrate (IMDUR) 30 MG 24 hr tablet    Sig: Take 1 tablet (30 mg total) by mouth daily.    Dispense:  90 tablet    Refill:  3   furosemide (LASIX) 20 MG tablet    Sig: Take 1 tablet (20 mg total) by mouth daily.    Dispense:  90 tablet    Refill:  3     F/u after cath  Signed, Elder Negus, MD

## 2023-04-17 NOTE — Progress Notes (Signed)
 Marker of heart injury is mildly elevated. Chest/jaw pain event occurred a few days ago. If symptoms currently absent, we should continue with the current plan of heart catheterization on Friday. If there is any recurrence of similar symptoms, strongly recommend calling 911 immediately. Also, to be on safer side, we can repeat one more troponin T lab stat to ensure stability.  Thanks MJP

## 2023-04-17 NOTE — Patient Instructions (Addendum)
 Medication Instructions:  Your physician has recommended you make the following change in your medication:   1-Start take one lasix by mouth daily   2-START Imdur 30 mg by mouth take one tablet daily   *If you need a refill on your cardiac medications before your next appointment, please call your pharmacy*   Lab Work: CBC BMP PROBNP LIPID PANEL TROPONIN T (STAT)  If you have labs (blood work) drawn today and your tests are completely normal, you will receive your results only by: MyChart Message (if you have MyChart) OR A paper copy in the mail If you have any lab test that is abnormal or we need to change your treatment, we will call you to review the results.   Testing/Procedures: Your physician has requested that you have a cardiac catheterization. Cardiac catheterization is used to diagnose and/or treat various heart conditions. Doctors may recommend this procedure for a number of different reasons. The most common reason is to evaluate chest pain. Chest pain can be a symptom of coronary artery disease (CAD), and cardiac catheterization can show whether plaque is narrowing or blocking your heart's arteries. This procedure is also used to evaluate the valves, as well as measure the blood flow and oxygen levels in different parts of your heart. For further information please visit [email or web address] Please follow instruction sheet, as given.  Your physician has requested that you have an echocardiogram. Echocardiography is a painless test that uses sound waves to create images of your heart. It provides your doctor with information about the size and shape of your heart and how well your heart's chambers and valves are working. This procedure takes approximately one hour. There are no restrictions for this procedure. Please do NOT wear cologne, perfume, aftershave, or lotions (deodorant is allowed). Please arrive 15 minutes prior to your appointment time.  Please note: We ask at  that you not bring children with you during ultrasound (echo/ vascular) testing. Due to room size and safety concerns, children are not allowed in the ultrasound rooms during exams. Our front office staff cannot provide observation of children in our lobby area while testing is being conducted. An adult accompanying a patient to their appointment will only be allowed in the ultrasound room at the discretion of the ultrasound technician under special circumstances. We apologize for any inconvenience.   Your physician has requested that you have a renal artery duplex. During this test, an ultrasound is used to evaluate blood flow to the kidneys. Allow one hour for this exam. Do not eat after midnight the day before and avoid carbonated beverages. Take your medications as you usually do.   Follow-Up: At [facility], you and your health needs are our priority.  As part of our continuing mission to provide you with exceptional heart care, we have created designated Provider Care Teams.  These Care Teams include your primary Cardiologist (physician) and Advanced Practice Providers (APPs -  Physician Assistants and Nurse Practitioners) who all work together to provide you with the care you need, when you need it.  Your next appointment:   2 week(s) to 3 weeks   Provider:   Jari Favre, PA-C, Ronie Spies, PA-C, Robin Searing, NP, Jacolyn Reedy, PA-C, Eligha Bridegroom, NP, Tereso Newcomer, PA-C, or Perlie Gold, PA-C     Then, Elder Negus, MD will plan to see you again in 6 month(s).    Other Instructions  [facility] A DEPT OF . [facility]  [facility] AT [facility] [address] [place], [address] [place] [address] Dept: [phone] Loc: [phone]  Amy Lin  04/17/2023  You are scheduled for a Cardiac Catheterization on Friday, March 21 with Dr.  Rosemary Holms .  1. Please arrive at the [facility] (Main Entrance A) at [facility]:  [address] [place], [place] [address] at 11:30 AM (This time is 2 hour(s) before your procedure to ensure your preparation).   Free valet parking service is available. You will check in at ADMITTING. The support person will be asked to wait in the waiting room.  It is OK to have someone drop you off and come back when you are ready to be discharged.    Special note: Every effort is made to have your procedure done on time. Please understand that emergencies sometimes delay scheduled procedures.  2. Diet: Do not eat solid foods after midnight.  The patient may have clear liquids until 5am upon the day of the procedure.  3. Labs: You will need to have blood drawn on, March 17 at [facility].  4. Medication instructions in preparation for your procedure:   Contrast Allergy: No   On the morning of your procedure, take your Aspirin 81 mg and any morning medicines NOT listed above.  You may use sips of water.  5. Plan to go home the same day, you will only stay overnight if medically necessary. 6. Bring a current list of your medications and current insurance cards. 7. You MUST have a responsible person to drive you home. 8. Someone MUST be with you the first 24 hours after you arrive home or your discharge will be delayed. 9. Please wear clothes that are easy to get on and off and wear slip-on shoes.  Thank you for allowing [place] to care for you!   -- [facility]

## 2023-04-18 ENCOUNTER — Other Ambulatory Visit: Payer: Self-pay

## 2023-04-18 DIAGNOSIS — R7989 Other specified abnormal findings of blood chemistry: Secondary | ICD-10-CM

## 2023-04-18 LAB — TROPONIN T: Troponin T (Highly Sensitive): 39 ng/L — ABNORMAL HIGH (ref 0–14)

## 2023-04-18 NOTE — Progress Notes (Signed)
 I personally spoke with the patient over the phone.  She has not had any jaw pain or chest pain since last week.  2 troponins 24 hours apart are mildly elevated, with delta <20.  In absence of any ongoing symptoms, it is reasonable to continue current medical therapy, and plan for heart catheterization this Friday.  However, should patient have any recurrence of jaw pain, chest pain, chest tightness, new shortness of breath, palpitations, presyncope or syncope, she knows to call 911 immediately.  She is experiencing lightheadedness since starting Imdur.  I have asked her to hold Lasix until the procedure, and continue Imdur, along with liberal hydration.  Elder Negus, MD

## 2023-04-20 ENCOUNTER — Telehealth: Payer: Self-pay | Admitting: *Deleted

## 2023-04-20 NOTE — Telephone Encounter (Signed)
 Cardiac Catheterization scheduled at [facility] for: Friday April 21, 2023 1:30 PM Arrival time [facility] Main Entrance A at: 11:30 AM  Nothing to eat after midnight prior to procedure, clear liquids until 5 AM day of procedure.  Medication instructions: -Hold:  Lasix/KCl-on hold starting 04/18/23 per Dr Donaciano Eva note   Lisinopril-pt takes HS -will hold HS prior to procedure per protocol GFR <60 (53) -Other usual morning medications can be taken with sips of water including aspirin 81 mg.  Plan to go home the same day, you will only stay overnight if medically necessary.  You must have responsible adult to drive you home.  Someone must be with you the first 24 hours after you arrive home.  Reviewed procedure instructions with patient.

## 2023-04-21 ENCOUNTER — Ambulatory Visit (HOSPITAL_COMMUNITY)
Admission: RE | Admit: 2023-04-21 | Discharge: 2023-04-21 | Disposition: A | Discharge status: Home / Self Care | Attending: Cardiology | Admitting: Cardiology

## 2023-04-21 ENCOUNTER — Other Ambulatory Visit: Payer: Self-pay

## 2023-04-21 ENCOUNTER — Encounter (HOSPITAL_COMMUNITY)
Admission: RE | Disposition: A | Discharge status: Home / Self Care | Payer: Self-pay | Source: Home / Self Care | Attending: Cardiology

## 2023-04-21 ENCOUNTER — Other Ambulatory Visit: Payer: Self-pay | Admitting: Physician Assistant

## 2023-04-21 DIAGNOSIS — I272 Pulmonary hypertension, unspecified: Secondary | ICD-10-CM | POA: Diagnosis not present

## 2023-04-21 DIAGNOSIS — I10 Essential (primary) hypertension: Secondary | ICD-10-CM | POA: Diagnosis not present

## 2023-04-21 DIAGNOSIS — Z9861 Coronary angioplasty status: Secondary | ICD-10-CM

## 2023-04-21 DIAGNOSIS — I350 Nonrheumatic aortic (valve) stenosis: Secondary | ICD-10-CM | POA: Diagnosis not present

## 2023-04-21 DIAGNOSIS — Z79899 Other long term (current) drug therapy: Secondary | ICD-10-CM | POA: Diagnosis not present

## 2023-04-21 DIAGNOSIS — E785 Hyperlipidemia, unspecified: Secondary | ICD-10-CM | POA: Diagnosis not present

## 2023-04-21 DIAGNOSIS — Z955 Presence of coronary angioplasty implant and graft: Secondary | ICD-10-CM | POA: Diagnosis not present

## 2023-04-21 DIAGNOSIS — Z7902 Long term (current) use of antithrombotics/antiplatelets: Secondary | ICD-10-CM | POA: Diagnosis not present

## 2023-04-21 DIAGNOSIS — I252 Old myocardial infarction: Secondary | ICD-10-CM | POA: Diagnosis not present

## 2023-04-21 DIAGNOSIS — I25110 Atherosclerotic heart disease of native coronary artery with unstable angina pectoris: Secondary | ICD-10-CM | POA: Diagnosis not present

## 2023-04-21 HISTORY — PX: CORONARY STENT INTERVENTION: CATH118234

## 2023-04-21 HISTORY — PX: CORONARY IMAGING/OCT: CATH118326

## 2023-04-21 HISTORY — PX: LEFT HEART CATH AND CORONARY ANGIOGRAPHY: CATH118249

## 2023-04-21 LAB — POCT ACTIVATED CLOTTING TIME
Activated Clotting Time: 239 s
Activated Clotting Time: 279 s

## 2023-04-21 MED ORDER — IOHEXOL 350 MG/ML SOLN
INTRAVENOUS | Status: DC | PRN
Start: 2023-04-21 — End: 2023-04-22
  Administered 2023-04-21: 140 mL

## 2023-04-21 MED ORDER — SODIUM CHLORIDE 0.9 % IV SOLN: INTRAVENOUS | Status: AC

## 2023-04-21 MED ORDER — SODIUM CHLORIDE 0.9 % IV SOLN: 250.0000 mL | INTRAVENOUS | Status: DC | PRN

## 2023-04-21 MED ORDER — VERAPAMIL HCL 2.5 MG/ML IV SOLN
INTRAVENOUS | Status: AC
  Filled 2023-04-21: qty 2

## 2023-04-21 MED ORDER — ONDANSETRON HCL 4 MG/2ML IJ SOLN: 4.0000 mg | Freq: Four times a day (QID) | INTRAMUSCULAR | Status: DC | PRN

## 2023-04-21 MED ORDER — HEPARIN SODIUM (PORCINE) 1000 UNIT/ML IJ SOLN
INTRAMUSCULAR | Status: DC | PRN
  Administered 2023-04-21 (×2): 4000 [IU] via INTRAVENOUS
  Administered 2023-04-21: 2000 [IU] via INTRAVENOUS

## 2023-04-21 MED ORDER — LIDOCAINE HCL (PF) 1 % IJ SOLN
INTRAMUSCULAR | Status: AC
  Filled 2023-04-21: qty 30

## 2023-04-21 MED ORDER — HYDRALAZINE HCL 20 MG/ML IJ SOLN: 10.0000 mg | INTRAMUSCULAR | Status: AC | PRN

## 2023-04-21 MED ORDER — LABETALOL HCL 5 MG/ML IV SOLN
10.0000 mg | INTRAVENOUS | Status: AC | PRN
  Administered 2023-04-21: 10 mg via INTRAVENOUS

## 2023-04-21 MED ORDER — EZETIMIBE 10 MG PO TABS: 10.0000 mg | ORAL_TABLET | Freq: Every day | ORAL | Status: DC

## 2023-04-21 MED ORDER — SODIUM CHLORIDE 0.9% FLUSH: 3.0000 mL | INTRAVENOUS | Status: DC | PRN

## 2023-04-21 MED ORDER — CLOPIDOGREL BISULFATE 300 MG PO TABS
ORAL_TABLET | ORAL | Status: AC
  Filled 2023-04-21: qty 2

## 2023-04-21 MED ORDER — SODIUM CHLORIDE 0.9 % WEIGHT BASED INFUSION
3.0000 mL/kg/h | INTRAVENOUS | Status: AC
  Administered 2023-04-21: 3 mL/kg/h via INTRAVENOUS

## 2023-04-21 MED ORDER — CLOPIDOGREL BISULFATE 75 MG PO TABS: 75.0000 mg | ORAL_TABLET | Freq: Every day | ORAL | 3 refills | Status: AC

## 2023-04-21 MED ORDER — MIDAZOLAM HCL 2 MG/2ML IJ SOLN
INTRAMUSCULAR | Status: AC
  Filled 2023-04-21: qty 2

## 2023-04-21 MED ORDER — FAMOTIDINE IN NACL 20-0.9 MG/50ML-% IV SOLN
INTRAVENOUS | Status: DC
Start: 2023-04-21 — End: 2023-04-22
  Filled 2023-04-21: qty 50

## 2023-04-21 MED ORDER — ASPIRIN 81 MG PO CHEW: 81.0000 mg | CHEWABLE_TABLET | ORAL | Status: DC

## 2023-04-21 MED ORDER — SODIUM CHLORIDE 0.9% FLUSH: 3.0000 mL | Freq: Two times a day (BID) | INTRAVENOUS | Status: DC

## 2023-04-21 MED ORDER — HEPARIN SODIUM (PORCINE) 1000 UNIT/ML IJ SOLN
INTRAMUSCULAR | Status: AC
  Filled 2023-04-21: qty 10

## 2023-04-21 MED ORDER — ACETAMINOPHEN 325 MG PO TABS: 650.0000 mg | ORAL_TABLET | ORAL | Status: DC | PRN

## 2023-04-21 MED ORDER — ROSUVASTATIN CALCIUM 20 MG PO TABS: 20.0000 mg | ORAL_TABLET | Freq: Every evening | ORAL | Status: DC

## 2023-04-21 MED ORDER — CLOPIDOGREL BISULFATE 300 MG PO TABS
ORAL_TABLET | ORAL | Status: DC | PRN
  Administered 2023-04-21: 600 mg via ORAL

## 2023-04-21 MED ORDER — MIDAZOLAM HCL 2 MG/2ML IJ SOLN
INTRAMUSCULAR | Status: DC | PRN
  Administered 2023-04-21 (×2): 1 mg via INTRAVENOUS

## 2023-04-21 MED ORDER — LABETALOL HCL 5 MG/ML IV SOLN
INTRAVENOUS | Status: AC
  Filled 2023-04-21: qty 4

## 2023-04-21 MED ORDER — LIDOCAINE HCL (PF) 1 % IJ SOLN
INTRAMUSCULAR | Status: DC | PRN
  Administered 2023-04-21: 2 mL

## 2023-04-21 MED ORDER — FENTANYL CITRATE (PF) 100 MCG/2ML IJ SOLN
INTRAMUSCULAR | Status: AC
  Filled 2023-04-21: qty 2

## 2023-04-21 MED ORDER — CLOPIDOGREL BISULFATE 75 MG PO TABS: 75.0000 mg | ORAL_TABLET | Freq: Every day | ORAL | Status: DC

## 2023-04-21 MED ORDER — VERAPAMIL HCL 2.5 MG/ML IV SOLN
INTRAVENOUS | Status: DC | PRN
  Administered 2023-04-21: 10 mL via INTRA_ARTERIAL

## 2023-04-21 MED ORDER — ASPIRIN 81 MG PO TBEC: 81.0000 mg | DELAYED_RELEASE_TABLET | Freq: Every day | ORAL | Status: DC

## 2023-04-21 MED ORDER — PANTOPRAZOLE SODIUM 20 MG PO TBEC: 20.0000 mg | DELAYED_RELEASE_TABLET | Freq: Two times a day (BID) | ORAL | Status: DC

## 2023-04-21 MED ORDER — NITROGLYCERIN 1 MG/10 ML FOR IR/CATH LAB
INTRA_ARTERIAL | Status: DC | PRN
  Administered 2023-04-21: 100 ug via INTRACORONARY
  Administered 2023-04-21 (×4): 200 ug via INTRACORONARY

## 2023-04-21 MED ORDER — SODIUM CHLORIDE 0.9 % WEIGHT BASED INFUSION: 1.0000 mL/kg/h | INTRAVENOUS | Status: DC

## 2023-04-21 MED ORDER — FENTANYL CITRATE (PF) 100 MCG/2ML IJ SOLN
INTRAMUSCULAR | Status: DC | PRN
  Administered 2023-04-21 (×3): 25 ug via INTRAVENOUS

## 2023-04-21 MED ORDER — NITROGLYCERIN 1 MG/10 ML FOR IR/CATH LAB
INTRA_ARTERIAL | Status: DC
Start: 2023-04-21 — End: 2023-04-22
  Filled 2023-04-21: qty 10

## 2023-04-21 MED ORDER — HEPARIN (PORCINE) IN NACL 1000-0.9 UT/500ML-% IV SOLN
INTRAVENOUS | Status: DC | PRN
  Administered 2023-04-21 (×2): 500 mL

## 2023-04-21 MED ORDER — FAMOTIDINE IN NACL 20-0.9 MG/50ML-% IV SOLN
INTRAVENOUS | Status: DC | PRN
Start: 2023-04-21 — End: 2023-04-22
  Administered 2023-04-21: 20 mg via INTRAVENOUS

## 2023-04-21 NOTE — Progress Notes (Signed)
 HAO, PA , at bedside talking w/patient, reviewing discharge instructions w/her.

## 2023-04-21 NOTE — Discharge Instructions (Addendum)
 No lifting over 5 lbs for 1 week. No sexual activity for 1 week. Keep procedure site clean & dry. If you notice increased pain, swelling, bleeding or pus, call/return!  You may shower, but no soaking baths/hot tubs/pools for 1 week

## 2023-04-21 NOTE — Interval H&P Note (Signed)
 History and Physical Interval Note:  04/21/2023 12:35 PM  Amy Lin  has presented today for surgery, with the diagnosis of unstable angina.  The various methods of treatment have been discussed with the patient and family. After consideration of risks, benefits and other options for treatment, the patient has consented to  Procedure(s): LEFT HEART CATH AND CORONARY ANGIOGRAPHY (N/A) as a surgical intervention.  The patient's history has been reviewed, patient examined, no change in status, stable for surgery.  I have reviewed the patient's chart and labs.  Questions were answered to the patient's satisfaction.     Rochel Privett J Kishia Shackett

## 2023-04-21 NOTE — Progress Notes (Addendum)
 TR BAND REMOVAL  LOCATION:     right radial  DEFLATED PER PROTOCOL:   yes  TIME BAND OFF / DRESSING APPLIED:    2000/gauze and tegaderm  SITE UPON ARRIVAL:    Level 0  SITE AFTER BAND REMOVAL:    Level 0, very faint localized bruising , no swelling.  CIRCULATION SENSATION AND MOVEMENT:    Within Normal Limits : yes. Rt radial pulse 2+ palpable, sensation present, good pleth waveform  COMMENTS:   Instructions reviewed w/patient

## 2023-04-21 NOTE — Discharge Summary (Addendum)
 Discharge Summary for Same Day PCI   Patient ID: Amy Lin MRN: [id number]; DOB: 27-Jun-1933  Admit date: 04/21/2023 Discharge date: 04/21/2023  Primary Care Provider: Eden Emms, NP  Primary Cardiologist: Elder Negus, MD  Primary Electrophysiologist:  None   Discharge Diagnoses    Principal Problem:   Post PTCA    Diagnostic Studies/Procedures    Cardiac Catheterization 04/21/2023:  Coronary angiography and intervention 04/21/2023: LM: Normal LAD: No significant disease Lcx: Prox mild 30% disease (Unchanged since 2022) Ramus: Patent prox stent, no significant stenosis, following a 95% stenosis RCA: Large dominant vessel, patent stents with no significant disease           Rt PDA 30% disease   LVEDP 13 mmHg   Successful percutaneous coronary intervention ramus        OCT guided PTCA and stent placement 2.5 X 8 mm Onyx drug-eluting stent        Post dilatation using 2.5 X 6 mm [place] balloon up to 18 atm      _____________   History of Present Illness     Amy Lin is a 89 y.o. female with hypertension, hyperlipidemia, CAD, s/p culprit and non culprit vessel PCI after STEMI 2022, aortic stenosis, mild pulmonary hypertension   Patient has recently had episodes of severe jaw pain and chest tightness at rest.  One of these episodes woke her up from sleep, lasted for couple minutes and resolved on its own.  Another episode occurred next day when she was working at home, again resolved in few minutes.  She has not had any strenuous physical activity in last couple months owing to her serial illnesses with rotavirus and then COVID.  She has not been taking her Lasix regularly throughout this illness.  She has noticed mild bilateral leg swelling.  She underwent echocardiogram in 02/2023, returns to discuss the patient below.  She is also noticed her blood pressure has been spiking recently, previously was well-controlled..   Cardiac catheterization was arranged for  further evaluation.  Hospital Course     The patient underwent cardiac cath as noted above with 95% prox ramus lesion. Plan for DAPT with ASA/plavix for at least 6-12 month. The patient was seen by cardiac rehab while in short stay. There were no observed complications post cath. Radial cath site was re-evaluated prior to discharge and found to be stable without any complications. Instructions/precautions regarding cath site care were given prior to discharge.  Linde Amy Lin was seen by Dr. Rosemary Holms and me and determined stable for discharge home. Follow up with our office has been arranged. Medications are listed below. Pertinent changes include addition of plavix. The original plan was to keep the patient overnight, however patient prefers to go home as she has prior history of delirium and sundowning while staying in the hospital. There is no bed on 6E either, therefore eventually Dr. Rosemary Holms is ok with her being discharged today. Per Dr. Rosemary Holms the situation was discussed with her daughter who was agreeable with the plan.    _____________  Cath/PCI Registry Performance & Quality Measures: Aspirin prescribed? - Yes ADP Receptor Inhibitor (Plavix/Clopidogrel, Brilinta/Ticagrelor or Effient/Prasugrel) prescribed (includes medically managed patients)? - Yes High Intensity Statin (Lipitor 40-80mg  or Crestor 20-40mg ) prescribed? - Yes For EF <40%, was ACEI/ARB prescribed? - Not Applicable (EF >/= 40%) For EF <40%, Aldosterone Antagonist (Spironolactone or Eplerenone) prescribed? - Not Applicable (EF >/= 40%) Cardiac Rehab Phase II ordered (Included Medically managed Patients)? -  Yes  _____________   Discharge Vitals Blood pressure 131/68, pulse (!) 52, temperature 97.6 F (36.4 C), temperature source Oral, resp. rate 16, height 5\' 3"  (1.6 m), weight 64.9 kg, SpO2 97%.  Filed Weights   04/21/23 1133  Weight: 64.9 kg    Last Labs & Radiologic Studies    CBC No results for  input(s): "WBC", "NEUTROABS", "HGB", "HCT", "MCV", "PLT" in the last 72 hours. Basic Metabolic Panel No results for input(s): "NA", "K", "CL", "CO2", "GLUCOSE", "BUN", "CREATININE", "CALCIUM", "MG", "PHOS" in the last 72 hours. Liver Function Tests No results for input(s): "AST", "ALT", "ALKPHOS", "BILITOT", "PROT", "ALBUMIN" in the last 72 hours. No results for input(s): "LIPASE", "AMYLASE" in the last 72 hours. High Sensitivity Troponin:   No results for input(s): "TROPONINIHS" in the last 720 hours.  BNP Invalid input(s): "POCBNP" D-Dimer No results for input(s): "DDIMER" in the last 72 hours. Hemoglobin A1C No results for input(s): "HGBA1C" in the last 72 hours. Fasting Lipid Panel No results for input(s): "CHOL", "HDL", "LDLCALC", "TRIG", "CHOLHDL", "LDLDIRECT" in the last 72 hours. Thyroid Function Tests No results for input(s): "TSH", "T4TOTAL", "T3FREE", "THYROIDAB" in the last 72 hours.  Invalid input(s): "FREET3" _____________  CARDIAC CATHETERIZATION Result Date: 04/21/2023 Images from the original result were not included. Coronary angiography and intervention 04/21/2023: LM: Normal LAD: No significant disease Lcx: Prox mild 30% disease (Unchanged since 2022) Ramus: Patent prox stent, no significant stenosis, following a 95% stenosis RCA: Large dominant vessel, patent stents with no significant disease           Rt PDA 30% disease LVEDP 13 mmHg Successful percutaneous coronary intervention ramus        OCT guided PTCA and stent placement 2.5 X 8 mm Onyx drug-eluting stent        Post dilatation using 2.5 X 6 mm [place] balloon up to 18 atm Patient had up to 6/10 pain during the PCI, down to 1/20 at the end. She is 89, lives in [place]. We will keep her overnight and discharge in AM. Will need to monitor for any hospital delirium. Manish Emiliano Dyer, MD   Disposition   Pt is being discharged home today in good condition.  Follow-up Plans & Appointments     Follow-up  Information     Elder Negus, MD Follow up on 05/04/2023.   Specialties: Cardiology, Radiology Why: 11:40AM. Cardiology follow up Contact information: [address] [place] [address] [phone]                Discharge Instructions     AMB Referral to Cardiac Rehabilitation - Phase II   Complete by: As directed    Diagnosis: Coronary Stents   After initial evaluation and assessments completed: Virtual Based Care may be provided alone or in conjunction with Phase 2 Cardiac Rehab based on patient barriers.: Yes   Intensive Cardiac Rehabilitation (ICR) [facility] location only OR Traditional Cardiac Rehabilitation (TCR) *If criteria for ICR are not met will enroll in TCR [facility] only): Yes   Diet - low sodium heart healthy   Complete by: As directed    Increase activity slowly   Complete by: As directed         Discharge Medications   Allergies as of 04/21/2023   No Known Allergies      Medication List     TAKE these medications    aspirin EC 81 MG tablet Take 81 mg by mouth daily before breakfast.  clopidogrel 75 MG tablet Commonly known as: PLAVIX Take 1 tablet (75 mg total) by mouth daily with breakfast. Start taking on: April 22, 2023   ezetimibe 10 MG tablet Commonly known as: ZETIA Take 1 tablet (10 mg total) by mouth daily. What changed: when to take this   fluticasone 50 MCG/ACT nasal spray Commonly known as: FLONASE PLACE ONE SPRAY INTO BOTH NOSTRILS DAILY What changed: See the new instructions.   furosemide 20 MG tablet Commonly known as: LASIX Take 1 tablet (20 mg total) by mouth daily.   hydrocortisone 2.5 % lotion Apply 1 Application topically 2 (two) times daily as needed (bug bites/skin irritation.).   isosorbide mononitrate 30 MG 24 hr tablet Commonly known as: IMDUR Take 1 tablet (30 mg total) by mouth daily. What changed: when to take this   levocetirizine 5 MG tablet Commonly known as: XYZAL Take 5 mg  by mouth daily as needed for allergies.   levothyroxine 75 MCG tablet Commonly known as: SYNTHROID TAKE ONE TABLET BY MOUTH ONCE A DAY. TAKE ON AN EMPTY STOMACH WITH A GLASS OF WATER ATLEAST 30-60 MIN BEFORE BREAKFAST   lisinopril 5 MG tablet Commonly known as: ZESTRIL TAKE ONE TABLET BY MOUTH ONCE A DAY What changed: when to take this   nitroGLYCERIN 0.4 MG SL tablet Commonly known as: NITROSTAT Place 1 tablet (0.4 mg total) under the tongue every 5 (five) minutes as needed for chest pain.   ondansetron 4 MG disintegrating tablet Commonly known as: ZOFRAN-ODT Take 1 tablet (4 mg total) by mouth every 8 (eight) hours as needed for nausea or vomiting.   Opcon-A 0.027-0.315 % Soln Generic drug: Naphazoline-Pheniramine Place 1-2 drops into both eyes 3 (three) times daily as needed (irritated/red eyes).   pantoprazole 20 MG tablet Commonly known as: PROTONIX TAKE ONE TABLET BY MOUTH TWICE A DAY   potassium chloride 10 MEQ tablet Commonly known as: KLOR-CON M Take 1 tablet (10 mEq total) by mouth daily.   PRESERVISION AREDS 2 PO Take 1 capsule by mouth in the morning and at bedtime.   Prolia 60 MG/ML Sosy injection Generic drug: denosumab Inject 60 mg into the skin every 6 (six) months.   rosuvastatin 20 MG tablet Commonly known as: CRESTOR TAKE ONE TABLET BY MOUTH ONCE A DAY What changed: when to take this   Vitamin D3 50 MCG (2000 UT) Tabs Take 2,000 Units by mouth every evening.           Allergies No Known Allergies  Outstanding Labs/Studies   N/A  Duration of Discharge Encounter   Greater than 30 minutes including physician time.  Ramond Dial, PA 04/21/2023, 7:41 PM

## 2023-04-21 NOTE — H&P (Signed)
 Cardiology Office Note:  .   Date:  04/21/2023  ID:  [name], DOB [birth year], MRN [id number] PCP: Eden Emms, NP  [facility] Providers Cardiologist:  Truett Mainland, MD PCP: Eden Emms, NP  C/C: Chest pain     History of Present Illness: .    CAYLE CORDOBA is a 89 y.o. female with hypertension, hyperlipidemia, CAD, s/p culprit and non culprit vessel PCI after STEMI 2022, aortic stenosis, mild pulmonary hypertension  Patient has recently had episodes of severe jaw pain and chest tightness at rest.  One of these episodes woke her up from sleep, lasted for couple minutes and resolved on its own.  Another episode occurred next day when she was working at home, again resolved in few minutes.  She has not had any strenuous physical activity in last couple months owing to her serial illnesses with rotavirus and then COVID.  She has not been taking her Lasix regularly throughout this illness.  She has noticed mild bilateral leg swelling.  She underwent echocardiogram in 02/2023, returns to discuss the patient below.  She is also noticed her blood pressure has been spiking recently, previously was well-controlled.  Vitals:   04/21/23 1133  BP: (!) 148/69  Pulse: 72  Resp: 20  Temp: 97.6 F (36.4 C)  SpO2: 99%     ROS:  Review of Systems  Cardiovascular:  Positive for chest pain. Negative for dyspnea on exertion, leg swelling, palpitations and syncope.     Studies Reviewed: Marland Kitchen       EKG 04/17/2023: Sinus rhythm with marked sinus arrhythmia Low voltage QRS When compared with ECG of 07-Mar-2020 05:08, T wave inversion no longer evident in Inferior leads T wave inversion no longer evident in Anterolateral leads QT has shortened     Independently interpreted 03/2023: HbA1C % Cr 0.83  11/2022: Hb 12.4  Independently interpreted 07/2022: TG 137, HDL 94, LDL 82   05/2022: Chol 223, TG 138, HDL 46, LDL 149   Echocardiogram 03/16/2021: Left ventricle  cavity is normal in size. Moderate concentric hypertrophy of the left ventricle. Normal global wall motion. Normal LV systolic function with EF 58%. Doppler evidence of grade II (pseudonormal) diastolic dysfunction, elevated LAP. However, this assessment could be affected by moderate MAC.   Left atrial cavity is mildly dilated. Trileaflet aortic valve. Moderate aortic valve leaflet calcification. No evidence of aortic stenosis. Mild (Grade I) aortic regurgitation. Moderate aortic valve leaflet calcification. No evidence of mitral stenosis. Mild to moderate mitral regurgitation. Moderate mitral valve leaflet calcification. Mild to moderate tricuspid regurgitation. No evidence of pulmonary hypertension. Compared to previous study on 03/05/2020, LV wall motion abnormality now absent.     Coronary angioplasty to OM1 03/06/2020: OCT guided PTCA and stenting of the large OM1 with 2.5 x 15 mm resolute Onyx DES, stenosis reduced from 99% to 0%, >95% stent apposition and expansion.  No edge dissection.   Recommendation: Patient will hopefully be discharged home tomorrow if she remains stable.  She will need dual antiplatelet therapy for a year in view of ACS.  Continue risk factor modification is indicated.  115 mL contrast utilized.   Left Heart Catheterization and stenting to RCA 03/05/2020:  LV: Normal size.  Inferior akinesis.  LVEF 40%.  No significant MR.  Severe mitral annular calcification evident.  No pressure gradient across the aortic valve. LM: Large vessel, mildly calcified. CX: Very large vessel giving origin to large OM1 small OM 2 very large OM 3.  Ostium of the circumflex has a 30% stenosis.  Very large OM1 with high-grade and irregular marginal 95% stenosis in the proximal segment.  Moderate amount of calcification is evident in the coronary vessels.  Severe tortuosity is evident in the high OM1 and also large OM 3. LAD: Large caliber vessel giving origin to a moderate to large size  D1.  Mild luminal irregularities evident. RCA: Large vessel and dominant vessel.  Tortuous in the mid to distal segment.  It is occluded the proximal segment. Successful aspiration thrombectomy with Pronto V4 catheter followed by stenting with 4.0 x 30 mm resolute Onyx DES, stenosis reduced from 100% to 0% and TIMI 0 to TIMI-3 flow.      Physical Exam:   Physical Exam Vitals and nursing note reviewed.  Constitutional:      General: She is not in acute distress. Neck:     Vascular: No JVD.  Cardiovascular:     Rate and Rhythm: Normal rate and regular rhythm.     Heart sounds: Normal heart sounds. No murmur heard. Pulmonary:     Effort: Pulmonary effort is normal.     Breath sounds: Normal breath sounds. No wheezing or rales.  Musculoskeletal:     Right lower leg: Edema (1+) present.     Left lower leg: Edema (1+) present.      VISIT DIAGNOSES: No diagnosis found.    ASSESSMENT AND PLAN: .    MCKAILA DUFFUS is a 89 y.o. female withhypertension, hyperlipidemia, CAD, s/p culprit and non culprit vessel PCI after STEMI 2022, aortic stenosis, mild pulmonary hypertension   CAD: Recent episodes of chest pain concerning for unstable angina.  She is also noted to have mild pulmonary hypertension on echocardiogram in 02/2023. EKG today does not show any acute ischemia. Check labs including CBC, BMP, stat troponin, proBNP today. Recommend left and right heart catheterization with possible coronary intervention this Friday. Recommend no physical exertion until then. Continue aspirin 81 mg daily. Heart rate in 50s and 60s, therefore not on beta-blocker. Added Imdur 30 mg daily, and refilled sublingual nitroglycerin for as needed use. Continue senna 5 mg daily.  Hypertension: Recent spike in blood pressure readings, previously well-controlled.  \ Check renal artery duplex.  Aortic stenosis: Severe aortic calcification with mild aortic stenosis on echocardiogram 02/2023. Repeat  echocardiogram in 02/2024.  Informed Consent   Shared Decision Making/Informed Consent The risks [stroke (1 in 1000), death (1 in 1000), kidney failure [usually temporary] (1 in 500), bleeding (1 in 200), allergic reaction [possibly serious] (1 in 200)], benefits (diagnostic support and management of coronary artery disease) and alternatives of a cardiac catheterization were discussed in detail with Ms. Eagleton and she is willing to proceed.       Meds ordered this encounter  Medications   aspirin chewable tablet 81 mg   FOLLOWED BY Linked Order Group    0.9% sodium chloride infusion    0.9% sodium chloride infusion     F/u after cath  Signed, Elder Negus, MD

## 2023-04-24 ENCOUNTER — Encounter (HOSPITAL_COMMUNITY): Payer: Self-pay | Admitting: Cardiology

## 2023-04-24 NOTE — Progress Notes (Signed)
 CARDIAC REHAB PHASE I     Post stent education including site care, restrictions, risk factors, exercise guidelines, NTG use, antiplatelet therapy importance, heart healthy diet and CRP2 reviewed via telephone. All questions and concerns addressed. Will refer to [facility] for CRP2.     Woodroe Chen, RN BSN 04/24/2023 1:42 PM

## 2023-04-25 ENCOUNTER — Other Ambulatory Visit: Payer: Self-pay | Admitting: Nurse Practitioner

## 2023-05-04 ENCOUNTER — Ambulatory Visit: Attending: Cardiology | Admitting: Cardiology

## 2023-05-04 ENCOUNTER — Encounter: Payer: Self-pay | Admitting: Cardiology

## 2023-05-04 VITALS — BP 122/70 | HR 71 | Resp 16 | Ht 63.0 in | Wt 142.2 lb

## 2023-05-04 DIAGNOSIS — I350 Nonrheumatic aortic (valve) stenosis: Secondary | ICD-10-CM | POA: Diagnosis not present

## 2023-05-04 DIAGNOSIS — I2510 Atherosclerotic heart disease of native coronary artery without angina pectoris: Secondary | ICD-10-CM

## 2023-05-04 MED ORDER — FUROSEMIDE 20 MG PO TABS: 20.0000 mg | ORAL_TABLET | ORAL | 3 refills | Status: AC | PRN

## 2023-05-04 MED ORDER — POTASSIUM CHLORIDE CRYS ER 10 MEQ PO TBCR: 10.0000 meq | EXTENDED_RELEASE_TABLET | Freq: Every day | ORAL | 3 refills | Status: AC | PRN

## 2023-05-04 NOTE — Progress Notes (Signed)
 Cardiology Office Note:  .   Date:  05/04/2023  ID:  [name], DOB 04/05/1933, MRN [id number] PCP: Eden Emms, NP  [facility] Providers Cardiologist:  Truett Mainland, MD PCP: Eden Emms, NP  Chief Complaint  Patient presents with   Coronary artery disease involving native coronary artery of   Follow-up      History of Present Illness: .    TAL NEER is a 89 y.o. female with hypertension, hyperlipidemia, CAD, aortic stenosis  Patient underwent PCI to ostial ramus due to symptoms of unstable angina and mild troponin elevation (04/2023)  Patient is doing well since PCI, not having any chest pain.  Last week, blood pressure was noted with SBP in 80s.  She did not have any presyncope or syncope symptoms.  With the tach, blood pressure has been high.  She has been doing duplex ultrasound.  Patient has noticed minimal " knot" like swelling in her right wrist catheterization.  Vitals:   05/04/23 1135  BP: 122/70  Pulse: 71  Resp: 16  SpO2: 97%      ROS:  Review of Systems  Cardiovascular:  Negative for chest pain, dyspnea on exertion, leg swelling, palpitations and syncope.  Neurological:  Positive for light-headedness.     Studies Reviewed: Marland Kitchen       EKG 05/04/2023: Sinus rhythm with marked sinus arrhythmia When compared with ECG of 21-Apr-2023 14:36, No significant change was found       Coronary angiography and intervention 04/21/2023: LM: Normal LAD: No significant disease Lcx: Prox mild 30% disease (Unchanged since 2022) Ramus: Patent prox stent, no significant stenosis, following a 95% stenosis RCA: Large dominant vessel, patent stents with no significant disease           Rt PDA 30% disease   LVEDP 13 mmHg   Successful percutaneous coronary intervention ramus        OCT guided PTCA and stent placement 2.5 X 8 mm Onyx drug-eluting stent        Post dilatation using 2.5 X 6 mm [place] balloon up to 18 atm     Independently  interpreted 04/2023: Chol 131, TG 113, HDL 52, LDL 59 Hb 11.9 Cr 4.33  03/2023: HbA1C % Cr 0.83  11/2022: Hb 12.4  Echocardiogram 03/02/2023: 1. Left ventricular ejection fraction, by estimation, is 60 to 65%. The  left ventricle has normal function. The left ventricle has no regional  wall motion abnormalities. There is moderate concentric left ventricular  hypertrophy. Left ventricular  diastolic parameters are consistent with Grade II diastolic dysfunction  (pseudonormalization). Elevated left atrial pressure.   2. Right ventricular systolic function is normal. The right ventricular  size is mildly enlarged. There is mildly elevated pulmonary artery  systolic pressure. The estimated right ventricular systolic pressure is  40.9 mmHg.   3. Left atrial size was moderately dilated.   4. The mitral valve is grossly normal. Mild mitral valve regurgitation.  No evidence of mitral stenosis. Moderate mitral annular calcification.   5. The aortic valve is severely calcified with restricted excursion of  valve leaflets. On limited assessment, mean transvalvular gradient  ~71mmHg. There is no aortic regurgitation.   6. Aortic dilatation noted. There is borderline dilatation of the  ascending aorta, measuring 37 mm.   7. The inferior vena cava is normal in size with greater than 50%  respiratory variability, suggesting right atrial pressure of 3 mmHg.     Physical Exam:   Physical Exam Vitals  and nursing note reviewed.  Constitutional:      General: She is not in acute distress. Neck:     Vascular: No JVD.  Cardiovascular:     Rate and Rhythm: Normal rate and regular rhythm.     Heart sounds: Normal heart sounds. No murmur heard. Pulmonary:     Effort: Pulmonary effort is normal.     Breath sounds: Normal breath sounds. No wheezing or rales.  Musculoskeletal:     Right lower leg: No edema.     Left lower leg: No edema.      VISIT DIAGNOSES:   ICD-10-CM   1. Coronary  artery disease involving native coronary artery of native heart without angina pectoris  I25.10 EKG 12-Lead    AMB referral to cardiac rehabilitation    CANCELED: AMB referral to cardiac rehabilitation    2. Nonrheumatic aortic valve stenosis  I35.0 ECHOCARDIOGRAM COMPLETE        ASSESSMENT AND PLAN: .    AALEYAH WITHEROW is a 89 y.o. female with hypertension, hyperlipidemia, CAD, aortic stenosis   CAD: S/p PCI to ostial ramus for unstable angina with resolution of chest pain symptoms (04/2023). Patent proximal ramus, proximal/mid RCA stents with rest mild disease. Recommend aspirin and Plavix at least in 03/2024 given overlapping stents.  Following that, would consider stopping aspirin and continuing Plavix monotherapy. Continue rosuvastatin 20 mg daily, lipids very well-controlled. Stop Imdur. Resting bradycardia, therefore not on beta-blocker.   Minimal swelling in right radial artery area, possibly a small pseudoaneurysm.  Intact pulses without any symptoms, no further treatment facility.   Hypertension: Occasional spikes, but generally lower blood pressure.  If blood pressure does not improve after stopping Imdur and increasing fluid intake, will consider stopping lisinopril 5 mg daily. Reduce lasix to only prn use.  Aortic stenosis: Severe aortic calcification with mild aortic stenosis on echocardiogram 02/2023. Repeat echocardiogram in 03/2024.  Meds ordered this encounter  Medications   furosemide (LASIX) 20 MG tablet    Sig: Take 1 tablet (20 mg total) by mouth as needed for fluid.    Dispense:  90 tablet    Refill:  3   potassium chloride (KLOR-CON M) 10 MEQ tablet    Sig: Take 1 tablet (10 mEq total) by mouth daily as needed (with use of lasix).    Dispense:  30 tablet    Refill:  3     F/u in 10/2023  Signed, Elder Negus, MD

## 2023-05-04 NOTE — Patient Instructions (Signed)
 Medication Instructions:  CHANGED Lasix to as needed  CHANGED Potassium to take with use of lasix  *If you need a refill on your cardiac medications before your next appointment, please call your pharmacy*   Testing/Procedures: Echo in 03/2024  REFERRAL TO CARDIAC REHAB   Follow-Up: At [facility], you and your health needs are our priority.  As part of our continuing mission to provide you with exceptional heart care, our providers are all part of one team.  This team includes your primary Cardiologist (physician) and Advanced Practice Providers or APPs (Physician Assistants and Nurse Practitioners) who all work together to provide you with the care you need, when you need it.  Your next appointment:   10/2023   Provider:   Elder Negus, MD     We recommend signing up for the patient portal called "MyChart".  Sign up information is provided on this After Visit Summary.  MyChart is used to connect with patients for Virtual Visits (Telemedicine).  Patients are able to view lab/test results, encounter notes, upcoming appointments, etc.  Non-urgent messages can be sent to your provider as well.   To learn more about what you can do with MyChart, go to [email or web address].   Other Instructions       1st Floor: - Lobby - Registration  - Pharmacy  - Lab - Cafe  2nd Floor: - PV Lab - Diagnostic Testing (echo, CT, nuclear med)  3rd Floor: - Vacant  4th Floor: - TCTS (cardiothoracic surgery) - AFib Clinic - Structural Heart Clinic - Vascular Surgery  - Vascular Ultrasound  5th Floor: - HeartCare Cardiology (general and EP) - Clinical Pharmacy for coumadin, hypertension, lipid, weight-loss medications, and med management appointments    Valet parking services will be available as well.

## 2023-05-09 ENCOUNTER — Ambulatory Visit (HOSPITAL_COMMUNITY)
Admission: RE | Admit: 2023-05-09 | Discharge: 2023-05-09 | Disposition: A | Discharge status: Home / Self Care | Source: Ambulatory Visit | Attending: Cardiology | Admitting: Cardiology

## 2023-05-09 DIAGNOSIS — I2729 Other secondary pulmonary hypertension: Secondary | ICD-10-CM

## 2023-05-09 DIAGNOSIS — I10 Essential (primary) hypertension: Secondary | ICD-10-CM | POA: Insufficient documentation

## 2023-05-10 ENCOUNTER — Encounter: Attending: Cardiology

## 2023-05-10 ENCOUNTER — Other Ambulatory Visit: Payer: Self-pay

## 2023-05-10 DIAGNOSIS — Z48812 Encounter for surgical aftercare following surgery on the circulatory system: Secondary | ICD-10-CM | POA: Insufficient documentation

## 2023-05-10 DIAGNOSIS — Z955 Presence of coronary angioplasty implant and graft: Secondary | ICD-10-CM | POA: Insufficient documentation

## 2023-05-10 NOTE — Progress Notes (Signed)
 Virtual Visit completed. Patient informed on EP and RD appointment and 6 Minute walk test. Patient also informed of patient health questionnaires on My Chart. Patient Verbalizes understanding. Visit diagnosis can be found in [facility] 04/21/2023.

## 2023-05-12 ENCOUNTER — Encounter: Payer: Self-pay | Admitting: Cardiology

## 2023-05-15 NOTE — Telephone Encounter (Signed)
 Technically, yes. One kidney is small, but that is not an acute finding. This has likely occurred over a period of time and not reversible. In spite of that, the remaining functioning kidney is doing well. This is reassuring.  Thanks MJP

## 2023-05-22 ENCOUNTER — Encounter

## 2023-05-22 ENCOUNTER — Other Ambulatory Visit: Payer: Self-pay

## 2023-05-22 VITALS — Ht 65.0 in | Wt 142.2 lb

## 2023-05-22 DIAGNOSIS — Z955 Presence of coronary angioplasty implant and graft: Secondary | ICD-10-CM | POA: Diagnosis not present

## 2023-05-22 DIAGNOSIS — Z48812 Encounter for surgical aftercare following surgery on the circulatory system: Secondary | ICD-10-CM | POA: Diagnosis not present

## 2023-05-22 NOTE — Progress Notes (Signed)
 Cardiac Individual Treatment Plan  Patient Details  Name: Amy Lin MRN: [id number] Date of Birth: 12-02-1933 Referring Provider:   Flowsheet Row Cardiac Rehab from 05/22/2023 in [facility] Cardiac and Pulmonary Rehab  Referring Provider Dr. Fransico Ivy, MD       Initial Encounter Date:  Flowsheet Row Cardiac Rehab from 05/22/2023 in [facility] Cardiac and Pulmonary Rehab  Date 05/22/23       Visit Diagnosis: Status post coronary artery stent placement  Patient's Home Medications on Admission:  Current Outpatient Medications:    aspirin  81 MG EC tablet, Take 81 mg by mouth daily before breakfast., Disp: , Rfl:    Cholecalciferol (VITAMIN D3) 2000 UNITS TABS, Take 2,000 Units by mouth every evening., Disp: , Rfl:    clopidogrel  (PLAVIX ) 75 MG tablet, Take 1 tablet (75 mg total) by mouth daily with breakfast., Disp: 90 tablet, Rfl: 3   denosumab  (PROLIA ) 60 MG/ML SOSY injection, Inject 60 mg into the skin every 6 (six) months. (Patient not taking: Reported on 05/10/2023), Disp: , Rfl:    ezetimibe  (ZETIA ) 10 MG tablet, Take 1 tablet (10 mg total) by mouth daily. (Patient taking differently: Take 10 mg by mouth at bedtime.), Disp: 90 tablet, Rfl: 2   fluticasone  (FLONASE ) 50 MCG/ACT nasal spray, PLACE ONE SPRAY INTO BOTH NOSTRILS DAILY (Patient taking differently: Place 1 spray into both nostrils daily as needed for allergies.), Disp: 16 mL, Rfl: 2   furosemide  (LASIX ) 20 MG tablet, Take 1 tablet (20 mg total) by mouth as needed for fluid., Disp: 90 tablet, Rfl: 3   hydrocortisone  2.5 % lotion, Apply 1 Application topically 2 (two) times daily as needed (bug bites/skin irritation.)., Disp: , Rfl:    levocetirizine (XYZAL) 5 MG tablet, Take 5 mg by mouth daily as needed for allergies., Disp: , Rfl:    levothyroxine  (SYNTHROID ) 75 MCG tablet, TAKE ONE TABLET BY MOUTH ONCE A DAY. TAKE ON AN EMPTY STOMACH WITH A GLASS OF WATER ATLEAST 30-60 MIN BEFORE BREAKFAST, Disp: 90 tablet, Rfl: 1    lisinopril  (ZESTRIL ) 5 MG tablet, TAKE ONE TABLET BY MOUTH ONCE A DAY (Patient taking differently: Take 5 mg by mouth at bedtime.), Disp: 90 tablet, Rfl: PRN   Multiple Vitamins-Minerals (PRESERVISION AREDS 2 PO), Take 1 capsule by mouth in the morning and at bedtime., Disp: , Rfl:    Naphazoline-Pheniramine (OPCON-A) 0.027-0.315 % SOLN, Place 1-2 drops into both eyes 3 (three) times daily as needed (irritated/red eyes)., Disp: , Rfl:    nitroGLYCERIN  (NITROSTAT ) 0.4 MG SL tablet, Place 1 tablet (0.4 mg total) under the tongue every 5 (five) minutes as needed for chest pain., Disp: 30 tablet, Rfl: 3   ondansetron  (ZOFRAN -ODT) 4 MG disintegrating tablet, Take 1 tablet (4 mg total) by mouth every 8 (eight) hours as needed for nausea or vomiting. (Patient not taking: Reported on 05/10/2023), Disp: 10 tablet, Rfl: 0   pantoprazole  (PROTONIX ) 20 MG tablet, TAKE ONE TABLET BY MOUTH TWICE A DAY, Disp: 180 tablet, Rfl: 2   potassium chloride  (KLOR-CON  M) 10 MEQ tablet, Take 1 tablet (10 mEq total) by mouth daily as needed (with use of lasix ). (Patient not taking: Reported on 05/10/2023), Disp: 30 tablet, Rfl: 3   potassium chloride  (KLOR-CON ) 10 MEQ tablet, Take 10 mEq by mouth daily., Disp: , Rfl:    rosuvastatin  (CRESTOR ) 20 MG tablet, TAKE ONE TABLET BY MOUTH ONCE A DAY (Patient taking differently: Take 20 mg by mouth every evening.), Disp: 90 tablet, Rfl: PRN  Past  Medical History: Past Medical History:  Diagnosis Date   Allergy    Coronary artery disease    GERD (gastroesophageal reflux disease)    Hyperlipidemia    Hypertension    diuretic for edema at first   Hypothyroidism    Myocardial infarction [facility])    Osteoarthritis, multiple sites    Osteoporosis    Squamous cell carcinoma of skin 12/08/2022   Right pretibial. WD SCC. Mohs 01/31/23    Tobacco Use: Social History   Tobacco Use  Smoking Status Never  Smokeless Tobacco Never    Labs: Review Flowsheet  More data exists       Latest Ref Rng & Units 11/26/2021 05/31/2022 07/14/2022 07/15/2022 04/17/2023  Labs for ITP Cardiac and Pulmonary Rehab  Cholestrol 100 - 199 mg/dL 213  086  - - 578   LDL (calc) 0 - 99 mg/dL 88  469  - 82     59   HDL-C >39 mg/dL 62.95  28.41  - 54     52   Trlycerides 0 - 149 mg/dL 324.4  010.2  - 725     113   Hemoglobin A1c - - - 6.6  C    - -    Details      C Corrected result   This result is from an external source.          Exercise Target Goals: Exercise Program Goal: Individual exercise prescription set using results from initial 6 min walk test and THRR while considering  patient's activity barriers and safety.   Exercise Prescription Goal: Initial exercise prescription builds to 30-45 minutes a day of aerobic activity, 2-3 days per week.  Home exercise guidelines will be given to patient during program as part of exercise prescription that the participant will acknowledge.   Education: Aerobic Exercise: - Group verbal and visual presentation on the components of exercise prescription. Introduces F.I.T.T principle from ACSM for exercise prescriptions.  Reviews F.I.T.T. principles of aerobic exercise including progression. Written material given at graduation. Flowsheet Row Cardiac Rehab from 06/11/2020 in [facility] Cardiac and Pulmonary Rehab  Date 05/21/20  Educator KL  Instruction Review Code 1- Verbalizes Understanding       Education: Resistance Exercise: - Group verbal and visual presentation on the components of exercise prescription. Introduces F.I.T.T principle from ACSM for exercise prescriptions  Reviews F.I.T.T. principles of resistance exercise including progression. Written material given at graduation. Flowsheet Row Cardiac Rehab from 06/11/2020 in [facility] Cardiac and Pulmonary Rehab  Date 05/28/20  Educator [facility]  Instruction Review Code 1- Verbalizes Understanding        Education: Exercise & Equipment Safety: - Individual verbal instruction and demonstration  of equipment use and safety with use of the equipment. Flowsheet Row Cardiac Rehab from 05/22/2023 in [facility] Cardiac and Pulmonary Rehab  Date 05/10/23  Educator jh  Instruction Review Code 1- Verbalizes Understanding       Education: Exercise Physiology & General Exercise Guidelines: - Group verbal and written instruction with models to review the exercise physiology of the cardiovascular system and associated critical values. Provides general exercise guidelines with specific guidelines to those with heart or lung disease.  Flowsheet Row Cardiac Rehab from 06/11/2020 in [facility] Cardiac and Pulmonary Rehab  Date 05/14/20  Educator [facility]  Instruction Review Code 1- Verbalizes Understanding       Education: Flexibility, Balance, Mind/Body Relaxation: - Group verbal and visual presentation with interactive activity on the components of exercise prescription. Introduces F.I.T.T principle from  ACSM for exercise prescriptions. Reviews F.I.T.T. principles of flexibility and balance exercise training including progression. Also discusses the mind body connection.  Reviews various relaxation techniques to help reduce and manage stress (i.e. Deep breathing, progressive muscle relaxation, and visualization). Balance handout provided to take home. Written material given at graduation. Flowsheet Row Cardiac Rehab from 06/11/2020 in [facility] Cardiac and Pulmonary Rehab  Date 06/04/20  Educator AS  Instruction Review Code 1- Verbalizes Understanding       Activity Barriers & Risk Stratification:  Activity Barriers & Cardiac Risk Stratification - 05/22/23 1527       Activity Barriers & Cardiac Risk Stratification   Activity Barriers Arthritis;Joint Problems;Muscular Weakness    Cardiac Risk Stratification Moderate             6 Minute Walk:  6 Minute Walk     Row Name 05/22/23 1530         6 Minute Walk   Phase Initial     Distance 1210 feet     Walk Time 6 minutes     # of Rest Breaks 0      MPH 2.29     METS 2.15     RPE 12     Perceived Dyspnea  1     VO2 Peak 7.53     Symptoms No     Resting HR 60 bpm     Resting BP 116/58     Resting Oxygen Saturation  97 %     Exercise Oxygen Saturation  during 6 min walk 97 %     Max Ex. HR 101 bpm     Max Ex. BP 152/68     2 Minute Post BP 148/66              Oxygen Initial Assessment:   Oxygen Re-Evaluation:   Oxygen Discharge (Final Oxygen Re-Evaluation):   Initial Exercise Prescription:  Initial Exercise Prescription - 05/22/23 1500       Date of Initial Exercise RX and Referring Provider   Date 05/22/23    Referring Provider Dr. Fransico Ivy, MD      Oxygen   Maintain Oxygen Saturation 88% or higher      Treadmill   MPH 1.8    Grade 0    Minutes 15    METs 2.38      Recumbant Bike   Level 1    RPM 50    Watts 15    Minutes 15    METs 2.15      NuStep   Level 2    SPM 80    Minutes 15    METs 2.15      Biostep-RELP   Level 1    SPM 50    Minutes 15    METs 2.15      Prescription Details   Frequency (times per week) 2    Duration Progress to 30 minutes of continuous aerobic without signs/symptoms of physical distress      Intensity   THRR 40-80% of Max Heartrate 89-118    Ratings of Perceived Exertion 11-13    Perceived Dyspnea 0-4      Progression   Progression Continue to progress workloads to maintain intensity without signs/symptoms of physical distress.      Resistance Training   Training Prescription Yes    Weight 2 lb    Reps 10-15             Perform Capillary Blood Glucose checks as  needed.  Exercise Prescription Changes:   Exercise Prescription Changes     Row Name 05/22/23 1500             Response to Exercise   Blood Pressure (Admit) 116/58       Blood Pressure (Exercise) 152/68       Blood Pressure (Exit) 148/66       Heart Rate (Admit) 60 bpm       Heart Rate (Exercise) 101 bpm       Heart Rate (Exit) 67 bpm       Oxygen Saturation  (Admit) 97 %       Oxygen Saturation (Exercise) 97 %       Rating of Perceived Exertion (Exercise) 12       Perceived Dyspnea (Exercise) 1       Symptoms none       Comments Results                Exercise Comments:   Exercise Goals and Review:   Exercise Goals     Row Name 05/22/23 1526             Exercise Goals   Increase Physical Activity Yes       Intervention Provide advice, education, support and counseling about physical activity/exercise needs.;Develop an individualized exercise prescription for aerobic and resistive training based on initial evaluation findings, risk stratification, comorbidities and participant's personal goals.       Expected Outcomes Short Term: Attend rehab on a regular basis to increase amount of physical activity.;Long Term: Add in home exercise to make exercise part of routine and to increase amount of physical activity.;Long Term: Exercising regularly at least 3-5 days a week.       Increase Strength and Stamina Yes       Intervention Provide advice, education, support and counseling about physical activity/exercise needs.;Develop an individualized exercise prescription for aerobic and resistive training based on initial evaluation findings, risk stratification, comorbidities and participant's personal goals.       Expected Outcomes Short Term: Increase workloads from initial exercise prescription for resistance, speed, and METs.;Short Term: Perform resistance training exercises routinely during rehab and add in resistance training at home;Long Term: Improve cardiorespiratory fitness, muscular endurance and strength as measured by increased METs and functional capacity ( )       Able to understand and use rate of perceived exertion (RPE) scale Yes       Intervention Provide education and explanation on how to use RPE scale       Expected Outcomes Short Term: Able to use RPE daily in rehab to express subjective intensity level;Long Term:   Able to use RPE to guide intensity level when exercising independently       Able to understand and use Dyspnea scale Yes       Intervention Provide education and explanation on how to use Dyspnea scale       Expected Outcomes Short Term: Able to use Dyspnea scale daily in rehab to express subjective sense of shortness of breath during exertion;Long Term: Able to use Dyspnea scale to guide intensity level when exercising independently       Knowledge and understanding of Target Heart Rate Range (THRR) Yes       Intervention Provide education and explanation of THRR including how the numbers were predicted and where they are located for reference       Expected Outcomes Short Term: Able to state/look up THRR;Short Term: Able  to use daily as guideline for intensity in rehab;Long Term: Able to use THRR to govern intensity when exercising independently       Able to check pulse independently Yes       Intervention Provide education and demonstration on how to check pulse in carotid and radial arteries.;Review the importance of being able to check your own pulse for safety during independent exercise       Expected Outcomes Short Term: Able to explain why pulse checking is important during independent exercise;Long Term: Able to check pulse independently and accurately       Understanding of Exercise Prescription Yes       Intervention Provide education, explanation, and written materials on patient's individual exercise prescription       Expected Outcomes Short Term: Able to explain program exercise prescription;Long Term: Able to explain home exercise prescription to exercise independently                Exercise Goals Re-Evaluation :   Discharge Exercise Prescription (Final Exercise Prescription Changes):  Exercise Prescription Changes - 05/22/23 1500       Response to Exercise   Blood Pressure (Admit) 116/58    Blood Pressure (Exercise) 152/68    Blood Pressure (Exit) 148/66    Heart  Rate (Admit) 60 bpm    Heart Rate (Exercise) 101 bpm    Heart Rate (Exit) 67 bpm    Oxygen Saturation (Admit) 97 %    Oxygen Saturation (Exercise) 97 %    Rating of Perceived Exertion (Exercise) 12    Perceived Dyspnea (Exercise) 1    Symptoms none    Comments Results             Nutrition:  Target Goals: Understanding of nutrition guidelines, daily intake of sodium 1500mg , cholesterol 200mg , calories 30% from fat and 7% or less from saturated fats, daily to have 5 or more servings of fruits and vegetables.  Education: All About Nutrition: -Group instruction provided by verbal, written material, interactive activities, discussions, models, and posters to present general guidelines for heart healthy nutrition including fat, fiber, MyPlate, the role of sodium in heart healthy nutrition, utilization of the nutrition label, and utilization of this knowledge for meal planning. Follow up email sent as well. Written material given at graduation. Flowsheet Row Cardiac Rehab from 06/11/2020 in [facility] Cardiac and Pulmonary Rehab  Date 06/11/20  Educator [facility]  Instruction Review Code 1- Verbalizes Understanding       Biometrics:  Pre Biometrics - 05/22/23 1527       Pre Biometrics   Height 5\' 5"  (1.651 m)    Weight 142 lb 3.2 oz (64.5 kg)    Waist Circumference 34.5 inches    Hip Circumference 38.5 inches    Waist to Hip Ratio 0.9 %    BMI (Calculated) 23.66    Single Leg Stand 25.2 seconds              Nutrition Therapy Plan and Nutrition Goals:  Nutrition Therapy & Goals - 05/22/23 1428       Nutrition Therapy   Diet Cardiac, Low Na    Protein (specify units) 65    Fiber 25 grams    Whole Grain Foods 3 servings    Saturated Fats 15 max. grams    Fruits and Vegetables 5 servings/day    Sodium 2 grams      Personal Nutrition Goals   Nutrition Goal Eat 15-30gProtein and 30-60gCarbs at each meal.  Personal Goal #2 Drink 48oz of water daily    Personal Goal #3  Include more veggies and/or fruit    Comments Patient drinking ~32oz of water daily. She feels she could do better, set goal to aim for ~48oz. She eats 3 times per day, he meals are often balanced with protein and carbs portioned well. She likes veggies and eats them daily, though she would like to include more in her diet. Brainstormed some meals ideas to help her include more colorful produce. She does like sweets and eats them after her dinner often. Says its a habit, and will work on improving. She doesn't salt her foods often, but does likes some salty snacks. Encouraged her to look for lower sodium options and try to do less salt. Overall, she is doing very well, eating enough to support her body's needs. Will continue to monitor and support.      Intervention Plan   Intervention Prescribe, educate and counsel regarding individualized specific dietary modifications aiming towards targeted core components such as weight, hypertension, lipid management, diabetes, heart failure and other comorbidities.;Nutrition handout(s) given to patient.    Expected Outcomes Short Term Goal: Understand basic principles of dietary content, such as calories, fat, sodium, cholesterol and nutrients.;Short Term Goal: A plan has been developed with personal nutrition goals set during dietitian appointment.;Long Term Goal: Adherence to prescribed nutrition plan.             Nutrition Assessments:  MEDIFICTS Score Key: >=70 Need to make dietary changes  40-70 Heart Healthy Diet <= 40 Therapeutic Level Cholesterol Diet  Flowsheet Row Cardiac Rehab from 05/22/2023 in [facility] Cardiac and Pulmonary Rehab  Picture Your Plate Total Score on Admission 57      Picture Your Plate Scores: <83 Unhealthy dietary pattern with much room for improvement. 41-50 Dietary pattern unlikely to meet recommendations for good health and room for improvement. 51-60 More healthful dietary pattern, with some room for improvement.  >60  Healthy dietary pattern, although there may be some specific behaviors that could be improved.    Nutrition Goals Re-Evaluation:   Nutrition Goals Discharge (Final Nutrition Goals Re-Evaluation):   Psychosocial: Target Goals: Acknowledge presence or absence of significant depression and/or stress, maximize coping skills, provide positive support system. Participant is able to verbalize types and ability to use techniques and skills needed for reducing stress and depression.   Education: Stress, Anxiety, and Depression - Group verbal and visual presentation to define topics covered.  Reviews how body is impacted by stress, anxiety, and depression.  Also discusses healthy ways to reduce stress and to treat/manage anxiety and depression.  Written material given at graduation. Flowsheet Row Cardiac Rehab from 05/22/2023 in [facility] Cardiac and Pulmonary Rehab  Education need identified 05/22/23       Education: Sleep Hygiene -Provides group verbal and written instruction about how sleep can affect your health.  Define sleep hygiene, discuss sleep cycles and impact of sleep habits. Review good sleep hygiene tips.    Initial Review & Psychosocial Screening:  Initial Psych Review & Screening - 05/10/23 1344       Initial Review   Current issues with None Identified      Family Dynamics   Good Support System? Yes    Comments Dannielle can look to her son, daughter, friends and church for support. She takes no medications for her mood and has done Cardiac rehab before and knows what to expect.      Barriers   Psychosocial barriers  to participate in program There are no identifiable barriers or psychosocial needs.;The patient should benefit from training in stress management and relaxation.      Screening Interventions   Interventions Encouraged to exercise;Provide feedback about the scores to participant;To provide support and resources with identified psychosocial needs    Expected Outcomes  Long Term Goal: Stressors or current issues are controlled or eliminated.;Short Term goal: Utilizing psychosocial counselor, staff and physician to assist with identification of specific Stressors or current issues interfering with healing process. Setting desired goal for each stressor or current issue identified.;Short Term goal: Identification and review with participant of any Quality of Life or Depression concerns found by scoring the questionnaire.;Long Term goal: The participant improves quality of Life and PHQ9 Scores as seen by post scores and/or verbalization of changes             Quality of Life Scores:   Quality of Life - 05/22/23 1524       Quality of Life   Select Quality of Life      Quality of Life Scores   Health/Function Pre 22.1 %    Socioeconomic Pre 25 %    Psych/Spiritual Pre 26.21 %    Family Pre 25.8 %    GLOBAL Pre 24.11 %            Scores of 19 and below usually indicate a poorer quality of life in these areas.  A difference of  2-3 points is a clinically meaningful difference.  A difference of 2-3 points in the total score of the Quality of Life Index has been associated with significant improvement in overall quality of life, self-image, physical symptoms, and general health in studies assessing change in quality of life.  PHQ-9: Review Flowsheet  More data exists      05/22/2023 03/20/2023 12/26/2022 05/31/2022 04/13/2022  Depression screen PHQ 2/9  Decreased Interest 0 0 0 0 0  Down, Depressed, Hopeless 0 0 0 0 0  PHQ - 2 Score 0 0 0 0 0  Altered sleeping 1 - - 0 0  Tired, decreased energy 2 - - 2 1  Change in appetite 0 - - 1 0  Feeling bad or failure about yourself  0 - - 1 0  Trouble concentrating 0 - - 0 0  Moving slowly or fidgety/restless 0 - - 0 0  Suicidal thoughts 0 - - 0 0  PHQ-9 Score 3 - - 4 1  Difficult doing work/chores Somewhat difficult - - Somewhat difficult Somewhat difficult   Interpretation of Total Score  Total Score  Depression Severity:  1-4 = Minimal depression, 5-9 = Mild depression, 10-14 = Moderate depression, 15-19 = Moderately severe depression, 20-27 = Severe depression   Psychosocial Evaluation and Intervention:  Psychosocial Evaluation - 05/10/23 1346       Psychosocial Evaluation & Interventions   Interventions Encouraged to exercise with the program and follow exercise prescription;Relaxation education;Stress management education    Comments Delrae can look to her son, daughter, friends and church for support. She takes no medications for her mood and has done Cardiac rehab before and knows what to expect.    Expected Outcomes Short: Start [facility] to help with mood. Long: Maintain a healthy mental state    Continue Psychosocial Services  Follow up required by staff             Psychosocial Re-Evaluation:   Psychosocial Discharge (Final Psychosocial Re-Evaluation):   Vocational Rehabilitation: Provide vocational rehab assistance to  qualifying candidates.   Vocational Rehab Evaluation & Intervention:   Education: Education Goals: Education classes will be provided on a variety of topics geared toward better understanding of heart health and risk factor modification. Participant will state understanding/return demonstration of topics presented as noted by education test scores.  Learning Barriers/Preferences:  Learning Barriers/Preferences - 05/10/23 1343       Learning Barriers/Preferences   Learning Barriers None    Learning Preferences None             General Cardiac Education Topics:  AED/CPR: - Group verbal and written instruction with the use of models to demonstrate the basic use of the AED with the basic ABC's of resuscitation.   Anatomy and Cardiac Procedures: - Group verbal and visual presentation and models provide information about basic cardiac anatomy and function. Reviews the testing methods done to diagnose heart disease and the outcomes of the  test results. Describes the treatment choices: Medical Management, Angioplasty, or Coronary Bypass Surgery for treating various heart conditions including Myocardial Infarction, Angina, Valve Disease, and Cardiac Arrhythmias.  Written material given at graduation. Flowsheet Row Cardiac Rehab from 06/11/2020 in [facility] Cardiac and Pulmonary Rehab  Date 05/28/20  Educator [facility]  Instruction Review Code 1- Verbalizes Understanding       Medication Safety: - Group verbal and visual instruction to review commonly prescribed medications for heart and lung disease. Reviews the medication, class of the drug, and side effects. Includes the steps to properly store meds and maintain the prescription regimen.  Written material given at graduation. Flowsheet Row Cardiac Rehab from 06/11/2020 in [facility] Cardiac and Pulmonary Rehab  Date 04/16/20  Educator SB  Instruction Review Code 1- Verbalizes Understanding       Intimacy: - Group verbal instruction through game format to discuss how heart and lung disease can affect sexual intimacy. Written material given at graduation.. Flowsheet Row Cardiac Rehab from 06/11/2020 in [facility] Cardiac and Pulmonary Rehab  Date 05/21/20  Educator KL  Instruction Review Code 1- Verbalizes Understanding       Know Your Numbers and Heart Failure: - Group verbal and visual instruction to discuss disease risk factors for cardiac and pulmonary disease and treatment options.  Reviews associated critical values for Overweight/Obesity, Hypertension, Cholesterol, and Diabetes.  Discusses basics of heart failure: signs/symptoms and treatments.  Introduces Heart Failure Zone chart for action plan for heart failure.  Written material given at graduation. Flowsheet Row Cardiac Rehab from 05/22/2023 in [facility] Cardiac and Pulmonary Rehab  Education need identified 05/22/23       Infection Prevention: - Provides verbal and written material to individual with discussion of infection control  including proper hand washing and proper equipment cleaning during exercise session. Flowsheet Row Cardiac Rehab from 05/22/2023 in [facility] Cardiac and Pulmonary Rehab  Date 05/10/23  Educator jh  Instruction Review Code 1- Verbalizes Understanding       Falls Prevention: - Provides verbal and written material to individual with discussion of falls prevention and safety. Flowsheet Row Cardiac Rehab from 05/22/2023 in [facility] Cardiac and Pulmonary Rehab  Date 05/10/23  Educator jh  Instruction Review Code 1- Verbalizes Understanding       Other: -Provides group and verbal instruction on various topics (see comments)   Knowledge Questionnaire Score:  Knowledge Questionnaire Score - 05/22/23 1524       Knowledge Questionnaire Score   Pre Score 24/26             Core Components/Risk Factors/Patient Goals at Admission:  Personal Goals and Risk Factors at Admission - 05/10/23 1343       Core Components/Risk Factors/Patient Goals on Admission    Weight Management Yes;Weight Maintenance;Weight Loss    Intervention Weight Management: Develop a combined nutrition and exercise program designed to reach desired caloric intake, while maintaining appropriate intake of nutrient and fiber, sodium and fats, and appropriate energy expenditure required for the weight goal.;Weight Management: Provide education and appropriate resources to help participant work on and attain dietary goals.;Weight Management/Obesity: Establish reasonable short term and long term weight goals.    Expected Outcomes Short Term: Continue to assess and modify interventions until short term weight is achieved;Long Term: Adherence to nutrition and physical activity/exercise program aimed toward attainment of established weight goal;Weight Loss: Understanding of general recommendations for a balanced deficit meal plan, which promotes 1-2 lb weight loss per week and includes a negative energy balance of [phone]  kcal/d;Understanding recommendations for meals to include 15-35% energy as protein, 25-35% energy from fat, 35-60% energy from carbohydrates, less than 200mg  of dietary cholesterol, 20-35 gm of total fiber daily;Understanding of distribution of calorie intake throughout the day with the consumption of 4-5 meals/snacks;Weight Maintenance: Understanding of the daily nutrition guidelines, which includes 25-35% calories from fat, 7% or less cal from saturated fats, less than 200mg  cholesterol, less than 1.5gm of sodium, & 5 or more servings of fruits and vegetables daily    Hypertension Yes    Intervention Provide education on lifestyle modifcations including regular physical activity/exercise, weight management, moderate sodium restriction and increased consumption of fresh fruit, vegetables, and low fat dairy, alcohol moderation, and smoking cessation.;Monitor prescription use compliance.    Expected Outcomes Short Term: Continued assessment and intervention until BP is < 140/75mm HG in hypertensive participants. < 130/70mm HG in hypertensive participants with diabetes, heart failure or chronic kidney disease.;Long Term: Maintenance of blood pressure at goal levels.    Lipids Yes    Intervention Provide education and support for participant on nutrition & aerobic/resistive exercise along with prescribed medications to achieve LDL 70mg , HDL >40mg .    Expected Outcomes Short Term: Participant states understanding of desired cholesterol values and is compliant with medications prescribed. Participant is following exercise prescription and nutrition guidelines.;Long Term: Cholesterol controlled with medications as prescribed, with individualized exercise RX and with personalized nutrition plan. Value goals: LDL < 70mg , HDL > 40 mg.             Education:Diabetes - Individual verbal and written instruction to review signs/symptoms of diabetes, desired ranges of glucose level fasting, after meals and with  exercise. Acknowledge that pre and post exercise glucose checks will be done for 3 sessions at entry of program.   Core Components/Risk Factors/Patient Goals Review:    Core Components/Risk Factors/Patient Goals at Discharge (Final Review):    ITP Comments:  ITP Comments     Row Name 05/10/23 1342 05/22/23 1520         ITP Comments Virtual Visit completed. Patient informed on EP and RD appointment and 6 Minute walk test. Patient also informed of patient health questionnaires on My Chart. Patient Verbalizes understanding. Visit diagnosis can be found in [facility] 04/21/2023. Completed and gym orientation for cardiac rehab. Initial ITP created and sent for review to Dr. Firman Hughes, Medical Director.               Comments: Initial ITP

## 2023-05-22 NOTE — Patient Instructions (Addendum)
 Patient Instructions  Patient Details  Name: Amy Lin MRN: [id number] Date of Birth: [birth year] Referring Provider:  Cody Das, MD  Below are your personal goals for exercise, nutrition, and risk factors. Our goal is to help you stay on track towards obtaining and maintaining these goals. We will be discussing your progress on these goals with you throughout the program.  Initial Exercise Prescription:  Initial Exercise Prescription - 05/22/23 1500       Date of Initial Exercise RX and Referring Provider   Date 05/22/23    Referring Provider Dr. Fransico Ivy, MD      Oxygen   Maintain Oxygen Saturation 88% or higher      Treadmill   MPH 1.8    Grade 0    Minutes 15    METs 2.38      Recumbant Bike   Level 1    RPM 50    Watts 15    Minutes 15    METs 2.15      NuStep   Level 2    SPM 80    Minutes 15    METs 2.15      Biostep-RELP   Level 1    SPM 50    Minutes 15    METs 2.15      Prescription Details   Frequency (times per week) 2    Duration Progress to 30 minutes of continuous aerobic without signs/symptoms of physical distress      Intensity   THRR 40-80% of Max Heartrate 89-118    Ratings of Perceived Exertion 11-13    Perceived Dyspnea 0-4      Progression   Progression Continue to progress workloads to maintain intensity without signs/symptoms of physical distress.      Resistance Training   Training Prescription Yes    Weight 2 lb    Reps 10-15             Exercise Goals: Frequency: Be able to perform aerobic exercise two to three times per week in program working toward 2-5 days per week of home exercise.  Intensity: Work with a perceived exertion of 11 (fairly light) - 15 (hard) while following your exercise prescription.  We will make changes to your prescription with you as you progress through the program.   Duration: Be able to do 30 to 45 minutes of continuous aerobic exercise in addition to a 5 minute  warm-up and a 5 minute cool-down routine.   Nutrition Goals: Your personal nutrition goals will be established when you do your nutrition analysis with the dietician.  The following are general nutrition guidelines to follow: Cholesterol < 200mg /day Sodium < 1500mg /day Fiber: Women over 50 yrs - 21 grams per day  Personal Goals:  Personal Goals and Risk Factors at Admission - 05/10/23 1343       Core Components/Risk Factors/Patient Goals on Admission    Weight Management Yes;Weight Maintenance;Weight Loss    Intervention Weight Management: Develop a combined nutrition and exercise program designed to reach desired caloric intake, while maintaining appropriate intake of nutrient and fiber, sodium and fats, and appropriate energy expenditure required for the weight goal.;Weight Management: Provide education and appropriate resources to help participant work on and attain dietary goals.;Weight Management/Obesity: Establish reasonable short term and long term weight goals.    Expected Outcomes Short Term: Continue to assess and modify interventions until short term weight is achieved;Long Term: Adherence to nutrition and physical activity/exercise program aimed toward attainment of  established weight goal;Weight Loss: Understanding of general recommendations for a balanced deficit meal plan, which promotes 1-2 lb weight loss per week and includes a negative energy balance of [phone] kcal/d;Understanding recommendations for meals to include 15-35% energy as protein, 25-35% energy from fat, 35-60% energy from carbohydrates, less than 200mg  of dietary cholesterol, 20-35 gm of total fiber daily;Understanding of distribution of calorie intake throughout the day with the consumption of 4-5 meals/snacks;Weight Maintenance: Understanding of the daily nutrition guidelines, which includes 25-35% calories from fat, 7% or less cal from saturated fats, less than 200mg  cholesterol, less than 1.5gm of sodium, & 5 or  more servings of fruits and vegetables daily    Hypertension Yes    Intervention Provide education on lifestyle modifcations including regular physical activity/exercise, weight management, moderate sodium restriction and increased consumption of fresh fruit, vegetables, and low fat dairy, alcohol moderation, and smoking cessation.;Monitor prescription use compliance.    Expected Outcomes Short Term: Continued assessment and intervention until BP is < 140/28mm HG in hypertensive participants. < 130/24mm HG in hypertensive participants with diabetes, heart failure or chronic kidney disease.;Long Term: Maintenance of blood pressure at goal levels.    Lipids Yes    Intervention Provide education and support for participant on nutrition & aerobic/resistive exercise along with prescribed medications to achieve LDL 70mg , HDL >40mg .    Expected Outcomes Short Term: Participant states understanding of desired cholesterol values and is compliant with medications prescribed. Participant is following exercise prescription and nutrition guidelines.;Long Term: Cholesterol controlled with medications as prescribed, with individualized exercise RX and with personalized nutrition plan. Value goals: LDL < 70mg , HDL > 40 mg.            Exercise Goals and Review:  Exercise Goals     Row Name 05/22/23 1526             Exercise Goals   Increase Physical Activity Yes       Intervention Provide advice, education, support and counseling about physical activity/exercise needs.;Develop an individualized exercise prescription for aerobic and resistive training based on initial evaluation findings, risk stratification, comorbidities and participant's personal goals.       Expected Outcomes Short Term: Attend rehab on a regular basis to increase amount of physical activity.;Long Term: Add in home exercise to make exercise part of routine and to increase amount of physical activity.;Long Term: Exercising regularly at  least 3-5 days a week.       Increase Strength and Stamina Yes       Intervention Provide advice, education, support and counseling about physical activity/exercise needs.;Develop an individualized exercise prescription for aerobic and resistive training based on initial evaluation findings, risk stratification, comorbidities and participant's personal goals.       Expected Outcomes Short Term: Increase workloads from initial exercise prescription for resistance, speed, and METs.;Short Term: Perform resistance training exercises routinely during rehab and add in resistance training at home;Long Term: Improve cardiorespiratory fitness, muscular endurance and strength as measured by increased METs and functional capacity ( )       Able to understand and use rate of perceived exertion (RPE) scale Yes       Intervention Provide education and explanation on how to use RPE scale       Expected Outcomes Short Term: Able to use RPE daily in rehab to express subjective intensity level;Long Term:  Able to use RPE to guide intensity level when exercising independently       Able to understand and use Dyspnea scale  Yes       Intervention Provide education and explanation on how to use Dyspnea scale       Expected Outcomes Short Term: Able to use Dyspnea scale daily in rehab to express subjective sense of shortness of breath during exertion;Long Term: Able to use Dyspnea scale to guide intensity level when exercising independently       Knowledge and understanding of Target Heart Rate Range (THRR) Yes       Intervention Provide education and explanation of THRR including how the numbers were predicted and where they are located for reference       Expected Outcomes Short Term: Able to state/look up THRR;Short Term: Able to use daily as guideline for intensity in rehab;Long Term: Able to use THRR to govern intensity when exercising independently       Able to check pulse independently Yes       Intervention  Provide education and demonstration on how to check pulse in carotid and radial arteries.;Review the importance of being able to check your own pulse for safety during independent exercise       Expected Outcomes Short Term: Able to explain why pulse checking is important during independent exercise;Long Term: Able to check pulse independently and accurately       Understanding of Exercise Prescription Yes       Intervention Provide education, explanation, and written materials on patient's individual exercise prescription       Expected Outcomes Short Term: Able to explain program exercise prescription;Long Term: Able to explain home exercise prescription to exercise independently

## 2023-05-22 NOTE — Progress Notes (Signed)
 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 She feels she could do better, set goal to aim for ~48oz. She eats 3 times per day, he meals are often balanced with protein and carbs portioned well. She likes veggies and eats them daily, though she would like to include more in her diet. Brainstormed some meals ideas to help her include more colorful produce. She does like sweets and eats them after her dinner often. Says its a habit, and will work on improving. She doesn't salt her foods often, but does likes some salty snacks. Encouraged her to look for lower sodium options and try to do less salt. Overall, she is doing very well, eating enough to support her body's needs. Will continue to monitor and support.    Goal 1: Eat 15-30gProtein and 30-60gCarbs at each meal. Goal 2: Drink 48oz of water daily Goal 3: Include more veggies and/or fruit   End time 11:51 AM

## 2023-05-23 ENCOUNTER — Other Ambulatory Visit: Payer: Self-pay

## 2023-05-24 ENCOUNTER — Other Ambulatory Visit: Payer: Self-pay

## 2023-05-24 NOTE — Progress Notes (Signed)
 Office still has Prolia  from last November. They are having a hard time getting in touch with patient to schedule lab and nurse visit. Sent pt MyChart message to advise her to contact office as soon as possible. Adjusting next call date.

## 2023-05-25 ENCOUNTER — Ambulatory Visit
Admission: RE | Admit: 2023-05-25 | Discharge: 2023-05-25 | Disposition: A | Discharge status: Home / Self Care | Payer: PPO | Source: Ambulatory Visit | Attending: Nurse Practitioner | Admitting: Nurse Practitioner

## 2023-05-25 ENCOUNTER — Other Ambulatory Visit: Payer: Self-pay

## 2023-05-25 DIAGNOSIS — R921 Mammographic calcification found on diagnostic imaging of breast: Secondary | ICD-10-CM

## 2023-05-26 ENCOUNTER — Other Ambulatory Visit: Payer: Self-pay | Admitting: Nurse Practitioner

## 2023-05-26 DIAGNOSIS — R921 Mammographic calcification found on diagnostic imaging of breast: Secondary | ICD-10-CM

## 2023-05-30 ENCOUNTER — Encounter: Admitting: *Deleted

## 2023-05-30 DIAGNOSIS — Z955 Presence of coronary angioplasty implant and graft: Secondary | ICD-10-CM

## 2023-05-30 DIAGNOSIS — Z48812 Encounter for surgical aftercare following surgery on the circulatory system: Secondary | ICD-10-CM | POA: Diagnosis not present

## 2023-05-30 NOTE — Progress Notes (Signed)
 Daily Session Note  Patient Details  Name: Amy Lin MRN: [id number] Date of Birth: [birth year] Referring Provider:   Flowsheet Row Cardiac Rehab from 05/22/2023 in [facility] Cardiac and Pulmonary Rehab  Referring Provider Dr. Fransico Ivy, MD       Encounter Date: 05/30/2023  Check In:  Session Check In - 05/30/23 0943       Check-In   Supervising physician immediately available to respond to emergencies See telemetry face sheet for immediately available ER MD    Location [facility]-Cardiac & Pulmonary Rehab    Staff Present Lyell Samuel, MS, Exercise Physiologist;Maxon Conetta BS, Exercise Physiologist;Noah Tickle, BS, Exercise Physiologist;Krithika Tome, RN, BSN, CCRP    Virtual Visit No    Medication changes reported     No    Fall or balance concerns reported    No    Warm-up and Cool-down Performed on first and last piece of equipment    Resistance Training Performed Yes    VAD Patient? No    PAD/SET Patient? No      Pain Assessment   Currently in Pain? No/denies                Social History   Tobacco Use  Smoking Status Never  Smokeless Tobacco Never    Goals Met:  Independence with exercise equipment Exercise tolerated well No report of concerns or symptoms today  Goals Unmet:  Not Applicable  Comments: Pt able to follow exercise prescription today without complaint.  Will continue to monitor for progression. First full day of exercise!  Patient was oriented to gym and equipment including functions, settings, policies, and procedures.  Patient's individual exercise prescription and treatment plan were reviewed.  All starting workloads were established based on the results of the 6 minute walk test done at initial orientation visit.  The plan for exercise progression was also introduced and progression will be customized based on patient's performance and goals.    Dr. Firman Hughes is Medical Director for [facility] Cardiac Rehabilitation.  Dr. Fuad  Aleskerov is Medical Director for [facility] Pulmonary Rehabilitation.

## 2023-06-01 ENCOUNTER — Ambulatory Visit: Payer: PPO | Admitting: Nurse Practitioner

## 2023-06-01 ENCOUNTER — Encounter: Attending: Cardiology | Admitting: *Deleted

## 2023-06-01 ENCOUNTER — Encounter: Payer: Self-pay | Admitting: Nurse Practitioner

## 2023-06-01 VITALS — BP 100/60 | HR 51 | Temp 98.0°F | Ht 65.0 in | Wt 141.4 lb

## 2023-06-01 DIAGNOSIS — I2510 Atherosclerotic heart disease of native coronary artery without angina pectoris: Secondary | ICD-10-CM

## 2023-06-01 DIAGNOSIS — I10 Essential (primary) hypertension: Secondary | ICD-10-CM | POA: Diagnosis not present

## 2023-06-01 DIAGNOSIS — Z0000 Encounter for general adult medical examination without abnormal findings: Secondary | ICD-10-CM

## 2023-06-01 DIAGNOSIS — E039 Hypothyroidism, unspecified: Secondary | ICD-10-CM | POA: Diagnosis not present

## 2023-06-01 DIAGNOSIS — Z131 Encounter for screening for diabetes mellitus: Secondary | ICD-10-CM

## 2023-06-01 DIAGNOSIS — Z955 Presence of coronary angioplasty implant and graft: Secondary | ICD-10-CM | POA: Insufficient documentation

## 2023-06-01 DIAGNOSIS — M810 Age-related osteoporosis without current pathological fracture: Secondary | ICD-10-CM | POA: Diagnosis not present

## 2023-06-01 DIAGNOSIS — K219 Gastro-esophageal reflux disease without esophagitis: Secondary | ICD-10-CM

## 2023-06-01 LAB — COMPREHENSIVE METABOLIC PANEL WITH GFR
ALT: 11 U/L (ref 0–35)
AST: 18 U/L (ref 0–37)
Albumin: 3.9 g/dL (ref 3.5–5.2)
Alkaline Phosphatase: 68 U/L (ref 39–117)
BUN: 21 mg/dL (ref 6–23)
CO2: 28 meq/L (ref 19–32)
Calcium: 9.4 mg/dL (ref 8.4–10.5)
Chloride: 105 meq/L (ref 96–112)
Creatinine, Ser: 0.89 mg/dL (ref 0.40–1.20)
GFR: 58.33 mL/min — ABNORMAL LOW (ref >60.00)
Glucose, Bld: 85 mg/dL (ref 70–99)
Potassium: 4.6 meq/L (ref 3.5–5.1)
Sodium: 139 meq/L (ref 135–145)
Total Bilirubin: 0.5 mg/dL (ref 0.2–1.2)
Total Protein: 7 g/dL (ref 6.0–8.3)

## 2023-06-01 LAB — CBC
HCT: 35.9 % — ABNORMAL LOW (ref 36.0–46.0)
Hemoglobin: 12 g/dL (ref 12.0–15.0)
MCHC: 33.4 g/dL (ref 30.0–36.0)
MCV: 94.9 fl (ref 78.0–100.0)
Platelets: 150 10*3/uL (ref 150.0–400.0)
RBC: 3.79 Mil/uL — ABNORMAL LOW (ref 3.87–5.11)
RDW: 13.7 % (ref 11.5–15.5)
WBC: 4.4 10*3/uL (ref 4.0–10.5)

## 2023-06-01 LAB — LIPID PANEL
Cholesterol: 122 mg/dL (ref 0–200)
HDL: 52 mg/dL (ref >39.00)
LDL Cholesterol: 42 mg/dL (ref 0–99)
NonHDL: 69.63
Total CHOL/HDL Ratio: 2
Triglycerides: 137 mg/dL (ref 0.0–149.0)
VLDL: 27.4 mg/dL (ref 0.0–40.0)

## 2023-06-01 LAB — VITAMIN D 25 HYDROXY (VIT D DEFICIENCY, FRACTURES): VITD: 52.09 ng/mL (ref 30.00–100.00)

## 2023-06-01 LAB — HEMOGLOBIN A1C: Hgb A1c MFr Bld: 6.7 % — ABNORMAL HIGH (ref 4.6–6.5)

## 2023-06-01 LAB — TSH: TSH: 5.53 u[IU]/mL — ABNORMAL HIGH (ref 0.35–5.50)

## 2023-06-01 MED ORDER — HYDROCORTISONE 2.5 % EX LOTN: 1.0000 | TOPICAL_LOTION | Freq: Two times a day (BID) | CUTANEOUS | 1 refills | Status: DC | PRN

## 2023-06-01 NOTE — Assessment & Plan Note (Signed)
 History of the same.  Patient will be due for DEXA scan later this year.  Order placed today.  Patient to continue Prolia 

## 2023-06-01 NOTE — Assessment & Plan Note (Signed)
 Patient currently maintained on Protonix  20 mg twice daily.  Stable continue

## 2023-06-01 NOTE — Patient Instructions (Signed)
 Nice to see you today I will be in touch with the labs once I have them Follow up in 6 months, sooner if you need me

## 2023-06-01 NOTE — Assessment & Plan Note (Signed)
 Patient currently maintained on Lasix  20 mg daily as needed and lisinopril  5 mg.  She does have episodes of hypotension.  She can check blood pressure at home.  She does hold lisinopril  if she is hypotensive continue with plan

## 2023-06-01 NOTE — Assessment & Plan Note (Signed)
 Patient currently maintained on levothyroxine  75 mcg daily.  We did go over proper instructions in taking this medication.  Pending TSH if TSH is normal patient will continue taking medication as she is because she will be therapeutic

## 2023-06-01 NOTE — Progress Notes (Signed)
 Established Patient Office Visit  Subjective   Patient ID: Amy Lin, female    DOB: 1933-08-22  Age: 89 y.o. MRN: [id number]  Chief Complaint  Patient presents with   Annual Exam   Medication Management    Pt wants to know if she can take Aspirin , Protonix , Synthroid  be taken at the same time before breakfast?     HPI  HTN: on lasix  20, lisinopril  5. Does check blood pressre at home. States that she does it several times a week or more if she does not feel well. States that she is havingn low blood pressures that are low in the afternoon. Statse that she takes the lisinopril  at night but will hold it at night. She does drink olenty of fluid    GERD: on protonix  20mg  BID.  Tolerates medication well  Hypothyroidism: on levothyroxine  75mcg daily.  Tolerates medication well.  States has been taking her levothyroxine  with her aspirin  and Protonix  for an extended period of time before breakfast.  HLD: on crestor  20mg  and ezetimibe  10 mg.  She is followed by cardiology  Osteoporosis: on prolia .  Last DEXA scan was in 2023  for complete physical and follow up of chronic conditions.  Immunizations: -Tetanus: Completed in unsure.  -Influenza: out of season  -Shingles: at pharmacy  -Pneumonia: update today with prevnar   Diet: Fair diet. 3 meals a day on most day. Some snacking but not a lot. She is eating fruit. She is drinking water and coffee in the am  Exercise: No regular exercise. Has a treadmill until the winter and getting sick. She will do yard work. States that she had cardiac rehab  Eye exam: Completes annually up to date  Dental exam: needs updating    Colonoscopy: Completed in 2016. Hx of internal hemrrhoids and had them banded.  Defer at current juncture Lung Cancer Screening: N/A  Pap smear: Aged out  Dexa: 05/12/2021  Mammogram: 05/05/2023, repeat in 6 months   Sleep: she is going to bed around different times not before 11-130 and get up around  730  Advance directive: does not have one and did not want any additional information about the       Review of Systems  Constitutional:  Negative for chills and fever.  Respiratory:  Negative for shortness of breath.   Cardiovascular:  Negative for chest pain and leg swelling.  Gastrointestinal:  Negative for abdominal pain, blood in stool, constipation, diarrhea, nausea and vomiting.       BM daily   Genitourinary:  Negative for dysuria and hematuria.  Neurological:  Negative for dizziness, tingling and headaches.  Psychiatric/Behavioral:  Negative for hallucinations and suicidal ideas.       Objective:     BP 100/60   Pulse (!) 51   Temp 98 F (36.7 C) (Oral)   Ht 5\' 5"  (1.651 m)   Wt 141 lb 6.4 oz (64.1 kg)   SpO2 98%   BMI 23.53 kg/m  BP Readings from Last 3 Encounters:  06/01/23 100/60  05/04/23 122/70  04/21/23 (!) 141/63   Wt Readings from Last 3 Encounters:  06/01/23 141 lb 6.4 oz (64.1 kg)  05/22/23 142 lb 3.2 oz (64.5 kg)  05/04/23 142 lb 3.2 oz (64.5 kg)   SpO2 Readings from Last 3 Encounters:  06/01/23 98%  05/04/23 97%  04/21/23 97%      Physical Exam Vitals and nursing note reviewed.  Constitutional:      Appearance: Normal appearance.  HENT:     Right Ear: Tympanic membrane, ear canal and external ear normal.     Left Ear: Tympanic membrane, ear canal and external ear normal.     Mouth/Throat:     Mouth: Mucous membranes are moist.     Pharynx: Oropharynx is clear.  Eyes:     Extraocular Movements: Extraocular movements intact.     Pupils: Pupils are equal, round, and reactive to light.  Cardiovascular:     Rate and Rhythm: Normal rate and regular rhythm.     Pulses: Normal pulses.     Heart sounds: Normal heart sounds.  Pulmonary:     Effort: Pulmonary effort is normal.     Breath sounds: Normal breath sounds.  Abdominal:     General: Bowel sounds are normal. There is no distension.     Palpations: There is no mass.      Tenderness: There is no abdominal tenderness.     Hernia: No hernia is present.  Musculoskeletal:     Right lower leg: No edema.     Left lower leg: No edema.  Lymphadenopathy:     Cervical: No cervical adenopathy.  Skin:    General: Skin is warm.  Neurological:     General: No focal deficit present.     Mental Status: She is alert.     Deep Tendon Reflexes:     Reflex Scores:      Bicep reflexes are 2+ on the right side and 2+ on the left side.      Patellar reflexes are 2+ on the right side and 2+ on the left side.    Comments: Bilateral upper and lower extremity strength 5/5  Psychiatric:        Mood and Affect: Mood normal.        Behavior: Behavior normal.        Thought Content: Thought content normal.        Judgment: Judgment normal.      No results found for any visits on 06/01/23.    The ASCVD Risk score (Arnett DK, et al., 2019) failed to calculate for the following reasons:   The 2019 ASCVD risk score is only valid for ages 33 to 49   Risk score cannot be calculated because patient has a medical history suggesting prior/existing ASCVD    Assessment & Plan:   Problem List Items Addressed This Visit       Cardiovascular and Mediastinum   Essential hypertension   Patient currently maintained on Lasix  20 mg daily as needed and lisinopril  5 mg.  She does have episodes of hypotension.  She can check blood pressure at home.  She does hold lisinopril  if she is hypotensive continue with plan      Relevant Orders   CBC   Comprehensive metabolic panel with GFR   CAD (coronary artery disease), native coronary artery   History of same with recent stenting.  Patient is currently in cardiac rehab.  She is followed by cardiology.  Patient is on rosuvastatin  20 mg, clopidogrel , and aspirin .  Continue taking medication as prescribed follow-up with specialist as recommended      Relevant Orders   Lipid panel     Digestive   GERD   Patient currently maintained on  Protonix  20 mg twice daily.  Stable continue        Endocrine   Hypothyroidism   Patient currently maintained on levothyroxine  75 mcg daily.  We did go over proper instructions in  taking this medication.  Pending TSH if TSH is normal patient will continue taking medication as she is because she will be therapeutic      Relevant Orders   TSH     Musculoskeletal and Integument   Osteoporosis   History of the same.  Patient will be due for DEXA scan later this year.  Order placed today.  Patient to continue Prolia       Relevant Orders   VITAMIN D  25 Hydroxy (Vit-D Deficiency, Fractures)   DG Bone Density     Other   Preventative health care - Primary   Discussed age-appropriate immunizations and screening exams.  Did review patient's personal, surgical, social, family histories.  Patient is up-to-date on all age-appropriate vaccinations she would like.  Intended to give Prevnar 20 today but will let patient go.  Patient defers CRC screening currently.  Up-to-date on mammogram.  Order placed for DEXA scan today.  Patient was given information at discharge about preventative healthcare maintenance with anticipatory guidance.      Other Visit Diagnoses       Screening for diabetes mellitus       Relevant Orders   Hemoglobin A1c       Return in about 6 months (around 12/02/2023) for BP recheck.    Margarie Shay, NP

## 2023-06-01 NOTE — Assessment & Plan Note (Signed)
 Discussed age-appropriate immunizations and screening exams.  Did review patient's personal, surgical, social, family histories.  Patient is up-to-date on all age-appropriate vaccinations she would like.  Intended to give Prevnar 20 today but will let patient go.  Patient defers CRC screening currently.  Up-to-date on mammogram.  Order placed for DEXA scan today.  Patient was given information at discharge about preventative healthcare maintenance with anticipatory guidance.

## 2023-06-01 NOTE — Assessment & Plan Note (Signed)
 History of same with recent stenting.  Patient is currently in cardiac rehab.  She is followed by cardiology.  Patient is on rosuvastatin  20 mg, clopidogrel , and aspirin .  Continue taking medication as prescribed follow-up with specialist as recommended

## 2023-06-01 NOTE — Progress Notes (Signed)
 Daily Session Note  Patient Details  Name: Amy Lin MRN: [id number] Date of Birth: 07/04/1933 Referring Provider:   Flowsheet Row Cardiac Rehab from 05/22/2023 in [facility] Cardiac and Pulmonary Rehab  Referring Provider Dr. Fransico Ivy, MD       Encounter Date: 06/01/2023  Check In:  Session Check In - 06/01/23 1348       Check-In   Supervising physician immediately available to respond to emergencies See telemetry face sheet for immediately available ER MD    Location [facility]-Cardiac & Pulmonary Rehab    Staff Present Sue Em RN,BSN;[facility] BS, Exercise Physiologist;Noah Tickle, BS, Exercise Physiologist    Virtual Visit No    Medication changes reported     No    Fall or balance concerns reported    No    Warm-up and Cool-down Performed on first and last piece of equipment    Resistance Training Performed Yes    VAD Patient? No    PAD/SET Patient? No      Pain Assessment   Currently in Pain? No/denies                Social History   Tobacco Use  Smoking Status Never  Smokeless Tobacco Never    Goals Met:  Independence with exercise equipment Exercise tolerated well No report of concerns or symptoms today Strength training completed today  Goals Unmet:  Not Applicable  Comments: Pt able to follow exercise prescription today without complaint.  Will continue to monitor for progression.    Dr. Firman Hughes is Medical Director for [facility] Cardiac Rehabilitation.  Dr. Fuad Aleskerov is Medical Director for [facility] Pulmonary Rehabilitation.

## 2023-06-02 ENCOUNTER — Encounter: Payer: Self-pay | Admitting: Nurse Practitioner

## 2023-06-02 ENCOUNTER — Ambulatory Visit (INDEPENDENT_AMBULATORY_CARE_PROVIDER_SITE_OTHER)

## 2023-06-02 DIAGNOSIS — E039 Hypothyroidism, unspecified: Secondary | ICD-10-CM | POA: Diagnosis not present

## 2023-06-02 LAB — T4, FREE: Free T4: 0.82 ng/dL (ref 0.60–1.60)

## 2023-06-02 LAB — T3, FREE: T3, Free: 2.9 pg/mL (ref 2.3–4.2)

## 2023-06-02 NOTE — Addendum Note (Signed)
 Addended by: Darcella Earnest on: 06/02/2023 08:34 AM   Modules accepted: Orders

## 2023-06-06 ENCOUNTER — Encounter: Payer: Self-pay | Admitting: Nurse Practitioner

## 2023-06-06 ENCOUNTER — Encounter: Admitting: *Deleted

## 2023-06-06 DIAGNOSIS — Z955 Presence of coronary angioplasty implant and graft: Secondary | ICD-10-CM

## 2023-06-06 NOTE — Progress Notes (Signed)
 Daily Session Note  Patient Details  Name: Amy Lin MRN: [id number] Date of Birth: [birth year] Referring Provider:   Flowsheet Row Cardiac Rehab from 05/22/2023 in [facility] Cardiac and Pulmonary Rehab  Referring Provider Dr. Fransico Ivy, MD       Encounter Date: 06/06/2023  Check In:  Session Check In - 06/06/23 0930       Check-In   Supervising physician immediately available to respond to emergencies See telemetry face sheet for immediately available ER MD    Location [facility]-Cardiac & Pulmonary Rehab    Staff Present Maud Sorenson, RN, BSN, CCRP;Margaret Best, MS, Exercise Physiologist;Maxon Conetta BS, Exercise Physiologist;Noah Tickle, BS, Exercise Physiologist    Virtual Visit No    Medication changes reported     No    Fall or balance concerns reported    No    Warm-up and Cool-down Performed on first and last piece of equipment    Resistance Training Performed Yes    VAD Patient? No    PAD/SET Patient? No      Pain Assessment   Currently in Pain? No/denies                Social History   Tobacco Use  Smoking Status Never  Smokeless Tobacco Never    Goals Met:  Independence with exercise equipment Exercise tolerated well No report of concerns or symptoms today  Goals Unmet:  Not Applicable  Comments: Pt able to follow exercise prescription today without complaint.  Will continue to monitor for progression.    Dr. Firman Hughes is Medical Director for [facility] Cardiac Rehabilitation.  Dr. Fuad Aleskerov is Medical Director for [facility] Pulmonary Rehabilitation.

## 2023-06-07 ENCOUNTER — Encounter: Payer: Self-pay | Admitting: *Deleted

## 2023-06-07 DIAGNOSIS — Z955 Presence of coronary angioplasty implant and graft: Secondary | ICD-10-CM

## 2023-06-07 NOTE — Progress Notes (Signed)
 Cardiac Individual Treatment Plan  Patient Details  Name: Amy Lin MRN: [id number] Date of Birth: 28-Sep-1933 Referring Provider:   Flowsheet Row Cardiac Rehab from 05/22/2023 in [facility] Cardiac and Pulmonary Rehab  Referring Provider Dr. Fransico Ivy, MD       Initial Encounter Date:  Flowsheet Row Cardiac Rehab from 05/22/2023 in [facility] Cardiac and Pulmonary Rehab  Date 05/22/23       Visit Diagnosis: Status post coronary artery stent placement  Patient's Home Medications on Admission:  Current Outpatient Medications:    aspirin  81 MG EC tablet, Take 81 mg by mouth daily before breakfast., Disp: , Rfl:    Cholecalciferol (VITAMIN D3) 2000 UNITS TABS, Take 2,000 Units by mouth every evening., Disp: , Rfl:    clopidogrel  (PLAVIX ) 75 MG tablet, Take 1 tablet (75 mg total) by mouth daily with breakfast., Disp: 90 tablet, Rfl: 3   denosumab  (PROLIA ) 60 MG/ML SOSY injection, Inject 60 mg into the skin every 6 (six) months. (Patient not taking: Reported on 06/01/2023), Disp: , Rfl:    ezetimibe  (ZETIA ) 10 MG tablet, Take 1 tablet (10 mg total) by mouth daily. (Patient taking differently: Take 10 mg by mouth at bedtime.), Disp: 90 tablet, Rfl: 2   fluticasone  (FLONASE ) 50 MCG/ACT nasal spray, PLACE ONE SPRAY INTO BOTH NOSTRILS DAILY (Patient taking differently: Place 1 spray into both nostrils daily as needed for allergies.), Disp: 16 mL, Rfl: 2   furosemide  (LASIX ) 20 MG tablet, Take 1 tablet (20 mg total) by mouth as needed for fluid., Disp: 90 tablet, Rfl: 3   hydrocortisone  2.5 % lotion, Apply 1 Application topically 2 (two) times daily as needed (bug bites/skin irritation.)., Disp: 59 mL, Rfl: 1   levocetirizine (XYZAL) 5 MG tablet, Take 5 mg by mouth daily as needed for allergies., Disp: , Rfl:    levothyroxine  (SYNTHROID ) 75 MCG tablet, TAKE ONE TABLET BY MOUTH ONCE A DAY. TAKE ON AN EMPTY STOMACH WITH A GLASS OF WATER ATLEAST 30-60 MIN BEFORE BREAKFAST, Disp: 90 tablet, Rfl: 1    lisinopril  (ZESTRIL ) 5 MG tablet, TAKE ONE TABLET BY MOUTH ONCE A DAY (Patient taking differently: Take 5 mg by mouth at bedtime.), Disp: 90 tablet, Rfl: PRN   Multiple Vitamins-Minerals (PRESERVISION AREDS 2 PO), Take 1 capsule by mouth in the morning and at bedtime., Disp: , Rfl:    Naphazoline-Pheniramine (OPCON-A) 0.027-0.315 % SOLN, Place 1-2 drops into both eyes 3 (three) times daily as needed (irritated/red eyes)., Disp: , Rfl:    nitroGLYCERIN  (NITROSTAT ) 0.4 MG SL tablet, Place 1 tablet (0.4 mg total) under the tongue every 5 (five) minutes as needed for chest pain., Disp: 30 tablet, Rfl: 3   ondansetron  (ZOFRAN -ODT) 4 MG disintegrating tablet, Take 1 tablet (4 mg total) by mouth every 8 (eight) hours as needed for nausea or vomiting., Disp: 10 tablet, Rfl: 0   pantoprazole  (PROTONIX ) 20 MG tablet, TAKE ONE TABLET BY MOUTH TWICE A DAY, Disp: 180 tablet, Rfl: 2   potassium chloride  (KLOR-CON  M) 10 MEQ tablet, Take 1 tablet (10 mEq total) by mouth daily as needed (with use of lasix )., Disp: 30 tablet, Rfl: 3   potassium chloride  (KLOR-CON ) 10 MEQ tablet, Take 10 mEq by mouth daily., Disp: , Rfl:    rosuvastatin  (CRESTOR ) 20 MG tablet, TAKE ONE TABLET BY MOUTH ONCE A DAY (Patient taking differently: Take 20 mg by mouth every evening.), Disp: 90 tablet, Rfl: PRN  Past Medical History: Past Medical History:  Diagnosis Date   Allergy  Cataract Removed   Coronary artery disease    GERD (gastroesophageal reflux disease)    Hyperlipidemia    Hypertension    diuretic for edema at first   Hypothyroidism    Myocardial infarction [facility])    Neuromuscular disorder (HCC) Don't know   Osteoarthritis, multiple sites    Osteoporosis    Squamous cell carcinoma of skin 12/08/2022   Right pretibial. WD SCC. Mohs 01/31/23    Tobacco Use: Social History   Tobacco Use  Smoking Status Never  Smokeless Tobacco Never    Labs: Review Flowsheet  More data exists      Latest Ref Rng & Units  05/31/2022 07/14/2022 07/15/2022 04/17/2023 06/01/2023  Labs for ITP Cardiac and Pulmonary Rehab  Cholestrol 0 - 200 mg/dL 409  - - 811  914   LDL (calc) 0 - 99 mg/dL 782  - 82     59  42   HDL-C >39.00 mg/dL 95.62  - 54     52  13.08   Trlycerides 0.0 - 149.0 mg/dL 657.8  - 469     629  528.4   Hemoglobin A1c 4.6 - 6.5 % - 6.6  C    - - 6.7     Details      C Corrected result   This result is from an external source.          Exercise Target Goals: Exercise Program Goal: Individual exercise prescription set using results from initial 6 min walk test and THRR while considering  patient's activity barriers and safety.   Exercise Prescription Goal: Initial exercise prescription builds to 30-45 minutes a day of aerobic activity, 2-3 days per week.  Home exercise guidelines will be given to patient during program as part of exercise prescription that the participant will acknowledge.   Education: Aerobic Exercise: - Group verbal and visual presentation on the components of exercise prescription. Introduces F.I.T.T principle from ACSM for exercise prescriptions.  Reviews F.I.T.T. principles of aerobic exercise including progression. Written material given at graduation. Flowsheet Row Cardiac Rehab from 06/11/2020 in [facility] Cardiac and Pulmonary Rehab  Date 05/21/20  Educator KL  Instruction Review Code 1- Verbalizes Understanding       Education: Resistance Exercise: - Group verbal and visual presentation on the components of exercise prescription. Introduces F.I.T.T principle from ACSM for exercise prescriptions  Reviews F.I.T.T. principles of resistance exercise including progression. Written material given at graduation. Flowsheet Row Cardiac Rehab from 06/11/2020 in [facility] Cardiac and Pulmonary Rehab  Date 05/28/20  Educator [facility]  Instruction Review Code 1- Verbalizes Understanding        Education: Exercise & Equipment Safety: - Individual verbal instruction and demonstration of  equipment use and safety with use of the equipment. Flowsheet Row Cardiac Rehab from 05/22/2023 in [facility] Cardiac and Pulmonary Rehab  Date 05/10/23  Educator jh  Instruction Review Code 1- Verbalizes Understanding       Education: Exercise Physiology & General Exercise Guidelines: - Group verbal and written instruction with models to review the exercise physiology of the cardiovascular system and associated critical values. Provides general exercise guidelines with specific guidelines to those with heart or lung disease.  Flowsheet Row Cardiac Rehab from 06/11/2020 in [facility] Cardiac and Pulmonary Rehab  Date 05/14/20  Educator [facility]  Instruction Review Code 1- Verbalizes Understanding       Education: Flexibility, Balance, Mind/Body Relaxation: - Group verbal and visual presentation with interactive activity on the components of exercise prescription. Introduces F.I.T.T principle  from ACSM for exercise prescriptions. Reviews F.I.T.T. principles of flexibility and balance exercise training including progression. Also discusses the mind body connection.  Reviews various relaxation techniques to help reduce and manage stress (i.e. Deep breathing, progressive muscle relaxation, and visualization). Balance handout provided to take home. Written material given at graduation. Flowsheet Row Cardiac Rehab from 06/11/2020 in [facility] Cardiac and Pulmonary Rehab  Date 06/04/20  Educator AS  Instruction Review Code 1- Verbalizes Understanding       Activity Barriers & Risk Stratification:  Activity Barriers & Cardiac Risk Stratification - 05/22/23 1527       Activity Barriers & Cardiac Risk Stratification   Activity Barriers Arthritis;Joint Problems;Muscular Weakness    Cardiac Risk Stratification Moderate             6 Minute Walk:  6 Minute Walk     Row Name 05/22/23 1530         6 Minute Walk   Phase Initial     Distance 1210 feet     Walk Time 6 minutes     # of Rest Breaks 0      MPH 2.29     METS 2.15     RPE 12     Perceived Dyspnea  1     VO2 Peak 7.53     Symptoms No     Resting HR 60 bpm     Resting BP 116/58     Resting Oxygen Saturation  97 %     Exercise Oxygen Saturation  during 6 min walk 97 %     Max Ex. HR 101 bpm     Max Ex. BP 152/68     2 Minute Post BP 148/66              Oxygen Initial Assessment:   Oxygen Re-Evaluation:   Oxygen Discharge (Final Oxygen Re-Evaluation):   Initial Exercise Prescription:  Initial Exercise Prescription - 05/22/23 1500       Date of Initial Exercise RX and Referring Provider   Date 05/22/23    Referring Provider Dr. Fransico Ivy, MD      Oxygen   Maintain Oxygen Saturation 88% or higher      Treadmill   MPH 1.8    Grade 0    Minutes 15    METs 2.38      Recumbant Bike   Level 1    RPM 50    Watts 15    Minutes 15    METs 2.15      NuStep   Level 2    SPM 80    Minutes 15    METs 2.15      Biostep-RELP   Level 1    SPM 50    Minutes 15    METs 2.15      Prescription Details   Frequency (times per week) 2    Duration Progress to 30 minutes of continuous aerobic without signs/symptoms of physical distress      Intensity   THRR 40-80% of Max Heartrate 89-118    Ratings of Perceived Exertion 11-13    Perceived Dyspnea 0-4      Progression   Progression Continue to progress workloads to maintain intensity without signs/symptoms of physical distress.      Resistance Training   Training Prescription Yes    Weight 2 lb    Reps 10-15             Perform Capillary Blood Glucose checks  as needed.  Exercise Prescription Changes:   Exercise Prescription Changes     Row Name 05/22/23 1500             Response to Exercise   Blood Pressure (Admit) 116/58       Blood Pressure (Exercise) 152/68       Blood Pressure (Exit) 148/66       Heart Rate (Admit) 60 bpm       Heart Rate (Exercise) 101 bpm       Heart Rate (Exit) 67 bpm       Oxygen Saturation  (Admit) 97 %       Oxygen Saturation (Exercise) 97 %       Rating of Perceived Exertion (Exercise) 12       Perceived Dyspnea (Exercise) 1       Symptoms none       Comments Results                Exercise Comments:   Exercise Comments     Row Name 05/30/23 0945           Exercise Comments First full day of exercise!  Patient was oriented to gym and equipment including functions, settings, policies, and procedures.  Patient's individual exercise prescription and treatment plan were reviewed.  All starting workloads were established based on the results of the 6 minute walk test done at initial orientation visit.  The plan for exercise progression was also introduced and progression will be customized based on patient's performance and goals.                Exercise Goals and Review:   Exercise Goals     Row Name 05/22/23 1526             Exercise Goals   Increase Physical Activity Yes       Intervention Provide advice, education, support and counseling about physical activity/exercise needs.;Develop an individualized exercise prescription for aerobic and resistive training based on initial evaluation findings, risk stratification, comorbidities and participant's personal goals.       Expected Outcomes Short Term: Attend rehab on a regular basis to increase amount of physical activity.;Long Term: Add in home exercise to make exercise part of routine and to increase amount of physical activity.;Long Term: Exercising regularly at least 3-5 days a week.       Increase Strength and Stamina Yes       Intervention Provide advice, education, support and counseling about physical activity/exercise needs.;Develop an individualized exercise prescription for aerobic and resistive training based on initial evaluation findings, risk stratification, comorbidities and participant's personal goals.       Expected Outcomes Short Term: Increase workloads from initial exercise  prescription for resistance, speed, and METs.;Short Term: Perform resistance training exercises routinely during rehab and add in resistance training at home;Long Term: Improve cardiorespiratory fitness, muscular endurance and strength as measured by increased METs and functional capacity ( )       Able to understand and use rate of perceived exertion (RPE) scale Yes       Intervention Provide education and explanation on how to use RPE scale       Expected Outcomes Short Term: Able to use RPE daily in rehab to express subjective intensity level;Long Term:  Able to use RPE to guide intensity level when exercising independently       Able to understand and use Dyspnea scale Yes       Intervention Provide education  and explanation on how to use Dyspnea scale       Expected Outcomes Short Term: Able to use Dyspnea scale daily in rehab to express subjective sense of shortness of breath during exertion;Long Term: Able to use Dyspnea scale to guide intensity level when exercising independently       Knowledge and understanding of Target Heart Rate Range (THRR) Yes       Intervention Provide education and explanation of THRR including how the numbers were predicted and where they are located for reference       Expected Outcomes Short Term: Able to state/look up THRR;Short Term: Able to use daily as guideline for intensity in rehab;Long Term: Able to use THRR to govern intensity when exercising independently       Able to check pulse independently Yes       Intervention Provide education and demonstration on how to check pulse in carotid and radial arteries.;Review the importance of being able to check your own pulse for safety during independent exercise       Expected Outcomes Short Term: Able to explain why pulse checking is important during independent exercise;Long Term: Able to check pulse independently and accurately       Understanding of Exercise Prescription Yes       Intervention Provide  education, explanation, and written materials on patient's individual exercise prescription       Expected Outcomes Short Term: Able to explain program exercise prescription;Long Term: Able to explain home exercise prescription to exercise independently                Exercise Goals Re-Evaluation :  Exercise Goals Re-Evaluation     Row Name 05/30/23 0945             Exercise Goal Re-Evaluation   Exercise Goals Review Understanding of Exercise Prescription;Knowledge and understanding of Target Heart Rate Range (THRR);Able to understand and use Dyspnea scale;Able to understand and use rate of perceived exertion (RPE) scale       Comments Reviewed RPE and dyspnea scale, THR and program prescription with pt today. Pt voiced understanding and was given a copy of goals to take home.       Expected Outcomes Short: Use RPE daily to regulate intensity.  Long: Follow program prescription in THR.                Discharge Exercise Prescription (Final Exercise Prescription Changes):  Exercise Prescription Changes - 05/22/23 1500       Response to Exercise   Blood Pressure (Admit) 116/58    Blood Pressure (Exercise) 152/68    Blood Pressure (Exit) 148/66    Heart Rate (Admit) 60 bpm    Heart Rate (Exercise) 101 bpm    Heart Rate (Exit) 67 bpm    Oxygen Saturation (Admit) 97 %    Oxygen Saturation (Exercise) 97 %    Rating of Perceived Exertion (Exercise) 12    Perceived Dyspnea (Exercise) 1    Symptoms none    Comments Results             Nutrition:  Target Goals: Understanding of nutrition guidelines, daily intake of sodium 1500mg , cholesterol 200mg , calories 30% from fat and 7% or less from saturated fats, daily to have 5 or more servings of fruits and vegetables.  Education: All About Nutrition: -Group instruction provided by verbal, written material, interactive activities, discussions, models, and posters to present general guidelines for heart healthy nutrition  including fat, fiber, MyPlate, the  role of sodium in heart healthy nutrition, utilization of the nutrition label, and utilization of this knowledge for meal planning. Follow up email sent as well. Written material given at graduation. Flowsheet Row Cardiac Rehab from 06/11/2020 in [facility] Cardiac and Pulmonary Rehab  Date 06/11/20  Educator [facility]  Instruction Review Code 1- Verbalizes Understanding       Biometrics:  Pre Biometrics - 05/22/23 1527       Pre Biometrics   Height 5\' 5"  (1.651 m)    Weight 142 lb 3.2 oz (64.5 kg)    Waist Circumference 34.5 inches    Hip Circumference 38.5 inches    Waist to Hip Ratio 0.9 %    BMI (Calculated) 23.66    Single Leg Stand 25.2 seconds              Nutrition Therapy Plan and Nutrition Goals:  Nutrition Therapy & Goals - 05/22/23 1428       Nutrition Therapy   Diet Cardiac, Low Na    Protein (specify units) 65    Fiber 25 grams    Whole Grain Foods 3 servings    Saturated Fats 15 max. grams    Fruits and Vegetables 5 servings/day    Sodium 2 grams      Personal Nutrition Goals   Nutrition Goal Eat 15-30gProtein and 30-60gCarbs at each meal.    Personal Goal #2 Drink 48oz of water daily    Personal Goal #3 Include more veggies and/or fruit    Comments Patient drinking ~32oz of water daily. She feels she could do better, set goal to aim for ~48oz. She eats 3 times per day, he meals are often balanced with protein and carbs portioned well. She likes veggies and eats them daily, though she would like to include more in her diet. Brainstormed some meals ideas to help her include more colorful produce. She does like sweets and eats them after her dinner often. Says its a habit, and will work on improving. She doesn't salt her foods often, but does likes some salty snacks. Encouraged her to look for lower sodium options and try to do less salt. Overall, she is doing very well, eating enough to support her body's needs. Will continue to monitor  and support.      Intervention Plan   Intervention Prescribe, educate and counsel regarding individualized specific dietary modifications aiming towards targeted core components such as weight, hypertension, lipid management, diabetes, heart failure and other comorbidities.;Nutrition handout(s) given to patient.    Expected Outcomes Short Term Goal: Understand basic principles of dietary content, such as calories, fat, sodium, cholesterol and nutrients.;Short Term Goal: A plan has been developed with personal nutrition goals set during dietitian appointment.;Long Term Goal: Adherence to prescribed nutrition plan.             Nutrition Assessments:  MEDIFICTS Score Key: >=70 Need to make dietary changes  40-70 Heart Healthy Diet <= 40 Therapeutic Level Cholesterol Diet  Flowsheet Row Cardiac Rehab from 05/22/2023 in [facility] Cardiac and Pulmonary Rehab  Picture Your Plate Total Score on Admission 57      Picture Your Plate Scores: <09 Unhealthy dietary pattern with much room for improvement. 41-50 Dietary pattern unlikely to meet recommendations for good health and room for improvement. 51-60 More healthful dietary pattern, with some room for improvement.  >60 Healthy dietary pattern, although there may be some specific behaviors that could be improved.    Nutrition Goals Re-Evaluation:   Nutrition Goals Discharge (Final  Nutrition Goals Re-Evaluation):   Psychosocial: Target Goals: Acknowledge presence or absence of significant depression and/or stress, maximize coping skills, provide positive support system. Participant is able to verbalize types and ability to use techniques and skills needed for reducing stress and depression.   Education: Stress, Anxiety, and Depression - Group verbal and visual presentation to define topics covered.  Reviews how body is impacted by stress, anxiety, and depression.  Also discusses healthy ways to reduce stress and to treat/manage anxiety and  depression.  Written material given at graduation. Flowsheet Row Cardiac Rehab from 05/22/2023 in [facility] Cardiac and Pulmonary Rehab  Education need identified 05/22/23       Education: Sleep Hygiene -Provides group verbal and written instruction about how sleep can affect your health.  Define sleep hygiene, discuss sleep cycles and impact of sleep habits. Review good sleep hygiene tips.    Initial Review & Psychosocial Screening:  Initial Psych Review & Screening - 05/10/23 1344       Initial Review   Current issues with None Identified      Family Dynamics   Good Support System? Yes    Comments Malvine can look to her son, daughter, friends and church for support. She takes no medications for her mood and has done Cardiac rehab before and knows what to expect.      Barriers   Psychosocial barriers to participate in program There are no identifiable barriers or psychosocial needs.;The patient should benefit from training in stress management and relaxation.      Screening Interventions   Interventions Encouraged to exercise;Provide feedback about the scores to participant;To provide support and resources with identified psychosocial needs    Expected Outcomes Long Term Goal: Stressors or current issues are controlled or eliminated.;Short Term goal: Utilizing psychosocial counselor, staff and physician to assist with identification of specific Stressors or current issues interfering with healing process. Setting desired goal for each stressor or current issue identified.;Short Term goal: Identification and review with participant of any Quality of Life or Depression concerns found by scoring the questionnaire.;Long Term goal: The participant improves quality of Life and PHQ9 Scores as seen by post scores and/or verbalization of changes             Quality of Life Scores:   Quality of Life - 05/22/23 1524       Quality of Life   Select Quality of Life      Quality of Life Scores    Health/Function Pre 22.1 %    Socioeconomic Pre 25 %    Psych/Spiritual Pre 26.21 %    Family Pre 25.8 %    GLOBAL Pre 24.11 %            Scores of 19 and below usually indicate a poorer quality of life in these areas.  A difference of  2-3 points is a clinically meaningful difference.  A difference of 2-3 points in the total score of the Quality of Life Index has been associated with significant improvement in overall quality of life, self-image, physical symptoms, and general health in studies assessing change in quality of life.  PHQ-9: Review Flowsheet  More data exists      06/01/2023 05/22/2023 03/20/2023 12/26/2022 05/31/2022  Depression screen PHQ 2/9  Decreased Interest 0 0 0 0 0  Down, Depressed, Hopeless 0 0 0 0 0  PHQ - 2 Score 0 0 0 0 0  Altered sleeping 1 1 - - 0  Tired, decreased energy 1 2 - -  2  Change in appetite 0 0 - - 1  Feeling bad or failure about yourself  0 0 - - 1  Trouble concentrating 0 0 - - 0  Moving slowly or fidgety/restless 0 0 - - 0  Suicidal thoughts 0 0 - - 0  PHQ-9 Score 2 3 - - 4  Difficult doing work/chores Not difficult at all Somewhat difficult - - Somewhat difficult   Interpretation of Total Score  Total Score Depression Severity:  1-4 = Minimal depression, 5-9 = Mild depression, 10-14 = Moderate depression, 15-19 = Moderately severe depression, 20-27 = Severe depression   Psychosocial Evaluation and Intervention:  Psychosocial Evaluation - 05/10/23 1346       Psychosocial Evaluation & Interventions   Interventions Encouraged to exercise with the program and follow exercise prescription;Relaxation education;Stress management education    Comments Naziah can look to her son, daughter, friends and church for support. She takes no medications for her mood and has done Cardiac rehab before and knows what to expect.    Expected Outcomes Short: Start [facility] to help with mood. Long: Maintain a healthy mental state    Continue  Psychosocial Services  Follow up required by staff             Psychosocial Re-Evaluation:   Psychosocial Discharge (Final Psychosocial Re-Evaluation):   Vocational Rehabilitation: Provide vocational rehab assistance to qualifying candidates.   Vocational Rehab Evaluation & Intervention:   Education: Education Goals: Education classes will be provided on a variety of topics geared toward better understanding of heart health and risk factor modification. Participant will state understanding/return demonstration of topics presented as noted by education test scores.  Learning Barriers/Preferences:  Learning Barriers/Preferences - 05/10/23 1343       Learning Barriers/Preferences   Learning Barriers None    Learning Preferences None             General Cardiac Education Topics:  AED/CPR: - Group verbal and written instruction with the use of models to demonstrate the basic use of the AED with the basic ABC's of resuscitation.   Anatomy and Cardiac Procedures: - Group verbal and visual presentation and models provide information about basic cardiac anatomy and function. Reviews the testing methods done to diagnose heart disease and the outcomes of the test results. Describes the treatment choices: Medical Management, Angioplasty, or Coronary Bypass Surgery for treating various heart conditions including Myocardial Infarction, Angina, Valve Disease, and Cardiac Arrhythmias.  Written material given at graduation. Flowsheet Row Cardiac Rehab from 06/11/2020 in [facility] Cardiac and Pulmonary Rehab  Date 05/28/20  Educator [facility]  Instruction Review Code 1- Verbalizes Understanding       Medication Safety: - Group verbal and visual instruction to review commonly prescribed medications for heart and lung disease. Reviews the medication, class of the drug, and side effects. Includes the steps to properly store meds and maintain the prescription regimen.  Written material given at  graduation. Flowsheet Row Cardiac Rehab from 06/11/2020 in [facility] Cardiac and Pulmonary Rehab  Date 04/16/20  Educator SB  Instruction Review Code 1- Verbalizes Understanding       Intimacy: - Group verbal instruction through game format to discuss how heart and lung disease can affect sexual intimacy. Written material given at graduation.. Flowsheet Row Cardiac Rehab from 06/11/2020 in [facility] Cardiac and Pulmonary Rehab  Date 05/21/20  Educator KL  Instruction Review Code 1- Verbalizes Understanding       Know Your Numbers and Heart Failure: - Group verbal and  visual instruction to discuss disease risk factors for cardiac and pulmonary disease and treatment options.  Reviews associated critical values for Overweight/Obesity, Hypertension, Cholesterol, and Diabetes.  Discusses basics of heart failure: signs/symptoms and treatments.  Introduces Heart Failure Zone chart for action plan for heart failure.  Written material given at graduation. Flowsheet Row Cardiac Rehab from 05/22/2023 in [facility] Cardiac and Pulmonary Rehab  Education need identified 05/22/23       Infection Prevention: - Provides verbal and written material to individual with discussion of infection control including proper hand washing and proper equipment cleaning during exercise session. Flowsheet Row Cardiac Rehab from 05/22/2023 in [facility] Cardiac and Pulmonary Rehab  Date 05/10/23  Educator jh  Instruction Review Code 1- Verbalizes Understanding       Falls Prevention: - Provides verbal and written material to individual with discussion of falls prevention and safety. Flowsheet Row Cardiac Rehab from 05/22/2023 in [facility] Cardiac and Pulmonary Rehab  Date 05/10/23  Educator jh  Instruction Review Code 1- Verbalizes Understanding       Other: -Provides group and verbal instruction on various topics (see comments)   Knowledge Questionnaire Score:  Knowledge Questionnaire Score - 05/22/23 1524       Knowledge  Questionnaire Score   Pre Score 24/26             Core Components/Risk Factors/Patient Goals at Admission:  Personal Goals and Risk Factors at Admission - 05/10/23 1343       Core Components/Risk Factors/Patient Goals on Admission    Weight Management Yes;Weight Maintenance;Weight Loss    Intervention Weight Management: Develop a combined nutrition and exercise program designed to reach desired caloric intake, while maintaining appropriate intake of nutrient and fiber, sodium and fats, and appropriate energy expenditure required for the weight goal.;Weight Management: Provide education and appropriate resources to help participant work on and attain dietary goals.;Weight Management/Obesity: Establish reasonable short term and long term weight goals.    Expected Outcomes Short Term: Continue to assess and modify interventions until short term weight is achieved;Long Term: Adherence to nutrition and physical activity/exercise program aimed toward attainment of established weight goal;Weight Loss: Understanding of general recommendations for a balanced deficit meal plan, which promotes 1-2 lb weight loss per week and includes a negative energy balance of [phone] kcal/d;Understanding recommendations for meals to include 15-35% energy as protein, 25-35% energy from fat, 35-60% energy from carbohydrates, less than 200mg  of dietary cholesterol, 20-35 gm of total fiber daily;Understanding of distribution of calorie intake throughout the day with the consumption of 4-5 meals/snacks;Weight Maintenance: Understanding of the daily nutrition guidelines, which includes 25-35% calories from fat, 7% or less cal from saturated fats, less than 200mg  cholesterol, less than 1.5gm of sodium, & 5 or more servings of fruits and vegetables daily    Hypertension Yes    Intervention Provide education on lifestyle modifcations including regular physical activity/exercise, weight management, moderate sodium restriction and  increased consumption of fresh fruit, vegetables, and low fat dairy, alcohol moderation, and smoking cessation.;Monitor prescription use compliance.    Expected Outcomes Short Term: Continued assessment and intervention until BP is < 140/71mm HG in hypertensive participants. < 130/39mm HG in hypertensive participants with diabetes, heart failure or chronic kidney disease.;Long Term: Maintenance of blood pressure at goal levels.    Lipids Yes    Intervention Provide education and support for participant on nutrition & aerobic/resistive exercise along with prescribed medications to achieve LDL 70mg , HDL >40mg .    Expected Outcomes Short Term: Participant states  understanding of desired cholesterol values and is compliant with medications prescribed. Participant is following exercise prescription and nutrition guidelines.;Long Term: Cholesterol controlled with medications as prescribed, with individualized exercise RX and with personalized nutrition plan. Value goals: LDL < 70mg , HDL > 40 mg.             Education:Diabetes - Individual verbal and written instruction to review signs/symptoms of diabetes, desired ranges of glucose level fasting, after meals and with exercise. Acknowledge that pre and post exercise glucose checks will be done for 3 sessions at entry of program.   Core Components/Risk Factors/Patient Goals Review:    Core Components/Risk Factors/Patient Goals at Discharge (Final Review):    ITP Comments:  ITP Comments     Row Name 05/10/23 1342 05/22/23 1520 05/30/23 0945 06/07/23 1338     ITP Comments Virtual Visit completed. Patient informed on EP and RD appointment and 6 Minute walk test. Patient also informed of patient health questionnaires on My Chart. Patient Verbalizes understanding. Visit diagnosis can be found in [facility] 04/21/2023. Completed and gym orientation for cardiac rehab. Initial ITP created and sent for review to Dr. Firman Hughes, Medical Director. First full  day of exercise!  Patient was oriented to gym and equipment including functions, settings, policies, and procedures.  Patient's individual exercise prescription and treatment plan were reviewed.  All starting workloads were established based on the results of the 6 minute walk test done at initial orientation visit.  The plan for exercise progression was also introduced and progression will be customized based on patient's performance and goals. 30 Day review completed. Medical Director ITP review done, changes made as directed, and signed approval by Medical Director.    new to program             Comments:

## 2023-06-08 ENCOUNTER — Encounter: Admitting: *Deleted

## 2023-06-08 DIAGNOSIS — Z955 Presence of coronary angioplasty implant and graft: Secondary | ICD-10-CM

## 2023-06-08 NOTE — Progress Notes (Signed)
 Daily Session Note  Patient Details  Name: Amy Lin MRN: [id number] Date of Birth: 08-14-1933 Referring Provider:   Flowsheet Row Cardiac Rehab from 05/22/2023 in [facility] Cardiac and Pulmonary Rehab  Referring Provider Dr. Fransico Ivy, MD       Encounter Date: 06/08/2023  Check In:  Session Check In - 06/08/23 0951       Check-In   Supervising physician immediately available to respond to emergencies See telemetry face sheet for immediately available ER MD    Location [facility]-Cardiac & Pulmonary Rehab    Staff Present Maud Sorenson, RN, BSN, CCRP;Joseph Hood RCP,RRT,BSRT;Maxon [place] BS, Exercise Physiologist;Jason Martina Sledge RDN,LDN;Meredith Manson Seitz RN,BSN    Virtual Visit No    Medication changes reported     No    Fall or balance concerns reported    No    Warm-up and Cool-down Performed on first and last piece of equipment    Resistance Training Performed Yes    VAD Patient? No    PAD/SET Patient? No      Pain Assessment   Currently in Pain? No/denies                Social History   Tobacco Use  Smoking Status Never  Smokeless Tobacco Never    Goals Met:  Independence with exercise equipment Exercise tolerated well No report of concerns or symptoms today  Goals Unmet:  Not Applicable  Comments: Pt able to follow exercise prescription today without complaint.  Will continue to monitor for progression.    Dr. Firman Hughes is Medical Director for [facility] Cardiac Rehabilitation.  Dr. Fuad Aleskerov is Medical Director for [facility] Pulmonary Rehabilitation.

## 2023-06-13 ENCOUNTER — Encounter

## 2023-06-13 ENCOUNTER — Telehealth: Payer: Self-pay | Admitting: Nurse Practitioner

## 2023-06-13 NOTE — Telephone Encounter (Signed)
 Per your last OV note, pt is continue on Prolia . We have a dose on hand in the office for her. Okay to contact her and get her scheduled? Her last injection was in May 2024.

## 2023-06-14 ENCOUNTER — Encounter

## 2023-06-14 ENCOUNTER — Telehealth: Payer: Self-pay | Admitting: Nurse Practitioner

## 2023-06-14 DIAGNOSIS — Z955 Presence of coronary angioplasty implant and graft: Secondary | ICD-10-CM

## 2023-06-14 NOTE — Telephone Encounter (Unsigned)
 Copied from CRM [id number]. Topic: General - Call Back - No Documentation >> Jun 14, 2023  2:51 PM Clyde Darling P wrote: Reason for CRM: pt advise she receive missed call from Heidi Llamas- advise to pt it was in regards to prolia  , that pcp ok to schedule- pt concern she missed a shot in September and wanted to know did pcp want her to complete bone density first before receiving injection, pt can be reached at [phone]

## 2023-06-14 NOTE — Telephone Encounter (Signed)
 LM for pt to return call.   Creatinine clearance is 44.96

## 2023-06-14 NOTE — Telephone Encounter (Signed)
 Ok to continue as long as blood work is up to date

## 2023-06-14 NOTE — Progress Notes (Signed)
 Daily Session Note  Patient Details  Name: MAUDENA BONTEMPS MRN: [id number] Date of Birth: [birth year] Referring Provider:   Flowsheet Row Cardiac Rehab from 05/22/2023 in [facility] Cardiac and Pulmonary Rehab  Referring Provider Dr. Fransico Ivy, MD       Encounter Date: 06/14/2023  Check In:  Session Check In - 06/14/23 0932       Check-In   Supervising physician immediately available to respond to emergencies See telemetry face sheet for immediately available ER MD    Location [facility]-Cardiac & Pulmonary Rehab    Staff Present Maud Sorenson, RN, BSN, CCRP;Joseph Hood RCP,RRT,BSRT;Maxon [place] BS, Exercise Physiologist;Noah Tickle, BS, Exercise Physiologist    Virtual Visit No    Medication changes reported     No    Fall or balance concerns reported    No    Warm-up and Cool-down Performed on first and last piece of equipment    Resistance Training Performed Yes    VAD Patient? No    PAD/SET Patient? No      Pain Assessment   Currently in Pain? No/denies                Social History   Tobacco Use  Smoking Status Never  Smokeless Tobacco Never    Goals Met:  Independence with exercise equipment Exercise tolerated well No report of concerns or symptoms today  Goals Unmet:  Not Applicable  Comments: Pt able to follow exercise prescription today without complaint.  Will continue to monitor for progression.    Dr. Firman Hughes is Medical Director for [facility] Cardiac Rehabilitation.  Dr. Fuad Aleskerov is Medical Director for [facility] Pulmonary Rehabilitation.

## 2023-06-15 ENCOUNTER — Encounter: Admitting: *Deleted

## 2023-06-15 DIAGNOSIS — Z955 Presence of coronary angioplasty implant and graft: Secondary | ICD-10-CM | POA: Diagnosis not present

## 2023-06-15 NOTE — Progress Notes (Signed)
 Daily Session Note  Patient Details  Name: Amy Lin MRN: [id number] Date of Birth: [birth year] Referring Provider:   Flowsheet Row Cardiac Rehab from 05/22/2023 in [facility] Cardiac and Pulmonary Rehab  Referring Provider Dr. Fransico Ivy, MD       Encounter Date: 06/15/2023  Check In:  Session Check In - 06/15/23 0948       Check-In   Supervising physician immediately available to respond to emergencies See telemetry face sheet for immediately available ER MD    Location [facility]-Cardiac & Pulmonary Rehab    Staff Present Maud Sorenson, RN, BSN, CCRP;Joseph Hood RCP,RRT,BSRT;Margaret Best, MS, Exercise Physiologist;Maxon Conetta BS, Exercise Physiologist    Virtual Visit No    Medication changes reported     No    Fall or balance concerns reported    No    Warm-up and Cool-down Performed on first and last piece of equipment    Resistance Training Performed Yes    VAD Patient? No    PAD/SET Patient? No      Pain Assessment   Currently in Pain? No/denies                Social History   Tobacco Use  Smoking Status Never  Smokeless Tobacco Never    Goals Met:  Independence with exercise equipment Exercise tolerated well No report of concerns or symptoms today  Goals Unmet:  Not Applicable  Comments: Pt able to follow exercise prescription today without complaint.  Will continue to monitor for progression.    Dr. Firman Hughes is Medical Director for [facility] Cardiac Rehabilitation.  Dr. Fuad Aleskerov is Medical Director for [facility] Pulmonary Rehabilitation.

## 2023-06-20 ENCOUNTER — Encounter: Admitting: *Deleted

## 2023-06-20 DIAGNOSIS — Z955 Presence of coronary angioplasty implant and graft: Secondary | ICD-10-CM | POA: Diagnosis not present

## 2023-06-20 NOTE — Progress Notes (Signed)
 Daily Session Note  Patient Details  Name: Amy Lin MRN: [id number] Date of Birth: 1933/05/03 Referring Provider:   Flowsheet Row Cardiac Rehab from 05/22/2023 in [facility] Cardiac and Pulmonary Rehab  Referring Provider Dr. Fransico Ivy, MD       Encounter Date: 06/20/2023  Check In:  Session Check In - 06/20/23 0926       Check-In   Supervising physician immediately available to respond to emergencies See telemetry face sheet for immediately available ER MD    Location [facility]-Cardiac & Pulmonary Rehab    Staff Present Maud Sorenson, RN, BSN, CCRP;Maxon Conetta BS, Exercise Physiologist;Noah Tickle, BS, Exercise Physiologist;Jason Martina Sledge RDN,LDN    Virtual Visit No    Medication changes reported     No    Fall or balance concerns reported    No    Warm-up and Cool-down Performed on first and last piece of equipment    Resistance Training Performed Yes    VAD Patient? No    PAD/SET Patient? No      Pain Assessment   Currently in Pain? No/denies                Social History   Tobacco Use  Smoking Status Never  Smokeless Tobacco Never    Goals Met:  Independence with exercise equipment Exercise tolerated well No report of concerns or symptoms today  Goals Unmet:  Not Applicable  Comments: Pt able to follow exercise prescription today without complaint.  Will continue to monitor for progression.    Dr. Firman Hughes is Medical Director for [facility] Cardiac Rehabilitation.  Dr. Fuad Aleskerov is Medical Director for [facility] Pulmonary Rehabilitation.

## 2023-06-20 NOTE — Telephone Encounter (Signed)
 Spoke with pt. She states that she does not know if she should continue on prolia  therapy. Pt is going to schedule her bone density and would like to wait to see what the results show before making a decision about continuing prolia .

## 2023-06-21 NOTE — Telephone Encounter (Signed)
 Noted. It is advised to continue but she can wait until she has her bone density scan

## 2023-06-22 ENCOUNTER — Encounter: Admitting: *Deleted

## 2023-06-22 DIAGNOSIS — Z955 Presence of coronary angioplasty implant and graft: Secondary | ICD-10-CM

## 2023-06-22 NOTE — Progress Notes (Signed)
 Daily Session Note  Patient Details  Name: Amy Lin MRN: [id number] Date of Birth: 1933/03/19 Referring Provider:   Flowsheet Row Cardiac Rehab from 05/22/2023 in [facility] Cardiac and Pulmonary Rehab  Referring Provider Dr. Fransico Ivy, MD       Encounter Date: 06/22/2023  Check In:  Session Check In - 06/22/23 1015       Check-In   Supervising physician immediately available to respond to emergencies See telemetry face sheet for immediately available ER MD    Location [facility]-Cardiac & Pulmonary Rehab    Staff Present Maud Sorenson, RN, BSN, CCRP;Margaret Best, MS, Exercise Physiologist;Maxon Conetta BS, Exercise Physiologist;Joseph Lacinda Pica RCP,RRT,BSRT    Virtual Visit No    Medication changes reported     No    Fall or balance concerns reported    No    Warm-up and Cool-down Performed on first and last piece of equipment    Resistance Training Performed Yes    VAD Patient? No    PAD/SET Patient? No      Pain Assessment   Currently in Pain? No/denies                Social History   Tobacco Use  Smoking Status Never  Smokeless Tobacco Never    Goals Met:  Independence with exercise equipment Exercise tolerated well No report of concerns or symptoms today  Goals Unmet:  Not Applicable  Comments: Pt able to follow exercise prescription today without complaint.  Will continue to monitor for progression.    Dr. Firman Hughes is Medical Director for [facility] Cardiac Rehabilitation.  Dr. Fuad Aleskerov is Medical Director for [facility] Pulmonary Rehabilitation.

## 2023-06-27 ENCOUNTER — Encounter: Admitting: *Deleted

## 2023-06-27 DIAGNOSIS — Z955 Presence of coronary angioplasty implant and graft: Secondary | ICD-10-CM | POA: Diagnosis not present

## 2023-06-27 NOTE — Progress Notes (Signed)
 Daily Session Note  Patient Details  Name: Amy Lin MRN: [id number] Date of Birth: 03-16-1933 Referring Provider:   Flowsheet Row Cardiac Rehab from 05/22/2023 in [facility] Cardiac and Pulmonary Rehab  Referring Provider Dr. Fransico Ivy, MD       Encounter Date: 06/27/2023  Check In:  Session Check In - 06/27/23 0928       Check-In   Supervising physician immediately available to respond to emergencies See telemetry face sheet for immediately available ER MD    Location [facility]-Cardiac & Pulmonary Rehab    Staff Present Maud Sorenson, RN, BSN, CCRP;Noah Tickle, BS, Exercise Physiologist;Maxon Conetta BS, Exercise Physiologist;Jason Martina Sledge RDN,LDN    Virtual Visit No    Medication changes reported     No    Fall or balance concerns reported    No    Warm-up and Cool-down Performed on first and last piece of equipment    Resistance Training Performed Yes    VAD Patient? No    PAD/SET Patient? No      Pain Assessment   Currently in Pain? No/denies                Social History   Tobacco Use  Smoking Status Never  Smokeless Tobacco Never    Goals Met:  Independence with exercise equipment Exercise tolerated well No report of concerns or symptoms today  Goals Unmet:  Not Applicable  Comments: Pt able to follow exercise prescription today without complaint.  Will continue to monitor for progression.    Dr. Firman Hughes is Medical Director for [facility] Cardiac Rehabilitation.  Dr. Fuad Aleskerov is Medical Director for [facility] Pulmonary Rehabilitation.

## 2023-06-29 ENCOUNTER — Encounter: Admitting: *Deleted

## 2023-06-29 DIAGNOSIS — Z955 Presence of coronary angioplasty implant and graft: Secondary | ICD-10-CM

## 2023-06-29 NOTE — Progress Notes (Signed)
 Daily Session Note  Patient Details  Name: NALY SCHWANZ MRN: [id number] Date of Birth: [birth year] Referring Provider:   Flowsheet Row Cardiac Rehab from 05/22/2023 in [facility] Cardiac and Pulmonary Rehab  Referring Provider Dr. Fransico Ivy, MD       Encounter Date: 06/29/2023  Check In:  Session Check In - 06/29/23 0930       Check-In   Supervising physician immediately available to respond to emergencies See telemetry face sheet for immediately available ER MD    Location [facility]-Cardiac & Pulmonary Rehab    Staff Present Maud Sorenson, RN, BSN, CCRP;Joseph Hood RCP,RRT,BSRT;Margaret Best, MS, Exercise Physiologist;Maxon Conetta BS, Exercise Physiologist    Virtual Visit No    Medication changes reported     No    Fall or balance concerns reported    No    Warm-up and Cool-down Performed on first and last piece of equipment    Resistance Training Performed Yes    VAD Patient? No    PAD/SET Patient? No      Pain Assessment   Currently in Pain? No/denies                Social History   Tobacco Use  Smoking Status Never  Smokeless Tobacco Never    Goals Met:  Independence with exercise equipment Exercise tolerated well No report of concerns or symptoms today  Goals Unmet:  Not Applicable  Comments: Pt able to follow exercise prescription today without complaint.  Will continue to monitor for progression.    Dr. Firman Hughes is Medical Director for [facility] Cardiac Rehabilitation.  Dr. Fuad Aleskerov is Medical Director for [facility] Pulmonary Rehabilitation.

## 2023-07-04 ENCOUNTER — Encounter: Attending: Cardiology

## 2023-07-04 DIAGNOSIS — Z48812 Encounter for surgical aftercare following surgery on the circulatory system: Secondary | ICD-10-CM | POA: Diagnosis not present

## 2023-07-04 DIAGNOSIS — Z955 Presence of coronary angioplasty implant and graft: Secondary | ICD-10-CM | POA: Insufficient documentation

## 2023-07-04 NOTE — Progress Notes (Signed)
 Daily Session Note  Patient Details  Name: Amy Lin MRN: [id number] Date of Birth: 1933-03-30 Referring Provider:   Flowsheet Row Cardiac Rehab from 05/22/2023 in [facility] Cardiac and Pulmonary Rehab  Referring Provider Dr. Fransico Ivy, MD       Encounter Date: 07/04/2023  Check In:  Session Check In - 07/04/23 0912       Check-In   Supervising physician immediately available to respond to emergencies See telemetry face sheet for immediately available ER MD    Location [facility]-Cardiac & Pulmonary Rehab    Staff Present Sherle Dire, BS, Exercise Physiologist;Arcangel Minion RN,BSN,MPA;Maxon Conetta BS, Exercise Physiologist;Jason Martina Sledge RDN,LDN    Virtual Visit No    Medication changes reported     No    Fall or balance concerns reported    No    Warm-up and Cool-down Performed on first and last piece of equipment    Resistance Training Performed Yes    VAD Patient? No    PAD/SET Patient? No      Pain Assessment   Currently in Pain? No/denies                Social History   Tobacco Use  Smoking Status Never  Smokeless Tobacco Never    Goals Met:  Independence with exercise equipment Exercise tolerated well No report of concerns or symptoms today Strength training completed today  Goals Unmet:  Not Applicable  Comments: Pt able to follow exercise prescription today without complaint.  Will continue to monitor for progression.    Dr. Firman Hughes is Medical Director for [facility] Cardiac Rehabilitation.  Dr. Fuad Aleskerov is Medical Director for [facility] Pulmonary Rehabilitation.

## 2023-07-05 ENCOUNTER — Encounter: Payer: Self-pay | Admitting: *Deleted

## 2023-07-05 DIAGNOSIS — Z955 Presence of coronary angioplasty implant and graft: Secondary | ICD-10-CM

## 2023-07-05 NOTE — Progress Notes (Signed)
 Cardiac Individual Treatment Plan  Patient Details  Name: Amy Lin MRN: [id number] Date of Birth: 1933/12/21 Referring Provider:   Flowsheet Row Cardiac Rehab from 05/22/2023 in [facility] Cardiac and Pulmonary Rehab  Referring Provider Dr. Fransico Ivy, MD       Initial Encounter Date:  Flowsheet Row Cardiac Rehab from 05/22/2023 in [facility] Cardiac and Pulmonary Rehab  Date 05/22/23       Visit Diagnosis: Status post coronary artery stent placement  Patient's Home Medications on Admission:  Current Outpatient Medications:    aspirin  81 MG EC tablet, Take 81 mg by mouth daily before breakfast., Disp: , Rfl:    Cholecalciferol (VITAMIN D3) 2000 UNITS TABS, Take 2,000 Units by mouth every evening., Disp: , Rfl:    clopidogrel  (PLAVIX ) 75 MG tablet, Take 1 tablet (75 mg total) by mouth daily with breakfast., Disp: 90 tablet, Rfl: 3   denosumab  (PROLIA ) 60 MG/ML SOSY injection, Inject 60 mg into the skin every 6 (six) months. (Patient not taking: Reported on 06/01/2023), Disp: , Rfl:    ezetimibe  (ZETIA ) 10 MG tablet, Take 1 tablet (10 mg total) by mouth daily. (Patient taking differently: Take 10 mg by mouth at bedtime.), Disp: 90 tablet, Rfl: 2   fluticasone  (FLONASE ) 50 MCG/ACT nasal spray, PLACE ONE SPRAY INTO BOTH NOSTRILS DAILY (Patient taking differently: Place 1 spray into both nostrils daily as needed for allergies.), Disp: 16 mL, Rfl: 2   furosemide  (LASIX ) 20 MG tablet, Take 1 tablet (20 mg total) by mouth as needed for fluid., Disp: 90 tablet, Rfl: 3   hydrocortisone  2.5 % lotion, Apply 1 Application topically 2 (two) times daily as needed (bug bites/skin irritation.)., Disp: 59 mL, Rfl: 1   levocetirizine (XYZAL) 5 MG tablet, Take 5 mg by mouth daily as needed for allergies., Disp: , Rfl:    levothyroxine  (SYNTHROID ) 75 MCG tablet, TAKE ONE TABLET BY MOUTH ONCE A DAY. TAKE ON AN EMPTY STOMACH WITH A GLASS OF WATER ATLEAST 30-60 MIN BEFORE BREAKFAST, Disp: 90 tablet, Rfl: 1    lisinopril  (ZESTRIL ) 5 MG tablet, TAKE ONE TABLET BY MOUTH ONCE A DAY (Patient taking differently: Take 5 mg by mouth at bedtime.), Disp: 90 tablet, Rfl: PRN   Multiple Vitamins-Minerals (PRESERVISION AREDS 2 PO), Take 1 capsule by mouth in the morning and at bedtime., Disp: , Rfl:    Naphazoline-Pheniramine (OPCON-A) 0.027-0.315 % SOLN, Place 1-2 drops into both eyes 3 (three) times daily as needed (irritated/red eyes)., Disp: , Rfl:    nitroGLYCERIN  (NITROSTAT ) 0.4 MG SL tablet, Place 1 tablet (0.4 mg total) under the tongue every 5 (five) minutes as needed for chest pain., Disp: 30 tablet, Rfl: 3   ondansetron  (ZOFRAN -ODT) 4 MG disintegrating tablet, Take 1 tablet (4 mg total) by mouth every 8 (eight) hours as needed for nausea or vomiting., Disp: 10 tablet, Rfl: 0   pantoprazole  (PROTONIX ) 20 MG tablet, TAKE ONE TABLET BY MOUTH TWICE A DAY, Disp: 180 tablet, Rfl: 2   potassium chloride  (KLOR-CON  M) 10 MEQ tablet, Take 1 tablet (10 mEq total) by mouth daily as needed (with use of lasix )., Disp: 30 tablet, Rfl: 3   potassium chloride  (KLOR-CON ) 10 MEQ tablet, Take 10 mEq by mouth daily., Disp: , Rfl:    rosuvastatin  (CRESTOR ) 20 MG tablet, TAKE ONE TABLET BY MOUTH ONCE A DAY (Patient taking differently: Take 20 mg by mouth every evening.), Disp: 90 tablet, Rfl: PRN  Past Medical History: Past Medical History:  Diagnosis Date   Allergy  Cataract Removed   Coronary artery disease    GERD (gastroesophageal reflux disease)    Hyperlipidemia    Hypertension    diuretic for edema at first   Hypothyroidism    Myocardial infarction [facility])    Neuromuscular disorder (HCC) Don't know   Osteoarthritis, multiple sites    Osteoporosis    Squamous cell carcinoma of skin 12/08/2022   Right pretibial. WD SCC. Mohs 01/31/23    Tobacco Use: Social History   Tobacco Use  Smoking Status Never  Smokeless Tobacco Never    Labs: Review Flowsheet  More data exists      Latest Ref Rng & Units  05/31/2022 07/14/2022 07/15/2022 04/17/2023 06/01/2023  Labs for ITP Cardiac and Pulmonary Rehab  Cholestrol 0 - 200 mg/dL 161  - - 096  045   LDL (calc) 0 - 99 mg/dL 409  - 82     59  42   HDL-C >39.00 mg/dL 81.19  - 54     52  14.78   Trlycerides 0.0 - 149.0 mg/dL 295.6  - 213     086  578.4   Hemoglobin A1c 4.6 - 6.5 % - 6.6  C    - - 6.7     Details      C Corrected result   This result is from an external source.          Exercise Target Goals: Exercise Program Goal: Individual exercise prescription set using results from initial 6 min walk test and THRR while considering  patient's activity barriers and safety.   Exercise Prescription Goal: Initial exercise prescription builds to 30-45 minutes a day of aerobic activity, 2-3 days per week.  Home exercise guidelines will be given to patient during program as part of exercise prescription that the participant will acknowledge.   Education: Aerobic Exercise: - Group verbal and visual presentation on the components of exercise prescription. Introduces F.I.T.T principle from ACSM for exercise prescriptions.  Reviews F.I.T.T. principles of aerobic exercise including progression. Written material given at graduation. Flowsheet Row Cardiac Rehab from 06/29/2023 in [facility] Cardiac and Pulmonary Rehab  Date 06/29/23  Educator MB  Instruction Review Code 1- Verbalizes Understanding       Education: Resistance Exercise: - Group verbal and visual presentation on the components of exercise prescription. Introduces F.I.T.T principle from ACSM for exercise prescriptions  Reviews F.I.T.T. principles of resistance exercise including progression. Written material given at graduation. Flowsheet Row Cardiac Rehab from 06/29/2023 in [facility] Cardiac and Pulmonary Rehab  Date 06/22/23  Educator MB  Instruction Review Code 1- [employer] Understanding        Education: Exercise & Equipment Safety: - Individual verbal instruction and demonstration of  equipment use and safety with use of the equipment. Flowsheet Row Cardiac Rehab from 06/29/2023 in [facility] Cardiac and Pulmonary Rehab  Date 05/10/23  Educator jh  Instruction Review Code 1- Verbalizes Understanding       Education: Exercise Physiology & General Exercise Guidelines: - Group verbal and written instruction with models to review the exercise physiology of the cardiovascular system and associated critical values. Provides general exercise guidelines with specific guidelines to those with heart or lung disease.  Flowsheet Row Cardiac Rehab from 06/29/2023 in [facility] Cardiac and Pulmonary Rehab  Date 06/15/23  Educator MB  Instruction Review Code 1- [employer] Understanding       Education: Flexibility, Balance, Mind/Body Relaxation: - Group verbal and visual presentation with interactive activity on the components of exercise prescription. Introduces F.I.T.T principle  from ACSM for exercise prescriptions. Reviews F.I.T.T. principles of flexibility and balance exercise training including progression. Also discusses the mind body connection.  Reviews various relaxation techniques to help reduce and manage stress (i.e. Deep breathing, progressive muscle relaxation, and visualization). Balance handout provided to take home. Written material given at graduation. Flowsheet Row Cardiac Rehab from 06/29/2023 in [facility] Cardiac and Pulmonary Rehab  Date 06/22/23  Educator MB  Instruction Review Code 1- Verbalizes Understanding       Activity Barriers & Risk Stratification:  Activity Barriers & Cardiac Risk Stratification - 05/22/23 1527       Activity Barriers & Cardiac Risk Stratification   Activity Barriers Arthritis;Joint Problems;Muscular Weakness    Cardiac Risk Stratification Moderate             6 Minute Walk:  6 Minute Walk     Row Name 05/22/23 1530         6 Minute Walk   Phase Initial     Distance 1210 feet     Walk Time 6 minutes     # of Rest Breaks 0      MPH 2.29     METS 2.15     RPE 12     Perceived Dyspnea  1     VO2 Peak 7.53     Symptoms No     Resting HR 60 bpm     Resting BP 116/58     Resting Oxygen Saturation  97 %     Exercise Oxygen Saturation  during 6 min walk 97 %     Max Ex. HR 101 bpm     Max Ex. BP 152/68     2 Minute Post BP 148/66              Oxygen Initial Assessment:   Oxygen Re-Evaluation:   Oxygen Discharge (Final Oxygen Re-Evaluation):   Initial Exercise Prescription:  Initial Exercise Prescription - 05/22/23 1500       Date of Initial Exercise RX and Referring Provider   Date 05/22/23    Referring Provider Dr. Fransico Ivy, MD      Oxygen   Maintain Oxygen Saturation 88% or higher      Treadmill   MPH 1.8    Grade 0    Minutes 15    METs 2.38      Recumbant Bike   Level 1    RPM 50    Watts 15    Minutes 15    METs 2.15      NuStep   Level 2    SPM 80    Minutes 15    METs 2.15      Biostep-RELP   Level 1    SPM 50    Minutes 15    METs 2.15      Prescription Details   Frequency (times per week) 2    Duration Progress to 30 minutes of continuous aerobic without signs/symptoms of physical distress      Intensity   THRR 40-80% of Max Heartrate 89-118    Ratings of Perceived Exertion 11-13    Perceived Dyspnea 0-4      Progression   Progression Continue to progress workloads to maintain intensity without signs/symptoms of physical distress.      Resistance Training   Training Prescription Yes    Weight 2 lb    Reps 10-15             Perform Capillary Blood Glucose checks  as needed.  Exercise Prescription Changes:   Exercise Prescription Changes     Row Name 05/22/23 1500 06/12/23 1600 06/29/23 1700         Response to Exercise   Blood Pressure (Admit) 116/58 122/60 110/60     Blood Pressure (Exercise) 152/68 146/64 142/60     Blood Pressure (Exit) 148/66 126/62 120/60     Heart Rate (Admit) 60 bpm 67 bpm 66 bpm     Heart Rate (Exercise)  101 bpm 106 bpm 108 bpm     Heart Rate (Exit) 67 bpm 75 bpm 74 bpm     Oxygen Saturation (Admit) 97 % -- --     Oxygen Saturation (Exercise) 97 % -- --     Rating of Perceived Exertion (Exercise) 12 13 13      Perceived Dyspnea (Exercise) 1 -- --     Symptoms none none none     Comments Results First two weeks of rehab --     Duration -- Continue with 30 min of aerobic exercise without signs/symptoms of physical distress. Continue with 30 min of aerobic exercise without signs/symptoms of physical distress.     Intensity -- THRR unchanged THRR unchanged       Progression   Progression -- Continue to progress workloads to maintain intensity without signs/symptoms of physical distress. Continue to progress workloads to maintain intensity without signs/symptoms of physical distress.     Average METs -- 2.44 2.74       Resistance Training   Training Prescription -- Yes Yes     Weight -- 2 lb 2 lb     Reps -- 10-15 10-15       Interval Training   Interval Training -- No No       Treadmill   MPH -- 2 2.5     Grade -- 0 0     Minutes -- 15 15     METs -- 2.53 2.91       Recumbant Bike   Level -- 2 --     Watts -- 19 --     Minutes -- 15 --       NuStep   Level -- 2 1     Minutes -- 15 15     METs -- 3 2.8       Biostep-RELP   Level -- 1 2     Minutes -- 15 15     METs -- 2 --       Oxygen   Maintain Oxygen Saturation -- 88% or higher 88% or higher              Exercise Comments:   Exercise Comments     Row Name 05/30/23 0945           Exercise Comments First full day of exercise!  Patient was oriented to gym and equipment including functions, settings, policies, and procedures.  Patient's individual exercise prescription and treatment plan were reviewed.  All starting workloads were established based on the results of the 6 minute walk test done at initial orientation visit.  The plan for exercise progression was also introduced and progression will be  customized based on patient's performance and goals.                Exercise Goals and Review:   Exercise Goals     Row Name 05/22/23 1526             Exercise Goals  Increase Physical Activity Yes       Intervention Provide advice, education, support and counseling about physical activity/exercise needs.;Develop an individualized exercise prescription for aerobic and resistive training based on initial evaluation findings, risk stratification, comorbidities and participant's personal goals.       Expected Outcomes Short Term: Attend rehab on a regular basis to increase amount of physical activity.;Long Term: Add in home exercise to make exercise part of routine and to increase amount of physical activity.;Long Term: Exercising regularly at least 3-5 days a week.       Increase Strength and Stamina Yes       Intervention Provide advice, education, support and counseling about physical activity/exercise needs.;Develop an individualized exercise prescription for aerobic and resistive training based on initial evaluation findings, risk stratification, comorbidities and participant's personal goals.       Expected Outcomes Short Term: Increase workloads from initial exercise prescription for resistance, speed, and METs.;Short Term: Perform resistance training exercises routinely during rehab and add in resistance training at home;Long Term: Improve cardiorespiratory fitness, muscular endurance and strength as measured by increased METs and functional capacity ( )       Able to understand and use rate of perceived exertion (RPE) scale Yes       Intervention Provide education and explanation on how to use RPE scale       Expected Outcomes Short Term: Able to use RPE daily in rehab to express subjective intensity level;Long Term:  Able to use RPE to guide intensity level when exercising independently       Able to understand and use Dyspnea scale Yes       Intervention Provide education and  explanation on how to use Dyspnea scale       Expected Outcomes Short Term: Able to use Dyspnea scale daily in rehab to express subjective sense of shortness of breath during exertion;Long Term: Able to use Dyspnea scale to guide intensity level when exercising independently       Knowledge and understanding of Target Heart Rate Range (THRR) Yes       Intervention Provide education and explanation of THRR including how the numbers were predicted and where they are located for reference       Expected Outcomes Short Term: Able to state/look up THRR;Short Term: Able to use daily as guideline for intensity in rehab;Long Term: Able to use THRR to govern intensity when exercising independently       Able to check pulse independently Yes       Intervention Provide education and demonstration on how to check pulse in carotid and radial arteries.;Review the importance of being able to check your own pulse for safety during independent exercise       Expected Outcomes Short Term: Able to explain why pulse checking is important during independent exercise;Long Term: Able to check pulse independently and accurately       Understanding of Exercise Prescription Yes       Intervention Provide education, explanation, and written materials on patient's individual exercise prescription       Expected Outcomes Short Term: Able to explain program exercise prescription;Long Term: Able to explain home exercise prescription to exercise independently                Exercise Goals Re-Evaluation :  Exercise Goals Re-Evaluation     Row Name 05/30/23 0945 06/12/23 1612 06/27/23 0935 06/29/23 1731       Exercise Goal Re-Evaluation   Exercise Goals Review Understanding of  Exercise Prescription;Knowledge and understanding of Target Heart Rate Range (THRR);Able to understand and use Dyspnea scale;Able to understand and use rate of perceived exertion (RPE) scale Increase Physical Activity;Increase Strength and  Stamina;Understanding of Exercise Prescription Increase Physical Activity;Increase Strength and Stamina;Understanding of Exercise Prescription Increase Physical Activity;Increase Strength and Stamina;Understanding of Exercise Prescription    Comments Reviewed RPE and dyspnea scale, THR and program prescription with pt today. Pt voiced understanding and was given a copy of goals to take home. Xanthe is off to a good start in rehab. She did well on the treadmill at a speed of 2 mph with no incline. She also did well at level 2 on the recumbent bike and T4 nustep and level 1 on the biostep. We will continue to monitor her progress in the program. Berdene is doing well with exercise at rehab. She as handweights and a treadmill at home. Says she used to use them consistently but fell out of routine. Set goal to continue to attend rehab and look to add more home exercise. Maleiyah is doing well in rehab. She increased her speed of the treadmill to 2.5 mph with no incline. She also increased to level 2 on the biostep. She decreased back to level 1 on the T4 nustep, but increased her METs to 2.8. We will continue to monitor her progress in the program.    Expected Outcomes Short: Use RPE daily to regulate intensity.  Long: Follow program prescription in THR. Short: Continue to follow current exercise prescription. Long: Continue exercise to improve strength and stamina. STG: continue to attend rehab and look to add more home exercise. LTG: Continue exercise to improve strength and stamina. Short: Try to get back to level 2 on the T4 nustep. Long: Continue exercise to improve strength and stamina.             Discharge Exercise Prescription (Final Exercise Prescription Changes):  Exercise Prescription Changes - 06/29/23 1700       Response to Exercise   Blood Pressure (Admit) 110/60    Blood Pressure (Exercise) 142/60    Blood Pressure (Exit) 120/60    Heart Rate (Admit) 66 bpm    Heart Rate (Exercise) 108 bpm     Heart Rate (Exit) 74 bpm    Rating of Perceived Exertion (Exercise) 13    Symptoms none    Duration Continue with 30 min of aerobic exercise without signs/symptoms of physical distress.    Intensity THRR unchanged      Progression   Progression Continue to progress workloads to maintain intensity without signs/symptoms of physical distress.    Average METs 2.74      Resistance Training   Training Prescription Yes    Weight 2 lb    Reps 10-15      Interval Training   Interval Training No      Treadmill   MPH 2.5    Grade 0    Minutes 15    METs 2.91      NuStep   Level 1    Minutes 15    METs 2.8      Biostep-RELP   Level 2    Minutes 15      Oxygen   Maintain Oxygen Saturation 88% or higher             Nutrition:  Target Goals: Understanding of nutrition guidelines, daily intake of sodium 1500mg , cholesterol 200mg , calories 30% from fat and 7% or less from saturated fats, daily to have  5 or more servings of fruits and vegetables.  Education: All About Nutrition: -Group instruction provided by verbal, written material, interactive activities, discussions, models, and posters to present general guidelines for heart healthy nutrition including fat, fiber, MyPlate, the role of sodium in heart healthy nutrition, utilization of the nutrition label, and utilization of this knowledge for meal planning. Follow up email sent as well. Written material given at graduation. Flowsheet Row Cardiac Rehab from 06/11/2020 in [facility] Cardiac and Pulmonary Rehab  Date 06/11/20  Educator [facility]  Instruction Review Code 1- Verbalizes Understanding       Biometrics:  Pre Biometrics - 05/22/23 1527       Pre Biometrics   Height 5\' 5"  (1.651 m)    Weight 142 lb 3.2 oz (64.5 kg)    Waist Circumference 34.5 inches    Hip Circumference 38.5 inches    Waist to Hip Ratio 0.9 %    BMI (Calculated) 23.66    Single Leg Stand 25.2 seconds              Nutrition Therapy Plan and  Nutrition Goals:  Nutrition Therapy & Goals - 05/22/23 1428       Nutrition Therapy   Diet Cardiac, Low Na    Protein (specify units) 65    Fiber 25 grams    Whole Grain Foods 3 servings    Saturated Fats 15 max. grams    Fruits and Vegetables 5 servings/day    Sodium 2 grams      Personal Nutrition Goals   Nutrition Goal Eat 15-30gProtein and 30-60gCarbs at each meal.    Personal Goal #2 Drink 48oz of water daily    Personal Goal #3 Include more veggies and/or fruit    Comments Patient drinking ~32oz of water daily. She feels she could do better, set goal to aim for ~48oz. She eats 3 times per day, he meals are often balanced with protein and carbs portioned well. She likes veggies and eats them daily, though she would like to include more in her diet. Brainstormed some meals ideas to help her include more colorful produce. She does like sweets and eats them after her dinner often. Says its a habit, and will work on improving. She doesn't salt her foods often, but does likes some salty snacks. Encouraged her to look for lower sodium options and try to do less salt. Overall, she is doing very well, eating enough to support her body's needs. Will continue to monitor and support.      Intervention Plan   Intervention Prescribe, educate and counsel regarding individualized specific dietary modifications aiming towards targeted core components such as weight, hypertension, lipid management, diabetes, heart failure and other comorbidities.;Nutrition handout(s) given to patient.    Expected Outcomes Short Term Goal: Understand basic principles of dietary content, such as calories, fat, sodium, cholesterol and nutrients.;Short Term Goal: A plan has been developed with personal nutrition goals set during dietitian appointment.;Long Term Goal: Adherence to prescribed nutrition plan.             Nutrition Assessments:  MEDIFICTS Score Key: >=70 Need to make dietary changes  40-70 Heart Healthy  Diet <= 40 Therapeutic Level Cholesterol Diet  Flowsheet Row Cardiac Rehab from 05/22/2023 in [facility] Cardiac and Pulmonary Rehab  Picture Your Plate Total Score on Admission 57      Picture Your Plate Scores: <09 Unhealthy dietary pattern with much room for improvement. 41-50 Dietary pattern unlikely to meet recommendations for good health and room  for improvement. 51-60 More healthful dietary pattern, with some room for improvement.  >60 Healthy dietary pattern, although there may be some specific behaviors that could be improved.    Nutrition Goals Re-Evaluation:  Nutrition Goals Re-Evaluation     Row Name 06/27/23 [phone]             Goals   Comment Spoke with Declan about making sure she eats a protein at most of her meals. She likes fruit and includes several servings per day. She is monitoring her salt intake. Encouraged her to continue to read labels and build balanced meals       Expected Outcome STG: continue to read labels and build balanced meals. LTG: Follow a heart healthy lifestyle                Nutrition Goals Discharge (Final Nutrition Goals Re-Evaluation):  Nutrition Goals Re-Evaluation - 06/27/23 0938       Goals   Comment Spoke with Stana Ear about making sure she eats a protein at most of her meals. She likes fruit and includes several servings per day. She is monitoring her salt intake. Encouraged her to continue to read labels and build balanced meals    Expected Outcome STG: continue to read labels and build balanced meals. LTG: Follow a heart healthy lifestyle             Psychosocial: Target Goals: Acknowledge presence or absence of significant depression and/or stress, maximize coping skills, provide positive support system. Participant is able to verbalize types and ability to use techniques and skills needed for reducing stress and depression.   Education: Stress, Anxiety, and Depression - Group verbal and visual presentation to define topics  covered.  Reviews how body is impacted by stress, anxiety, and depression.  Also discusses healthy ways to reduce stress and to treat/manage anxiety and depression.  Written material given at graduation. Flowsheet Row Cardiac Rehab from 06/29/2023 in [facility] Cardiac and Pulmonary Rehab  Education need identified 05/22/23  Date 06/08/23  Educator [facility]  Instruction Review Code 1- [employer] Understanding       Education: Sleep Hygiene -Provides group verbal and written instruction about how sleep can affect your health.  Define sleep hygiene, discuss sleep cycles and impact of sleep habits. Review good sleep hygiene tips.    Initial Review & Psychosocial Screening:  Initial Psych Review & Screening - 05/10/23 1344       Initial Review   Current issues with None Identified      Family Dynamics   Good Support System? Yes    Comments Sham can look to her son, daughter, friends and church for support. She takes no medications for her mood and has done Cardiac rehab before and knows what to expect.      Barriers   Psychosocial barriers to participate in program There are no identifiable barriers or psychosocial needs.;The patient should benefit from training in stress management and relaxation.      Screening Interventions   Interventions Encouraged to exercise;Provide feedback about the scores to participant;To provide support and resources with identified psychosocial needs    Expected Outcomes Long Term Goal: Stressors or current issues are controlled or eliminated.;Short Term goal: Utilizing psychosocial counselor, staff and physician to assist with identification of specific Stressors or current issues interfering with healing process. Setting desired goal for each stressor or current issue identified.;Short Term goal: Identification and review with participant of any Quality of Life or Depression concerns found by scoring the questionnaire.;Long  Term goal: The participant improves quality of  Life and PHQ9 Scores as seen by post scores and/or verbalization of changes             Quality of Life Scores:   Quality of Life - 05/22/23 1524       Quality of Life   Select Quality of Life      Quality of Life Scores   Health/Function Pre 22.1 %    Socioeconomic Pre 25 %    Psych/Spiritual Pre 26.21 %    Family Pre 25.8 %    GLOBAL Pre 24.11 %            Scores of 19 and below usually indicate a poorer quality of life in these areas.  A difference of  2-3 points is a clinically meaningful difference.  A difference of 2-3 points in the total score of the Quality of Life Index has been associated with significant improvement in overall quality of life, self-image, physical symptoms, and general health in studies assessing change in quality of life.  PHQ-9: Review Flowsheet  More data exists      06/01/2023 05/22/2023 03/20/2023 12/26/2022 05/31/2022  Depression screen PHQ 2/9  Decreased Interest 0 0 0 0 0  Down, Depressed, Hopeless 0 0 0 0 0  PHQ - 2 Score 0 0 0 0 0  Altered sleeping 1 1 - - 0  Tired, decreased energy 1 2 - - 2  Change in appetite 0 0 - - 1  Feeling bad or failure about yourself  0 0 - - 1  Trouble concentrating 0 0 - - 0  Moving slowly or fidgety/restless 0 0 - - 0  Suicidal thoughts 0 0 - - 0  PHQ-9 Score 2 3 - - 4  Difficult doing work/chores Not difficult at all Somewhat difficult - - Somewhat difficult   Interpretation of Total Score  Total Score Depression Severity:  1-4 = Minimal depression, 5-9 = Mild depression, 10-14 = Moderate depression, 15-19 = Moderately severe depression, 20-27 = Severe depression   Psychosocial Evaluation and Intervention:  Psychosocial Evaluation - 05/10/23 1346       Psychosocial Evaluation & Interventions   Interventions Encouraged to exercise with the program and follow exercise prescription;Relaxation education;Stress management education    Comments Jestine can look to her son, daughter, friends and church  for support. She takes no medications for her mood and has done Cardiac rehab before and knows what to expect.    Expected Outcomes Short: Start [facility] to help with mood. Long: Maintain a healthy mental state    Continue Psychosocial Services  Follow up required by staff             Psychosocial Re-Evaluation:  Psychosocial Re-Evaluation     Row Name 06/27/23 [phone]             Psychosocial Re-Evaluation   Current issues with None Identified       Comments Wylee denies any stress, depression or anxiety at this time. She says she has been sleeping well.       Expected Outcomes STG: Continue to attend rehab and focus on good sleep. LTG: Achieve and maintain positive outlook on health and daily life       Interventions Encouraged to attend Cardiac Rehabilitation for the exercise       Continue Psychosocial Services  Follow up required by staff                Psychosocial  Discharge (Final Psychosocial Re-Evaluation):  Psychosocial Re-Evaluation - 06/27/23 0936       Psychosocial Re-Evaluation   Current issues with None Identified    Comments Amarii denies any stress, depression or anxiety at this time. She says she has been sleeping well.    Expected Outcomes STG: Continue to attend rehab and focus on good sleep. LTG: Achieve and maintain positive outlook on health and daily life    Interventions Encouraged to attend Cardiac Rehabilitation for the exercise    Continue Psychosocial Services  Follow up required by staff             Vocational Rehabilitation: Provide vocational rehab assistance to qualifying candidates.   Vocational Rehab Evaluation & Intervention:   Education: Education Goals: Education classes will be provided on a variety of topics geared toward better understanding of heart health and risk factor modification. Participant will state understanding/return demonstration of topics presented as noted by education test scores.  Learning  Barriers/Preferences:  Learning Barriers/Preferences - 05/10/23 1343       Learning Barriers/Preferences   Learning Barriers None    Learning Preferences None             General Cardiac Education Topics:  AED/CPR: - Group verbal and written instruction with the use of models to demonstrate the basic use of the AED with the basic ABC's of resuscitation.   Anatomy and Cardiac Procedures: - Group verbal and visual presentation and models provide information about basic cardiac anatomy and function. Reviews the testing methods done to diagnose heart disease and the outcomes of the test results. Describes the treatment choices: Medical Management, Angioplasty, or Coronary Bypass Surgery for treating various heart conditions including Myocardial Infarction, Angina, Valve Disease, and Cardiac Arrhythmias.  Written material given at graduation. Flowsheet Row Cardiac Rehab from 06/11/2020 in [facility] Cardiac and Pulmonary Rehab  Date 05/28/20  Educator [facility]  Instruction Review Code 1- Verbalizes Understanding       Medication Safety: - Group verbal and visual instruction to review commonly prescribed medications for heart and lung disease. Reviews the medication, class of the drug, and side effects. Includes the steps to properly store meds and maintain the prescription regimen.  Written material given at graduation. Flowsheet Row Cardiac Rehab from 06/11/2020 in [facility] Cardiac and Pulmonary Rehab  Date 04/16/20  Educator SB  Instruction Review Code 1- Verbalizes Understanding       Intimacy: - Group verbal instruction through game format to discuss how heart and lung disease can affect sexual intimacy. Written material given at graduation.. Flowsheet Row Cardiac Rehab from 06/29/2023 in [facility] Cardiac and Pulmonary Rehab  Date 06/29/23  Educator MB  Instruction Review Code 1- Verbalizes Understanding       Know Your Numbers and Heart Failure: - Group verbal and visual instruction to  discuss disease risk factors for cardiac and pulmonary disease and treatment options.  Reviews associated critical values for Overweight/Obesity, Hypertension, Cholesterol, and Diabetes.  Discusses basics of heart failure: signs/symptoms and treatments.  Introduces Heart Failure Zone chart for action plan for heart failure.  Written material given at graduation. Flowsheet Row Cardiac Rehab from 06/29/2023 in [facility] Cardiac and Pulmonary Rehab  Education need identified 05/22/23       Infection Prevention: - Provides verbal and written material to individual with discussion of infection control including proper hand washing and proper equipment cleaning during exercise session. Flowsheet Row Cardiac Rehab from 06/29/2023 in [facility] Cardiac and Pulmonary Rehab  Date 05/10/23  Educator jh  Instruction Review Code 1- Verbalizes Understanding       Falls Prevention: - Provides verbal and written material to individual with discussion of falls prevention and safety. Flowsheet Row Cardiac Rehab from 06/29/2023 in [facility] Cardiac and Pulmonary Rehab  Date 05/10/23  Educator jh  Instruction Review Code 1- Verbalizes Understanding       Other: -Provides group and verbal instruction on various topics (see comments)   Knowledge Questionnaire Score:  Knowledge Questionnaire Score - 05/22/23 1524       Knowledge Questionnaire Score   Pre Score 24/26             Core Components/Risk Factors/Patient Goals at Admission:  Personal Goals and Risk Factors at Admission - 05/10/23 1343       Core Components/Risk Factors/Patient Goals on Admission    Weight Management Yes;Weight Maintenance;Weight Loss    Intervention Weight Management: Develop a combined nutrition and exercise program designed to reach desired caloric intake, while maintaining appropriate intake of nutrient and fiber, sodium and fats, and appropriate energy expenditure required for the weight goal.;Weight Management: Provide  education and appropriate resources to help participant work on and attain dietary goals.;Weight Management/Obesity: Establish reasonable short term and long term weight goals.    Expected Outcomes Short Term: Continue to assess and modify interventions until short term weight is achieved;Long Term: Adherence to nutrition and physical activity/exercise program aimed toward attainment of established weight goal;Weight Loss: Understanding of general recommendations for a balanced deficit meal plan, which promotes 1-2 lb weight loss per week and includes a negative energy balance of [phone] kcal/d;Understanding recommendations for meals to include 15-35% energy as protein, 25-35% energy from fat, 35-60% energy from carbohydrates, less than 200mg  of dietary cholesterol, 20-35 gm of total fiber daily;Understanding of distribution of calorie intake throughout the day with the consumption of 4-5 meals/snacks;Weight Maintenance: Understanding of the daily nutrition guidelines, which includes 25-35% calories from fat, 7% or less cal from saturated fats, less than 200mg  cholesterol, less than 1.5gm of sodium, & 5 or more servings of fruits and vegetables daily    Hypertension Yes    Intervention Provide education on lifestyle modifcations including regular physical activity/exercise, weight management, moderate sodium restriction and increased consumption of fresh fruit, vegetables, and low fat dairy, alcohol moderation, and smoking cessation.;Monitor prescription use compliance.    Expected Outcomes Short Term: Continued assessment and intervention until BP is < 140/21mm HG in hypertensive participants. < 130/3mm HG in hypertensive participants with diabetes, heart failure or chronic kidney disease.;Long Term: Maintenance of blood pressure at goal levels.    Lipids Yes    Intervention Provide education and support for participant on nutrition & aerobic/resistive exercise along with prescribed medications to achieve  LDL 70mg , HDL >40mg .    Expected Outcomes Short Term: Participant states understanding of desired cholesterol values and is compliant with medications prescribed. Participant is following exercise prescription and nutrition guidelines.;Long Term: Cholesterol controlled with medications as prescribed, with individualized exercise RX and with personalized nutrition plan. Value goals: LDL < 70mg , HDL > 40 mg.             Education:Diabetes - Individual verbal and written instruction to review signs/symptoms of diabetes, desired ranges of glucose level fasting, after meals and with exercise. Acknowledge that pre and post exercise glucose checks will be done for 3 sessions at entry of program.   Core Components/Risk Factors/Patient Goals Review:   Goals and Risk Factor Review     Row Name 06/27/23 [phone]  Core Components/Risk Factors/Patient Goals Review   Personal Goals Review Hypertension       Review Capricia checks her blood pressure at home, reports it often matches with the readings done here at rehab. She is aware of sodiums effect on BP and monitors her intake.       Expected Outcomes STG: Continue to check BP at home. LTG: Manage risk fastors independently                Core Components/Risk Factors/Patient Goals at Discharge (Final Review):   Goals and Risk Factor Review - 06/27/23 0940       Core Components/Risk Factors/Patient Goals Review   Personal Goals Review Hypertension    Review Ayomide checks her blood pressure at home, reports it often matches with the readings done here at rehab. She is aware of sodiums effect on BP and monitors her intake.    Expected Outcomes STG: Continue to check BP at home. LTG: Manage risk fastors independently             ITP Comments:  ITP Comments     Row Name 05/10/23 1342 05/22/23 1520 05/30/23 0945 06/07/23 1338 07/05/23 1017   ITP Comments Virtual Visit completed. Patient informed on EP and RD appointment and 6  Minute walk test. Patient also informed of patient health questionnaires on My Chart. Patient Verbalizes understanding. Visit diagnosis can be found in [facility] 04/21/2023. Completed and gym orientation for cardiac rehab. Initial ITP created and sent for review to Dr. Firman Hughes, Medical Director. First full day of exercise!  Patient was oriented to gym and equipment including functions, settings, policies, and procedures.  Patient's individual exercise prescription and treatment plan were reviewed.  All starting workloads were established based on the results of the 6 minute walk test done at initial orientation visit.  The plan for exercise progression was also introduced and progression will be customized based on patient's performance and goals. 30 Day review completed. Medical Director ITP review done, changes made as directed, and signed approval by Medical Director.    new to program 30 Day review completed. Medical Director ITP review done, changes made as directed, and signed approval by Medical Director.            Comments:

## 2023-07-06 ENCOUNTER — Encounter: Admitting: *Deleted

## 2023-07-06 DIAGNOSIS — Z955 Presence of coronary angioplasty implant and graft: Secondary | ICD-10-CM

## 2023-07-06 DIAGNOSIS — Z48812 Encounter for surgical aftercare following surgery on the circulatory system: Secondary | ICD-10-CM | POA: Diagnosis not present

## 2023-07-06 NOTE — Progress Notes (Signed)
 Daily Session Note  Patient Details  Name: Amy Lin MRN: [id number] Date of Birth: 23-Sep-1933 Referring Provider:   Flowsheet Row Cardiac Rehab from 05/22/2023 in [facility] Cardiac and Pulmonary Rehab  Referring Provider Dr. Fransico Ivy, MD       Encounter Date: 07/06/2023  Check In:  Session Check In - 07/06/23 0951       Check-In   Supervising physician immediately available to respond to emergencies See telemetry face sheet for immediately available ER MD    Location [facility]-Cardiac & Pulmonary Rehab    Staff Present Maud Sorenson, RN, BSN, CCRP;Margaret Best, MS, Exercise Physiologist;Maxon Conetta BS, Exercise Physiologist;Joseph Lacinda Pica RCP,RRT,BSRT    Virtual Visit No    Medication changes reported     No    Fall or balance concerns reported    No    Warm-up and Cool-down Performed on first and last piece of equipment    Resistance Training Performed Yes    VAD Patient? No    PAD/SET Patient? No      Pain Assessment   Currently in Pain? No/denies                Social History   Tobacco Use  Smoking Status Never  Smokeless Tobacco Never    Goals Met:  Independence with exercise equipment Exercise tolerated well No report of concerns or symptoms today  Goals Unmet:  Not Applicable  Comments: Pt able to follow exercise prescription today without complaint.  Will continue to monitor for progression.    Dr. Firman Hughes is Medical Director for [facility] Cardiac Rehabilitation.  Dr. Fuad Aleskerov is Medical Director for [facility] Pulmonary Rehabilitation.

## 2023-07-11 ENCOUNTER — Encounter: Admitting: *Deleted

## 2023-07-11 DIAGNOSIS — Z48812 Encounter for surgical aftercare following surgery on the circulatory system: Secondary | ICD-10-CM | POA: Diagnosis not present

## 2023-07-11 DIAGNOSIS — Z955 Presence of coronary angioplasty implant and graft: Secondary | ICD-10-CM

## 2023-07-11 NOTE — Progress Notes (Signed)
 Daily Session Note  Patient Details  Name: Amy Lin MRN: [id number] Date of Birth: 05/05/1933 Referring Provider:   Flowsheet Row Cardiac Rehab from 05/22/2023 in [facility] Cardiac and Pulmonary Rehab  Referring Provider Dr. Fransico Ivy, MD       Encounter Date: 07/11/2023  Check In:  Session Check In - 07/11/23 0929       Check-In   Supervising physician immediately available to respond to emergencies See telemetry face sheet for immediately available ER MD    Location [facility]-Cardiac & Pulmonary Rehab    Staff Present Maud Sorenson, RN, BSN, CCRP;Noah Tickle, BS, Exercise Physiologist;Kelly Bollinger RN,BSN,MPA;Jason Martina Sledge RDN,LDN    Virtual Visit No    Medication changes reported     No    Fall or balance concerns reported    No    Warm-up and Cool-down Performed on first and last piece of equipment    Resistance Training Performed Yes    VAD Patient? No    PAD/SET Patient? No      Pain Assessment   Currently in Pain? No/denies                Social History   Tobacco Use  Smoking Status Never  Smokeless Tobacco Never    Goals Met:  Independence with exercise equipment Exercise tolerated well No report of concerns or symptoms today  Goals Unmet:  Not Applicable  Comments: Pt able to follow exercise prescription today without complaint.  Will continue to monitor for progression.    Dr. Firman Hughes is Medical Director for [facility] Cardiac Rehabilitation.  Dr. Fuad Aleskerov is Medical Director for [facility] Pulmonary Rehabilitation.

## 2023-07-13 ENCOUNTER — Encounter: Admitting: *Deleted

## 2023-07-13 ENCOUNTER — Telehealth: Payer: Self-pay | Admitting: Cardiology

## 2023-07-13 DIAGNOSIS — Z48812 Encounter for surgical aftercare following surgery on the circulatory system: Secondary | ICD-10-CM | POA: Diagnosis not present

## 2023-07-13 DIAGNOSIS — Z955 Presence of coronary angioplasty implant and graft: Secondary | ICD-10-CM

## 2023-07-13 MED ORDER — ROSUVASTATIN CALCIUM 20 MG PO TABS: 20.0000 mg | ORAL_TABLET | Freq: Every day | ORAL | 3 refills | Status: AC

## 2023-07-13 NOTE — Progress Notes (Signed)
 Daily Session Note  Patient Details  Name: Amy Lin MRN: [id number] Date of Birth: 09-Jun-1933 Referring Provider:   Flowsheet Row Cardiac Rehab from 05/22/2023 in [facility] Cardiac and Pulmonary Rehab  Referring Provider Dr. Fransico Ivy, MD    Encounter Date: 07/13/2023  Check In:  Session Check In - 07/13/23 0935       Check-In   Supervising physician immediately available to respond to emergencies See telemetry face sheet for immediately available ER MD    Location [facility]-Cardiac & Pulmonary Rehab    Staff Present Maud Sorenson, RN, BSN, CCRP;Meredith Manson Seitz RN,BSN;Noah Tickle, BS, Exercise Physiologist;Jason Martina Sledge RDN,LDN    Virtual Visit No    Medication changes reported     No    Fall or balance concerns reported    No    Warm-up and Cool-down Performed on first and last piece of equipment    Resistance Training Performed Yes    VAD Patient? No    PAD/SET Patient? No      Pain Assessment   Currently in Pain? No/denies             Social History   Tobacco Use  Smoking Status Never  Smokeless Tobacco Never    Goals Met:  Independence with exercise equipment Exercise tolerated well No report of concerns or symptoms today  Goals Unmet:  Not Applicable  Comments: Pt able to follow exercise prescription today without complaint.  Will continue to monitor for progression.    Dr. Firman Hughes is Medical Director for [facility] Cardiac Rehabilitation.  Dr. Fuad Aleskerov is Medical Director for [facility] Pulmonary Rehabilitation.

## 2023-07-13 NOTE — Telephone Encounter (Signed)
 Rx sent to requested Pharmacy.

## 2023-07-13 NOTE — Telephone Encounter (Signed)
*  STAT* If patient is at the pharmacy, call can be transferred to refill team.   1. Which medications need to be refilled? (please list name of each medication and dose if known) rosuvastatin  (CRESTOR ) 20 MG tablet    2. Would you like to learn more about the convenience, safety, & potential cost savings by using the [facility]?    3. Are you open to using the [facility] (Type [facility].  ).   4. Which pharmacy/location (including street and city if local pharmacy) is medication to be sent to?  [facility] - [place], [place] - [address]    5. Do they need a 30 day or 90 day supply? 90 day

## 2023-07-18 ENCOUNTER — Encounter

## 2023-07-19 ENCOUNTER — Encounter

## 2023-07-19 DIAGNOSIS — Z48812 Encounter for surgical aftercare following surgery on the circulatory system: Secondary | ICD-10-CM | POA: Diagnosis not present

## 2023-07-19 DIAGNOSIS — Z955 Presence of coronary angioplasty implant and graft: Secondary | ICD-10-CM

## 2023-07-19 NOTE — Progress Notes (Signed)
 Daily Session Note  Patient Details  Name: Amy Lin MRN: [id number] Date of Birth: Nov 30, 1933 Referring Provider:   Flowsheet Row Cardiac Rehab from 05/22/2023 in [facility] Cardiac and Pulmonary Rehab  Referring Provider Dr. Fransico Ivy, MD    Encounter Date: 07/19/2023  Check In:  Session Check In - 07/19/23 1126       Check-In   Supervising physician immediately available to respond to emergencies See telemetry face sheet for immediately available ER MD    Location [facility]-Cardiac & Pulmonary Rehab    Staff Present Maud Sorenson, RN, BSN, CCRP;Joseph Hood RCP,RRT,BSRT;Maxon Knippa BS, Exercise Physiologist;Jason Martina Sledge RDN,LDN    Virtual Visit No    Medication changes reported     No    Fall or balance concerns reported    No    Warm-up and Cool-down Performed on first and last piece of equipment    Resistance Training Performed Yes    VAD Patient? No    PAD/SET Patient? No      Pain Assessment   Currently in Pain? No/denies             Social History   Tobacco Use  Smoking Status Never  Smokeless Tobacco Never    Goals Met:  Independence with exercise equipment Exercise tolerated well No report of concerns or symptoms today  Goals Unmet:  Not Applicable  Comments: Pt able to follow exercise prescription today without complaint.  Will continue to monitor for progression.    Dr. Firman Hughes is Medical Director for [facility] Cardiac Rehabilitation.  Dr. Fuad Aleskerov is Medical Director for [facility] Pulmonary Rehabilitation.

## 2023-07-20 ENCOUNTER — Encounter: Admitting: *Deleted

## 2023-07-20 DIAGNOSIS — Z955 Presence of coronary angioplasty implant and graft: Secondary | ICD-10-CM

## 2023-07-20 DIAGNOSIS — Z48812 Encounter for surgical aftercare following surgery on the circulatory system: Secondary | ICD-10-CM | POA: Diagnosis not present

## 2023-07-20 NOTE — Progress Notes (Signed)
 Daily Session Note  Patient Details  Name: Amy Lin MRN: [id number] Date of Birth: [birth year] Referring Provider:   Flowsheet Row Cardiac Rehab from 05/22/2023 in [facility] Cardiac and Pulmonary Rehab  Referring Provider Dr. Fransico Ivy, MD    Encounter Date: 07/20/2023  Check In:  Session Check In - 07/20/23 0930       Check-In   Supervising physician immediately available to respond to emergencies See telemetry face sheet for immediately available ER MD    Location [facility]-Cardiac & Pulmonary Rehab    Staff Present Maud Sorenson, RN, BSN, CCRP;Joseph Hood RCP,RRT,BSRT;Maxon [place] BS, Exercise Physiologist;Krista Spencer RN,BSN    Virtual Visit No    Medication changes reported     No    Fall or balance concerns reported    No    Warm-up and Cool-down Performed on first and last piece of equipment    Resistance Training Performed Yes    VAD Patient? No    PAD/SET Patient? No      Pain Assessment   Currently in Pain? No/denies             Social History   Tobacco Use  Smoking Status Never  Smokeless Tobacco Never    Goals Met:  Independence with exercise equipment Exercise tolerated well No report of concerns or symptoms today  Goals Unmet:  Not Applicable  Comments: Pt able to follow exercise prescription today without complaint.  Will continue to monitor for progression.    Dr. Firman Hughes is Medical Director for [facility] Cardiac Rehabilitation.  Dr. Fuad Aleskerov is Medical Director for [facility] Pulmonary Rehabilitation.

## 2023-07-25 ENCOUNTER — Encounter: Admitting: *Deleted

## 2023-07-25 DIAGNOSIS — Z48812 Encounter for surgical aftercare following surgery on the circulatory system: Secondary | ICD-10-CM | POA: Diagnosis not present

## 2023-07-25 DIAGNOSIS — Z955 Presence of coronary angioplasty implant and graft: Secondary | ICD-10-CM

## 2023-07-25 NOTE — Progress Notes (Signed)
 Daily Session Note  Patient Details  Name: Amy Lin MRN: [id number] Date of Birth: 1933/08/25 Referring Provider:   Flowsheet Row Cardiac Rehab from 05/22/2023 in [facility] Cardiac and Pulmonary Rehab  Referring Provider Dr. Newman Lawrence, MD    Encounter Date: 07/25/2023  Check In:  Session Check In - 07/25/23 9077       Check-In   Supervising physician immediately available to respond to emergencies See telemetry face sheet for immediately available ER MD    Location [facility]-Cardiac & Pulmonary Rehab    Staff Present Othel Durand, RN, BSN, CCRP;Noah Tickle, BS, Exercise Physiologist;Maxon Conetta BS, Exercise Physiologist;Jason Elnor RDN,LDN    Virtual Visit No    Medication changes reported     No    Fall or balance concerns reported    No    Warm-up and Cool-down Performed on first and last piece of equipment    Resistance Training Performed Yes    VAD Patient? No    PAD/SET Patient? No      Pain Assessment   Currently in Pain? No/denies             Social History   Tobacco Use  Smoking Status Never  Smokeless Tobacco Never    Goals Met:  Independence with exercise equipment Exercise tolerated well No report of concerns or symptoms today  Goals Unmet:  Not Applicable  Comments: Pt able to follow exercise prescription today without complaint.  Will continue to monitor for progression.   Reviewed home exercise with pt today.  Pt plans to walk and use TM at home for exercise.  Reviewed THR, pulse, RPE, sign and symptoms, pulse oximetery and when to call 911 or MD.  Also discussed weather considerations and indoor options.  Pt voiced understanding.   Dr. Oneil Pinal is Medical Director for [facility] Cardiac Rehabilitation.  Dr. Fuad Aleskerov is Medical Director for [facility] Pulmonary Rehabilitation.

## 2023-07-27 ENCOUNTER — Encounter

## 2023-07-27 DIAGNOSIS — Z85828 Personal history of other malignant neoplasm of skin: Secondary | ICD-10-CM | POA: Diagnosis not present

## 2023-07-27 DIAGNOSIS — L821 Other seborrheic keratosis: Secondary | ICD-10-CM | POA: Diagnosis not present

## 2023-07-27 DIAGNOSIS — D225 Melanocytic nevi of trunk: Secondary | ICD-10-CM | POA: Diagnosis not present

## 2023-07-27 DIAGNOSIS — Z419 Encounter for procedure for purposes other than remedying health state, unspecified: Secondary | ICD-10-CM | POA: Diagnosis not present

## 2023-07-27 DIAGNOSIS — L814 Other melanin hyperpigmentation: Secondary | ICD-10-CM | POA: Diagnosis not present

## 2023-07-27 DIAGNOSIS — Z08 Encounter for follow-up examination after completed treatment for malignant neoplasm: Secondary | ICD-10-CM | POA: Diagnosis not present

## 2023-07-28 ENCOUNTER — Encounter

## 2023-07-28 DIAGNOSIS — Z48812 Encounter for surgical aftercare following surgery on the circulatory system: Secondary | ICD-10-CM | POA: Diagnosis not present

## 2023-07-28 DIAGNOSIS — Z955 Presence of coronary angioplasty implant and graft: Secondary | ICD-10-CM

## 2023-07-28 NOTE — Progress Notes (Signed)
 Daily Session Note  Patient Details  Name: Amy Lin MRN: [id number] Date of Birth: 04/23/1933 Referring Provider:   Flowsheet Row Cardiac Rehab from 05/22/2023 in [facility] Cardiac and Pulmonary Rehab  Referring Provider Dr. Newman Lawrence, MD    Encounter Date: 07/28/2023  Check In:  Session Check In - 07/28/23 0916       Check-In   Supervising physician immediately available to respond to emergencies See telemetry face sheet for immediately available ER MD    Location [facility]-Cardiac & Pulmonary Rehab    Staff Present Devaughn Jaeger, BS, Exercise Physiologist;Keyari Kleeman RN,BSN,MPA;Maxon Conetta BS, Exercise Physiologist;Joseph Rolinda RCP,RRT,BSRT    Virtual Visit No    Medication changes reported     No    Fall or balance concerns reported    No    Warm-up and Cool-down Performed on first and last piece of equipment    Resistance Training Performed Yes    VAD Patient? No    PAD/SET Patient? No      Pain Assessment   Currently in Pain? No/denies             Social History   Tobacco Use  Smoking Status Never  Smokeless Tobacco Never    Goals Met:  Independence with exercise equipment Exercise tolerated well No report of concerns or symptoms today Strength training completed today  Goals Unmet:  Not Applicable  Comments: Pt able to follow exercise prescription today without complaint.  Will continue to monitor for progression.    Dr. Oneil Pinal is Medical Director for [facility] Cardiac Rehabilitation.  Dr. Fuad Aleskerov is Medical Director for [facility] Pulmonary Rehabilitation.

## 2023-08-01 ENCOUNTER — Encounter: Attending: Cardiology

## 2023-08-01 DIAGNOSIS — Z955 Presence of coronary angioplasty implant and graft: Secondary | ICD-10-CM | POA: Diagnosis not present

## 2023-08-01 NOTE — Progress Notes (Signed)
 Daily Session Note  Patient Details  Name: Amy Lin MRN: [id number] Date of Birth: [birth year] Referring Provider:   Flowsheet Row Cardiac Rehab from 05/22/2023 in [facility] Cardiac and Pulmonary Rehab  Referring Provider Dr. Newman Lawrence, MD    Encounter Date: 08/01/2023  Check In:  Session Check In - 08/01/23 0913       Check-In   Supervising physician immediately available to respond to emergencies See telemetry face sheet for immediately available ER MD    Location [facility]-Cardiac & Pulmonary Rehab    Staff Present Burnard Davenport RN,BSN,MPA;Maxon Conetta BS, Exercise Physiologist;Margaret Best, MS, Exercise Physiologist;Jason Elnor RDN,LDN    Virtual Visit No    Medication changes reported     No    Fall or balance concerns reported    No    Warm-up and Cool-down Performed on first and last piece of equipment    Resistance Training Performed Yes    VAD Patient? No    PAD/SET Patient? No      Pain Assessment   Currently in Pain? No/denies             Social History   Tobacco Use  Smoking Status Never  Smokeless Tobacco Never    Goals Met:  Independence with exercise equipment Exercise tolerated well No report of concerns or symptoms today Strength training completed today  Goals Unmet:  Not Applicable  Comments: Pt able to follow exercise prescription today without complaint.  Will continue to monitor for progression.    Dr. Oneil Pinal is Medical Director for [facility] Cardiac Rehabilitation.  Dr. Fuad Aleskerov is Medical Director for [facility] Pulmonary Rehabilitation.

## 2023-08-02 ENCOUNTER — Encounter: Payer: Self-pay | Admitting: *Deleted

## 2023-08-02 DIAGNOSIS — Z955 Presence of coronary angioplasty implant and graft: Secondary | ICD-10-CM

## 2023-08-02 NOTE — Progress Notes (Signed)
 Cardiac Individual Treatment Plan  Patient Details  Name: Amy Lin MRN: [id number] Date of Birth: 06-24-1933 Referring Provider:   Flowsheet Row Cardiac Rehab from 05/22/2023 in [facility] Cardiac and Pulmonary Rehab  Referring Provider Dr. Newman Lawrence, MD    Initial Encounter Date:  Flowsheet Row Cardiac Rehab from 05/22/2023 in [facility] Cardiac and Pulmonary Rehab  Date 05/22/23    Visit Diagnosis: Status post coronary artery stent placement  Patient's Home Medications on Admission:  Current Outpatient Medications:    aspirin  81 MG EC tablet, Take 81 mg by mouth daily before breakfast., Disp: , Rfl:    Cholecalciferol (VITAMIN D3) 2000 UNITS TABS, Take 2,000 Units by mouth every evening., Disp: , Rfl:    clopidogrel  (PLAVIX ) 75 MG tablet, Take 1 tablet (75 mg total) by mouth daily with breakfast., Disp: 90 tablet, Rfl: 3   denosumab  (PROLIA ) 60 MG/ML SOSY injection, Inject 60 mg into the skin every 6 (six) months. (Patient not taking: Reported on 06/01/2023), Disp: , Rfl:    ezetimibe  (ZETIA ) 10 MG tablet, Take 1 tablet (10 mg total) by mouth daily. (Patient taking differently: Take 10 mg by mouth at bedtime.), Disp: 90 tablet, Rfl: 2   fluticasone  (FLONASE ) 50 MCG/ACT nasal spray, PLACE ONE SPRAY INTO BOTH NOSTRILS DAILY (Patient taking differently: Place 1 spray into both nostrils daily as needed for allergies.), Disp: 16 mL, Rfl: 2   furosemide  (LASIX ) 20 MG tablet, Take 1 tablet (20 mg total) by mouth as needed for fluid., Disp: 90 tablet, Rfl: 3   hydrocortisone  2.5 % lotion, Apply 1 Application topically 2 (two) times daily as needed (bug bites/skin irritation.)., Disp: 59 mL, Rfl: 1   levocetirizine (XYZAL) 5 MG tablet, Take 5 mg by mouth daily as needed for allergies., Disp: , Rfl:    levothyroxine  (SYNTHROID ) 75 MCG tablet, TAKE ONE TABLET BY MOUTH ONCE A DAY. TAKE ON AN EMPTY STOMACH WITH A GLASS OF WATER ATLEAST 30-60 MIN BEFORE BREAKFAST, Disp: 90 tablet, Rfl: 1   lisinopril   (ZESTRIL ) 5 MG tablet, TAKE ONE TABLET BY MOUTH ONCE A DAY (Patient taking differently: Take 5 mg by mouth at bedtime.), Disp: 90 tablet, Rfl: PRN   Multiple Vitamins-Minerals (PRESERVISION AREDS 2 PO), Take 1 capsule by mouth in the morning and at bedtime., Disp: , Rfl:    Naphazoline-Pheniramine (OPCON-A) 0.027-0.315 % SOLN, Place 1-2 drops into both eyes 3 (three) times daily as needed (irritated/red eyes)., Disp: , Rfl:    nitroGLYCERIN  (NITROSTAT ) 0.4 MG SL tablet, Place 1 tablet (0.4 mg total) under the tongue every 5 (five) minutes as needed for chest pain., Disp: 30 tablet, Rfl: 3   ondansetron  (ZOFRAN -ODT) 4 MG disintegrating tablet, Take 1 tablet (4 mg total) by mouth every 8 (eight) hours as needed for nausea or vomiting., Disp: 10 tablet, Rfl: 0   pantoprazole  (PROTONIX ) 20 MG tablet, TAKE ONE TABLET BY MOUTH TWICE A DAY, Disp: 180 tablet, Rfl: 2   potassium chloride  (KLOR-CON  M) 10 MEQ tablet, Take 1 tablet (10 mEq total) by mouth daily as needed (with use of lasix )., Disp: 30 tablet, Rfl: 3   potassium chloride  (KLOR-CON ) 10 MEQ tablet, Take 10 mEq by mouth daily., Disp: , Rfl:    rosuvastatin  (CRESTOR ) 20 MG tablet, Take 1 tablet (20 mg total) by mouth daily., Disp: 90 tablet, Rfl: 3  Past Medical History: Past Medical History:  Diagnosis Date   Allergy    Cataract Removed   Coronary artery disease    GERD (gastroesophageal  reflux disease)    Hyperlipidemia    Hypertension    diuretic for edema at first   Hypothyroidism    Myocardial infarction [facility])    Neuromuscular disorder (HCC) Don't know   Osteoarthritis, multiple sites    Osteoporosis    Squamous cell carcinoma of skin 12/08/2022   Right pretibial. WD SCC. Mohs 01/31/23    Tobacco Use: Social History   Tobacco Use  Smoking Status Never  Smokeless Tobacco Never    Labs: Review Flowsheet  More data exists      Latest Ref Rng & Units 05/31/2022 07/14/2022 07/15/2022 04/17/2023 06/01/2023  Labs for ITP Cardiac and  Pulmonary Rehab  Cholestrol 0 - 200 mg/dL 776  - - 868  877   LDL (calc) 0 - 99 mg/dL 850  - 82     59  42   HDL-C >39.00 mg/dL 53.39  - 54     52  47.99   Trlycerides 0.0 - 149.0 mg/dL 861.9  - 862     886  862.9   Hemoglobin A1c 4.6 - 6.5 % - 6.6  C    - - 6.7     Details      C Corrected result   This result is from an external source.          Exercise Target Goals: Exercise Program Goal: Individual exercise prescription set using results from initial 6 min walk test and THRR while considering  patient's activity barriers and safety.   Exercise Prescription Goal: Initial exercise prescription builds to 30-45 minutes a day of aerobic activity, 2-3 days per week.  Home exercise guidelines will be given to patient during program as part of exercise prescription that the participant will acknowledge.   Education: Aerobic Exercise: - Group verbal and visual presentation on the components of exercise prescription. Introduces F.I.T.T principle from ACSM for exercise prescriptions.  Reviews F.I.T.T. principles of aerobic exercise including progression. Written material given at graduation. Flowsheet Row Cardiac Rehab from 07/20/2023 in [facility] Cardiac and Pulmonary Rehab  Date 06/29/23  Educator MB  Instruction Review Code 1- Verbalizes Understanding    Education: Resistance Exercise: - Group verbal and visual presentation on the components of exercise prescription. Introduces F.I.T.T principle from ACSM for exercise prescriptions  Reviews F.I.T.T. principles of resistance exercise including progression. Written material given at graduation. Flowsheet Row Cardiac Rehab from 07/20/2023 in [facility] Cardiac and Pulmonary Rehab  Date 06/22/23  Educator MB  Instruction Review Code 1- [employer] Understanding     Education: Exercise & Equipment Safety: - Individual verbal instruction and demonstration of equipment use and safety with use of the equipment. Flowsheet Row Cardiac Rehab from  07/20/2023 in [facility] Cardiac and Pulmonary Rehab  Date 05/10/23  Educator jh  Instruction Review Code 1- Verbalizes Understanding    Education: Exercise Physiology & General Exercise Guidelines: - Group verbal and written instruction with models to review the exercise physiology of the cardiovascular system and associated critical values. Provides general exercise guidelines with specific guidelines to those with heart or lung disease.  Flowsheet Row Cardiac Rehab from 07/20/2023 in [facility] Cardiac and Pulmonary Rehab  Date 06/15/23  Educator MB  Instruction Review Code 1- [employer] Understanding    Education: Flexibility, Balance, Mind/Body Relaxation: - Group verbal and visual presentation with interactive activity on the components of exercise prescription. Introduces F.I.T.T principle from ACSM for exercise prescriptions. Reviews F.I.T.T. principles of flexibility and balance exercise training including progression. Also discusses the mind body connection.  Reviews  various relaxation techniques to help reduce and manage stress (i.e. Deep breathing, progressive muscle relaxation, and visualization). Balance handout provided to take home. Written material given at graduation. Flowsheet Row Cardiac Rehab from 07/20/2023 in [facility] Cardiac and Pulmonary Rehab  Date 06/22/23  Educator MB  Instruction Review Code 1- Verbalizes Understanding    Activity Barriers & Risk Stratification:  Activity Barriers & Cardiac Risk Stratification - 05/22/23 1527       Activity Barriers & Cardiac Risk Stratification   Activity Barriers Arthritis;Joint Problems;Muscular Weakness    Cardiac Risk Stratification Moderate          6 Minute Walk:  6 Minute Walk     Row Name 05/22/23 1530         6 Minute Walk   Phase Initial     Distance 1210 feet     Walk Time 6 minutes     # of Rest Breaks 0     MPH 2.29     METS 2.15     RPE 12     Perceived Dyspnea  1     VO2 Peak 7.53     Symptoms No      Resting HR 60 bpm     Resting BP 116/58     Resting Oxygen Saturation  97 %     Exercise Oxygen Saturation  during 6 min walk 97 %     Max Ex. HR 101 bpm     Max Ex. BP 152/68     2 Minute Post BP 148/66        Oxygen Initial Assessment:   Oxygen Re-Evaluation:   Oxygen Discharge (Final Oxygen Re-Evaluation):   Initial Exercise Prescription:  Initial Exercise Prescription - 05/22/23 1500       Date of Initial Exercise RX and Referring Provider   Date 05/22/23    Referring Provider Dr. Newman Lawrence, MD      Oxygen   Maintain Oxygen Saturation 88% or higher      Treadmill   MPH 1.8    Grade 0    Minutes 15    METs 2.38      Recumbant Bike   Level 1    RPM 50    Watts 15    Minutes 15    METs 2.15      NuStep   Level 2    SPM 80    Minutes 15    METs 2.15      Biostep-RELP   Level 1    SPM 50    Minutes 15    METs 2.15      Prescription Details   Frequency (times per week) 2    Duration Progress to 30 minutes of continuous aerobic without signs/symptoms of physical distress      Intensity   THRR 40-80% of Max Heartrate 89-118    Ratings of Perceived Exertion 11-13    Perceived Dyspnea 0-4      Progression   Progression Continue to progress workloads to maintain intensity without signs/symptoms of physical distress.      Resistance Training   Training Prescription Yes    Weight 2 lb    Reps 10-15          Perform Capillary Blood Glucose checks as needed.  Exercise Prescription Changes:   Exercise Prescription Changes     Row Name 05/22/23 1500 06/12/23 1600 06/29/23 1700 07/14/23 0700 07/25/23 0900     Response to Exercise   Blood Pressure (Admit)  116/58 122/60 110/60 118/60 --   Blood Pressure (Exercise) 152/68 146/64 142/60 144/74 --   Blood Pressure (Exit) 148/66 126/62 120/60 134/76 --   Heart Rate (Admit) 60 bpm 67 bpm 66 bpm 60 bpm --   Heart Rate (Exercise) 101 bpm 106 bpm 108 bpm 110 bpm --   Heart Rate (Exit) 67 bpm  75 bpm 74 bpm 68 bpm --   Oxygen Saturation (Admit) 97 % -- -- -- --   Oxygen Saturation (Exercise) 97 % -- -- -- --   Rating of Perceived Exertion (Exercise) 12 13 13 13  --   Perceived Dyspnea (Exercise) 1 -- -- -- --   Symptoms none none none none --   Comments Results First two weeks of rehab -- -- --   Duration -- Continue with 30 min of aerobic exercise without signs/symptoms of physical distress. Continue with 30 min of aerobic exercise without signs/symptoms of physical distress. Continue with 30 min of aerobic exercise without signs/symptoms of physical distress. Continue with 30 min of aerobic exercise without signs/symptoms of physical distress.   Intensity -- THRR unchanged THRR unchanged THRR unchanged THRR unchanged     Progression   Progression -- Continue to progress workloads to maintain intensity without signs/symptoms of physical distress. Continue to progress workloads to maintain intensity without signs/symptoms of physical distress. Continue to progress workloads to maintain intensity without signs/symptoms of physical distress. Continue to progress workloads to maintain intensity without signs/symptoms of physical distress.   Average METs -- 2.44 2.74 2.85 2.85     Resistance Training   Training Prescription -- Yes Yes Yes Yes   Weight -- 2 lb 2 lb 2 lb 2 lb   Reps -- 10-15 10-15 10-15 10-15     Interval Training   Interval Training -- No No No No     Treadmill   MPH -- 2 2.5 2.4 2.4   Grade -- 0 0 0 0   Minutes -- 15 15 15 15    METs -- 2.53 2.91 2.84 2.84     Recumbant Bike   Level -- 2 -- -- --   Watts -- 19 -- -- --   Minutes -- 15 -- -- --     NuStep   Level -- 2 1 1 1    Minutes -- 15 15 15 15    METs -- 3 2.8 2.8 2.8     Biostep-RELP   Level -- 1 2 2 2    Minutes -- 15 15 15 15    METs -- 2 -- 3 3     Home Exercise Plan   Plans to continue exercise at -- -- -- -- Home (comment)  walking and TM   Frequency -- -- -- -- Add 2 additional days to  program exercise sessions.   Initial Home Exercises Provided -- -- -- -- 07/25/23     Oxygen   Maintain Oxygen Saturation -- 88% or higher 88% or higher 88% or higher 88% or higher    Row Name 07/26/23 1400             Response to Exercise   Blood Pressure (Admit) 136/68       Blood Pressure (Exercise) 126/68       Blood Pressure (Exit) 136/70       Heart Rate (Admit) 61 bpm       Heart Rate (Exercise) 98 bpm       Heart Rate (Exit) 68 bpm       Oxygen  Saturation (Admit) 95 %       Oxygen Saturation (Exercise) 95 %       Oxygen Saturation (Exit) 96 %       Rating of Perceived Exertion (Exercise) 13       Symptoms none       Duration Continue with 30 min of aerobic exercise without signs/symptoms of physical distress.       Intensity THRR unchanged         Progression   Progression Continue to progress workloads to maintain intensity without signs/symptoms of physical distress.       Average METs 2.83         Resistance Training   Training Prescription Yes       Weight 2 lb       Reps 10-15         Interval Training   Interval Training No         Treadmill   MPH 2.5       Grade 0       Minutes 15       METs 2.91         NuStep   Level 2       Minutes 15       METs 2.7         T5 Nustep   Level 2       SPM 80       Minutes 15       METs 2.2         Biostep-RELP   Level 2       Minutes 15       METs 3.22         Home Exercise Plan   Plans to continue exercise at Home (comment)  walking and TM       Frequency Add 2 additional days to program exercise sessions.       Initial Home Exercises Provided 07/25/23         Oxygen   Maintain Oxygen Saturation 88% or higher          Exercise Comments:   Exercise Comments     Row Name 05/30/23 0945           Exercise Comments First full day of exercise!  Patient was oriented to gym and equipment including functions, settings, policies, and procedures.  Patient's individual exercise prescription and  treatment plan were reviewed.  All starting workloads were established based on the results of the 6 minute walk test done at initial orientation visit.  The plan for exercise progression was also introduced and progression will be customized based on patient's performance and goals.          Exercise Goals and Review:   Exercise Goals     Row Name 05/22/23 1526             Exercise Goals   Increase Physical Activity Yes       Intervention Provide advice, education, support and counseling about physical activity/exercise needs.;Develop an individualized exercise prescription for aerobic and resistive training based on initial evaluation findings, risk stratification, comorbidities and participant's personal goals.       Expected Outcomes Short Term: Attend rehab on a regular basis to increase amount of physical activity.;Long Term: Add in home exercise to make exercise part of routine and to increase amount of physical activity.;Long Term: Exercising regularly at least 3-5 days a week.       Increase Strength  and Stamina Yes       Intervention Provide advice, education, support and counseling about physical activity/exercise needs.;Develop an individualized exercise prescription for aerobic and resistive training based on initial evaluation findings, risk stratification, comorbidities and participant's personal goals.       Expected Outcomes Short Term: Increase workloads from initial exercise prescription for resistance, speed, and METs.;Short Term: Perform resistance training exercises routinely during rehab and add in resistance training at home;Long Term: Improve cardiorespiratory fitness, muscular endurance and strength as measured by increased METs and functional capacity ( )       Able to understand and use rate of perceived exertion (RPE) scale Yes       Intervention Provide education and explanation on how to use RPE scale       Expected Outcomes Short Term: Able to use RPE daily  in rehab to express subjective intensity level;Long Term:  Able to use RPE to guide intensity level when exercising independently       Able to understand and use Dyspnea scale Yes       Intervention Provide education and explanation on how to use Dyspnea scale       Expected Outcomes Short Term: Able to use Dyspnea scale daily in rehab to express subjective sense of shortness of breath during exertion;Long Term: Able to use Dyspnea scale to guide intensity level when exercising independently       Knowledge and understanding of Target Heart Rate Range (THRR) Yes       Intervention Provide education and explanation of THRR including how the numbers were predicted and where they are located for reference       Expected Outcomes Short Term: Able to state/look up THRR;Short Term: Able to use daily as guideline for intensity in rehab;Long Term: Able to use THRR to govern intensity when exercising independently       Able to check pulse independently Yes       Intervention Provide education and demonstration on how to check pulse in carotid and radial arteries.;Review the importance of being able to check your own pulse for safety during independent exercise       Expected Outcomes Short Term: Able to explain why pulse checking is important during independent exercise;Long Term: Able to check pulse independently and accurately       Understanding of Exercise Prescription Yes       Intervention Provide education, explanation, and written materials on patient's individual exercise prescription       Expected Outcomes Short Term: Able to explain program exercise prescription;Long Term: Able to explain home exercise prescription to exercise independently          Exercise Goals Re-Evaluation :  Exercise Goals Re-Evaluation     Row Name 05/30/23 0945 06/12/23 1612 06/27/23 0935 06/29/23 1731 07/14/23 0744     Exercise Goal Re-Evaluation   Exercise Goals Review Understanding of Exercise  Prescription;Knowledge and understanding of Target Heart Rate Range (THRR);Able to understand and use Dyspnea scale;Able to understand and use rate of perceived exertion (RPE) scale Increase Physical Activity;Increase Strength and Stamina;Understanding of Exercise Prescription Increase Physical Activity;Increase Strength and Stamina;Understanding of Exercise Prescription Increase Physical Activity;Increase Strength and Stamina;Understanding of Exercise Prescription Increase Physical Activity;Increase Strength and Stamina;Understanding of Exercise Prescription   Comments Reviewed RPE and dyspnea scale, THR and program prescription with pt today. Pt voiced understanding and was given a copy of goals to take home. Amy Lin is off to a good start in rehab. She did well on the  treadmill at a speed of 2 mph with no incline. She also did well at level 2 on the recumbent bike and T4 nustep and level 1 on the biostep. We will continue to monitor her progress in the program. Amy Lin is doing well with exercise at rehab. She as handweights and a treadmill at home. Says she used to use them consistently but fell out of routine. Set goal to continue to attend rehab and look to add more home exercise. Amy Lin is doing well in rehab. She increased her speed of the treadmill to 2.5 mph with no incline. She also increased to level 2 on the biostep. She decreased back to level 1 on the T4 nustep, but increased her METs to 2.8. We will continue to monitor her progress in the program. Amy Lin continues to do well in rehab. She continues to walk on the treadmill at 2.4 mph with no incline. She also continues to work at level 2 on the biostep and continues to work at a decreased level of 1 on the T4 nustep. We will continue to monitor her progress in the program.   Expected Outcomes Short: Use RPE daily to regulate intensity.  Long: Follow program prescription in THR. Short: Continue to follow current exercise prescription. Long: Continue  exercise to improve strength and stamina. STG: continue to attend rehab and look to add more home exercise. LTG: Continue exercise to improve strength and stamina. Short: Try to get back to level 2 on the T4 nustep. Long: Continue exercise to improve strength and stamina. Short: Increase back up to level 2 on the T4 nustep. Long: Continue exercise to improve strength and stamina.    Row Name 07/25/23 9071 07/26/23 1414           Exercise Goal Re-Evaluation   Exercise Goals Review Increase Physical Activity;Able to understand and use rate of perceived exertion (RPE) scale;Knowledge and understanding of Target Heart Rate Range (THRR);Understanding of Exercise Prescription;Increase Strength and Stamina;Able to understand and use Dyspnea scale;Able to check pulse independently Increase Physical Activity;Understanding of Exercise Prescription;Increase Strength and Stamina      Comments Reviewed home exercise with pt today.  Pt plans to walk and use TM at home for exercise.  Reviewed THR, pulse, RPE, sign and symptoms, pulse oximetery and when to call 911 or MD.  Also discussed weather considerations and indoor options.  Pt voiced understanding. Amy Lin continues to do well in rehab. She was able to increase her workload on the treadmill to a speed of 2.5 mph and no incline. She also increased to level 2 on the T4 nustep. She maintained level 2 on the biostep. We will continue to monitor her progress in the program.      Expected Outcomes Short: add 1-2 days a week of exercise at home on off days of rehab. Long: maintain independent exercise routine upon graduation from cardiac rehab. Short: Continue to progressively increase treadmill and nustep workloads. Long: Continue exercise to improve strength and stamina.         Discharge Exercise Prescription (Final Exercise Prescription Changes):  Exercise Prescription Changes - 07/26/23 1400       Response to Exercise   Blood Pressure (Admit) 136/68    Blood  Pressure (Exercise) 126/68    Blood Pressure (Exit) 136/70    Heart Rate (Admit) 61 bpm    Heart Rate (Exercise) 98 bpm    Heart Rate (Exit) 68 bpm    Oxygen Saturation (Admit) 95 %    Oxygen  Saturation (Exercise) 95 %    Oxygen Saturation (Exit) 96 %    Rating of Perceived Exertion (Exercise) 13    Symptoms none    Duration Continue with 30 min of aerobic exercise without signs/symptoms of physical distress.    Intensity THRR unchanged      Progression   Progression Continue to progress workloads to maintain intensity without signs/symptoms of physical distress.    Average METs 2.83      Resistance Training   Training Prescription Yes    Weight 2 lb    Reps 10-15      Interval Training   Interval Training No      Treadmill   MPH 2.5    Grade 0    Minutes 15    METs 2.91      NuStep   Level 2    Minutes 15    METs 2.7      T5 Nustep   Level 2    SPM 80    Minutes 15    METs 2.2      Biostep-RELP   Level 2    Minutes 15    METs 3.22      Home Exercise Plan   Plans to continue exercise at Home (comment)   walking and TM   Frequency Add 2 additional days to program exercise sessions.    Initial Home Exercises Provided 07/25/23      Oxygen   Maintain Oxygen Saturation 88% or higher          Nutrition:  Target Goals: Understanding of nutrition guidelines, daily intake of sodium 1500mg , cholesterol 200mg , calories 30% from fat and 7% or less from saturated fats, daily to have 5 or more servings of fruits and vegetables.  Education: All About Nutrition: -Group instruction provided by verbal, written material, interactive activities, discussions, models, and posters to present general guidelines for heart healthy nutrition including fat, fiber, MyPlate, the role of sodium in heart healthy nutrition, utilization of the nutrition label, and utilization of this knowledge for meal planning. Follow up email sent as well. Written material given at  graduation. Flowsheet Row Cardiac Rehab from 07/20/2023 in [facility] Cardiac and Pulmonary Rehab  Date 07/13/23  Educator JG part 2  Instruction Review Code 1- Verbalizes Understanding    Biometrics:  Pre Biometrics - 05/22/23 1527       Pre Biometrics   Height 5' 5 (1.651 m)    Weight 142 lb 3.2 oz (64.5 kg)    Waist Circumference 34.5 inches    Hip Circumference 38.5 inches    Waist to Hip Ratio 0.9 %    BMI (Calculated) 23.66    Single Leg Stand 25.2 seconds           Nutrition Therapy Plan and Nutrition Goals:  Nutrition Therapy & Goals - 05/22/23 1428       Nutrition Therapy   Diet Cardiac, Low Na    Protein (specify units) 65    Fiber 25 grams    Whole Grain Foods 3 servings    Saturated Fats 15 max. grams    Fruits and Vegetables 5 servings/day    Sodium 2 grams      Personal Nutrition Goals   Nutrition Goal Eat 15-30gProtein and 30-60gCarbs at each meal.    Personal Goal #2 Drink 48oz of water daily    Personal Goal #3 Include more veggies and/or fruit    Comments Patient drinking ~32oz of water daily. She feels she could  do better, set goal to aim for ~48oz. She eats 3 times per day, he meals are often balanced with protein and carbs portioned well. She likes veggies and eats them daily, though she would like to include more in her diet. Brainstormed some meals ideas to help her include more colorful produce. She does like sweets and eats them after her dinner often. Says its a habit, and will work on improving. She doesn't salt her foods often, but does likes some salty snacks. Encouraged her to look for lower sodium options and try to do less salt. Overall, she is doing very well, eating enough to support her body's needs. Will continue to monitor and support.      Intervention Plan   Intervention Prescribe, educate and counsel regarding individualized specific dietary modifications aiming towards targeted core components such as weight, hypertension, lipid  management, diabetes, heart failure and other comorbidities.;Nutrition handout(s) given to patient.    Expected Outcomes Short Term Goal: Understand basic principles of dietary content, such as calories, fat, sodium, cholesterol and nutrients.;Short Term Goal: A plan has been developed with personal nutrition goals set during dietitian appointment.;Long Term Goal: Adherence to prescribed nutrition plan.          Nutrition Assessments:  MEDIFICTS Score Key: >=70 Need to make dietary changes  40-70 Heart Healthy Diet <= 40 Therapeutic Level Cholesterol Diet  Flowsheet Row Cardiac Rehab from 05/22/2023 in [facility] Cardiac and Pulmonary Rehab  Picture Your Plate Total Score on Admission 57   Picture Your Plate Scores: <59 Unhealthy dietary pattern with much room for improvement. 41-50 Dietary pattern unlikely to meet recommendations for good health and room for improvement. 51-60 More healthful dietary pattern, with some room for improvement.  >60 Healthy dietary pattern, although there may be some specific behaviors that could be improved.    Nutrition Goals Re-Evaluation:  Nutrition Goals Re-Evaluation     Row Name 06/27/23 [phone] 07/11/23 0940           Goals   Comment Spoke with Rock about making sure she eats a protein at most of her meals. She likes fruit and includes several servings per day. She is monitoring her salt intake. Encouraged her to continue to read labels and build balanced meals Amy Lin states that she is continuing to work on her diet. She is trying to add more vegetables to her diet and has been eliminating excess sodium. She states that she is drinking lots of water while also drinking coffee every morning and the occasional tea.      Expected Outcome STG: continue to read labels and build balanced meals. LTG: Follow a heart healthy lifestyle Short: Continue to add more vegetables to diet. Long: Continue to practice heart healthy eating patterns discussed with RD.          Nutrition Goals Discharge (Final Nutrition Goals Re-Evaluation):  Nutrition Goals Re-Evaluation - 07/11/23 0940       Goals   Comment Amy Lin states that she is continuing to work on her diet. She is trying to add more vegetables to her diet and has been eliminating excess sodium. She states that she is drinking lots of water while also drinking coffee every morning and the occasional tea.    Expected Outcome Short: Continue to add more vegetables to diet. Long: Continue to practice heart healthy eating patterns discussed with RD.          Psychosocial: Target Goals: Acknowledge presence or absence of significant depression and/or stress, maximize coping  skills, provide positive support system. Participant is able to verbalize types and ability to use techniques and skills needed for reducing stress and depression.   Education: Stress, Anxiety, and Depression - Group verbal and visual presentation to define topics covered.  Reviews how body is impacted by stress, anxiety, and depression.  Also discusses healthy ways to reduce stress and to treat/manage anxiety and depression.  Written material given at graduation. Flowsheet Row Cardiac Rehab from 07/20/2023 in [facility] Cardiac and Pulmonary Rehab  Education need identified 05/22/23  Date 06/08/23  Educator [facility]  Instruction Review Code 1- [employer] Understanding    Education: Sleep Hygiene -Provides group verbal and written instruction about how sleep can affect your health.  Define sleep hygiene, discuss sleep cycles and impact of sleep habits. Review good sleep hygiene tips.    Initial Review & Psychosocial Screening:  Initial Psych Review & Screening - 05/10/23 1344       Initial Review   Current issues with None Identified      Family Dynamics   Good Support System? Yes    Comments Lace can look to her son, daughter, friends and church for support. She takes no medications for her mood and has done Cardiac rehab before and  knows what to expect.      Barriers   Psychosocial barriers to participate in program There are no identifiable barriers or psychosocial needs.;The patient should benefit from training in stress management and relaxation.      Screening Interventions   Interventions Encouraged to exercise;Provide feedback about the scores to participant;To provide support and resources with identified psychosocial needs    Expected Outcomes Long Term Goal: Stressors or current issues are controlled or eliminated.;Short Term goal: Utilizing psychosocial counselor, staff and physician to assist with identification of specific Stressors or current issues interfering with healing process. Setting desired goal for each stressor or current issue identified.;Short Term goal: Identification and review with participant of any Quality of Life or Depression concerns found by scoring the questionnaire.;Long Term goal: The participant improves quality of Life and PHQ9 Scores as seen by post scores and/or verbalization of changes          Quality of Life Scores:   Quality of Life - 05/22/23 1524       Quality of Life   Select Quality of Life      Quality of Life Scores   Health/Function Pre 22.1 %    Socioeconomic Pre 25 %    Psych/Spiritual Pre 26.21 %    Family Pre 25.8 %    GLOBAL Pre 24.11 %         Scores of 19 and below usually indicate a poorer quality of life in these areas.  A difference of  2-3 points is a clinically meaningful difference.  A difference of 2-3 points in the total score of the Quality of Life Index has been associated with significant improvement in overall quality of life, self-image, physical symptoms, and general health in studies assessing change in quality of life.  PHQ-9: Review Flowsheet  More data exists      06/01/2023 05/22/2023 03/20/2023 12/26/2022 05/31/2022  Depression screen PHQ 2/9  Decreased Interest 0 0 0 0 0  Down, Depressed, Hopeless 0 0 0 0 0  PHQ - 2 Score 0 0 0 0  0  Altered sleeping 1 1 - - 0  Tired, decreased energy 1 2 - - 2  Change in appetite 0 0 - - 1  Feeling bad  or failure about yourself  0 0 - - 1  Trouble concentrating 0 0 - - 0  Moving slowly or fidgety/restless 0 0 - - 0  Suicidal thoughts 0 0 - - 0  PHQ-9 Score 2 3 - - 4  Difficult doing work/chores Not difficult at all Somewhat difficult - - Somewhat difficult   Interpretation of Total Score  Total Score Depression Severity:  1-4 = Minimal depression, 5-9 = Mild depression, 10-14 = Moderate depression, 15-19 = Moderately severe depression, 20-27 = Severe depression   Psychosocial Evaluation and Intervention:  Psychosocial Evaluation - 05/10/23 1346       Psychosocial Evaluation & Interventions   Interventions Encouraged to exercise with the program and follow exercise prescription;Relaxation education;Stress management education    Comments Amy Lin can look to her son, daughter, friends and church for support. She takes no medications for her mood and has done Cardiac rehab before and knows what to expect.    Expected Outcomes Short: Start [facility] to help with mood. Long: Maintain a healthy mental state    Continue Psychosocial Services  Follow up required by staff          Psychosocial Re-Evaluation:  Psychosocial Re-Evaluation     Row Name 06/27/23 [phone] 07/11/23 0943           Psychosocial Re-Evaluation   Current issues with None Identified None Identified      Comments Amy Lin denies any stress, depression or anxiety at this time. She says she has been sleeping well. Amy Lin denies any major stressors at this time. She states that the news and current events can make her upset, but she enjoys going to eat with friends and watching the birds in her flowers. She states that she is sleeping well although she reports not going to sleep later than she should. She also enjoys being active in her church and helping with the concesssions at local recreational events.      Expected  Outcomes STG: Continue to attend rehab and focus on good sleep. LTG: Achieve and maintain positive outlook on health and daily life Short: Continue to relieve stress through healthy avenues. Long: Maintain positive outlook.      Interventions Encouraged to attend Cardiac Rehabilitation for the exercise Encouraged to attend Cardiac Rehabilitation for the exercise      Continue Psychosocial Services  Follow up required by staff Follow up required by staff         Psychosocial Discharge (Final Psychosocial Re-Evaluation):  Psychosocial Re-Evaluation - 07/11/23 0943       Psychosocial Re-Evaluation   Current issues with None Identified    Comments Amy Lin denies any major stressors at this time. She states that the news and current events can make her upset, but she enjoys going to eat with friends and watching the birds in her flowers. She states that she is sleeping well although she reports not going to sleep later than she should. She also enjoys being active in her church and helping with the concesssions at local recreational events.    Expected Outcomes Short: Continue to relieve stress through healthy avenues. Long: Maintain positive outlook.    Interventions Encouraged to attend Cardiac Rehabilitation for the exercise    Continue Psychosocial Services  Follow up required by staff          Vocational Rehabilitation: Provide vocational rehab assistance to qualifying candidates.   Vocational Rehab Evaluation & Intervention:   Education: Education Goals: Education classes will be provided on a variety  of topics geared toward better understanding of heart health and risk factor modification. Participant will state understanding/return demonstration of topics presented as noted by education test scores.  Learning Barriers/Preferences:  Learning Barriers/Preferences - 05/10/23 1343       Learning Barriers/Preferences   Learning Barriers None    Learning Preferences None           General Cardiac Education Topics:  AED/CPR: - Group verbal and written instruction with the use of models to demonstrate the basic use of the AED with the basic ABC's of resuscitation.   Anatomy and Cardiac Procedures: - Group verbal and visual presentation and models provide information about basic cardiac anatomy and function. Reviews the testing methods done to diagnose heart disease and the outcomes of the test results. Describes the treatment choices: Medical Management, Angioplasty, or Coronary Bypass Surgery for treating various heart conditions including Myocardial Infarction, Angina, Valve Disease, and Cardiac Arrhythmias.  Written material given at graduation. Flowsheet Row Cardiac Rehab from 07/20/2023 in [facility] Cardiac and Pulmonary Rehab  Date 07/20/23  Educator [facility]  Instruction Review Code 1- Verbalizes Understanding    Medication Safety: - Group verbal and visual instruction to review commonly prescribed medications for heart and lung disease. Reviews the medication, class of the drug, and side effects. Includes the steps to properly store meds and maintain the prescription regimen.  Written material given at graduation. Flowsheet Row Cardiac Rehab from 06/11/2020 in [facility] Cardiac and Pulmonary Rehab  Date 04/16/20  Educator SB  Instruction Review Code 1- Verbalizes Understanding    Intimacy: - Group verbal instruction through game format to discuss how heart and lung disease can affect sexual intimacy. Written material given at graduation.. Flowsheet Row Cardiac Rehab from 07/20/2023 in [facility] Cardiac and Pulmonary Rehab  Date 06/29/23  Educator MB  Instruction Review Code 1- Verbalizes Understanding    Know Your Numbers and Heart Failure: - Group verbal and visual instruction to discuss disease risk factors for cardiac and pulmonary disease and treatment options.  Reviews associated critical values for Overweight/Obesity, Hypertension, Cholesterol, and Diabetes.   Discusses basics of heart failure: signs/symptoms and treatments.  Introduces Heart Failure Zone chart for action plan for heart failure.  Written material given at graduation. Flowsheet Row Cardiac Rehab from 07/20/2023 in [facility] Cardiac and Pulmonary Rehab  Education need identified 05/22/23    Infection Prevention: - Provides verbal and written material to individual with discussion of infection control including proper hand washing and proper equipment cleaning during exercise session. Flowsheet Row Cardiac Rehab from 07/20/2023 in [facility] Cardiac and Pulmonary Rehab  Date 05/10/23  Educator jh  Instruction Review Code 1- Verbalizes Understanding    Falls Prevention: - Provides verbal and written material to individual with discussion of falls prevention and safety. Flowsheet Row Cardiac Rehab from 07/20/2023 in [facility] Cardiac and Pulmonary Rehab  Date 05/10/23  Educator jh  Instruction Review Code 1- Verbalizes Understanding    Other: -Provides group and verbal instruction on various topics (see comments)   Knowledge Questionnaire Score:  Knowledge Questionnaire Score - 05/22/23 1524       Knowledge Questionnaire Score   Pre Score 24/26          Core Components/Risk Factors/Patient Goals at Admission:  Personal Goals and Risk Factors at Admission - 05/10/23 1343       Core Components/Risk Factors/Patient Goals on Admission    Weight Management Yes;Weight Maintenance;Weight Loss    Intervention Weight Management: Develop a combined nutrition and exercise program designed to  reach desired caloric intake, while maintaining appropriate intake of nutrient and fiber, sodium and fats, and appropriate energy expenditure required for the weight goal.;Weight Management: Provide education and appropriate resources to help participant work on and attain dietary goals.;Weight Management/Obesity: Establish reasonable short term and long term weight goals.    Expected Outcomes Short Term:  Continue to assess and modify interventions until short term weight is achieved;Long Term: Adherence to nutrition and physical activity/exercise program aimed toward attainment of established weight goal;Weight Loss: Understanding of general recommendations for a balanced deficit meal plan, which promotes 1-2 lb weight loss per week and includes a negative energy balance of [phone] kcal/d;Understanding recommendations for meals to include 15-35% energy as protein, 25-35% energy from fat, 35-60% energy from carbohydrates, less than 200mg  of dietary cholesterol, 20-35 gm of total fiber daily;Understanding of distribution of calorie intake throughout the day with the consumption of 4-5 meals/snacks;Weight Maintenance: Understanding of the daily nutrition guidelines, which includes 25-35% calories from fat, 7% or less cal from saturated fats, less than 200mg  cholesterol, less than 1.5gm of sodium, & 5 or more servings of fruits and vegetables daily    Hypertension Yes    Intervention Provide education on lifestyle modifcations including regular physical activity/exercise, weight management, moderate sodium restriction and increased consumption of fresh fruit, vegetables, and low fat dairy, alcohol moderation, and smoking cessation.;Monitor prescription use compliance.    Expected Outcomes Short Term: Continued assessment and intervention until BP is < 140/45mm HG in hypertensive participants. < 130/60mm HG in hypertensive participants with diabetes, heart failure or chronic kidney disease.;Long Term: Maintenance of blood pressure at goal levels.    Lipids Yes    Intervention Provide education and support for participant on nutrition & aerobic/resistive exercise along with prescribed medications to achieve LDL 70mg , HDL >40mg .    Expected Outcomes Short Term: Participant states understanding of desired cholesterol values and is compliant with medications prescribed. Participant is following exercise prescription  and nutrition guidelines.;Long Term: Cholesterol controlled with medications as prescribed, with individualized exercise RX and with personalized nutrition plan. Value goals: LDL < 70mg , HDL > 40 mg.          Education:Diabetes - Individual verbal and written instruction to review signs/symptoms of diabetes, desired ranges of glucose level fasting, after meals and with exercise. Acknowledge that pre and post exercise glucose checks will be done for 3 sessions at entry of program.   Core Components/Risk Factors/Patient Goals Review:   Goals and Risk Factor Review     Row Name 06/27/23 0940 07/11/23 0947           Core Components/Risk Factors/Patient Goals Review   Personal Goals Review Hypertension Hypertension;Weight Management/Obesity      Review Rock checks her blood pressure at home, reports it often matches with the readings done here at rehab. She is aware of sodiums effect on BP and monitors her intake. Amy Lin states that she has lost weight but she believes it is mostly muscle mass. She would like to gain some muslce mass back without gaining any unhealthy weight. She has also been monitoring her BP at home multpile times each week. She states that her BP has stayed within normal ranges most of the time. She knows to check her BP if she is feeling bad because it does get low at times. She continues to take all her medications as prescribed.      Expected Outcomes STG: Continue to check BP at home. LTG: Manage risk fastors independently Short: Continue to  regularly check BP at home. Long: Continue to manage lifestyle risk factors.         Core Components/Risk Factors/Patient Goals at Discharge (Final Review):   Goals and Risk Factor Review - 07/11/23 0947       Core Components/Risk Factors/Patient Goals Review   Personal Goals Review Hypertension;Weight Management/Obesity    Review Amy Lin states that she has lost weight but she believes it is mostly muscle mass. She would like to  gain some muslce mass back without gaining any unhealthy weight. She has also been monitoring her BP at home multpile times each week. She states that her BP has stayed within normal ranges most of the time. She knows to check her BP if she is feeling bad because it does get low at times. She continues to take all her medications as prescribed.    Expected Outcomes Short: Continue to regularly check BP at home. Long: Continue to manage lifestyle risk factors.          ITP Comments:  ITP Comments     Row Name 05/10/23 1342 05/22/23 1520 05/30/23 0945 06/07/23 1338 07/05/23 1017   ITP Comments Virtual Visit completed. Patient informed on EP and RD appointment and 6 Minute walk test. Patient also informed of patient health questionnaires on My Chart. Patient Verbalizes understanding. Visit diagnosis can be found in [facility] 04/21/2023. Completed and gym orientation for cardiac rehab. Initial ITP created and sent for review to Dr. Oneil Pinal, Medical Director. First full day of exercise!  Patient was oriented to gym and equipment including functions, settings, policies, and procedures.  Patient's individual exercise prescription and treatment plan were reviewed.  All starting workloads were established based on the results of the 6 minute walk test done at initial orientation visit.  The plan for exercise progression was also introduced and progression will be customized based on patient's performance and goals. 30 Day review completed. Medical Director ITP review done, changes made as directed, and signed approval by Medical Director.    new to program 30 Day review completed. Medical Director ITP review done, changes made as directed, and signed approval by Medical Director.    Row Name 08/02/23 1213           ITP Comments 30 Day review completed. Medical Director ITP review done, changes made as directed, and signed approval by Medical Director.          Comments: 30 day review

## 2023-08-03 ENCOUNTER — Encounter: Admitting: *Deleted

## 2023-08-03 DIAGNOSIS — Z955 Presence of coronary angioplasty implant and graft: Secondary | ICD-10-CM

## 2023-08-03 NOTE — Progress Notes (Signed)
 Daily Session Note  Patient Details  Name: Amy Lin MRN: [id number] Date of Birth: [birth year] Referring Provider:   Flowsheet Row Cardiac Rehab from 05/22/2023 in [facility] Cardiac and Pulmonary Rehab  Referring Provider Dr. Newman Lawrence, MD    Encounter Date: 08/03/2023  Check In:  Session Check In - 08/03/23 0937       Check-In   Supervising physician immediately available to respond to emergencies See telemetry face sheet for immediately available ER MD    Location [facility]-Cardiac & Pulmonary Rehab    Staff Present Maxon Conetta BS, Exercise Physiologist;Jason Elnor RDN,LDN;[employer];Othel Durand, RN, BSN, CCRP    Virtual Visit No    Medication changes reported     No    Fall or balance concerns reported    No    Warm-up and Cool-down Performed on first and last piece of equipment    Resistance Training Performed Yes    VAD Patient? No    PAD/SET Patient? No      Pain Assessment   Currently in Pain? No/denies             Social History   Tobacco Use  Smoking Status Never  Smokeless Tobacco Never    Goals Met:  Independence with exercise equipment Exercise tolerated well No report of concerns or symptoms today  Goals Unmet:  Not Applicable  Comments: Pt able to follow exercise prescription today without complaint.  Will continue to monitor for progression.    Dr. Oneil Pinal is Medical Director for [facility] Cardiac Rehabilitation.  Dr. Fuad Aleskerov is Medical Director for [facility] Pulmonary Rehabilitation.

## 2023-08-08 ENCOUNTER — Encounter

## 2023-08-08 DIAGNOSIS — Z955 Presence of coronary angioplasty implant and graft: Secondary | ICD-10-CM

## 2023-08-08 NOTE — Progress Notes (Signed)
 Daily Session Note  Patient Details  Name: AAYAT HAJJAR MRN: [id number] Date of Birth: 06/20/1933 Referring Provider:   Flowsheet Row Cardiac Rehab from 05/22/2023 in [facility] Cardiac and Pulmonary Rehab  Referring Provider Dr. Newman Lawrence, MD    Encounter Date: 08/08/2023  Check In:  Session Check In - 08/08/23 0910       Check-In   Supervising physician immediately available to respond to emergencies See telemetry face sheet for immediately available ER MD    Location [facility]-Cardiac & Pulmonary Rehab    Staff Present Burnard Davenport RN,BSN,MPA;Jason Elnor RDN,LDN;Margaret Best, MS, Exercise Physiologist;Karielle Davidow Dyane HECKLE, ACSM CEP, Exercise Physiologist    Virtual Visit No    Medication changes reported     No    Fall or balance concerns reported    No    Warm-up and Cool-down Performed on first and last piece of equipment    Resistance Training Performed Yes    VAD Patient? No    PAD/SET Patient? No      Pain Assessment   Currently in Pain? No/denies             Social History   Tobacco Use  Smoking Status Never  Smokeless Tobacco Never    Goals Met:  Independence with exercise equipment Exercise tolerated well No report of concerns or symptoms today Strength training completed today  Goals Unmet:  Not Applicable  Comments: Pt able to follow exercise prescription today without complaint.  Will continue to monitor for progression.    Dr. Oneil Pinal is Medical Director for [facility] Cardiac Rehabilitation.  Dr. Fuad Aleskerov is Medical Director for [facility] Pulmonary Rehabilitation.

## 2023-08-10 ENCOUNTER — Encounter: Admitting: *Deleted

## 2023-08-10 DIAGNOSIS — Z955 Presence of coronary angioplasty implant and graft: Secondary | ICD-10-CM

## 2023-08-10 NOTE — Progress Notes (Signed)
 Daily Session Note  Patient Details  Name: Amy Lin MRN: [id number] Date of Birth: 01-30-1934 Referring Provider:   Flowsheet Row Cardiac Rehab from 05/22/2023 in [facility] Cardiac and Pulmonary Rehab  Referring Provider Dr. Newman Lawrence, MD    Encounter Date: 08/10/2023  Check In:  Session Check In - 08/10/23 0936       Check-In   Supervising physician immediately available to respond to emergencies See telemetry face sheet for immediately available ER MD    Location [facility]-Cardiac & Pulmonary Rehab    Staff Present Othel Durand, RN, BSN, CCRP;Laureen Delores, BS, RRT, CPFT;Meredith Tressa RN,BSN;Jason Elnor RDN,LDN;Joseph Rolinda RCP,RRT,BSRT    Virtual Visit No    Medication changes reported     No    Fall or balance concerns reported    No    Warm-up and Cool-down Performed on first and last piece of equipment    Resistance Training Performed Yes    VAD Patient? No    PAD/SET Patient? No      Pain Assessment   Currently in Pain? No/denies             Social History   Tobacco Use  Smoking Status Never  Smokeless Tobacco Never    Goals Met:  Independence with exercise equipment Exercise tolerated well No report of concerns or symptoms today  Goals Unmet:  Not Applicable  Comments: Pt able to follow exercise prescription today without complaint.  Will continue to monitor for progression.    Dr. Oneil Pinal is Medical Director for [facility] Cardiac Rehabilitation.  Dr. Fuad Aleskerov is Medical Director for [facility] Pulmonary Rehabilitation.

## 2023-08-14 ENCOUNTER — Telehealth: Payer: Self-pay | Admitting: Cardiology

## 2023-08-14 NOTE — Telephone Encounter (Signed)
Called pt. Left message to call back.

## 2023-08-14 NOTE — Telephone Encounter (Signed)
 Pt returning call, requesting cb

## 2023-08-14 NOTE — Telephone Encounter (Signed)
 STAT if HR is under 50 or over 120 (normal HR is 60-100 beats per minute)  What is your heart rate?   40  Do you have a log of your heart rate readings (document readings)?   Do you have any other symptoms?   Dizziness and weakness    Caller (Dr. Lynwood Ryder) stated patient is in their study program and wanted to let Dr. Elmira know patient presented with a HR reading of 40 today (around 10:45am).  Caller wants a call to follow-up with patient directly.

## 2023-08-14 NOTE — Telephone Encounter (Signed)
 Contacted pt after Dr. Johnie contacted office. Pt reports that heart rate was 40 at visit. Pt has a hx of bradycardia it looks like.   Symptoms: weakness, fatigue, lightheadedness.   Took Lasix  yesterday around 1300 and take Lisinopril  5 mg daily. Last blood pressure pt took was 108/52, HR was 63 bpm. Heart rate at home after visit was 54 bpm. Pt states she has been staying hydrated.  Please advise further step.

## 2023-08-14 NOTE — Telephone Encounter (Signed)
 Scheduled appt for 08/25/23. Pt admits that she has been having the lightlessness since last visit, but has been progressing. Pt declines chest pain. Advised pt to be evaluated if symptoms worsen.

## 2023-08-15 ENCOUNTER — Emergency Department
Admission: EM | Admit: 2023-08-15 | Discharge: 2023-08-15 | Disposition: A | Discharge status: Home / Self Care | Attending: Emergency Medicine | Admitting: Emergency Medicine

## 2023-08-15 ENCOUNTER — Encounter

## 2023-08-15 ENCOUNTER — Emergency Department

## 2023-08-15 ENCOUNTER — Telehealth: Payer: Self-pay | Admitting: Cardiology

## 2023-08-15 VITALS — Ht 65.0 in | Wt 142.1 lb

## 2023-08-15 DIAGNOSIS — Z955 Presence of coronary angioplasty implant and graft: Secondary | ICD-10-CM | POA: Insufficient documentation

## 2023-08-15 DIAGNOSIS — I700 Atherosclerosis of aorta: Secondary | ICD-10-CM | POA: Diagnosis not present

## 2023-08-15 DIAGNOSIS — I10 Essential (primary) hypertension: Secondary | ICD-10-CM | POA: Insufficient documentation

## 2023-08-15 DIAGNOSIS — R42 Dizziness and giddiness: Secondary | ICD-10-CM | POA: Diagnosis not present

## 2023-08-15 DIAGNOSIS — E785 Hyperlipidemia, unspecified: Secondary | ICD-10-CM | POA: Diagnosis not present

## 2023-08-15 DIAGNOSIS — I252 Old myocardial infarction: Secondary | ICD-10-CM | POA: Diagnosis not present

## 2023-08-15 DIAGNOSIS — E039 Hypothyroidism, unspecified: Secondary | ICD-10-CM | POA: Insufficient documentation

## 2023-08-15 DIAGNOSIS — I2510 Atherosclerotic heart disease of native coronary artery without angina pectoris: Secondary | ICD-10-CM | POA: Insufficient documentation

## 2023-08-15 DIAGNOSIS — R55 Syncope and collapse: Secondary | ICD-10-CM | POA: Diagnosis not present

## 2023-08-15 DIAGNOSIS — R001 Bradycardia, unspecified: Secondary | ICD-10-CM | POA: Insufficient documentation

## 2023-08-15 LAB — CBC
HCT: 36.9 % (ref 36.0–46.0)
Hemoglobin: 12 g/dL (ref 12.0–15.0)
MCH: 31 pg (ref 26.0–34.0)
MCHC: 32.5 g/dL (ref 30.0–36.0)
MCV: 95.3 fL (ref 80.0–100.0)
Platelets: 141 K/uL — ABNORMAL LOW (ref 150–400)
RBC: 3.87 MIL/uL (ref 3.87–5.11)
RDW: 13 % (ref 11.5–15.5)
WBC: 6.3 K/uL (ref 4.0–10.5)
nRBC: 0 % (ref 0.0–0.2)

## 2023-08-15 LAB — COMPREHENSIVE METABOLIC PANEL WITH GFR
ALT: 13 U/L (ref 0–44)
AST: 22 U/L (ref 15–41)
Albumin: 3.7 g/dL (ref 3.5–5.0)
Alkaline Phosphatase: 58 U/L (ref 38–126)
Anion gap: 12 (ref 5–15)
BUN: 19 mg/dL (ref 8–23)
CO2: 21 mmol/L — ABNORMAL LOW (ref 22–32)
Calcium: 9.6 mg/dL (ref 8.9–10.3)
Chloride: 106 mmol/L (ref 98–111)
Creatinine, Ser: 0.77 mg/dL (ref 0.44–1.00)
GFR, Estimated: >60 mL/min (ref >60)
Glucose, Bld: 98 mg/dL (ref 70–99)
Potassium: 4.3 mmol/L (ref 3.5–5.1)
Sodium: 139 mmol/L (ref 135–145)
Total Bilirubin: 0.7 mg/dL (ref 0.0–1.2)
Total Protein: 7.6 g/dL (ref 6.5–8.1)

## 2023-08-15 LAB — TROPONIN I (HIGH SENSITIVITY): Troponin I (High Sensitivity): 8 ng/L (ref <18)

## 2023-08-15 LAB — BRAIN NATRIURETIC PEPTIDE: B Natriuretic Peptide: 156.4 pg/mL — ABNORMAL HIGH (ref 0.0–100.0)

## 2023-08-15 NOTE — Telephone Encounter (Signed)
 STAT if patient feels like he/she is going to faint   1. Are you feeling dizzy, lightheaded, or faint right now? Yes, some. Patient was finishing exercise with cardiac rehab when this episode occurred.   2. Have you passed out?  no (If yes move to .[facility])   3. Do you have any other symptoms? Weakness and shaky,   4. Have you checked your HR and BP (record if available)? Yes, 144/72 HR was in 50's 60's

## 2023-08-15 NOTE — Telephone Encounter (Signed)
 Left message to call back

## 2023-08-15 NOTE — ED Notes (Signed)
 Pt walked to the restroom and back w/o difficulty.  Pt denied dizziness.

## 2023-08-15 NOTE — ED Notes (Signed)
 ED Provider at bedside.

## 2023-08-15 NOTE — Telephone Encounter (Signed)
 On no medications that could reduce heart rate. She is on lisinopril  5 mg daily, which is a very low dose of blood pressure medication. I would encourage increasing fluid intake to see if symptoms improve. I will see her on 7/25 as scheduled.  Thanks MJP

## 2023-08-15 NOTE — Telephone Encounter (Signed)
 Called patient left message on personal voice mail to call back.

## 2023-08-15 NOTE — ED Notes (Signed)
Lab made aware of add ons

## 2023-08-15 NOTE — ED Provider Notes (Signed)
 [facility] Provider Note   Event Date/Time   First MD Initiated Contact with Patient 08/15/23 1458     (approximate) History  Dizziness  HPI Amy Lin is a 89 y.o. female with stated past medical history of hyperlipidemia, hypothyroidism, hypertension, and coronary artery disease with stent placement 3 months prior to arrival who presents from cardiac rehab after feeling extremely lightheaded while doing her exercises today.  Patient states that she has had episodes of lightheadedness in the morning over the past few days and has found her heart rate to be as low as 40.  Patient also endorses hypertension after it was checked during this episode of lightheadedness the day.  Patient denies any exertional worsening.  Patient denies any orthostatic worsening. ROS: Patient currently denies any vision changes, tinnitus, difficulty speaking, facial droop, sore throat, chest pain, shortness of breath, abdominal pain, nausea/vomiting/diarrhea, dysuria, or weakness/numbness/paresthesias in any extremity   Physical Exam  Triage Vital Signs: ED Triage Vitals  Encounter Vitals Group     BP 08/15/23 1157 (!) 165/87     Girls Systolic BP Percentile --      Girls Diastolic BP Percentile --      Boys Systolic BP Percentile --      Boys Diastolic BP Percentile --      Pulse Rate 08/15/23 1157 (!) 120     Resp 08/15/23 1157 20     Temp 08/15/23 1157 98 F (36.7 C)     Temp Source 08/15/23 1157 Oral     SpO2 08/15/23 1157 95 %     Weight --      Height --      Head Circumference --      Peak Flow --      Pain Score 08/15/23 1158 0     Pain Loc --      Pain Education --      Exclude from Growth Chart --    Most recent vital signs: Vitals:   08/15/23 1700 08/15/23 1730  BP: (!) 170/72 (!) 167/69  Pulse: (!) 55 (!) 58  Resp: 13 17  Temp:  97.7 F (36.5 C)  SpO2: 98% 99%   General: Awake, oriented x4. CV:  Good peripheral perfusion. Resp:  Normal  effort. Abd:  No distention. Other:  Elderly well-developed, well-nourished Caucasian female resting comfortably in no acute distress ED Results / Procedures / Treatments  Labs (all labs ordered are listed, but only abnormal results are displayed) Labs Reviewed  COMPREHENSIVE METABOLIC PANEL WITH GFR - Abnormal; Notable for the following components:      Result Value   CO2 21 (*)    All other components within normal limits  CBC - Abnormal; Notable for the following components:   Platelets 141 (*)    All other components within normal limits  BRAIN NATRIURETIC PEPTIDE - Abnormal; Notable for the following components:   B Natriuretic Peptide 156.4 (*)    All other components within normal limits  TROPONIN I (HIGH SENSITIVITY)   EKG ED ECG REPORT I, Artist MARLA Kerns, the attending physician, personally viewed and interpreted this ECG. Date: 08/15/2023 EKG Time: 1207 Rate: 53 Rhythm: Bradycardic sinus rhythm QRS Axis: normal Intervals: normal ST/T Wave abnormalities: normal Narrative Interpretation: Bradycardic sinus rhythm.  No evidence of acute ischemia RADIOLOGY ED MD interpretation: One-view portable chest x-ray interpreted by me shows no evidence of acute abnormalities including no pneumonia, pneumothorax, or widened mediastinum - All radiology independently interpreted and agree  with radiology assessment Official radiology report(s): DG Chest Port 1 View Result Date: 08/15/2023 CLINICAL DATA:  Presyncope, s/p MI 3 months ago EXAM: PORTABLE CHEST - 1 VIEW COMPARISON:  08/28/2020 FINDINGS: Lungs are clear. Heart size upper limits normal. Aortic Atherosclerosis (ICD10-170.0). No effusion. Left shoulder DJD. IMPRESSION: No acute findings. Electronically Signed   By: JONETTA Faes M.D.   On: 08/15/2023 15:46   PROCEDURES: Critical Care performed: No Procedures MEDICATIONS ORDERED IN ED: Medications - No data to display IMPRESSION / MDM / ASSESSMENT AND PLAN / ED COURSE  I reviewed  the triage vital signs and the nursing notes.                             The patient is on the cardiac monitor to evaluate for evidence of arrhythmia and/or significant heart rate changes. Patient's presentation is most consistent with acute presentation with potential threat to life or bodily function. This patient presents with generalized weakness and fatigue likely secondary to dehydration. Suspect acute kidney injury of prerenal origin. Doubt intrinsic renal dysfunction or obstructive nephropathy. Considered alternate etiologies of the patient's symptoms including infectious processes, severe metabolic derangements or electrolyte abnormalities, ischemia/ACS, heart failure, and intracranial/central processes but think these are unlikely given the history and physical exam.  Plan: labs, 1L fluid resuscitation, pain/nausea control, reassessment Consults: Cardiology, I spoke to Dr. Raford who recommends discharge home and follow-up after a heart monitor Dispo: Discharge home with cardiology follow-up   FINAL CLINICAL IMPRESSION(S) / ED DIAGNOSES   Final diagnoses:  Lightheadedness  Bradycardia   Rx / DC Orders   ED Discharge Orders          Ordered    Ambulatory referral to Cardiology       Comments: If you have not heard from the Cardiology office within the next 72 hours please call [phone].   08/15/23 1753           Note:  This document was prepared using Dragon voice recognition software and may include unintentional dictation errors.   Nehal Shives K, MD 08/15/23 [phone]

## 2023-08-15 NOTE — Patient Instructions (Signed)
 Discharge Patient Instructions  Patient Details  Name: Amy Lin MRN: [id number] Date of Birth: 26-Sep-1933 Referring Provider:  Wendee Lynwood HERO, NP   Number of Visits: 36  Reason for Discharge:  Patient reached a stable level of exercise. Patient independent in their exercise. Patient has met program and personal goals.  Diagnosis:  Status post coronary artery stent placement  Initial Exercise Prescription:  Initial Exercise Prescription - 05/22/23 1500       Date of Initial Exercise RX and Referring Provider   Date 05/22/23    Referring Provider Dr. Newman Lawrence, MD      Oxygen   Maintain Oxygen Saturation 88% or higher      Treadmill   MPH 1.8    Grade 0    Minutes 15    METs 2.38      Recumbant Bike   Level 1    RPM 50    Watts 15    Minutes 15    METs 2.15      NuStep   Level 2    SPM 80    Minutes 15    METs 2.15      Biostep-RELP   Level 1    SPM 50    Minutes 15    METs 2.15      Prescription Details   Frequency (times per week) 2    Duration Progress to 30 minutes of continuous aerobic without signs/symptoms of physical distress      Intensity   THRR 40-80% of Max Heartrate 89-118    Ratings of Perceived Exertion 11-13    Perceived Dyspnea 0-4      Progression   Progression Continue to progress workloads to maintain intensity without signs/symptoms of physical distress.      Resistance Training   Training Prescription Yes    Weight 2 lb    Reps 10-15          Discharge Exercise Prescription (Final Exercise Prescription Changes):  Exercise Prescription Changes - 08/09/23 1100       Response to Exercise   Blood Pressure (Admit) 158/64    Blood Pressure (Exit) 112/62    Heart Rate (Admit) 67 bpm    Heart Rate (Exercise) 102 bpm    Heart Rate (Exit) 78 bpm    Oxygen Saturation (Admit) 96 %    Oxygen Saturation (Exercise) 87 %    Oxygen Saturation (Exit) 96 %    Rating of Perceived Exertion (Exercise) 13    Symptoms  none    Duration Continue with 30 min of aerobic exercise without signs/symptoms of physical distress.    Intensity THRR unchanged      Progression   Progression Continue to progress workloads to maintain intensity without signs/symptoms of physical distress.    Average METs 2.75      Resistance Training   Training Prescription Yes    Weight 2 lb    Reps 10-15      Interval Training   Interval Training No      Treadmill   MPH 2.5    Grade 0    Minutes 15    METs 2.91      Recumbant Bike   Level 3    Watts 25    Minutes 15    METs 3.22      NuStep   Level 3    Minutes 15    METs 3.1      Biostep-RELP   Level 1    Minutes  15    METs 2      Home Exercise Plan   Plans to continue exercise at Home (comment)   walking and TM   Frequency Add 2 additional days to program exercise sessions.    Initial Home Exercises Provided 07/25/23      Oxygen   Maintain Oxygen Saturation 88% or higher          Functional Capacity:  6 Minute Walk     Row Name 05/22/23 1530 08/15/23 0949       6 Minute Walk   Phase Initial Discharge    Distance 1210 feet 1385 feet    Distance % Change -- 14.46 %    Distance Feet Change -- 175 ft    Walk Time 6 minutes 6 minutes    # of Rest Breaks 0 0    MPH 2.29 2.62    METS 2.15 2.53    RPE 12 12    Perceived Dyspnea  1 1    VO2 Peak 7.53 8.85    Symptoms No No    Resting HR 60 bpm 67 bpm    Resting BP 116/58 134/62    Resting Oxygen Saturation  97 % 95 %    Exercise Oxygen Saturation  during 6 min walk 97 % 97 %    Max Ex. HR 101 bpm 97 bpm    Max Ex. BP 152/68 168/64    2 Minute Post BP 148/66 136/60       Nutrition & Weight - Outcomes:  Pre Biometrics - 05/22/23 1527       Pre Biometrics   Height 5' 5 (1.651 m)    Weight 142 lb 3.2 oz (64.5 kg)    Waist Circumference 34.5 inches    Hip Circumference 38.5 inches    Waist to Hip Ratio 0.9 %    BMI (Calculated) 23.66    Single Leg Stand 25.2 seconds           Post Biometrics - 08/15/23 0950        Post  Biometrics   Height 5' 5 (1.651 m)    Weight 142 lb 1.6 oz (64.5 kg)    Waist Circumference 37 inches    Hip Circumference 39 inches    Waist to Hip Ratio 0.95 %    BMI (Calculated) 23.65    Single Leg Stand 23 seconds          Nutrition:  Nutrition Therapy & Goals - 05/22/23 1428       Nutrition Therapy   Diet Cardiac, Low Na    Protein (specify units) 65    Fiber 25 grams    Whole Grain Foods 3 servings    Saturated Fats 15 max. grams    Fruits and Vegetables 5 servings/day    Sodium 2 grams      Personal Nutrition Goals   Nutrition Goal Eat 15-30gProtein and 30-60gCarbs at each meal.    Personal Goal #2 Drink 48oz of water daily    Personal Goal #3 Include more veggies and/or fruit    Comments Patient drinking ~32oz of water daily. She feels she could do better, set goal to aim for ~48oz. She eats 3 times per day, he meals are often balanced with protein and carbs portioned well. She likes veggies and eats them daily, though she would like to include more in her diet. Brainstormed some meals ideas to help her include more colorful produce. She does like sweets and eats them  after her dinner often. Says its a habit, and will work on improving. She doesn't salt her foods often, but does likes some salty snacks. Encouraged her to look for lower sodium options and try to do less salt. Overall, she is doing very well, eating enough to support her body's needs. Will continue to monitor and support.      Intervention Plan   Intervention Prescribe, educate and counsel regarding individualized specific dietary modifications aiming towards targeted core components such as weight, hypertension, lipid management, diabetes, heart failure and other comorbidities.;Nutrition handout(s) given to patient.    Expected Outcomes Short Term Goal: Understand basic principles of dietary content, such as calories, fat, sodium, cholesterol and  nutrients.;Short Term Goal: A plan has been developed with personal nutrition goals set during dietitian appointment.;Long Term Goal: Adherence to prescribed nutrition plan.

## 2023-08-15 NOTE — ED Triage Notes (Signed)
 First nurse note: Brought from cardiac rehab to heart care due to dizziness. Pt reports intermittent dizziness for the last few days. Pt states dizziness came back after cardiac rehab

## 2023-08-15 NOTE — ED Triage Notes (Signed)
 Pt to ED from cardiac rehab with dizziness since her session. Pt is A&O x4 in triage, denies N/V.

## 2023-08-15 NOTE — Progress Notes (Signed)
 Daily Session Note  Patient Details  Name: Amy Lin MRN: [id number] Date of Birth: 25-Oct-1933 Referring Provider:   Flowsheet Row Cardiac Rehab from 05/22/2023 in [facility] Cardiac and Pulmonary Rehab  Referring Provider Dr. Newman Lawrence, MD    Encounter Date: 08/15/2023  Check In:  Session Check In - 08/15/23 0913       Check-In   Supervising physician immediately available to respond to emergencies See telemetry face sheet for immediately available ER MD    Location [facility]-Cardiac & Pulmonary Rehab    Staff Present Burnard Davenport RN,BSN,MPA;Margaret Best, MS, Exercise Physiologist;Jason Elnor RDN,LDN;Laureen Delores, BS, RRT, CPFT    Virtual Visit No    Medication changes reported     No    Fall or balance concerns reported    No    Warm-up and Cool-down Performed on first and last piece of equipment    Resistance Training Performed Yes    VAD Patient? No    PAD/SET Patient? No      Pain Assessment   Currently in Pain? No/denies             Social History   Tobacco Use  Smoking Status Never  Smokeless Tobacco Never    Goals Met:  Independence with exercise equipment Exercise tolerated well No report of concerns or symptoms today Strength training completed today  Goals Unmet:  Not Applicable  Comments: Pt able to follow exercise prescription today without complaint.  Will continue to monitor for progression.    Dr. Oneil Pinal is Medical Director for [facility] Cardiac Rehabilitation.  Dr. Fuad Aleskerov is Medical Director for [facility] Pulmonary Rehabilitation.

## 2023-08-15 NOTE — ED Notes (Signed)
 Pt verbalized understanding of discharge instructions. Opportunity for questions provided.

## 2023-08-16 ENCOUNTER — Encounter: Payer: Self-pay | Admitting: Cardiology

## 2023-08-16 NOTE — Telephone Encounter (Signed)
 Patient returned RN's call.

## 2023-08-16 NOTE — Telephone Encounter (Signed)
 Spoke with pt regarding her symptoms. Pt became lightheaded and bradycardic after cardiac rehab. The pt was advised to go to the ED where they did an EKG, blood work and chest xray but found no explanation for her symptoms. The pt is now home and reported feeling well this morning. Pt's blood pressure was 109/62 and heart rate 68. Pt was told the information provided would be sent to Dr. Elmira and his nurse for their suggestions. Pt verbalized understanding. All questions if any were answered.

## 2023-08-17 ENCOUNTER — Ambulatory Visit: Attending: Physician Assistant

## 2023-08-17 ENCOUNTER — Telehealth: Payer: Self-pay

## 2023-08-17 ENCOUNTER — Encounter: Admitting: *Deleted

## 2023-08-17 DIAGNOSIS — Z955 Presence of coronary angioplasty implant and graft: Secondary | ICD-10-CM | POA: Diagnosis not present

## 2023-08-17 DIAGNOSIS — R001 Bradycardia, unspecified: Secondary | ICD-10-CM

## 2023-08-17 NOTE — Telephone Encounter (Signed)
-----   Message from [place] sent at 08/16/2023  1:47 PM EDT ----- Regarding: Order needed. Hi Tesia Lybrand,  If you could please put in an order for a 7 day ZIO  we will get that processed. Thanks,  Rico ----- Message ----- From: Jerel Francina BRAVO Sent: 08/16/2023  11:21 AM EDT To: Rico DELENA [place]; Katrina LOISE Prudent; Nalla Purdy#  Hi there,   Appointment has been moved out to 8/19 10:40 AM with Dr. Elmira. Patient says she will be out of town around the first week of August, so she was alright with moving appointment out a few weeks + time to wear monitor. ----- Message ----- From: Tobie Mac DELENA, RN Sent: 08/16/2023  10:01 AM EDT To: [facility] [place] Scheduling  This patient is being followed by Dr. Newman Elmira. ----- Message ----- From: Lorene Lesley CROME, PA-C Sent: 08/16/2023   9:42 AM EDT To: [facility] Triage; [facility] Scheduling  Good morning. This patient was discharged from the ED yesterday. Can you please send her a 7 day Zio monitor for bradycardia and arrange outpatient follow up in 2-3 weeks? Thank you

## 2023-08-17 NOTE — Progress Notes (Unsigned)
Enrolled for [employer] to mail a ZIO XT long term holter monitor to the patients address on file.   Dr. Rosemary Holms to read.

## 2023-08-17 NOTE — Telephone Encounter (Signed)
 ZIO order placed per PA-C verbal order.

## 2023-08-17 NOTE — Progress Notes (Signed)
 Daily Session Note  Patient Details  Name: Amy Lin MRN: [id number] Date of Birth: [birth year] Referring Provider:   Flowsheet Row Cardiac Rehab from 05/22/2023 in [facility] Cardiac and Pulmonary Rehab  Referring Provider Dr. Newman Lawrence, MD    Encounter Date: 08/17/2023  Check In:  Session Check In - 08/17/23 0948       Check-In   Supervising physician immediately available to respond to emergencies See telemetry face sheet for immediately available ER MD    Location [facility]-Cardiac & Pulmonary Rehab    Staff Present Othel Durand, RN, BSN, CCRP;Maxon Conetta BS, Exercise Physiologist;Jason Elnor RDN,LDN;[employer]    Virtual Visit No    Medication changes reported     No    Fall or balance concerns reported    No    Warm-up and Cool-down Performed on first and last piece of equipment    Resistance Training Performed Yes    VAD Patient? No    PAD/SET Patient? No      Pain Assessment   Currently in Pain? No/denies             Social History   Tobacco Use  Smoking Status Never  Smokeless Tobacco Never    Goals Met:  Independence with exercise equipment Exercise tolerated well No report of concerns or symptoms today  Goals Unmet:  Not Applicable  Comments: Pt able to follow exercise prescription today without complaint.  Will continue to monitor for progression.    Dr. Oneil Pinal is Medical Director for [facility] Cardiac Rehabilitation.  Dr. Fuad Aleskerov is Medical Director for [facility] Pulmonary Rehabilitation.

## 2023-08-17 NOTE — Telephone Encounter (Signed)
 Pt calling back, she states she wont be back until 4pm

## 2023-08-17 NOTE — Telephone Encounter (Signed)
 Reviewed chart, pt went to ED---sent by PT with low HR. Hx of bradycardia. Pt now has heart monitor and f/u visit scheduled. Left msg for pt to call back.

## 2023-08-17 NOTE — Telephone Encounter (Signed)
 Consider 2-week Zio patch, diagnosis: Bradycardia, to rule out any significant arrhythmias.  Thanks MJP

## 2023-08-18 NOTE — Telephone Encounter (Signed)
 Patient is returning phone call and requesting to speak with a nurse in regard to this matter. Please advise.

## 2023-08-18 NOTE — Telephone Encounter (Signed)
 Spoke to patient and advised that the hospital doctor requested heart monitor for 1 week to be placed and order had been placed the other day. Advised pt of the heart monitor order. Pt advised to make sure she stays hydrated and eating well. Pt advised if becomes symptomatic then try to check blood pressure and heart rate. Advised to monitor for progressing/worsening symptoms and if heart rate maintains lower rate then to go to emergency room for further evaluation. Pt will send MyChart message Monday to advise how she is feeling.

## 2023-08-18 NOTE — Telephone Encounter (Signed)
 Please review mychart message concerning change in pt status.

## 2023-08-19 ENCOUNTER — Other Ambulatory Visit: Payer: Self-pay | Admitting: Nurse Practitioner

## 2023-08-19 DIAGNOSIS — E039 Hypothyroidism, unspecified: Secondary | ICD-10-CM

## 2023-08-22 ENCOUNTER — Ambulatory Visit: Payer: Self-pay

## 2023-08-22 ENCOUNTER — Encounter

## 2023-08-22 DIAGNOSIS — Z955 Presence of coronary angioplasty implant and graft: Secondary | ICD-10-CM

## 2023-08-22 NOTE — Telephone Encounter (Signed)
 FYI Only or Action Required?: FYI only for provider.  Patient was last seen in primary care on 06/01/2023 by Wendee Lynwood HERO, NP.  Called Nurse Triage reporting Fever, ear pain, swollen lymph nodes, runny nose.  Symptoms began several days ago.  Interventions attempted: Nothing.  Symptoms are: gradually worsening.  Triage Disposition: See Physician Within 24 Hours  Patient/caregiver understands and will follow disposition?: Yes    Reason for Disposition  Earache  (Exceptions: Brief ear pain of lasting less than 60 minutes, or earache occurring during air travel.)  Answer Assessment - Initial Assessment Questions 1. LOCATION: Which ear is involved?     Both ears are painful and feeling discomfort when swallowing 2. ONSET: When did the ear pain start?      A couple of days ago 3. SEVERITY: How bad is the pain?  (Scale 1-10; mild, moderate or severe)     Unable to assess 4. URI SYMPTOMS: Do you have a runny nose or cough?     Runny nose with clear drainage, no cough 5. FEVER: Do you have a fever? If Yes, ask: What is your temperature, how was it measured, and when did it start?     Low grade fever 6. CAUSE: Have you been swimming recently?, How often do you use Q-TIPS?, Have you had any recent air travel or scuba diving?     No 7. OTHER SYMPTOMS: Do you have any other symptoms? (e.g., decreased hearing, dizziness, headache, stiff neck, vomiting)     Swollen lymph node on right side under chin, runny nose (clear), facial pain from sinus pain  Protocols used: Earache-A-AH

## 2023-08-22 NOTE — Telephone Encounter (Signed)
 Duplicate. Please review other telephone encounter.

## 2023-08-22 NOTE — Progress Notes (Signed)
 Daily Session Note  Patient Details  Name: Amy Lin MRN: [id number] Date of Birth: Feb 04, 1933 Referring Provider:   Flowsheet Row Cardiac Rehab from 05/22/2023 in [facility] : [facility] Cardiac and Pulmonary Rehab  Referring Provider Dr. Newman Lawrence, MD    Encounter Date: 08/22/2023  Check In:  Session Check In - 08/22/23 0919       Check-In   Supervising physician immediately available to respond to emergencies See telemetry face sheet for immediately available ER MD    Location [facility]-Cardiac & Pulmonary Rehab    Staff Present Burnard Davenport RN,BSN,MPA;Maxon Conetta BS, Exercise Physiologist;Margaret Best, MS, Exercise Physiologist;Jason Elnor RDN,LDN    Virtual Visit No    Medication changes reported     No    Fall or balance concerns reported    No    Warm-up and Cool-down Performed on first and last piece of equipment    Resistance Training Performed Yes    VAD Patient? No    PAD/SET Patient? No      Pain Assessment   Currently in Pain? No/denies             Social History   Tobacco Use  Smoking Status Never  Smokeless Tobacco Never    Goals Met:  Independence with exercise equipment Exercise tolerated well No report of concerns or symptoms today Strength training completed today  Goals Unmet:  Not Applicable  Comments: Pt able to follow exercise prescription today without complaint.  Will continue to monitor for progression.    Dr. Oneil Pinal is Medical Director for [facility] Cardiac Rehabilitation.  Dr. Fuad Aleskerov is Medical Director for [facility] Pulmonary Rehabilitation.

## 2023-08-23 ENCOUNTER — Ambulatory Visit
Admission: RE | Admit: 2023-08-23 | Discharge: 2023-08-23 | Disposition: A | Discharge status: Home / Self Care | Payer: Self-pay | Source: Ambulatory Visit | Attending: Emergency Medicine | Admitting: Emergency Medicine

## 2023-08-23 VITALS — HR 61 | Temp 97.9°F | Resp 18

## 2023-08-23 DIAGNOSIS — J0100 Acute maxillary sinusitis, unspecified: Secondary | ICD-10-CM

## 2023-08-23 MED ORDER — AMOXICILLIN-POT CLAVULANATE 875-125 MG PO TABS
1.0000 | ORAL_TABLET | Freq: Two times a day (BID) | ORAL | 0 refills | Status: DC
Start: 2023-08-23 — End: 2023-09-08

## 2023-08-23 NOTE — ED Triage Notes (Signed)
 Patient complains bilateral ear pressure, sore throat, nasal congestion x 1 week. Took Coricidin cough medicine yesterday.

## 2023-08-23 NOTE — Discharge Instructions (Signed)
 Today you are being treated for sinus infection, begin Augmentin  twice daily for 7 days to clear any bacteria contributing to your symptoms  Continue use of Coricidin and nasal spray for further assistance with congestion and sinus pressure    You can take Tylenol  and/or Ibuprofen as needed for fever reduction and pain relief.   For cough: honey 1/2 to 1 teaspoon (you can dilute the honey in water or another fluid).  You can also use guaifenesin  and dextromethorphan for cough. You can use a humidifier for chest congestion and cough.  If you don't have a humidifier, you can sit in the bathroom with the hot shower running.      For sore throat: try warm salt water gargles, cepacol lozenges, throat spray, warm tea or water with lemon/honey, popsicles or ice, or OTC cold relief medicine for throat discomfort.   For congestion: take a daily anti-histamine like Zyrtec, Claritin, and a oral decongestant, such as pseudoephedrine.  You can also use Flonase  1-2 sprays in each nostril daily.   It is important to stay hydrated: drink plenty of fluids (water, gatorade/powerade/pedialyte, juices, or teas) to keep your throat moisturized and help further relieve irritation/discomfort.

## 2023-08-23 NOTE — Telephone Encounter (Signed)
Noted. Agree with being evaluated

## 2023-08-23 NOTE — ED Provider Notes (Signed)
 Amy Lin    CSN: [id number] Arrival date & time: 08/23/23  1259      History   Chief Complaint Chief Complaint  Patient presents with   Ear Fullness    Ear pain, low grade fever - Entered by patient   Otalgia   Nasal Congestion   Sore Throat    HPI Amy Lin is a 89 y.o. female.   Patient presents for evaluation of fever peaking at 99, bilateral ear fullness with pain to the right side, nasal congestion, postnasal drip, sore throat, sinus pressure along the bilateral cheeks and to the right side of the chin present for 7 days.  Started taking Coricidin yesterday.  Tolerating food and liquids.  Denies shortness of breath or wheezing.  Past Medical History:  Diagnosis Date   Allergy    Cataract Removed   Coronary artery disease    GERD (gastroesophageal reflux disease)    Hyperlipidemia    Hypertension    diuretic for edema at first   Hypothyroidism    Myocardial infarction [facility])    Neuromuscular disorder (HCC) Don't know   Osteoarthritis, multiple sites    Osteoporosis    Squamous cell carcinoma of skin 12/08/2022   Right pretibial. WD SCC. Mohs 01/31/23    Patient Active Problem List   Diagnosis Date Noted   Post PTCA 04/21/2023   Other secondary pulmonary hypertension (HCC) 04/17/2023   Nonrheumatic aortic valve stenosis 04/17/2023   B12 deficiency 01/23/2023   Pain and swelling of left lower extremity 11/30/2022   Mixed hyperlipidemia 09/23/2022   Acute pain of right shoulder 05/31/2022   Weakness 05/31/2022   Right ear pain 05/31/2022   Pain of left heel 04/13/2022   Acute non-recurrent maxillary sinusitis 02/01/2022   COVID-19 virus infection 01/06/2022   Sore throat 01/06/2022   Sensation of fullness in both ears 01/06/2022   History of ST elevation myocardial infarction (STEMI) 09/22/2021   Bradycardia 09/22/2021   Epigastric pain 05/29/2021   Preventative health care 11/30/2020   Paresthesia 11/30/2020   Acute pain of left  shoulder 11/30/2020   Exertional dyspnea 08/28/2020   PND (post-nasal drip) 08/28/2020   Sinus pressure 08/28/2020   Unstable angina (HCC)    Acute inferior myocardial infarction (HCC) 03/05/2020   CAD (coronary artery disease), native coronary artery 03/05/2020   Osteoarthritis 07/20/2017   Internal hemorrhoids 05/09/2017   History of adenomatous polyp of colon 02/03/2017   Fever 07/15/2016   Skin lesion 09/22/2008   Persistent cough 08/27/2008   Acute recurrent frontal sinusitis 07/30/2008   Seasonal allergies 11/01/2007   Hypercholesterolemia 09/06/2006   Hypothyroidism 08/24/2006   Essential hypertension 08/24/2006   GERD 08/24/2006   Osteoporosis 08/24/2006    Past Surgical History:  Procedure Laterality Date   ABDOMINAL HYSTERECTOMY     APPENDECTOMY     BACK SURGERY  1999   Discectomy and Ray cage   BLADDER REPAIR  ~2010   CARDIAC CATHETERIZATION  03/05/2020   CARPAL TUNNEL RELEASE  08/2013   COLONOSCOPY W/ BIOPSIES N/A 2016   CORONARY IMAGING/OCT N/A 04/21/2023   Procedure: CORONARY IMAGING/OCT;  Surgeon: Elmira Newman PARAS, MD;  Location: [facility] INVASIVE CV LAB;  Service: Cardiovascular;  Laterality: N/A;   CORONARY STENT INTERVENTION N/A 03/06/2020   Procedure: CORONARY STENT INTERVENTION;  Surgeon: Ladona Heinz, MD;  Location: [facility] INVASIVE CV LAB;  Service: Cardiovascular;  Laterality: N/A;   CORONARY STENT INTERVENTION N/A 04/21/2023   Procedure: CORONARY STENT INTERVENTION;  Surgeon: Elmira Newman PARAS,  MD;  Location: [facility] INVASIVE CV LAB;  Service: Cardiovascular;  Laterality: N/A;   CORONARY THROMBECTOMY N/A 03/05/2020   Procedure: Coronary Thrombectomy;  Surgeon: Ladona Heinz, MD;  Location: [facility] INVASIVE CV LAB;  Service: Cardiovascular;  Laterality: N/A;   CORONARY ULTRASOUND/IVUS N/A 03/06/2020   Procedure: Intravascular Ultrasound/IVUS;  Surgeon: Ladona Heinz, MD;  Location: [facility] INVASIVE CV LAB;  Service: Cardiovascular;  Laterality: N/A;   CORONARY/GRAFT ACUTE MI  REVASCULARIZATION N/A 03/05/2020   Procedure: Coronary/Graft Acute MI Revascularization;  Surgeon: Ladona Heinz, MD;  Location: [facility] INVASIVE CV LAB;  Service: Cardiovascular;  Laterality: N/A;   LEFT HEART CATH AND CORONARY ANGIOGRAPHY N/A 03/05/2020   Procedure: LEFT HEART CATH AND CORONARY ANGIOGRAPHY;  Surgeon: Ladona Heinz, MD;  Location: [facility] INVASIVE CV LAB;  Service: Cardiovascular;  Laterality: N/A;   LEFT HEART CATH AND CORONARY ANGIOGRAPHY N/A 04/21/2023   Procedure: LEFT HEART AND CORONARY ANGIOGRAPHY;  Surgeon: Elmira Newman PARAS, MD;  Location: [facility] INVASIVE CV LAB;  Service: Cardiovascular;  Laterality: N/A;   MENISCECTOMY Left 03/2013   Dr Kay   REFRACTIVE SURGERY Bilateral 07/2019   SPINE SURGERY     TUBAL LIGATION     UPPER GI ENDOSCOPY N/A 03/2015    OB History   No obstetric history on file.      Home Medications    Prior to Admission medications   Medication Sig Start Date End Date Taking? Authorizing Provider  amoxicillin -clavulanate (AUGMENTIN ) 875-125 MG tablet Take 1 tablet by mouth every 12 (twelve) hours. 08/23/23  Yes Torunn Chancellor, Shelba SAUNDERS, NP  aspirin  81 MG EC tablet Take 81 mg by mouth daily before breakfast.    [provider]  Cholecalciferol (VITAMIN D3) 2000 UNITS TABS Take 2,000 Units by mouth every evening.    [provider]  clopidogrel  (PLAVIX ) 75 MG tablet Take 1 tablet (75 mg total) by mouth daily with breakfast. 04/22/23   Janene Boer, PA  denosumab  (PROLIA ) 60 MG/ML SOSY injection Inject 60 mg into the skin every 6 (six) months. Patient not taking: Reported on 06/01/2023 06/14/22   [provider]  ezetimibe  (ZETIA ) 10 MG tablet Take 1 tablet (10 mg total) by mouth daily. Patient taking differently: Take 10 mg by mouth at bedtime. 01/04/23   Patwardhan, Newman PARAS, MD  fluticasone  (FLONASE ) 50 MCG/ACT nasal spray PLACE ONE SPRAY INTO BOTH NOSTRILS DAILY Patient taking differently: Place 1 spray into both nostrils daily as needed for  allergies. 12/28/22   Wendee Lynwood HERO, NP  furosemide  (LASIX ) 20 MG tablet Take 1 tablet (20 mg total) by mouth as needed for fluid. 05/04/23 04/28/24  Patwardhan, Newman PARAS, MD  hydrocortisone  2.5 % lotion Apply 1 Application topically 2 (two) times daily as needed (bug bites/skin irritation.). 06/01/23   Wendee Lynwood HERO, NP  levocetirizine (XYZAL) 5 MG tablet Take 5 mg by mouth daily as needed for allergies.    [provider]  levothyroxine  (SYNTHROID ) 75 MCG tablet TAKE ONE TABLET BY MOUTH ONCE A DAY. TAKE ON AN EMPTY STOMACH WITH A GLASS OF WATER ATLEAST 30-60 MIN BEFORE BREAKFAST 08/21/23   Wendee Lynwood HERO, NP  lisinopril  (ZESTRIL ) 5 MG tablet TAKE ONE TABLET BY MOUTH ONCE A DAY Patient taking differently: Take 5 mg by mouth at bedtime. 06/20/22   Custovic, Sabina, DO  Multiple Vitamins-Minerals (PRESERVISION AREDS 2 PO) Take 1 capsule by mouth in the morning and at bedtime.    [provider]  Naphazoline-Pheniramine (OPCON-A) 0.027-0.315 % SOLN Place 1-2 drops into both  eyes 3 (three) times daily as needed (irritated/red eyes).    [provider]  nitroGLYCERIN  (NITROSTAT ) 0.4 MG SL tablet Place 1 tablet (0.4 mg total) under the tongue every 5 (five) minutes as needed for chest pain. 04/17/23   Patwardhan, Newman PARAS, MD  ondansetron  (ZOFRAN -ODT) 4 MG disintegrating tablet Take 1 tablet (4 mg total) by mouth every 8 (eight) hours as needed for nausea or vomiting. 03/29/23   Avelina, Amy E, MD  pantoprazole  (PROTONIX ) 20 MG tablet TAKE ONE TABLET BY MOUTH TWICE A DAY 03/17/23   Wendee Lynwood HERO, NP  potassium chloride  (KLOR-CON  M) 10 MEQ tablet Take 1 tablet (10 mEq total) by mouth daily as needed (with use of lasix ). 05/04/23   Patwardhan, Manish J, MD  potassium chloride  (KLOR-CON ) 10 MEQ tablet Take 10 mEq by mouth daily. 05/04/23   [provider]  rosuvastatin  (CRESTOR ) 20 MG tablet Take 1 tablet (20 mg total) by mouth daily. 07/13/23   Patwardhan, Newman PARAS, MD    Family  History Family History  Problem Relation Age of Onset   Heart disease Mother    Stroke Father    Hypertension Father    Heart disease Father    Diabetes Father    Heart disease Sister    Heart disease Sister    Breast cancer Neg Hx     Social History Social History   Tobacco Use   Smoking status: Never   Smokeless tobacco: Never  Vaping Use   Vaping status: Never Used  Substance Use Topics   Alcohol use: No   Drug use: No     Allergies   Patient has no known allergies.   Review of Systems Review of Systems   Physical Exam Triage Vital Signs ED Triage Vitals  Encounter Vitals Group     BP --      Girls Systolic BP Percentile --      Girls Diastolic BP Percentile --      Boys Systolic BP Percentile --      Boys Diastolic BP Percentile --      Pulse Rate 08/23/23 1321 61     Resp 08/23/23 1321 18     Temp 08/23/23 1321 97.9 F (36.6 C)     Temp Source 08/23/23 1321 Oral     SpO2 08/23/23 1319 98 %     Weight --      Height --      Head Circumference --      Peak Flow --      Pain Score 08/23/23 1318 5     Pain Loc --      Pain Education --      Exclude from Growth Chart --    No data found.  Updated Vital Signs Pulse 61   Temp 97.9 F (36.6 C) (Oral)   Resp 18   SpO2 98%   Visual Acuity Right Eye Distance:   Left Eye Distance:   Bilateral Distance:    Right Eye Near:   Left Eye Near:    Bilateral Near:     Physical Exam Constitutional:      Appearance: Normal appearance.  HENT:     Head: Normocephalic.     Right Ear: A middle ear effusion is present.     Left Ear: A middle ear effusion is present.     Nose:     Right Sinus: Maxillary sinus tenderness present.     Left Sinus: Maxillary sinus tenderness present.  Mouth/Throat:     Pharynx: No oropharyngeal exudate or posterior oropharyngeal erythema.  Eyes:     Extraocular Movements: Extraocular movements intact.  Cardiovascular:     Rate and Rhythm: Normal rate and regular  rhythm.     Pulses: Normal pulses.     Heart sounds: Normal heart sounds.  Pulmonary:     Effort: Pulmonary effort is normal.     Breath sounds: Normal breath sounds.  Musculoskeletal:     Cervical back: Normal range of motion and neck supple.  Lymphadenopathy:     Cervical: Cervical adenopathy present.  Neurological:     Mental Status: She is alert and oriented to person, place, and time.      UC Treatments / Results  Labs (all labs ordered are listed, but only abnormal results are displayed) Labs Reviewed - No data to display  EKG   Radiology No results found.  Procedures Procedures (including critical care time)  Medications Ordered in UC Medications - No data to display  Initial Impression / Assessment and Plan / UC Course  I have reviewed the triage vital signs and the nursing notes.  Pertinent labs & imaging results that were available during my care of the patient were reviewed by me and considered in my medical decision making (see chart for details).  Acute nonrecurrent maxillary sinusitis  Patient is in no signs of distress nor toxic appearing.  Vital signs are stable.  Low suspicion for pneumonia, pneumothorax or bronchitis and therefore will defer imaging.  Present for 7 days without signs of resolution consistent with a sinusitis, prescribed Augmentin .May use additional over-the-counter medications as needed for supportive care.  May follow-up with urgent care as needed if symptoms persist or worsen.  Final Clinical Impressions(s) / UC Diagnoses   Final diagnoses:  Acute non-recurrent maxillary sinusitis     Discharge Instructions      Today you are being treated for sinus infection, begin Augmentin  twice daily for 7 days to clear any bacteria contributing to your symptoms  Continue use of Coricidin and nasal spray for further assistance with congestion and sinus pressure    You can take Tylenol  and/or Ibuprofen as needed for fever reduction and  pain relief.   For cough: honey 1/2 to 1 teaspoon (you can dilute the honey in water or another fluid).  You can also use guaifenesin  and dextromethorphan for cough. You can use a humidifier for chest congestion and cough.  If you don't have a humidifier, you can sit in the bathroom with the hot shower running.      For sore throat: try warm salt water gargles, cepacol lozenges, throat spray, warm tea or water with lemon/honey, popsicles or ice, or OTC cold relief medicine for throat discomfort.   For congestion: take a daily anti-histamine like Zyrtec, Claritin, and a oral decongestant, such as pseudoephedrine.  You can also use Flonase  1-2 sprays in each nostril daily.   It is important to stay hydrated: drink plenty of fluids (water, gatorade/powerade/pedialyte, juices, or teas) to keep your throat moisturized and help further relieve irritation/discomfort.    ED Prescriptions     Medication Sig Dispense Auth. Provider   amoxicillin -clavulanate (AUGMENTIN ) 875-125 MG tablet Take 1 tablet by mouth every 12 (twelve) hours. 14 tablet Elsi Stelzer R, NP      PDMP not reviewed this encounter.   Teresa Shelba SAUNDERS, NP 08/23/23 [phone]

## 2023-08-24 ENCOUNTER — Encounter

## 2023-08-24 DIAGNOSIS — Z955 Presence of coronary angioplasty implant and graft: Secondary | ICD-10-CM

## 2023-08-24 NOTE — Progress Notes (Signed)
 Daily Session Note  Patient Details  Name: Amy Lin MRN: [id number] Date of Birth: Dec 22, 1933 Referring Provider:   Flowsheet Row Cardiac Rehab from 05/22/2023 in [facility] Cardiac and Pulmonary Rehab  Referring Provider Dr. Newman Lawrence, MD    Encounter Date: 08/24/2023  Check In:  Session Check In - 08/24/23 9077       Check-In   Supervising physician immediately available to respond to emergencies See telemetry face sheet for immediately available ER MD    Location [facility]-Cardiac & Pulmonary Rehab    Staff Present Burnard Davenport RN,BSN,MPA;Maxon Conetta BS, Exercise Physiologist;Margaret Best, MS, Exercise Physiologist;Kristen Coble RN,BC,MSN    Virtual Visit No    Medication changes reported     No    Fall or balance concerns reported    No    Warm-up and Cool-down Performed on first and last piece of equipment    Resistance Training Performed Yes    VAD Patient? No    PAD/SET Patient? No      Pain Assessment   Currently in Pain? No/denies             Social History   Tobacco Use  Smoking Status Never  Smokeless Tobacco Never    Goals Met:  Independence with exercise equipment Exercise tolerated well No report of concerns or symptoms today Strength training completed today  Goals Unmet:  Not Applicable  Comments: Pt able to follow exercise prescription today without complaint.  Will continue to monitor for progression.    Dr. Oneil Pinal is Medical Director for [facility] Cardiac Rehabilitation.  Dr. Fuad Aleskerov is Medical Director for [facility] Pulmonary Rehabilitation.

## 2023-08-25 ENCOUNTER — Ambulatory Visit: Admitting: Cardiology

## 2023-08-25 ENCOUNTER — Ambulatory Visit: Payer: Self-pay

## 2023-08-25 ENCOUNTER — Ambulatory Visit: Admitting: Nurse Practitioner

## 2023-08-25 VITALS — BP 138/62 | HR 75 | Temp 98.9°F | Ht 65.0 in | Wt 143.2 lb

## 2023-08-25 DIAGNOSIS — R600 Localized edema: Secondary | ICD-10-CM | POA: Diagnosis not present

## 2023-08-25 DIAGNOSIS — R233 Spontaneous ecchymoses: Secondary | ICD-10-CM

## 2023-08-25 DIAGNOSIS — J0141 Acute recurrent pansinusitis: Secondary | ICD-10-CM | POA: Diagnosis not present

## 2023-08-25 LAB — CBC WITH DIFFERENTIAL/PLATELET
Basophils Absolute: 0 K/uL (ref 0.0–0.1)
Basophils Relative: 0.4 % (ref 0.0–3.0)
Eosinophils Absolute: 0.1 K/uL (ref 0.0–0.7)
Eosinophils Relative: 1.6 % (ref 0.0–5.0)
HCT: 35.6 % — ABNORMAL LOW (ref 36.0–46.0)
Hemoglobin: 12 g/dL (ref 12.0–15.0)
Lymphocytes Relative: 9.7 % — ABNORMAL LOW (ref 12.0–46.0)
Lymphs Abs: 0.8 K/uL (ref 0.7–4.0)
MCHC: 33.6 g/dL (ref 30.0–36.0)
MCV: 93.4 fl (ref 78.0–100.0)
Monocytes Absolute: 1 K/uL (ref 0.1–1.0)
Monocytes Relative: 11.6 % (ref 3.0–12.0)
Neutro Abs: 6.4 K/uL (ref 1.4–7.7)
Neutrophils Relative %: 76.7 % (ref 43.0–77.0)
Platelets: 119 K/uL — ABNORMAL LOW (ref 150.0–400.0)
RBC: 3.81 Mil/uL — ABNORMAL LOW (ref 3.87–5.11)
RDW: 13.9 % (ref 11.5–15.5)
WBC: 8.4 K/uL (ref 4.0–10.5)

## 2023-08-25 LAB — COMPREHENSIVE METABOLIC PANEL WITH GFR
ALT: 10 U/L (ref 0–35)
AST: 17 U/L (ref 0–37)
Albumin: 4.2 g/dL (ref 3.5–5.2)
Alkaline Phosphatase: 56 U/L (ref 39–117)
BUN: 17 mg/dL (ref 6–23)
CO2: 29 meq/L (ref 19–32)
Calcium: 9.5 mg/dL (ref 8.4–10.5)
Chloride: 102 meq/L (ref 96–112)
Creatinine, Ser: 0.91 mg/dL (ref 0.40–1.20)
GFR: 56.7 mL/min — ABNORMAL LOW (ref >60.00)
Glucose, Bld: 96 mg/dL (ref 70–99)
Potassium: 4.7 meq/L (ref 3.5–5.1)
Sodium: 138 meq/L (ref 135–145)
Total Bilirubin: 0.6 mg/dL (ref 0.2–1.2)
Total Protein: 7.7 g/dL (ref 6.0–8.3)

## 2023-08-25 LAB — BRAIN NATRIURETIC PEPTIDE: Pro B Natriuretic peptide (BNP): 249 pg/mL — ABNORMAL HIGH (ref 0.0–100.0)

## 2023-08-25 MED ORDER — DOXYCYCLINE HYCLATE 100 MG PO TABS: 100.0000 mg | ORAL_TABLET | Freq: Two times a day (BID) | ORAL | 0 refills | Status: AC

## 2023-08-25 NOTE — Telephone Encounter (Signed)
 I spoke with pt and she already has appt with Adina Crandall NP  08/25/23 at 11AM. Pt has no difficulty in breathing and no swelling in neck throat mouth or tongue. UC & ED precautions given and pt voiced understanding.sending note to M CableNP.

## 2023-08-25 NOTE — Telephone Encounter (Signed)
 Amy Lin

## 2023-08-25 NOTE — Assessment & Plan Note (Signed)
 Clinically dx at [facility]. Approprate. Possible reaction to Augmentin . Will dc and start doxycycline  100mg  BID

## 2023-08-25 NOTE — Telephone Encounter (Signed)
 FYI Only or Action Required?: Action required by provider: request for appointment.  Patient was last seen in primary care on 06/01/2023 by Wendee Lynwood HERO, NP.  Called Nurse Triage reporting Sinusitis.  Symptoms began yesterday.  Interventions attempted: Nothing.  Symptoms are: unchanged.  Triage Disposition: See Physician Within 24 Hours  Patient/caregiver understands and will follow disposition?: YesCopied from CRM [id number]. Topic: Clinical - Red Word Triage >> Aug 25, 2023  8:02 AM Donna BRAVO wrote: Red Word that prompted transfer to Nurse Triage: patient was in urgent care 08/23/23 diagnosis with sinus infection.  Yesterday afternoon around both ankles, 4 inches above ankles turned turned red, burning, very painful, warm to the touch, head still hurts Reason for Disposition  Taking new prescription antibiotic  (Exception: Finished taking new prescription antibiotic.)  Answer Assessment - Initial Assessment Questions 1. APPEARANCE of RASH: What does the rash look like? (e.g., spots, blisters, raised areas, skin peeling, scaly)     Red, swollen 2. SIZE: How big are the spots? (e.g., tip of pen, eraser, coin; inches, centimeters)     Not sure 3. LOCATION: Where is the rash located?     Shins of both legs 4. COLOR: What color is the rash? (Note: It is difficult to assess rash color in people with darker-colored skin. When this situation occurs, simply ask the caller to describe what they see.)     red 5. ONSET: When did the rash begin?     Yesterday  6. FEVER: Do you have a fever? If Yes, ask: What is your temperature, how was it measured, and when did it start?     Last night 100.9 and this morning 99.3 7. ITCHING: Does the rash itch? If Yes, ask: How bad is the itch? (Scale 1-10; or mild, moderate, severe)     denies 8. CAUSE: What do you think is causing the rash?     Possibly starting augmentin   9. NEW MEDICINES: What new medicines are you taking? (e.g.,  name of antibiotic) When did you start taking this medication?.     Augmentin   10. OTHER SYMPTOMS: Do you have any other symptoms? (e.g., sore throat, fever, joint pain)       Sinus congestion, both ear pain   Pt went to UC on 7/23 for sinus infection. Pt was prescribed Augmentin . Pt stated she has taken that before with no issues. Yesterday pt noticed 4 inches above ankle bone has a painful burning rash on both legs. Pt called after hours line last night and was told to stop taking medication and to follow up with pcp. Pt still has sinus congestion with fever. Pt denies any difficulty breathing.  Protocols used: Rash - Widespread On Drugs-A-AH

## 2023-08-25 NOTE — Assessment & Plan Note (Signed)
 On asa and plavix  s/p stent placement. Will check cbc, cmp, and bnp. Continue  with otc tylenol  as directed. Elevation, and cool compresses

## 2023-08-25 NOTE — Telephone Encounter (Signed)
 Will evaluate in office

## 2023-08-25 NOTE — Progress Notes (Signed)
 Acute Office Visit  Subjective:     Patient ID: Amy Lin, female    DOB: September 04, 1933, 89 y.o.   MRN: [id number]  Chief Complaint  Patient presents with   Sinus Problem    Pt complains of sinus issue that started a week ago. Pt complains of ear pain. Pt was prescribed augmentin  at urgent care.    Ankle Pain    Pt complains of redness and pain on ankles that started yesterday. Pt thinks its due to medication augmentin .  Pt has used benadryl  and hydrocortisone  cream. Burning & achy pain.     Patient is in today for multople complaints  Sick symptoms: started approx 1 week ago. She was seen at urgent care on 08/23/2023 and was prescribed augmentin  States that she was not around anyone that she knoew was sick. She did go to church and [employer].  State that she has taekn 2 doses of the medication. States that she did not take yesterdays dose. States that she went ot [facility] yesterday and then went outside yesterday and watered some flowers.   States that yesterday afternoon her right lower leg started hurting. She noticed that she had a rash on her legs. States that she was triaged and was told benadryl , extra strength tylenol  and a cool compress.  Review of Systems  Constitutional:  Positive for malaise/fatigue. Negative for chills and fever.  HENT:  Positive for ear pain and sinus pain. Negative for sore throat.   Respiratory:  Negative for cough and shortness of breath.   Cardiovascular:  Positive for leg swelling.  Gastrointestinal:  Negative for abdominal pain, constipation, diarrhea, nausea and vomiting.  Skin:  Positive for rash. Negative for itching.  Neurological:  Positive for headaches.        Objective:    BP 138/62   Pulse 75   Temp 98.9 F (37.2 C) (Oral)   Ht 5' 5 (1.651 m)   Wt 143 lb 3.2 oz (65 kg)   SpO2 95%   BMI 23.83 kg/m    Physical Exam Vitals and nursing note reviewed.  Constitutional:      Appearance: Normal appearance.  HENT:      Right Ear: Ear canal and external ear normal. There is no impacted cerumen.     Left Ear: Tympanic membrane, ear canal and external ear normal. There is no impacted cerumen.     Mouth/Throat:     Mouth: Mucous membranes are moist.     Pharynx: Posterior oropharyngeal erythema present.  Cardiovascular:     Rate and Rhythm: Normal rate and regular rhythm.     Heart sounds: Normal heart sounds.  Pulmonary:     Effort: Pulmonary effort is normal.     Breath sounds: Normal breath sounds.  Musculoskeletal:     Right lower leg: Edema present.     Left lower leg: Edema present.  Lymphadenopathy:     Cervical: No cervical adenopathy.  Skin:    Findings: Rash present.         Comments: Non blanching petechial style rash   Neurological:     Mental Status: She is alert.     No results found for any visits on 08/25/23.      Assessment & Plan:   Problem List Items Addressed This Visit       Respiratory   Acute recurrent pansinusitis - Primary   Clinically dx at [facility]. Approprate. Possible reaction to Augmentin . Will dc and start doxycycline  100mg  BID  Relevant Medications   doxycycline  (VIBRA -TABS) 100 MG tablet     Musculoskeletal and Integument   Petechial rash   On asa and plavix  s/p stent placement. Will check cbc, cmp, and bnp. Continue  with otc tylenol  as directed. Elevation, and cool compresses      Relevant Orders   CBC with Differential/Platelet   Comprehensive metabolic panel with GFR     Other   Lower extremity edema   Pending BNP. Recent cardiac stenting      Relevant Orders   Brain natriuretic peptide    Meds ordered this encounter  Medications   doxycycline  (VIBRA -TABS) 100 MG tablet    Sig: Take 1 tablet (100 mg total) by mouth 2 (two) times daily for 7 days.    Dispense:  14 tablet    Refill:  0    Supervising Provider:   RANDEEN HARDY A [[id number]]    Return if symptoms worsen or fail to improve.  Adina Crandall, NP

## 2023-08-25 NOTE — Patient Instructions (Signed)
 Nice to see you today  Stop the augmentin  and start the doxycycline  It is ok to use the cool compresses and tylenol  as directed

## 2023-08-25 NOTE — Assessment & Plan Note (Signed)
 Pending BNP. Recent cardiac stenting

## 2023-08-28 ENCOUNTER — Telehealth: Payer: Self-pay | Admitting: Nurse Practitioner

## 2023-08-28 NOTE — Telephone Encounter (Signed)
 Can we call and see how the sinus infection is doing and the rash on her lower legs with the swelling

## 2023-08-28 NOTE — Telephone Encounter (Signed)
 Called pt.   Pt complains of sinus infection is better, no complaints.  Pt states that the rash has gotten smaller, redness is still there, skin is not as tender as it was during OV. States that the leg that was worse off doesn't hurt as bad anymore.  Pt says she has 3 days left of doxycyline. She has been using cold compresses and elevating her legs.

## 2023-08-28 NOTE — Telephone Encounter (Signed)
-----   Message from Amy Lin [facility] sent at 08/25/2023 11:31 AM EDT ----- Regarding: raash/sinus infection Can we call and see how the sinus infection is doing and the rash on her lower legs with the swelling

## 2023-08-29 ENCOUNTER — Encounter

## 2023-08-29 DIAGNOSIS — Z955 Presence of coronary angioplasty implant and graft: Secondary | ICD-10-CM | POA: Diagnosis not present

## 2023-08-29 NOTE — Progress Notes (Signed)
 Discharge Summary Name: Amy Lin. Hagood DOB: [birth year]  [name] graduated today from  rehab with 36 sessions completed.  Details of the patient's exercise prescription and what She needs to do in order to continue the prescription and progress were discussed with patient.  Patient was given a copy of prescription and goals.  Patient verbalized understanding. [name] plans to continue to exercise by walking and using his treadmill.   6 Minute Walk     Row Name 05/22/23 1530 08/15/23 0949       6 Minute Walk   Phase Initial Discharge    Distance 1210 feet 1385 feet    Distance % Change -- 14.46 %    Distance Feet Change -- 175 ft    Walk Time 6 minutes 6 minutes    # of Rest Breaks 0 0    MPH 2.29 2.62    METS 2.15 2.53    RPE 12 12    Perceived Dyspnea  1 1    VO2 Peak 7.53 8.85    Symptoms No No    Resting HR 60 bpm 67 bpm    Resting BP 116/58 134/62    Resting Oxygen Saturation  97 % 95 %    Exercise Oxygen Saturation  during 6 min walk 97 % 97 %    Max Ex. HR 101 bpm 97 bpm    Max Ex. BP 152/68 168/64    2 Minute Post BP 148/66 136/60

## 2023-08-29 NOTE — Progress Notes (Signed)
 Cardiac Individual Treatment Plan  Patient Details  Name: Amy Lin MRN: [id number] Date of Birth: [birth year] Referring Provider:   Flowsheet Row Cardiac Rehab from 05/22/2023 in [facility] Cardiac and Pulmonary Rehab  Referring Provider Dr. Newman Lawrence, MD    Initial Encounter Date:  Flowsheet Row Cardiac Rehab from 05/22/2023 in [facility] Cardiac and Pulmonary Rehab  Date 05/22/23    Visit Diagnosis: Status post coronary artery stent placement  Patient's Home Medications on Admission:  Current Outpatient Medications:    amoxicillin -clavulanate (AUGMENTIN ) 875-125 MG tablet, Take 1 tablet by mouth every 12 (twelve) hours., Disp: 14 tablet, Rfl: 0   aspirin  81 MG EC tablet, Take 81 mg by mouth daily before breakfast., Disp: , Rfl:    Cholecalciferol (VITAMIN D3) 2000 UNITS TABS, Take 2,000 Units by mouth every evening., Disp: , Rfl:    clopidogrel  (PLAVIX ) 75 MG tablet, Take 1 tablet (75 mg total) by mouth daily with breakfast., Disp: 90 tablet, Rfl: 3   denosumab  (PROLIA ) 60 MG/ML SOSY injection, Inject 60 mg into the skin every 6 (six) months., Disp: , Rfl:    doxycycline  (VIBRA -TABS) 100 MG tablet, Take 1 tablet (100 mg total) by mouth 2 (two) times daily for 7 days., Disp: 14 tablet, Rfl: 0   ezetimibe  (ZETIA ) 10 MG tablet, Take 1 tablet (10 mg total) by mouth daily. (Patient taking differently: Take 10 mg by mouth at bedtime.), Disp: 90 tablet, Rfl: 2   fluticasone  (FLONASE ) 50 MCG/ACT nasal spray, PLACE ONE SPRAY INTO BOTH NOSTRILS DAILY (Patient taking differently: Place 1 spray into both nostrils daily as needed for allergies.), Disp: 16 mL, Rfl: 2   furosemide  (LASIX ) 20 MG tablet, Take 1 tablet (20 mg total) by mouth as needed for fluid., Disp: 90 tablet, Rfl: 3   hydrocortisone  2.5 % lotion, Apply 1 Application topically 2 (two) times daily as needed (bug bites/skin irritation.)., Disp: 59 mL, Rfl: 1   levocetirizine (XYZAL) 5 MG tablet, Take 5 mg by mouth daily as needed for  allergies., Disp: , Rfl:    levothyroxine  (SYNTHROID ) 75 MCG tablet, TAKE ONE TABLET BY MOUTH ONCE A DAY. TAKE ON AN EMPTY STOMACH WITH A GLASS OF WATER ATLEAST 30-60 MIN BEFORE BREAKFAST, Disp: 90 tablet, Rfl: 1   lisinopril  (ZESTRIL ) 5 MG tablet, TAKE ONE TABLET BY MOUTH ONCE A DAY (Patient taking differently: Take 5 mg by mouth at bedtime.), Disp: 90 tablet, Rfl: PRN   Multiple Vitamins-Minerals (PRESERVISION AREDS 2 PO), Take 1 capsule by mouth in the morning and at bedtime., Disp: , Rfl:    Naphazoline-Pheniramine (OPCON-A) 0.027-0.315 % SOLN, Place 1-2 drops into both eyes 3 (three) times daily as needed (irritated/red eyes)., Disp: , Rfl:    nitroGLYCERIN  (NITROSTAT ) 0.4 MG SL tablet, Place 1 tablet (0.4 mg total) under the tongue every 5 (five) minutes as needed for chest pain., Disp: 30 tablet, Rfl: 3   ondansetron  (ZOFRAN -ODT) 4 MG disintegrating tablet, Take 1 tablet (4 mg total) by mouth every 8 (eight) hours as needed for nausea or vomiting., Disp: 10 tablet, Rfl: 0   pantoprazole  (PROTONIX ) 20 MG tablet, TAKE ONE TABLET BY MOUTH TWICE A DAY, Disp: 180 tablet, Rfl: 2   potassium chloride  (KLOR-CON  M) 10 MEQ tablet, Take 1 tablet (10 mEq total) by mouth daily as needed (with use of lasix )., Disp: 30 tablet, Rfl: 3   potassium chloride  (KLOR-CON ) 10 MEQ tablet, Take 10 mEq by mouth daily., Disp: , Rfl:    rosuvastatin  (CRESTOR ) 20 MG tablet,  Take 1 tablet (20 mg total) by mouth daily., Disp: 90 tablet, Rfl: 3  Past Medical History: Past Medical History:  Diagnosis Date   Allergy    Cataract Removed   Coronary artery disease    GERD (gastroesophageal reflux disease)    Hyperlipidemia    Hypertension    diuretic for edema at first   Hypothyroidism    Myocardial infarction [facility])    Neuromuscular disorder (HCC) Don't know   Osteoarthritis, multiple sites    Osteoporosis    Squamous cell carcinoma of skin 12/08/2022   Right pretibial. WD SCC. Mohs 01/31/23    Tobacco Use: Social  History   Tobacco Use  Smoking Status Never  Smokeless Tobacco Never    Labs: Review Flowsheet  More data exists      Latest Ref Rng & Units 05/31/2022 07/14/2022 07/15/2022 04/17/2023 06/01/2023  Labs for ITP Cardiac and Pulmonary Rehab  Cholestrol 0 - 200 mg/dL 776  - - 868  877   LDL (calc) 0 - 99 mg/dL 850  - 82     59  42   HDL-C >39.00 mg/dL 53.39  - 54     52  47.99   Trlycerides 0.0 - 149.0 mg/dL 861.9  - 862     886  862.9   Hemoglobin A1c 4.6 - 6.5 % - 6.6  C    - - 6.7     Details      C Corrected result   This result is from an external source.          Exercise Target Goals: Exercise Program Goal: Individual exercise prescription set using results from initial 6 min walk test and THRR while considering  patient's activity barriers and safety.   Exercise Prescription Goal: Initial exercise prescription builds to 30-45 minutes a day of aerobic activity, 2-3 days per week.  Home exercise guidelines will be given to patient during program as part of exercise prescription that the participant will acknowledge.   Education: Aerobic Exercise: - Group verbal and visual presentation on the components of exercise prescription. Introduces F.I.T.T principle from ACSM for exercise prescriptions.  Reviews F.I.T.T. principles of aerobic exercise including progression. Written material given at graduation. Flowsheet Row Cardiac Rehab from 08/10/2023 in [facility] Cardiac and Pulmonary Rehab  Date 06/29/23  Educator MB  Instruction Review Code 1- Verbalizes Understanding    Education: Resistance Exercise: - Group verbal and visual presentation on the components of exercise prescription. Introduces F.I.T.T principle from ACSM for exercise prescriptions  Reviews F.I.T.T. principles of resistance exercise including progression. Written material given at graduation. Flowsheet Row Cardiac Rehab from 08/10/2023 in [facility] Cardiac and Pulmonary Rehab  Date 06/22/23  Educator MB  Instruction  Review Code 1- [employer] Understanding     Education: Exercise & Equipment Safety: - Individual verbal instruction and demonstration of equipment use and safety with use of the equipment. Flowsheet Row Cardiac Rehab from 08/10/2023 in [facility] Cardiac and Pulmonary Rehab  Date 05/10/23  Educator jh  Instruction Review Code 1- Verbalizes Understanding    Education: Exercise Physiology & General Exercise Guidelines: - Group verbal and written instruction with models to review the exercise physiology of the cardiovascular system and associated critical values. Provides general exercise guidelines with specific guidelines to those with heart or lung disease.  Flowsheet Row Cardiac Rehab from 08/10/2023 in [facility] Cardiac and Pulmonary Rehab  Date 06/15/23  Educator MB  Instruction Review Code 1- [employer] Understanding    Education: Flexibility, Balance, Mind/Body Relaxation: -  Group verbal and visual presentation with interactive activity on the components of exercise prescription. Introduces F.I.T.T principle from ACSM for exercise prescriptions. Reviews F.I.T.T. principles of flexibility and balance exercise training including progression. Also discusses the mind body connection.  Reviews various relaxation techniques to help reduce and manage stress (i.e. Deep breathing, progressive muscle relaxation, and visualization). Balance handout provided to take home. Written material given at graduation. Flowsheet Row Cardiac Rehab from 08/10/2023 in [facility] Cardiac and Pulmonary Rehab  Date 06/22/23  Educator MB  Instruction Review Code 1- Verbalizes Understanding    Activity Barriers & Risk Stratification:  Activity Barriers & Cardiac Risk Stratification - 05/22/23 1527       Activity Barriers & Cardiac Risk Stratification   Activity Barriers Arthritis;Joint Problems;Muscular Weakness    Cardiac Risk Stratification Moderate          6 Minute Walk:  6 Minute Walk     Row Name 05/22/23 1530  08/15/23 0949       6 Minute Walk   Phase Initial Discharge    Distance 1210 feet 1385 feet    Distance % Change -- 14.46 %    Distance Feet Change -- 175 ft    Walk Time 6 minutes 6 minutes    # of Rest Breaks 0 0    MPH 2.29 2.62    METS 2.15 2.53    RPE 12 12    Perceived Dyspnea  1 1    VO2 Peak 7.53 8.85    Symptoms No No    Resting HR 60 bpm 67 bpm    Resting BP 116/58 134/62    Resting Oxygen Saturation  97 % 95 %    Exercise Oxygen Saturation  during 6 min walk 97 % 97 %    Max Ex. HR 101 bpm 97 bpm    Max Ex. BP 152/68 168/64    2 Minute Post BP 148/66 136/60       Oxygen Initial Assessment:   Oxygen Re-Evaluation:   Oxygen Discharge (Final Oxygen Re-Evaluation):   Initial Exercise Prescription:  Initial Exercise Prescription - 05/22/23 1500       Date of Initial Exercise RX and Referring Provider   Date 05/22/23    Referring Provider Dr. Newman Lawrence, MD      Oxygen   Maintain Oxygen Saturation 88% or higher      Treadmill   MPH 1.8    Grade 0    Minutes 15    METs 2.38      Recumbant Bike   Level 1    RPM 50    Watts 15    Minutes 15    METs 2.15      NuStep   Level 2    SPM 80    Minutes 15    METs 2.15      Biostep-RELP   Level 1    SPM 50    Minutes 15    METs 2.15      Prescription Details   Frequency (times per week) 2    Duration Progress to 30 minutes of continuous aerobic without signs/symptoms of physical distress      Intensity   THRR 40-80% of Max Heartrate 89-118    Ratings of Perceived Exertion 11-13    Perceived Dyspnea 0-4      Progression   Progression Continue to progress workloads to maintain intensity without signs/symptoms of physical distress.      Paramedic Prescription  Yes    Weight 2 lb    Reps 10-15          Perform Capillary Blood Glucose checks as needed.  Exercise Prescription Changes:   Exercise Prescription Changes     Row Name 05/22/23 1500 06/12/23  1600 06/29/23 1700 07/14/23 0700 07/25/23 0900     Response to Exercise   Blood Pressure (Admit) 116/58 122/60 110/60 118/60 --   Blood Pressure (Exercise) 152/68 146/64 142/60 144/74 --   Blood Pressure (Exit) 148/66 126/62 120/60 134/76 --   Heart Rate (Admit) 60 bpm 67 bpm 66 bpm 60 bpm --   Heart Rate (Exercise) 101 bpm 106 bpm 108 bpm 110 bpm --   Heart Rate (Exit) 67 bpm 75 bpm 74 bpm 68 bpm --   Oxygen Saturation (Admit) 97 % -- -- -- --   Oxygen Saturation (Exercise) 97 % -- -- -- --   Rating of Perceived Exertion (Exercise) 12 13 13 13  --   Perceived Dyspnea (Exercise) 1 -- -- -- --   Symptoms none none none none --   Comments Results First two weeks of rehab -- -- --   Duration -- Continue with 30 min of aerobic exercise without signs/symptoms of physical distress. Continue with 30 min of aerobic exercise without signs/symptoms of physical distress. Continue with 30 min of aerobic exercise without signs/symptoms of physical distress. Continue with 30 min of aerobic exercise without signs/symptoms of physical distress.   Intensity -- THRR unchanged THRR unchanged THRR unchanged THRR unchanged     Progression   Progression -- Continue to progress workloads to maintain intensity without signs/symptoms of physical distress. Continue to progress workloads to maintain intensity without signs/symptoms of physical distress. Continue to progress workloads to maintain intensity without signs/symptoms of physical distress. Continue to progress workloads to maintain intensity without signs/symptoms of physical distress.   Average METs -- 2.44 2.74 2.85 2.85     Resistance Training   Training Prescription -- Yes Yes Yes Yes   Weight -- 2 lb 2 lb 2 lb 2 lb   Reps -- 10-15 10-15 10-15 10-15     Interval Training   Interval Training -- No No No No     Treadmill   MPH -- 2 2.5 2.4 2.4   Grade -- 0 0 0 0   Minutes -- 15 15 15 15    METs -- 2.53 2.91 2.84 2.84     Recumbant Bike    Level -- 2 -- -- --   Watts -- 19 -- -- --   Minutes -- 15 -- -- --     NuStep   Level -- 2 1 1 1    Minutes -- 15 15 15 15    METs -- 3 2.8 2.8 2.8     Biostep-RELP   Level -- 1 2 2 2    Minutes -- 15 15 15 15    METs -- 2 -- 3 3     Home Exercise Plan   Plans to continue exercise at -- -- -- -- Home (comment)  walking and TM   Frequency -- -- -- -- Add 2 additional days to program exercise sessions.   Initial Home Exercises Provided -- -- -- -- 07/25/23     Oxygen   Maintain Oxygen Saturation -- 88% or higher 88% or higher 88% or higher 88% or higher    Row Name 07/26/23 1400 08/09/23 1100 08/23/23 0700         Response to Exercise  Blood Pressure (Admit) 136/68 158/64 122/62     Blood Pressure (Exercise) 126/68 -- --     Blood Pressure (Exit) 136/70 112/62 112/58     Heart Rate (Admit) 61 bpm 67 bpm 72 bpm     Heart Rate (Exercise) 98 bpm 102 bpm 90 bpm     Heart Rate (Exit) 68 bpm 78 bpm 81 bpm     Oxygen Saturation (Admit) 95 % 96 % 97 %     Oxygen Saturation (Exercise) 95 % 87 % 94 %     Oxygen Saturation (Exit) 96 % 96 % 96 %     Rating of Perceived Exertion (Exercise) 13 13 14      Symptoms none none none     Duration Continue with 30 min of aerobic exercise without signs/symptoms of physical distress. Continue with 30 min of aerobic exercise without signs/symptoms of physical distress. Continue with 30 min of aerobic exercise without signs/symptoms of physical distress.     Intensity THRR unchanged THRR unchanged THRR unchanged       Progression   Progression Continue to progress workloads to maintain intensity without signs/symptoms of physical distress. Continue to progress workloads to maintain intensity without signs/symptoms of physical distress. Continue to progress workloads to maintain intensity without signs/symptoms of physical distress.     Average METs 2.83 2.75 2.86       Resistance Training   Training Prescription Yes Yes Yes     Weight 2 lb 2 lb 2 lb      Reps 10-15 10-15 10-15       Interval Training   Interval Training No No No       Treadmill   MPH 2.5 2.5 2.4     Grade 0 0 0     Minutes 15 15 15      METs 2.91 2.91 2.84       Recumbant Bike   Level -- 3 3     Watts -- 25 25     Minutes -- 15 15     METs -- 3.22 3.22       NuStep   Level 2 3 3      Minutes 15 15 15      METs 2.7 3.1 3.1       T5 Nustep   Level 2 -- --     SPM 80 -- --     Minutes 15 -- --     METs 2.2 -- --       Biostep-RELP   Level 2 1 3      Minutes 15 15 15      METs 3.22 2 3        Home Exercise Plan   Plans to continue exercise at Home (comment)  walking and TM Home (comment)  walking and TM Home (comment)  walking and TM     Frequency Add 2 additional days to program exercise sessions. Add 2 additional days to program exercise sessions. Add 2 additional days to program exercise sessions.     Initial Home Exercises Provided 07/25/23 07/25/23 07/25/23       Oxygen   Maintain Oxygen Saturation 88% or higher 88% or higher 88% or higher        Exercise Comments:   Exercise Comments     Row Name 05/30/23 0945 08/29/23 9071         Exercise Comments First full day of exercise!  Patient was oriented to gym and equipment including functions, settings, policies, and procedures.  Patient's individual exercise prescription and treatment plan were reviewed.  All starting workloads were established based on the results of the 6 minute walk test done at initial orientation visit.  The plan for exercise progression was also introduced and progression will be customized based on patient's performance and goals. Amy Lin graduated today from  rehab with 36 sessions completed.  Details of the patient's exercise prescription and what She needs to do in order to continue the prescription and progress were discussed with patient.  Patient was given a copy of prescription and goals.  Patient verbalized understanding. Amy Lin plans to continue to exercise by walking and  using his treadmill.         Exercise Goals and Review:   Exercise Goals     Row Name 05/22/23 1526             Exercise Goals   Increase Physical Activity Yes       Intervention Provide advice, education, support and counseling about physical activity/exercise needs.;Develop an individualized exercise prescription for aerobic and resistive training based on initial evaluation findings, risk stratification, comorbidities and participant's personal goals.       Expected Outcomes Short Term: Attend rehab on a regular basis to increase amount of physical activity.;Long Term: Add in home exercise to make exercise part of routine and to increase amount of physical activity.;Long Term: Exercising regularly at least 3-5 days a week.       Increase Strength and Stamina Yes       Intervention Provide advice, education, support and counseling about physical activity/exercise needs.;Develop an individualized exercise prescription for aerobic and resistive training based on initial evaluation findings, risk stratification, comorbidities and participant's personal goals.       Expected Outcomes Short Term: Increase workloads from initial exercise prescription for resistance, speed, and METs.;Short Term: Perform resistance training exercises routinely during rehab and add in resistance training at home;Long Term: Improve cardiorespiratory fitness, muscular endurance and strength as measured by increased METs and functional capacity ( )       Able to understand and use rate of perceived exertion (RPE) scale Yes       Intervention Provide education and explanation on how to use RPE scale       Expected Outcomes Short Term: Able to use RPE daily in rehab to express subjective intensity level;Long Term:  Able to use RPE to guide intensity level when exercising independently       Able to understand and use Dyspnea scale Yes       Intervention Provide education and explanation on how to use Dyspnea scale        Expected Outcomes Short Term: Able to use Dyspnea scale daily in rehab to express subjective sense of shortness of breath during exertion;Long Term: Able to use Dyspnea scale to guide intensity level when exercising independently       Knowledge and understanding of Target Heart Rate Range (THRR) Yes       Intervention Provide education and explanation of THRR including how the numbers were predicted and where they are located for reference       Expected Outcomes Short Term: Able to state/look up THRR;Short Term: Able to use daily as guideline for intensity in rehab;Long Term: Able to use THRR to govern intensity when exercising independently       Able to check pulse independently Yes       Intervention Provide education and demonstration on how to check pulse in carotid and radial arteries.;Review  the importance of being able to check your own pulse for safety during independent exercise       Expected Outcomes Short Term: Able to explain why pulse checking is important during independent exercise;Long Term: Able to check pulse independently and accurately       Understanding of Exercise Prescription Yes       Intervention Provide education, explanation, and written materials on patient's individual exercise prescription       Expected Outcomes Short Term: Able to explain program exercise prescription;Long Term: Able to explain home exercise prescription to exercise independently          Exercise Goals Re-Evaluation :  Exercise Goals Re-Evaluation     Row Name 05/30/23 0945 06/12/23 1612 06/27/23 0935 06/29/23 1731 07/14/23 0744     Exercise Goal Re-Evaluation   Exercise Goals Review Understanding of Exercise Prescription;Knowledge and understanding of Target Heart Rate Range (THRR);Able to understand and use Dyspnea scale;Able to understand and use rate of perceived exertion (RPE) scale Increase Physical Activity;Increase Strength and Stamina;Understanding of Exercise Prescription  Increase Physical Activity;Increase Strength and Stamina;Understanding of Exercise Prescription Increase Physical Activity;Increase Strength and Stamina;Understanding of Exercise Prescription Increase Physical Activity;Increase Strength and Stamina;Understanding of Exercise Prescription   Comments Reviewed RPE and dyspnea scale, THR and program prescription with pt today. Pt voiced understanding and was given a copy of goals to take home. Amy Lin is off to a good start in rehab. She did well on the treadmill at a speed of 2 mph with no incline. She also did well at level 2 on the recumbent bike and T4 nustep and level 1 on the biostep. We will continue to monitor her progress in the program. Amy Lin is doing well with exercise at rehab. She as handweights and a treadmill at home. Says she used to use them consistently but fell out of routine. Set goal to continue to attend rehab and look to add more home exercise. Amy Lin is doing well in rehab. She increased her speed of the treadmill to 2.5 mph with no incline. She also increased to level 2 on the biostep. She decreased back to level 1 on the T4 nustep, but increased her METs to 2.8. We will continue to monitor her progress in the program. Amy Lin continues to do well in rehab. She continues to walk on the treadmill at 2.4 mph with no incline. She also continues to work at level 2 on the biostep and continues to work at a decreased level of 1 on the T4 nustep. We will continue to monitor her progress in the program.   Expected Outcomes Short: Use RPE daily to regulate intensity.  Long: Follow program prescription in THR. Short: Continue to follow current exercise prescription. Long: Continue exercise to improve strength and stamina. STG: continue to attend rehab and look to add more home exercise. LTG: Continue exercise to improve strength and stamina. Short: Try to get back to level 2 on the T4 nustep. Long: Continue exercise to improve strength and stamina. Short:  Increase back up to level 2 on the T4 nustep. Long: Continue exercise to improve strength and stamina.    Row Name 07/25/23 9071 07/26/23 1414 08/03/23 0947 08/09/23 1117 08/23/23 0738     Exercise Goal Re-Evaluation   Exercise Goals Review Increase Physical Activity;Able to understand and use rate of perceived exertion (RPE) scale;Knowledge and understanding of Target Heart Rate Range (THRR);Understanding of Exercise Prescription;Increase Strength and Stamina;Able to understand and use Dyspnea scale;Able to check pulse independently Increase  Physical Activity;Understanding of Exercise Prescription;Increase Strength and Stamina Increase Physical Activity;Increase Strength and Stamina;Understanding of Exercise Prescription Increase Physical Activity;Increase Strength and Stamina;Understanding of Exercise Prescription Increase Physical Activity;Increase Strength and Stamina;Understanding of Exercise Prescription   Comments Reviewed home exercise with pt today.  Pt plans to walk and use TM at home for exercise.  Reviewed THR, pulse, RPE, sign and symptoms, pulse oximetery and when to call 911 or MD.  Also discussed weather considerations and indoor options.  Pt voiced understanding. Amy Lin continues to do well in rehab. She was able to increase her workload on the treadmill to a speed of 2.5 mph and no incline. She also increased to level 2 on the T4 nustep. She maintained level 2 on the biostep. We will continue to monitor her progress in the program. Amy Lin is doing very well here at rehab. She is also exercising some at home, but admits it has not been as consisitent as she would like Amy Lin continues to do well in rehab. She has been able to maintain her treadmill workload at a speed of 2.5 and no incline. She was also able to increase from level 2 to 3 on the T4 nuste, and use the recumbent bike at level 3. We will continue to monitor her progress in the program. Amy Lin is doing well in rehab. She recently  completed her post-6MWT and was able to improve by 136ft. She was also able to increase her level on the biostep from 1 to 3. We will continue to monitor her progress in the program.   Expected Outcomes Short: add 1-2 days a week of exercise at home on off days of rehab. Long: maintain independent exercise routine upon graduation from cardiac rehab. Short: Continue to progressively increase treadmill and nustep workloads. Long: Continue exercise to improve strength and stamina. STG: Continue to attended rehab and exercise at home as able. LTG: Continue exercise to improve strength and stamina. Short: Improve on post-6MWT. Long: Conitnue exercise to improve strength and stamina. Short: Graduate. Long: Continue exercise to improve strength and stamina.      Discharge Exercise Prescription (Final Exercise Prescription Changes):  Exercise Prescription Changes - 08/23/23 0700       Response to Exercise   Blood Pressure (Admit) 122/62    Blood Pressure (Exit) 112/58    Heart Rate (Admit) 72 bpm    Heart Rate (Exercise) 90 bpm    Heart Rate (Exit) 81 bpm    Oxygen Saturation (Admit) 97 %    Oxygen Saturation (Exercise) 94 %    Oxygen Saturation (Exit) 96 %    Rating of Perceived Exertion (Exercise) 14    Symptoms none    Duration Continue with 30 min of aerobic exercise without signs/symptoms of physical distress.    Intensity THRR unchanged      Progression   Progression Continue to progress workloads to maintain intensity without signs/symptoms of physical distress.    Average METs 2.86      Resistance Training   Training Prescription Yes    Weight 2 lb    Reps 10-15      Interval Training   Interval Training No      Treadmill   MPH 2.4    Grade 0    Minutes 15    METs 2.84      Recumbant Bike   Level 3    Watts 25    Minutes 15    METs 3.22      NuStep   Level  3    Minutes 15    METs 3.1      Biostep-RELP   Level 3    Minutes 15    METs 3      Home Exercise Plan    Plans to continue exercise at Home (comment)   walking and TM   Frequency Add 2 additional days to program exercise sessions.    Initial Home Exercises Provided 07/25/23      Oxygen   Maintain Oxygen Saturation 88% or higher          Nutrition:  Target Goals: Understanding of nutrition guidelines, daily intake of sodium 1500mg , cholesterol 200mg , calories 30% from fat and 7% or less from saturated fats, daily to have 5 or more servings of fruits and vegetables.  Education: All About Nutrition: -Group instruction provided by verbal, written material, interactive activities, discussions, models, and posters to present general guidelines for heart healthy nutrition including fat, fiber, MyPlate, the role of sodium in heart healthy nutrition, utilization of the nutrition label, and utilization of this knowledge for meal planning. Follow up email sent as well. Written material given at graduation. Flowsheet Row Cardiac Rehab from 08/10/2023 in [facility]  Date 07/13/23  Educator JG part 2  Instruction Review Code 1- Verbalizes Understanding    Biometrics:  Pre Biometrics - 05/22/23 1527       Pre Biometrics   Height 5' 5 (1.651 m)    Weight 142 lb 3.2 oz (64.5 kg)    Waist Circumference 34.5 inches    Hip Circumference 38.5 inches    Waist to Hip Ratio 0.9 %    BMI (Calculated) 23.66    Single Leg Stand 25.2 seconds          Post Biometrics - 08/15/23 0950        Post  Biometrics   Height 5' 5 (1.651 m)    Weight 142 lb 1.6 oz (64.5 kg)    Waist Circumference 37 inches    Hip Circumference 39 inches    Waist to Hip Ratio 0.95 %    BMI (Calculated) 23.65    Single Leg Stand 23 seconds          Nutrition Therapy Plan and Nutrition Goals:  Nutrition Therapy & Goals - 05/22/23 1428       Nutrition Therapy   Diet Cardiac, Low Na    Protein (specify units) 65    Fiber 25 grams    Whole Grain Foods 3 servings    Saturated Fats 15 max.  grams    Fruits and Vegetables 5 servings/day    Sodium 2 grams      Personal Nutrition Goals   Nutrition Goal Eat 15-30gProtein and 30-60gCarbs at each meal.    Personal Goal #2 Drink 48oz of water daily    Personal Goal #3 Include more veggies and/or fruit    Comments Patient drinking ~32oz of water daily. She feels she could do better, set goal to aim for ~48oz. She eats 3 times per day, he meals are often balanced with protein and carbs portioned well. She likes veggies and eats them daily, though she would like to include more in her diet. Brainstormed some meals ideas to help her include more colorful produce. She does like sweets and eats them after her dinner often. Says its a habit, and will work on improving. She doesn't salt her foods often, but does likes some salty snacks. Encouraged her to look for lower sodium  options and try to do less salt. Overall, she is doing very well, eating enough to support her body's needs. Will continue to monitor and support.      Intervention Plan   Intervention Prescribe, educate and counsel regarding individualized specific dietary modifications aiming towards targeted core components such as weight, hypertension, lipid management, diabetes, heart failure and other comorbidities.;Nutrition handout(s) given to patient.    Expected Outcomes Short Term Goal: Understand basic principles of dietary content, such as calories, fat, sodium, cholesterol and nutrients.;Short Term Goal: A plan has been developed with personal nutrition goals set during dietitian appointment.;Long Term Goal: Adherence to prescribed nutrition plan.          Nutrition Assessments:  MEDIFICTS Score Key: >=70 Need to make dietary changes  40-70 Heart Healthy Diet <= 40 Therapeutic Level Cholesterol Diet  Flowsheet Row Cardiac Rehab from 08/22/2023 in [facility]  Picture Your Plate Total Score on Admission 57  Picture Your Plate Total Score on Discharge 62    Picture Your Plate Scores: <59 Unhealthy dietary pattern with much room for improvement. 41-50 Dietary pattern unlikely to meet recommendations for good health and room for improvement. 51-60 More healthful dietary pattern, with some room for improvement.  >60 Healthy dietary pattern, although there may be some specific behaviors that could be improved.    Nutrition Goals Re-Evaluation:  Nutrition Goals Re-Evaluation     Row Name 06/27/23 [phone] 07/11/23 0940 08/03/23 0950         Goals   Comment Spoke with Rock about making sure she eats a protein at most of her meals. She likes fruit and includes several servings per day. She is monitoring her salt intake. Encouraged her to continue to read labels and build balanced meals Amy Lin states that she is continuing to work on her diet. She is trying to add more vegetables to her diet and has been eliminating excess sodium. She states that she is drinking lots of water while also drinking coffee every morning and the occasional tea. Amy Lin reports she is drinking 40-60oz of water most days. She is eating well. Reminded her to include protien at most of her meals to help her stay strong and retain muscle mass     Expected Outcome STG: continue to read labels and build balanced meals. LTG: Follow a heart healthy lifestyle Short: Continue to add more vegetables to diet. Long: Continue to practice heart healthy eating patterns discussed with RD. STG: Continue to eat 3 times per day, eat protein at meals. LTG: Continue to practice heart healthy eating patterns discussed with RD.        Nutrition Goals Discharge (Final Nutrition Goals Re-Evaluation):  Nutrition Goals Re-Evaluation - 08/03/23 0950       Goals   Comment Amy Lin reports she is drinking 40-60oz of water most days. She is eating well. Reminded her to include protien at most of her meals to help her stay strong and retain muscle mass    Expected Outcome STG: Continue to eat 3 times per day,  eat protein at meals. LTG: Continue to practice heart healthy eating patterns discussed with RD.          Psychosocial: Target Goals: Acknowledge presence or absence of significant depression and/or stress, maximize coping skills, provide positive support system. Participant is able to verbalize types and ability to use techniques and skills needed for reducing stress and depression.   Education: Stress, Anxiety, and Depression - Group verbal and visual presentation to  define topics covered.  Reviews how body is impacted by stress, anxiety, and depression.  Also discusses healthy ways to reduce stress and to treat/manage anxiety and depression.  Written material given at graduation. Flowsheet Row Cardiac Rehab from 08/10/2023 in [facility] Card[facility] and [facility] Re[facility]  Education need identified 05/22/23  Date 06/08/23  Educator [facility]  Instruction Review Code 1- [employer] Understanding    Education: Sleep Hygiene -Provides group verbal and written instruction about how sleep can affect your health.  Define sleep hygiene, discuss sleep cycles and impact of sleep habits. Review good sleep hygiene tips.    Initial Review & Psychosocial Screening:  Initial Psych Review & Screening - 05/10/23 1344       Initial Review   Current issues with None Identified      Family Dynamics   Good Support System? Yes    Comments Amy Lin can look to her son, daughter, friends and church for support. She takes no medications for her mood and has done Cardiac rehab before and knows what to expect.      Barriers   Psychosocial barriers to participate in program There are no identifiable barriers or psychosocial needs.;The patient should benefit from training in stress management and relaxation.      Screening Interventions   Interventions Encouraged to exercise;Provide feedback about the scores to participant;To provide support and resources with identified psychosocial needs    Expected Outcomes Long Term Goal:  Stressors or current issues are controlled or eliminated.;Short Term goal: Utilizing psychosocial counselor, staff and physician to assist with identification of specific Stressors or current issues interfering with healing process. Setting desired goal for each stressor or current issue identified.;Short Term goal: Identification and review with participant of any Quality of Life or Depression concerns found by scoring the questionnaire.;Long Term goal: The participant improves quality of Life and PHQ9 Scores as seen by post scores and/or verbalization of changes          Quality of Life Scores:   Quality of Life - 08/22/23 1011       Quality of Life Scores   Health/Function Pre 22.1 %    Health/Function Post 22.9 %    Health/Function % Change 3.62 %    Socioeconomic Pre 25 %    Socioeconomic Post 26.06 %    Socioeconomic % Change  4.24 %    Psych/Spiritual Pre 26.21 %    Psych/Spiritual Post 26.14 %    Psych/Spiritual % Change -0.27 %    Family Pre 25.8 %    Family Post 21.5 %    Family % Change -16.67 %    GLOBAL Pre 24.11 %    GLOBAL Post 24.07 %    GLOBAL % Change -0.17 %         Scores of 19 and below usually indicate a poorer quality of life in these areas.  A difference of  2-3 points is a clinically meaningful difference.  A difference of 2-3 points in the total score of the Quality of Life Index has been associated with significant improvement in overall quality of life, self-image, physical symptoms, and general health in studies assessing change in quality of life.  PHQ-9: Review Flowsheet  More data exists      08/22/2023 06/01/2023 05/22/2023 03/20/2023 12/26/2022  Depression screen PHQ 2/9  Decreased Interest 0 0 0 0 0  Down, Depressed, Hopeless 0 0 0 0 0  PHQ - 2 Score 0 0 0 0 0  Altered sleeping 1 1 1  - -  Tired, decreased energy 2 1 2  - -  Change in appetite 1 0 0 - -  Feeling bad or failure about yourself  1 0 0 - -  Trouble concentrating 0 0 0 - -  Moving  slowly or fidgety/restless 0 0 0 - -  Suicidal thoughts 0 0 0 - -  PHQ-9 Score 5 2 3  - -  Difficult doing work/chores Somewhat difficult Not difficult at all Somewhat difficult - -   Interpretation of Total Score  Total Score Depression Severity:  1-4 = Minimal depression, 5-9 = Mild depression, 10-14 = Moderate depression, 15-19 = Moderately severe depression, 20-27 = Severe depression   Psychosocial Evaluation and Intervention:  Psychosocial Evaluation - 05/10/23 1346       Psychosocial Evaluation & Interventions   Interventions Encouraged to exercise with the program and follow exercise prescription;Relaxation education;Stress management education    Comments Amy Lin can look to her son, daughter, friends and church for support. She takes no medications for her mood and has done Cardiac rehab before and knows what to expect.    Expected Outcomes Short: Start Heart[facility] to help with mood. Long: Maintain a healthy mental state    Continue Psychosocial Services  Follow up required by staff          Psychosocial Re-Evaluation:  Psychosocial Re-Evaluation     Row Name 06/27/23 [phone] 07/11/23 0943 08/03/23 0948         Psychosocial Re-Evaluation   Current issues with None Identified None Identified None Identified     Comments Tamakia denies any stress, depression or anxiety at this time. She says she has been sleeping well. Amy Lin denies any major stressors at this time. She states that the news and current events can make her upset, but she enjoys going to eat with friends and watching the birds in her flowers. She states that she is sleeping well although she reports not going to sleep later than she should. She also enjoys being active in her church and helping with the concesssions at local recreational events. Amy Lin reports she is sleeping well most nights, but does find that she sleeps better in her chair than in her bed. She denies any stress, anxiety or depression.     Expected  Outcomes STG: Continue to attend rehab and focus on good sleep. LTG: Achieve and maintain positive outlook on health and daily life Short: Continue to relieve stress through healthy avenues. Long: Maintain positive outlook. STG: Continue to relieve stress through healthy avenues. LTG: Maintain positive outlook.     Interventions Encouraged to attend Cardiac Rehabilitation for the exercise Encouraged to attend Cardiac Rehabilitation for the exercise Encouraged to attend Cardiac Rehabilitation for the exercise     Continue Psychosocial Services  Follow up required by staff Follow up required by staff Follow up required by staff        Psychosocial Discharge (Final Psychosocial Re-Evaluation):  Psychosocial Re-Evaluation - 08/03/23 0948       Psychosocial Re-Evaluation   Current issues with None Identified    Comments Amy Lin reports she is sleeping well most nights, but does find that she sleeps better in her chair than in her bed. She denies any stress, anxiety or depression.    Expected Outcomes STG: Continue to relieve stress through healthy avenues. LTG: Maintain positive outlook.    Interventions Encouraged to attend Cardiac Rehabilitation for the exercise    Continue Psychosocial Services  Follow up required by staff  Vocational Rehabilitation: Provide vocational rehab assistance to qualifying candidates.   Vocational Rehab Evaluation & Intervention:   Education: Education Goals: Education classes will be provided on a variety of topics geared toward better understanding of heart health and risk factor modification. Participant will state understanding/return demonstration of topics presented as noted by education test scores.  Learning Barriers/Preferences:  Learning Barriers/Preferences - 05/10/23 1343       Learning Barriers/Preferences   Learning Barriers None    Learning Preferences None          General Cardiac Education Topics:  AED/CPR: - Group verbal  and written instruction with the use of models to demonstrate the basic use of the AED with the basic ABC's of resuscitation.   Anatomy and Cardiac Procedures: - Group verbal and visual presentation and models provide information about basic cardiac anatomy and function. Reviews the testing methods done to diagnose heart disease and the outcomes of the test results. Describes the treatment choices: Medical Management, Angioplasty, or Coronary Bypass Surgery for treating various heart conditions including Myocardial Infarction, Angina, Valve Disease, and Cardiac Arrhythmias.  Written material given at graduation. Flowsheet Row Cardiac Rehab from 08/10/2023 in [facility] Cardiac and Pulmonary Rehab  Date 07/20/23  Educator [facility]  Instruction Review Code 1- Verbalizes Understanding    Medication Safety: - Group verbal and visual instruction to review commonly prescribed medications for heart and lung disease. Reviews the medication, class of the drug, and side effects. Includes the steps to properly store meds and maintain the prescription regimen.  Written material given at graduation. Flowsheet Row Cardiac Rehab from 06/11/2020 in [facility] Cardiac and Pulmonary Rehab  Date 04/16/20  Educator SB  Instruction Review Code 1- Verbalizes Understanding    Intimacy: - Group verbal instruction through game format to discuss how heart and lung disease can affect sexual intimacy. Written material given at graduation.. Flowsheet Row Cardiac Rehab from 08/10/2023 in [facility] Cardiac and Pulmonary Rehab  Date 06/29/23  Educator MB  Instruction Review Code 1- Verbalizes Understanding    Know Your Numbers and Heart Failure: - Group verbal and visual instruction to discuss disease risk factors for cardiac and pulmonary disease and treatment options.  Reviews associated critical values for Overweight/Obesity, Hypertension, Cholesterol, and Diabetes.  Discusses basics of heart failure: signs/symptoms and treatments.   Introduces Heart Failure Zone chart for action plan for heart failure.  Written material given at graduation. Flowsheet Row Cardiac Rehab from 08/10/2023 in [facility] Cardiac and Pulmonary Rehab  Education need identified 05/22/23  Date 08/03/23  Educator SB  Instruction Review Code 1- Verbalizes Understanding    Infection Prevention: - Provides verbal and written material to individual with discussion of infection control including proper hand washing and proper equipment cleaning during exercise session. Flowsheet Row Cardiac Rehab from 08/10/2023 in [facility] Cardiac and Pulmonary Rehab  Date 05/10/23  Educator jh  Instruction Review Code 1- Verbalizes Understanding    Falls Prevention: - Provides verbal and written material to individual with discussion of falls prevention and safety. Flowsheet Row Cardiac Rehab from 08/10/2023 in [facility] Cardiac and Pulmonary Rehab  Date 05/10/23  Educator jh  Instruction Review Code 1- Verbalizes Understanding    Other: -Provides group and verbal instruction on various topics (see comments)   Knowledge Questionnaire Score:  Knowledge Questionnaire Score - 08/22/23 1011       Knowledge Questionnaire Score   Pre Score 24/26    Post Score 26/26          Core Components/Risk Factors/Patient Goals  at Admission:  Personal Goals and Risk Factors at Admission - 05/10/23 1343       Core Components/Risk Factors/Patient Goals on Admission    Weight Management Yes;Weight Maintenance;Weight Loss    Intervention Weight Management: Develop a combined nutrition and exercise program designed to reach desired caloric intake, while maintaining appropriate intake of nutrient and fiber, sodium and fats, and appropriate energy expenditure required for the weight goal.;Weight Management: Provide education and appropriate resources to help participant work on and attain dietary goals.;Weight Management/Obesity: Establish reasonable short term and long term weight goals.     Expected Outcomes Short Term: Continue to assess and modify interventions until short term weight is achieved;Long Term: Adherence to nutrition and physical activity/exercise program aimed toward attainment of established weight goal;Weight Loss: Understanding of general recommendations for a balanced deficit meal plan, which promotes 1-2 lb weight loss per week and includes a negative energy balance of [phone] kcal/d;Understanding recommendations for meals to include 15-35% energy as protein, 25-35% energy from fat, 35-60% energy from carbohydrates, less than 200mg  of dietary cholesterol, 20-35 gm of total fiber daily;Understanding of distribution of calorie intake throughout the day with the consumption of 4-5 meals/snacks;Weight Maintenance: Understanding of the daily nutrition guidelines, which includes 25-35% calories from fat, 7% or less cal from saturated fats, less than 200mg  cholesterol, less than 1.5gm of sodium, & 5 or more servings of fruits and vegetables daily    Hypertension Yes    Intervention Provide education on lifestyle modifcations including regular physical activity/exercise, weight management, moderate sodium restriction and increased consumption of fresh fruit, vegetables, and low fat dairy, alcohol moderation, and smoking cessation.;Monitor prescription use compliance.    Expected Outcomes Short Term: Continued assessment and intervention until BP is < 140/71mm HG in hypertensive participants. < 130/42mm HG in hypertensive participants with diabetes, heart failure or chronic kidney disease.;Long Term: Maintenance of blood pressure at goal levels.    Lipids Yes    Intervention Provide education and support for participant on nutrition & aerobic/resistive exercise along with prescribed medications to achieve LDL 70mg , HDL >40mg .    Expected Outcomes Short Term: Participant states understanding of desired cholesterol values and is compliant with medications prescribed. Participant is  following exercise prescription and nutrition guidelines.;Long Term: Cholesterol controlled with medications as prescribed, with individualized exercise RX and with personalized nutrition plan. Value goals: LDL < 70mg , HDL > 40 mg.          Education:Diabetes - Individual verbal and written instruction to review signs/symptoms of diabetes, desired ranges of glucose level fasting, after meals and with exercise. Acknowledge that pre and post exercise glucose checks will be done for 3 sessions at entry of program.   Core Components/Risk Factors/Patient Goals Review:   Goals and Risk Factor Review     Row Name 06/27/23 0940 07/11/23 0947 08/03/23 0951         Core Components/Risk Factors/Patient Goals Review   Personal Goals Review Hypertension Hypertension;Weight Management/Obesity Hypertension     Review Amy Lin checks her blood pressure at home, reports it often matches with the readings done here at rehab. She is aware of sodiums effect on BP and monitors her intake. Amy Lin states that she has lost weight but she believes it is mostly muscle mass. She would like to gain some muslce mass back without gaining any unhealthy weight. She has also been monitoring her BP at home multpile times each week. She states that her BP has stayed within normal ranges most of the time. She  knows to check her BP if she is feeling bad because it does get low at times. She continues to take all her medications as prescribed. Amy Lin is checking her BP at home, reports it is stable and matches closely with the readings taken here at rehab.     Expected Outcomes STG: Continue to check BP at home. LTG: Manage risk fastors independently Short: Continue to regularly check BP at home. Long: Continue to manage lifestyle risk factors. STG: Continue to regularly check BP at home. LTG: Continue to manage lifestyle risk factors.        Core Components/Risk Factors/Patient Goals at Discharge (Final Review):   Goals and Risk  Factor Review - 08/03/23 0951       Core Components/Risk Factors/Patient Goals Review   Personal Goals Review Hypertension    Review Amy Lin is checking her BP at home, reports it is stable and matches closely with the readings taken here at rehab.    Expected Outcomes STG: Continue to regularly check BP at home. LTG: Continue to manage lifestyle risk factors.          ITP Comments:  ITP Comments     Row Name 05/10/23 1342 05/22/23 1520 05/30/23 0945 06/07/23 1338 07/05/23 1017   ITP Comments Virtual Visit completed. Patient informed on EP and RD appointment and 6 Minute walk test. Patient also informed of patient health questionnaires on My Chart. Patient Verbalizes understanding. Visit diagnosis can be found in [facility] 04/21/2023. Completed and gym orientation for cardiac rehab. Initial ITP created and sent for review to Dr. Oneil Lin, Medical Director. First full day of exercise!  Patient was oriented to gym and equipment including functions, settings, policies, and procedures.  Patient's individual exercise prescription and treatment plan were reviewed.  All starting workloads were established based on the results of the 6 minute walk test done at initial orientation visit.  The plan for exercise progression was also introduced and progression will be customized based on patient's performance and goals. 30 Day review completed. Medical Director ITP review done, changes made as directed, and signed approval by Medical Director.    new to program 30 Day review completed. Medical Director ITP review done, changes made as directed, and signed approval by Medical Director.    Row Name 08/02/23 1213 08/29/23 0928         ITP Comments 30 Day review completed. Medical Director ITP review done, changes made as directed, and signed approval by Medical Director. Saraiyah graduated today from  rehab with 36 sessions completed.  Details of the patient's exercise prescription and what She needs to do in  order to continue the prescription and progress were discussed with patient.  Patient was given a copy of prescription and goals.  Patient verbalized understanding. Aoki plans to continue to exercise by walking and using his treadmill.         Comments: discharge ITP

## 2023-08-29 NOTE — Progress Notes (Signed)
 Daily Session Note  Patient Details  Name: Amy Lin MRN: [id number] Date of Birth: 09-Mar-1933 Referring Provider:   Flowsheet Row Cardiac Rehab from 05/22/2023 in [facility] Cardiac and Pulmonary Rehab  Referring Provider Dr. Newman Lawrence, MD    Encounter Date: 08/29/2023  Check In:  Session Check In - 08/29/23 0918       Check-In   Supervising physician immediately available to respond to emergencies See telemetry face sheet for immediately available ER MD    Location [facility]-Cardiac & Pulmonary Rehab    Staff Present Amy Davenport RN,BSN,MPA;Amy Lin BS, Exercise Physiologist;Amy Best, MS, Exercise Physiologist;Amy Lin RDN,LDN;Amy Lin, BS, Exercise Physiologist    Virtual Visit No    Medication changes reported     No    Fall or balance concerns reported    No    Warm-up and Cool-down Performed on first and last piece of equipment    Resistance Training Performed Yes    VAD Patient? No    PAD/SET Patient? No      Pain Assessment   Currently in Pain? No/denies             Social History   Tobacco Use  Smoking Status Never  Smokeless Tobacco Never    Goals Met:  Independence with exercise equipment Exercise tolerated well No report of concerns or symptoms today Strength training completed today  Goals Unmet:  Not Applicable  Comments:  Amy Lin graduated today from  rehab with 36 sessions completed.  Details of the patient's exercise prescription and what She needs to do in order to continue the prescription and progress were discussed with patient.  Patient was given a copy of prescription and goals.  Patient verbalized understanding. Amy Lin plans to continue to exercise by walking and using his treadmill.     Dr. Oneil Lin is Medical Director for [facility] Cardiac Rehabilitation.  Dr. Fuad Lin is Medical Director for [facility] Pulmonary Rehabilitation.

## 2023-08-30 ENCOUNTER — Ambulatory Visit: Payer: Self-pay | Admitting: Nurse Practitioner

## 2023-08-31 ENCOUNTER — Encounter

## 2023-09-01 ENCOUNTER — Telehealth: Payer: Self-pay | Admitting: Cardiology

## 2023-09-01 DIAGNOSIS — R001 Bradycardia, unspecified: Secondary | ICD-10-CM | POA: Diagnosis not present

## 2023-09-01 DIAGNOSIS — I455 Other specified heart block: Secondary | ICD-10-CM

## 2023-09-01 DIAGNOSIS — I471 Supraventricular tachycardia, unspecified: Secondary | ICD-10-CM

## 2023-09-01 NOTE — Telephone Encounter (Signed)
 Reviewed monitor tracings. She has had pauses up to 3.5 secs, but also episodes of SVT. If true symptomatic bradycardia of any kind in 30s, may need pacemaker. In the meantime, if any acute event like syncope, needs to go to ER. If not, needs urgent outpatient EP eval.  Thanks MJP

## 2023-09-01 NOTE — Telephone Encounter (Signed)
 Outpatient service line:  I rhythm reporting prior heart monitor results between July 20 to July 27.  These results are already posted in the chart but she did have multiple pauses noted throughout the duration of her heart monitor.  Had up to 3.5-second pause, timeframe seems to be mostly nocturnal.  Reportedly did have triggered event around 1240 on 1 afternoon with heart rate of 37 that was sustained for about 30 seconds.  Patient had reported only slight dizziness further records.  Attempted to call patient but no answer.  Sending to Dr. Elmira for further evaluation.

## 2023-09-04 NOTE — Addendum Note (Signed)
 Addended by: MANDA BOTTCHER B on: 09/04/2023 03:14 PM   Modules accepted: Orders

## 2023-09-04 NOTE — Telephone Encounter (Signed)
 Noted.  Thanks MJP

## 2023-09-04 NOTE — Telephone Encounter (Signed)
 Patient returned Amy Lin's call.

## 2023-09-04 NOTE — Telephone Encounter (Signed)
 fyi

## 2023-09-04 NOTE — Telephone Encounter (Signed)
 Rea Spikes called patient to schedule urgent EP referral for sinus pause, bradycardia and SVT's seen on heart monitor + for consideration for pacemaker.

## 2023-09-04 NOTE — Telephone Encounter (Signed)
 Pt contacted and advised. Pt agrees with plan of care. Urgent EP referral placed.

## 2023-09-04 NOTE — Telephone Encounter (Signed)
 Spoke w/ patient - she is scheduled to see Dr. Inocencio on 8/19 for EP consultation!

## 2023-09-05 ENCOUNTER — Encounter

## 2023-09-05 DIAGNOSIS — R001 Bradycardia, unspecified: Secondary | ICD-10-CM

## 2023-09-06 ENCOUNTER — Ambulatory Visit: Payer: Self-pay | Admitting: Physician Assistant

## 2023-09-07 ENCOUNTER — Encounter

## 2023-09-08 ENCOUNTER — Encounter: Payer: Self-pay | Admitting: Cardiology

## 2023-09-08 ENCOUNTER — Ambulatory Visit: Attending: Cardiology | Admitting: Cardiology

## 2023-09-08 VITALS — BP 126/64 | HR 71 | Ht 64.0 in | Wt 141.0 lb

## 2023-09-08 DIAGNOSIS — I25118 Atherosclerotic heart disease of native coronary artery with other forms of angina pectoris: Secondary | ICD-10-CM | POA: Diagnosis not present

## 2023-09-08 DIAGNOSIS — I10 Essential (primary) hypertension: Secondary | ICD-10-CM

## 2023-09-08 DIAGNOSIS — I350 Nonrheumatic aortic (valve) stenosis: Secondary | ICD-10-CM

## 2023-09-08 DIAGNOSIS — I455 Other specified heart block: Secondary | ICD-10-CM | POA: Diagnosis not present

## 2023-09-08 NOTE — Progress Notes (Signed)
 Cardiology Office Note:  .   Date:  09/08/2023  ID:  Amy Lin, DOB 02-10-1933, MRN [id number] PCP: Amy Lynwood HERO, NP  [facility] Providers Cardiologist:  Amy Lawrence, MD PCP: Amy Lynwood HERO, NP  Chief Complaint  Patient presents with   Dizziness      History of Present Illness: .    Amy Lin is a 89 y.o. female with hypertension, hyperlipidemia, CAD, aortic stenosis  Patient underwent PCI to ostial ramus due to symptoms of unstable angina and mild troponin elevation (04/2023)  Patient is doing well since PCI, not having any chest pain.  Few weeks ago, patient had an episode of lightheadedness during her cardiac rehab session.  She was sent to emergency room.  There was no acute abnormality noted including no significant bradycardia arrhythmias.  She was placed on 2-week monitor which showed nocturnal asymptomatic sinus pauses up to 3.5 seconds, and brief episodes of paroxysmal atrial tachycardia.  She has had only occasional episodes of dizziness during the daytime since then.   Vitals:   09/08/23 0801  BP: 126/64  Pulse: 71  SpO2: 98%      ROS:  Review of Systems  Cardiovascular:  Negative for chest pain, dyspnea on exertion, leg swelling, palpitations and syncope.  Neurological:  Positive for light-headedness.     Studies Reviewed: SABRA       EKG 05/04/2023: Sinus rhythm with marked sinus arrhythmia When compared with ECG of 21-Apr-2023 14:36, No significant change was found    Zio patch monitor 7 days 08/20/2023 - 08/27/2023: Dominant rhythm: Sinus. HR 40-95 bpm. Avg HR 61 bpm, in sinus rhythm. 17 Pauses occurred, the longest lasting 3.5 secs (17 bpm), all during sleep hours without any reported symptoms.. 34 episodes of atrial tachycardia, fastest at 188 bpm for 7 beats, longest for 16 beats at 122 bpm. <1% isolated SVE, couplet/triplets. 0 episodes of VT. <1% isolated VE, couplet/triplets. No atrial fibrillation/atrial flutter/VT/high grade  AV block, sinus pause >3sec noted. 4 patient triggered events, correlated with sinus rhythm, SVE.      Coronary angiography and intervention 04/21/2023: LM: Normal LAD: No significant disease Lcx: Prox mild 30% disease (Unchanged since 2022) Ramus: Patent prox stent, no significant stenosis, following a 95% stenosis RCA: Large dominant vessel, patent stents with no significant disease           Rt PDA 30% disease   LVEDP 13 mmHg   Successful percutaneous coronary intervention ramus        OCT guided PTCA and stent placement 2.5 X 8 mm Onyx drug-eluting stent        Post dilatation using 2.5 X 6 mm [place] balloon up to 18 atm     Labs 06/2023: Chol 122, TG 137, HDL 52, LDL 42 HbA1C 6.7% Hb 12 Cr 0.9 TSH 5.5  Echocardiogram 03/02/2023: 1. Left ventricular ejection fraction, by estimation, is 60 to 65%. The  left ventricle has normal function. The left ventricle has no regional  wall motion abnormalities. There is moderate concentric left ventricular  hypertrophy. Left ventricular  diastolic parameters are consistent with Grade II diastolic dysfunction  (pseudonormalization). Elevated left atrial pressure.   2. Right ventricular systolic function is normal. The right ventricular  size is mildly enlarged. There is mildly elevated pulmonary artery  systolic pressure. The estimated right ventricular systolic pressure is  40.9 mmHg.   3. Left atrial size was moderately dilated.   4. The mitral valve is grossly normal. Mild mitral valve  regurgitation.  No evidence of mitral stenosis. Moderate mitral annular calcification.   5. The aortic valve is severely calcified with restricted excursion of  valve leaflets. On limited assessment, mean transvalvular gradient  ~32mmHg. There is no aortic regurgitation.   6. Aortic dilatation noted. There is borderline dilatation of the  ascending aorta, measuring 37 mm.   7. The inferior vena cava is normal in size with greater than 50%  respiratory  variability, suggesting right atrial pressure of 3 mmHg.     Physical Exam:   Physical Exam Vitals and nursing note reviewed.  Constitutional:      General: She is not in acute distress. Neck:     Vascular: No JVD.  Cardiovascular:     Rate and Rhythm: Normal rate and regular rhythm.     Heart sounds: Normal heart sounds. No murmur heard. Pulmonary:     Effort: Pulmonary effort is normal.     Breath sounds: Normal breath sounds. No wheezing or rales.  Musculoskeletal:     Right lower leg: No edema.     Left lower leg: No edema.      VISIT DIAGNOSES:   ICD-10-CM   1. Sinus pause  I45.5 Home sleep test    2. Coronary artery disease of native artery of native heart with stable angina pectoris (HCC)  I25.118     3. Essential hypertension  I10     4. Nonrheumatic aortic valve stenosis  I35.0          ASSESSMENT AND PLAN: .    Amy Lin is a 89 y.o. female with hypertension, hyperlipidemia, CAD, aortic stenosis   Sinus pause/paroxysmal atrial tachycardia: Nocturnal asymptomatic sinus pauses.  Patient lives alone and does not know if she snores.  Regardless, I think nocturnal sinus pauses could be related to obstructive sleep apnea.  Recommend sleep study.  It is possible that her daytime dizziness episodes were related to paroxysmal atrial tachycardia.  These episodes are short.  I would not start a beta-blocker given her nighttime pauses until she is on CPAP for possible sleep apnea. Patient has an appointment to see electrophysiologist Dr. Inocencio on 09/19/2023.  Okay to keep this appointment.  CAD: S/p PCI to ostial ramus for unstable angina with resolution of chest pain symptoms (04/2023). Patent proximal ramus, proximal/mid RCA stents with rest mild disease. Recommend aspirin  and Plavix  at least in 03/2024 given overlapping stents.  Following that, would consider stopping aspirin  and continuing Plavix  monotherapy. Continue rosuvastatin  20 mg daily, lipids very  well-controlled.  Hypertension: Very well-controlled.  Aortic stenosis: Severe aortic calcification with mild aortic stenosis on echocardiogram 02/2023. Repeat echocardiogram in 03/2024.   F/u in 6 months  Signed, Amy JINNY Lawrence, MD

## 2023-09-08 NOTE — Patient Instructions (Signed)
  Testing/Procedures: SLEEP STUDY   Your physician has recommended that you have a sleep study. This test records several body functions during sleep, including: brain activity, eye movement, oxygen and carbon dioxide blood levels, heart rate and rhythm, breathing rate and rhythm, the flow of air through your mouth and nose, snoring, body muscle movements, and chest and belly movement.   Follow-Up: At [facility], you and your health needs are our priority.  As part of our continuing mission to provide you with exceptional heart care, our providers are all part of one team.  This team includes your primary Cardiologist (physician) and Advanced Practice Providers or APPs (Physician Assistants and Nurse Practitioners) who all work together to provide you with the care you need, when you need it.  Your next appointment:   3 month(s)  Provider:   Newman JINNY Lawrence, MD

## 2023-09-12 ENCOUNTER — Encounter

## 2023-09-14 ENCOUNTER — Encounter

## 2023-09-18 NOTE — Progress Notes (Unsigned)
  Electrophysiology Office Note:   Date:  09/19/2023  ID:  ALAIRA LEVEL, DOB Aug 20, 1933, MRN [id number]  Primary Cardiologist: Newman JINNY Lawrence, MD Primary Heart Failure: None Electrophysiologist: None      History of Present Illness:   Amy Lin is a 89 y.o. female with h/o hypertension, hyperlipidemia, coronary artery disease, aortic stenosis seen today for  for Electrophysiology evaluation of dizziness at the request of Manish Patwardhan.    Today, denies symptoms of palpitations, chest pain, dyspnea, orthopnea, PND, lower extremity edema, claudication, dizziness, presyncope, syncope, bleeding, or neurologic sequela. The patient is tolerating medications without difficulties.  She was in cardiac rehab.  She had an episode of lightheadedness and was sent to the emergency room.  During her episode of lightheadedness, her heart rate was apparently in the 40s.  When she went to the emergency room, her heart rate was well-controlled.  She is continued to have heart rates in the 40s at times.  She wore a cardiac monitor that showed mainly heart rates in the 50s and 60s with an average heart rate of 61.  She usually feels well and can exercise and exert herself without issue.  Review of systems complete and found to be negative unless listed in HPI.   EP Information / Studies Reviewed:    EKG is not ordered today. EKG from 08/15/2023 reviewed which showed sinus rhythm        Risk Assessment/Calculations:           Physical Exam:   VS:  BP 110/68 (BP Location: Right Arm, Patient Position: Sitting, Cuff Size: Normal)   Pulse 74   Ht 5' 4 (1.626 m)   Wt 141 lb 12.8 oz (64.3 kg)   SpO2 98%   BMI 24.34 kg/m    Wt Readings from Last 3 Encounters:  09/19/23 141 lb 12.8 oz (64.3 kg)  09/08/23 141 lb (64 kg)  08/25/23 143 lb 3.2 oz (65 kg)     GEN: Well nourished, well developed in no acute distress NECK: No JVD; No carotid bruits CARDIAC: Regular rate and rhythm, no murmurs, rubs,  gallops RESPIRATORY:  Clear to auscultation without rales, wheezing or rhonchi  ABDOMEN: Soft, non-tender, non-distended EXTREMITIES:  No edema; No deformity   ASSESSMENT AND PLAN:    1.  Sinus pauses: Has nocturnal sinus pauses.  Thought related to obstructive sleep apnea.  Sleep study pending.  She has not had any syncope.  She is able to do her daily activities.  She exercises.  I told her to get a [place] mobile app.  She does have at times heart rates in the 40s.  If her heart rate in the 40s is related to any kind of heart block, pacemaker implant would be indicated.  For now, we Tilmon Wisehart hold off.  2.  Atrial tachycardia: Minimal episodes.  Continue monitoring.  3.  Coronary artery disease: PCI to the ramus.  Plan per primary cardiology  4.  Hypertension: Well-controlled  5.  Mild aortic stenosis: Stable on most recent echo.  Plan per primary cardiology  Follow up with Dr. Inocencio as needed   Signed, Antjuan Rothe Gladis Inocencio, MD

## 2023-09-19 ENCOUNTER — Ambulatory Visit: Attending: Cardiology | Admitting: Cardiology

## 2023-09-19 ENCOUNTER — Encounter: Payer: Self-pay | Admitting: Cardiology

## 2023-09-19 ENCOUNTER — Ambulatory Visit: Admitting: Cardiology

## 2023-09-19 ENCOUNTER — Encounter

## 2023-09-19 VITALS — BP 110/68 | HR 74 | Ht 64.0 in | Wt 141.8 lb

## 2023-09-19 DIAGNOSIS — I10 Essential (primary) hypertension: Secondary | ICD-10-CM | POA: Diagnosis not present

## 2023-09-19 DIAGNOSIS — I4719 Other supraventricular tachycardia: Secondary | ICD-10-CM

## 2023-09-19 DIAGNOSIS — I455 Other specified heart block: Secondary | ICD-10-CM

## 2023-09-19 DIAGNOSIS — I2510 Atherosclerotic heart disease of native coronary artery without angina pectoris: Secondary | ICD-10-CM

## 2023-09-21 ENCOUNTER — Encounter

## 2023-10-09 ENCOUNTER — Encounter: Payer: Self-pay | Admitting: Cardiology

## 2023-10-11 ENCOUNTER — Telehealth: Payer: Self-pay

## 2023-10-11 NOTE — Telephone Encounter (Signed)
**Note De-Identified Lousie Calico Obfuscation** I started a Home Sleep Study Type 3 PA through the [employer] provider portal and it is currently pending. Outpatient Authorization [id number]

## 2023-10-12 NOTE — Telephone Encounter (Signed)
**Note De-Identified Gavynn Duvall Obfuscation** Letter received from the [facility] provider portal stating that they have approved this Split Night Sleep Study from 10/11/2023 - 01/09/2024. Outpatient Authorization [id number]  I have transferred the order to the sleep lab.  I called the pt and advised her that [facility] has approved the Home Sleep Study Type 3 PA from 10/11/2023 - 01/09/2024 and I offered her the [facility]'s phone number and she stated that she was driving and requested that I call her home phone and leave the phone number on her VM which I have done. I did leave the office number in the message incase she has any questions for us .

## 2023-10-18 DIAGNOSIS — L728 Other follicular cysts of the skin and subcutaneous tissue: Secondary | ICD-10-CM | POA: Diagnosis not present

## 2023-10-19 ENCOUNTER — Encounter: Payer: Self-pay | Admitting: Pharmacist

## 2023-10-19 NOTE — Progress Notes (Unsigned)
 Pharmacy Quality Measure Review  This patient is appearing on a report for being at risk of failing the adherence measure for cholesterol (statin) medications this calendar year.   Medication: rosuvastatin  20 mg Last fill date: 07/13/23 for 90 day supply  {adherenceinterventions:31257}

## 2023-10-25 ENCOUNTER — Other Ambulatory Visit: Payer: Self-pay | Admitting: Cardiology

## 2023-10-25 MED ORDER — LISINOPRIL 5 MG PO TABS: 5.0000 mg | ORAL_TABLET | Freq: Every day | ORAL | 3 refills | Status: DC

## 2023-10-25 MED ORDER — LISINOPRIL 5 MG PO TABS
5.0000 mg | ORAL_TABLET | Freq: Every day | ORAL | 3 refills | Status: AC
  Filled 2023-10-25: qty 90, 90d supply, fill #0

## 2023-10-25 NOTE — Telephone Encounter (Signed)
**Note De-identified  [place] Obfuscation** Please advise 

## 2023-10-25 NOTE — Telephone Encounter (Signed)
 This medication was prescribed by one of Dr. Elmira providers at his old office. Would Dr. Elmira refill this medication for the pt? Please address

## 2023-10-25 NOTE — Telephone Encounter (Signed)
MEDICATION REFILLED

## 2023-10-25 NOTE — Telephone Encounter (Signed)
*  STAT* If patient is at the pharmacy, call can be transferred to refill team.   1. Which medications need to be refilled? (please list name of each medication and dose if known)   lisinopril  (ZESTRIL ) 5 MG tablet    2. Which pharmacy/location (including street and city if local pharmacy) is medication to be sent to?  [facility] - [place], [place] - [address]      3. Do they need a 30 day or 90 day supply? 90 day   Pt is out of medication and leaving to go out of town in the AM

## 2023-10-26 ENCOUNTER — Other Ambulatory Visit (HOSPITAL_COMMUNITY): Payer: Self-pay

## 2023-11-08 ENCOUNTER — Ambulatory Visit (HOSPITAL_BASED_OUTPATIENT_CLINIC_OR_DEPARTMENT_OTHER): Attending: Cardiology | Admitting: Cardiology

## 2023-11-08 DIAGNOSIS — I455 Other specified heart block: Secondary | ICD-10-CM | POA: Insufficient documentation

## 2023-11-20 ENCOUNTER — Other Ambulatory Visit: Payer: Self-pay | Admitting: Cardiology

## 2023-11-20 DIAGNOSIS — H524 Presbyopia: Secondary | ICD-10-CM | POA: Diagnosis not present

## 2023-11-20 DIAGNOSIS — H353131 Nonexudative age-related macular degeneration, bilateral, early dry stage: Secondary | ICD-10-CM | POA: Diagnosis not present

## 2023-11-20 DIAGNOSIS — Z961 Presence of intraocular lens: Secondary | ICD-10-CM | POA: Diagnosis not present

## 2023-11-27 ENCOUNTER — Ambulatory Visit
Admission: RE | Admit: 2023-11-27 | Discharge: 2023-11-27 | Disposition: A | Source: Ambulatory Visit | Attending: Nurse Practitioner

## 2023-11-27 DIAGNOSIS — R921 Mammographic calcification found on diagnostic imaging of breast: Secondary | ICD-10-CM

## 2023-11-28 ENCOUNTER — Other Ambulatory Visit: Payer: Self-pay

## 2023-11-28 NOTE — Procedures (Signed)
   [facility] [address] [place], [place] [address] Tel: [phone]   Fax: [phone]  Home Sleep Test Interpretation  Patient Name: Amy Lin, Amy Lin Date: 11/09/2023  Date of Birth: [birth year] Study Type: HST  Age: 89 year MRN #: [id number]  Sex: Female Interpreting Physician: SHLOMO CORNING, [id number]  Height: 5' 4 Referring Physician: NEWMAN LAWRENCE [phone])  Weight: 141.0 lbs Recording Tech: Will Poet RRT RPSGT RST  BMI: 24.4 Scoring Tech: Will Poet RRT RPSGT RST  ESS: 5 Neck Size: 14  %%startinterp%% %%startinterp%% Indications for Polysomnography The patient is a 89 year-old Female who is 5' 4 and weighs 141.0 lbs. Her BMI equals 24.4.  A home sleep apnea test was performed to evaluate for obstructive sleep apnea.  Medication  No Data.   Polysomnogram Data A home sleep test recorded the standard physiologic parameters including EKG, nasal and oral airflow.  Respiratory parameters of chest and abdominal movements were recorded with Respiratory Inductance Plethysmography belts.  Oxygen saturation was recorded by pulse oximetry.   Study Architecture The total recording time of the polysomnogram was 441.6 minutes. The total monitoring time was 442.0 minutes.  Time spent in Supine position was 412.0 minutes.   Respiratory Events The study revealed a presence of 30 obstructive, 4 centrals, and 0 mixed apneas resulting in an Apnea index of 4.6 events per hour. There were 10 hypopneas (>=3% desaturation and/or arousal) resulting in an Apnea\Hypopnea Index (AHI >=3% desaturation and/or arousal) of 6.0 events per hour. There were 2 hypopneas (>=4% desaturation) resulting in an Apnea\Hypopnea Index (AHI >=4% desaturation) of 4.9 events per hour. There were 0 Respiratory Effort Related Arousals resulting in a RERA index of 0 events per hour. The Respiratory Disturbance Index is 6.0 events per hour.  The snore index was 0 events per  hour.  Mean oxygen saturation was 96.1%. The lowest oxygen saturation during monitoring time was 93.0%.  Time spent <=88% oxygen saturation was 0 minutes .  Cardiac Summary The average pulse rate was 60.6 bpm.  The minimum pulse rate was 54.0 bpm while the maximum pulse rate was 79.0 bpm.     Diagnosis:  Normal sleep study  Recommendations: 1. Normal study with no significant sleep disordered breathing. 2. Healthy sleep recommendations include: adequate nightly sleep (normal 7-9 hrs/night), avoidance of caffeine after  noon and alcohol near bedtime, and maintaining a sleep environment that is cool, dark and quiet. 3. Weight loss for overweight patients is recommended.  4. Snoring recommendations include: weight loss where appropriate, side sleeping, and avoidance of alcohol before  bed. 5. Operation of motor vehicle or dangerous equipment must be avoided when feeling drowsy, excessively sleepy, or  mentally fatigued.  6. An ENT consultation which may be useful for specific causes of and possible treatment of bothersome snoring .  7. Weight loss may be of benefit in reducing the severity of snoring    This study was personally reviewed and electronically signed by: Dr. Corning Shlomo Accredited Board Certified in Sleep Medicine Date/Time: 11/28/2023 2:37 PM

## 2023-11-28 NOTE — Progress Notes (Signed)
 Patient has not started therapy since Prolia  was first sent on 12/19/23. Dis-enrolling patient.

## 2023-11-28 NOTE — Procedures (Signed)
%%  startinterp%% Indications for Polysomnography The patient is a 89 year-old Female who is 5' 4 and weighs 141.0 lbs. Her BMI equals 24.4.  A home sleep apnea test was performed to evaluate for obstructive sleep apnea.  MedicationNo Data. Polysomnogram Data A home sleep test recorded the standard physiologic parameters including EKG, nasal and oral airflow.  Respiratory parameters of chest and abdominal movements were recorded with Respiratory Inductance Plethysmography belts.  Oxygen saturation was  recorded by pulse oximetry.  Study Architecture The total recording time of the polysomnogram was 441.6 minutes. The total monitoring time was 442.0 minutes.  Time spent in Supine position was 412.0 minutes.  Respiratory Events The study revealed a presence of 30 obstructive, 4 centrals, and 0 mixed apneas resulting in an Apnea index of 4.6 events per hour. There were 10 hypopneas (GreaterEqual to3% desaturation and/or arousal) resulting in an Apnea\Hypopnea Index (AHI  GreaterEqual to3% desaturation and/or arousal) of 6.0 events per hour. There were 2 hypopneas (GreaterEqual to4% desaturation) resulting in an Apnea\Hypopnea Index (AHI GreaterEqual to4% desaturation) of 4.9 events per hour. There were 0 Respiratory  Effort Related Arousals resulting in a RERA index of 0 events per hour. The Respiratory Disturbance Index is 6.0 events per hour.  The snore index was 0 events per hour.  Mean oxygen saturation was 96.1%. The lowest oxygen saturation during monitoring time was 93.0%.  Time spent LessEqual to88% oxygen saturation was 0 minutes .  Cardiac Summary The average pulse rate was 60.6 bpm.  The minimum pulse rate was 54.0 bpm while the maximum pulse rate was 79.0 bpm.   Diagnosis: Normal sleep study  Recommendations: 1. Normal study with no significant sleep disordered breathing. 2. Healthy sleep recommendations include: adequate nightly sleep (normal 7-9 hrs/night), avoidance of  caffeine after noon and alcohol near bedtime, and maintaining a sleep environment that is cool, dark and quiet. 3. Weight loss for overweight patients is recommended. 4. Snoring recommendations include: weight loss where appropriate, side sleeping, and avoidance of alcohol before bed. 5. Operation of motor vehicle or dangerous equipment must be avoided when feeling drowsy, excessively sleepy, or mentally fatigued. 6. An ENT consultation which may be useful for specific causes of and possible treatment of bothersome snoring . 7. Weight loss may be of benefit in reducing the severity of snoring    This study was personally reviewed and electronically signed by: Dr. Wilbert Bihari Accredited Board Certified in Sleep Medicine Date/Time: 11/28/2023 2:37 PM

## 2023-11-29 ENCOUNTER — Ambulatory Visit: Payer: Self-pay | Admitting: Nurse Practitioner

## 2023-11-29 ENCOUNTER — Telehealth: Payer: Self-pay | Admitting: *Deleted

## 2023-11-29 NOTE — Telephone Encounter (Signed)
-----   Message from Wilbert Bihari sent at 11/28/2023  2:38 PM EDT ----- Please let patient know that sleep study showed no significant sleep apnea.

## 2023-11-29 NOTE — Telephone Encounter (Signed)
 The patient has been notified of the result via her mychart and informed patient to call back. Joshua Dalton Seip, CMA.

## 2023-12-04 ENCOUNTER — Ambulatory Visit: Admitting: Cardiology

## 2023-12-04 ENCOUNTER — Ambulatory Visit: Admitting: Nurse Practitioner

## 2023-12-04 VITALS — BP 110/70 | HR 95 | Temp 98.1°F | Ht 64.0 in | Wt 143.0 lb

## 2023-12-04 DIAGNOSIS — Z23 Encounter for immunization: Secondary | ICD-10-CM

## 2023-12-04 DIAGNOSIS — H9201 Otalgia, right ear: Secondary | ICD-10-CM | POA: Diagnosis not present

## 2023-12-04 DIAGNOSIS — I10 Essential (primary) hypertension: Secondary | ICD-10-CM

## 2023-12-04 DIAGNOSIS — R202 Paresthesia of skin: Secondary | ICD-10-CM

## 2023-12-04 LAB — COMPREHENSIVE METABOLIC PANEL WITH GFR
ALT: 9 U/L (ref 0–35)
AST: 17 U/L (ref 0–37)
Albumin: 3.7 g/dL (ref 3.5–5.2)
Alkaline Phosphatase: 56 U/L (ref 39–117)
BUN: 18 mg/dL (ref 6–23)
CO2: 28 meq/L (ref 19–32)
Calcium: 9.2 mg/dL (ref 8.4–10.5)
Chloride: 104 meq/L (ref 96–112)
Creatinine, Ser: 0.94 mg/dL (ref 0.40–1.20)
GFR: 54.43 mL/min — ABNORMAL LOW (ref >60.00)
Glucose, Bld: 86 mg/dL (ref 70–99)
Potassium: 4.2 meq/L (ref 3.5–5.1)
Sodium: 139 meq/L (ref 135–145)
Total Bilirubin: 0.5 mg/dL (ref 0.2–1.2)
Total Protein: 7.2 g/dL (ref 6.0–8.3)

## 2023-12-04 LAB — CBC
HCT: 34.4 % — ABNORMAL LOW (ref 36.0–46.0)
Hemoglobin: 11.5 g/dL — ABNORMAL LOW (ref 12.0–15.0)
MCHC: 33.4 g/dL (ref 30.0–36.0)
MCV: 94 fl (ref 78.0–100.0)
Platelets: 140 K/uL — ABNORMAL LOW (ref 150.0–400.0)
RBC: 3.66 Mil/uL — ABNORMAL LOW (ref 3.87–5.11)
RDW: 13.8 % (ref 11.5–15.5)
WBC: 5.3 K/uL (ref 4.0–10.5)

## 2023-12-04 LAB — VITAMIN B12: Vitamin B-12: 165 pg/mL — ABNORMAL LOW (ref 211–911)

## 2023-12-04 MED ORDER — HYDROCORTISONE 2.5 % EX LOTN: 1.0000 | TOPICAL_LOTION | Freq: Two times a day (BID) | CUTANEOUS | 1 refills | Status: AC | PRN

## 2023-12-04 NOTE — Progress Notes (Signed)
 Established Patient Office Visit  Subjective   Patient ID: Amy Lin, female    DOB: 08-30-1933  Age: 89 y.o. MRN: [id number]  Chief Complaint  Patient presents with   Follow-up    BP check     HPI  Discussed the use of AI scribe software for clinical note transcription with the patient, who gave verbal consent to proceed.  History of Present Illness Amy Lin is an 89 year old female with hypertension who presents for a routine six-month follow-up visit.  She monitors her blood pressure at home, noting stability, but occasionally experiences dizziness or lightheadedness, which she attributes to hypotension. In response, she skips her lisinopril  dose and increases water intake. She takes lisinopril  daily and uses Lasix  as needed, though she has not needed it recently due to lack of peripheral edema.  She describes a sensation of having 'pads stuck' to the bottoms of her feet for over a year, suspecting neuropathy. Recently, she has experienced nocturnal foot pain, especially when lying on her right side. Her toes sometimes turn purple, though she reports they are not painful. She has a history of spider veins, which she finds unsightly.  She has a persistent right earache, described as pressure rather than pain, present for a long time. Her right ear becomes tender and painful, described as a 'raw feeling' that is tender in the morning but improves throughout the day. She uses Flonase  occasionally but experiences epistaxis if used for more than a few days.  She underwent a sleep study which showed no sleep apnea, despite previous concerns due to heart monitor discrepancies at night. She has a history of osteoporosis and received one Prolia  injection in the past but has not continued due to scheduling issues. She is uncertain about continuing this treatment.  She is planning to travel to [facility], [place] , to visit her granddaughter, who is a physician. She recently adopted two  cats, which have been keeping her busy.     Review of Systems  Constitutional:  Negative for chills and fever.  Respiratory:  Negative for shortness of breath.   Cardiovascular:  Negative for chest pain.  Musculoskeletal:  Positive for joint pain.  Neurological:  Positive for dizziness and tingling. Negative for headaches.      Objective:     BP 110/70   Pulse 95   Temp 98.1 F (36.7 C) (Oral)   Ht 5' 4 (1.626 m)   Wt 143 lb (64.9 kg)   SpO2 94%   BMI 24.55 kg/m  BP Readings from Last 3 Encounters:  12/04/23 110/70  09/19/23 110/68  09/08/23 126/64   Wt Readings from Last 3 Encounters:  12/04/23 143 lb (64.9 kg)  11/08/23 141 lb (64 kg)  09/19/23 141 lb 12.8 oz (64.3 kg)   SpO2 Readings from Last 3 Encounters:  12/04/23 94%  09/19/23 98%  09/08/23 98%      Physical Exam Vitals and nursing note reviewed.  Constitutional:      Appearance: Normal appearance.  HENT:     Head:      Right Ear: Tympanic membrane and ear canal normal.  Cardiovascular:     Rate and Rhythm: Normal rate and regular rhythm.     Heart sounds: Normal heart sounds.  Pulmonary:     Effort: Pulmonary effort is normal.     Breath sounds: Normal breath sounds.  Feet:     Comments: Bilateral 2+ DP and PT pulses Neurological:     Mental Status:  She is alert.      No results found for any visits on 12/04/23.    The ASCVD Risk score (Arnett DK, et al., 2019) failed to calculate for the following reasons:   The 2019 ASCVD risk score is only valid for ages 61 to 101   Risk score cannot be calculated because patient has a medical history suggesting prior/existing ASCVD    Assessment & Plan:   Problem List Items Addressed This Visit       Cardiovascular and Mediastinum   Essential hypertension   Relevant Orders   CBC   Comprehensive metabolic panel with GFR     Other   Paresthesia - Primary   Relevant Orders   CBC   Comprehensive metabolic panel with GFR   Vitamin B12    Ambulatory referral to Neurology   Right ear pain   Relevant Medications   hydrocortisone  2.5 % lotion   Other Visit Diagnoses       Need for influenza vaccination       Relevant Orders   Flu vaccine HIGH DOSE PF(Fluzone Trivalent) (Completed)     Assessment and Plan Assessment & Plan Hypertension Blood pressure well-controlled with occasional dizziness from low readings. Self-adjusts lisinopril  based on readings. - Continue lisinopril  daily, adjust dose based on readings and symptoms. - Encouraged home blood pressure monitoring.  Suspected Peripheral Neuropathy Altered sensation in feet suggests neuropathy. Nerve conduction study needed for confirmation. - Ordered nerve conduction study to confirm neuropathy. - Will discuss treatment options if confirmed.  Raynaud's Phenomenon, suspected Intermittent purplish discoloration of toes suggests Raynaud's. No circulatory compromise observed. - Monitor symptoms, consider calcium  channel blocker if bothersome.  Chronic Ear Pain and Tenderness, Right External Ear Intermittent pain and tenderness with red nodule, possibly from sleeping position pressure. - Apply topical cream to affected area twice daily for one week. - Consider dermatology referral if no improvement.  Eustachian Tube Dysfunction, Right Ear, suspected Intermittent ear fullness and pressure, possibly due to Eustachian tube dysfunction. Flonase  use limited by epistaxis. - Refilled Flonase  prescription. - Consider nasal saline spray to prevent epistaxis.  Osteoporosis Previous Prolia  injection not followed up. Concerns about fractures and treatment efficacy. Discussed fracture risks and impact on life expectancy. Hesitant about treatment continuation. - Ordered bone density scan to assess current status. - Discussed potential treatment options, including Jubonti, after results.  Chronic Venous Insufficiency with Spider Veins, Lower Extremities Spider veins present,  more pronounced in one leg. No significant swelling or pain. - Monitor symptoms, consider vascular referral if swelling or pain develops.  General Health Maintenance Flu shot not yet received. Discussed importance of vaccination, especially with upcoming travel. - Administered flu shot today.   Return in about 6 months (around 06/02/2024) for CPE and Labs.    Adina Crandall, NP

## 2023-12-04 NOTE — Patient Instructions (Signed)
 Nice to see you today We updated your flu vaccine today Follow up with me in 6 months, sooner if you need me  Call and scheduled your bone density scan at   [facility] [address] ( on hospital grounds) [place], [place]  [phone]

## 2023-12-06 ENCOUNTER — Encounter: Payer: Self-pay | Admitting: Cardiology

## 2023-12-06 ENCOUNTER — Ambulatory Visit: Payer: Self-pay | Admitting: Nurse Practitioner

## 2023-12-06 ENCOUNTER — Ambulatory Visit: Attending: Cardiology | Admitting: Cardiology

## 2023-12-06 VITALS — BP 144/76 | HR 62 | Ht 64.0 in | Wt 143.0 lb

## 2023-12-06 DIAGNOSIS — I350 Nonrheumatic aortic (valve) stenosis: Secondary | ICD-10-CM | POA: Diagnosis not present

## 2023-12-06 DIAGNOSIS — R7989 Other specified abnormal findings of blood chemistry: Secondary | ICD-10-CM

## 2023-12-06 DIAGNOSIS — E782 Mixed hyperlipidemia: Secondary | ICD-10-CM | POA: Diagnosis not present

## 2023-12-06 DIAGNOSIS — I2510 Atherosclerotic heart disease of native coronary artery without angina pectoris: Secondary | ICD-10-CM

## 2023-12-06 DIAGNOSIS — E538 Deficiency of other specified B group vitamins: Secondary | ICD-10-CM

## 2023-12-06 DIAGNOSIS — I10 Essential (primary) hypertension: Secondary | ICD-10-CM | POA: Diagnosis not present

## 2023-12-06 NOTE — Progress Notes (Signed)
 Cardiology Office Note:  .   Date:  12/06/2023  ID:  Amy Lin, DOB [birth year], MRN [id number] PCP: Amy Lynwood HERO, NP  [facility] Providers Cardiologist:  Newman Lawrence, MD PCP: Amy Lynwood HERO, NP  Chief Complaint  Patient presents with   Coronary Artery Disease      History of Present Illness: .    Amy Lin is a 89 y.o. female with hypertension, hyperlipidemia, CAD, aortic stenosis  Patient is doing well and denies any complaints of chest pain or shortness of breath.  Blood pressure is generally very well-controlled, elevated today.  She has not had any known sinus pauses, does use Kardia mobile from time to time.  Vitals:   12/06/23 1050  BP: (!) 142/66  Pulse: (!) 47  SpO2: 97%      ROS:  Review of Systems  Cardiovascular:  Negative for chest pain, dyspnea on exertion, leg swelling, palpitations and syncope.     Studies Reviewed: SABRA       EKG 12/06/2023: Normal sinus rhythm Normal ECG When compared with ECG of 15-Aug-2023 12:07, No significant change was found    Zio patch monitor 7 days 08/20/2023 - 08/27/2023: Dominant rhythm: Sinus. HR 40-95 bpm. Avg HR 61 bpm, in sinus rhythm. 17 Pauses occurred, the longest lasting 3.5 secs (17 bpm), all during sleep hours without any reported symptoms.. 34 episodes of atrial tachycardia, fastest at 188 bpm for 7 beats, longest for 16 beats at 122 bpm. <1% isolated SVE, couplet/triplets. 0 episodes of VT. <1% isolated VE, couplet/triplets. No atrial fibrillation/atrial flutter/VT/high grade AV block, sinus pause >3sec noted. 4 patient triggered events, correlated with sinus rhythm, SVE.      Coronary angiography and intervention 04/21/2023: LM: Normal LAD: No significant disease Lcx: Prox mild 30% disease (Unchanged since 2022) Ramus: Patent prox stent, no significant stenosis, following a 95% stenosis RCA: Large dominant vessel, patent stents with no significant disease           Rt PDA 30%  disease   LVEDP 13 mmHg   Successful percutaneous coronary intervention ramus        OCT guided PTCA and stent placement 2.5 X 8 mm Onyx drug-eluting stent        Post dilatation using 2.5 X 6 mm Conway balloon up to 18 atm     Labs 06/2023: Chol 122, TG 137, HDL 52, LDL 42 HbA1C 6.7% Hb 12 Cr 0.9 TSH 5.5  Echocardiogram 03/02/2023: 1. Left ventricular ejection fraction, by estimation, is 60 to 65%. The  left ventricle has normal function. The left ventricle has no regional  wall motion abnormalities. There is moderate concentric left ventricular  hypertrophy. Left ventricular  diastolic parameters are consistent with Grade II diastolic dysfunction  (pseudonormalization). Elevated left atrial pressure.   2. Right ventricular systolic function is normal. The right ventricular  size is mildly enlarged. There is mildly elevated pulmonary artery  systolic pressure. The estimated right ventricular systolic pressure is  40.9 mmHg.   3. Left atrial size was moderately dilated.   4. The mitral valve is grossly normal. Mild mitral valve regurgitation.  No evidence of mitral stenosis. Moderate mitral annular calcification.   5. The aortic valve is severely calcified with restricted excursion of  valve leaflets. On limited assessment, mean transvalvular gradient  ~72mmHg. There is no aortic regurgitation.   6. Aortic dilatation noted. There is borderline dilatation of the  ascending aorta, measuring 37 mm.   7. The inferior vena  cava is normal in size with greater than 50%  respiratory variability, suggesting right atrial pressure of 3 mmHg.     Physical Exam:   Physical Exam Vitals and nursing note reviewed.  Constitutional:      General: She is not in acute distress. Neck:     Vascular: No JVD.  Cardiovascular:     Rate and Rhythm: Normal rate and regular rhythm.     Heart sounds: Normal heart sounds. No murmur heard. Pulmonary:     Effort: Pulmonary effort is normal.      Breath sounds: Normal breath sounds. No wheezing or rales.  Musculoskeletal:     Right lower leg: No edema.     Left lower leg: No edema.      VISIT DIAGNOSES:   ICD-10-CM   1. Coronary artery disease involving native coronary artery of native heart without angina pectoris  I25.10 EKG 12-Lead    2. Essential hypertension  I10 EKG 12-Lead    3. Mixed hyperlipidemia  E78.2     4. Nonrheumatic aortic valve stenosis  I35.0           ASSESSMENT AND PLAN: .    Amy Lin is a 89 y.o. female with hypertension, hyperlipidemia, CAD, aortic stenosis   Sinus pause/paroxysmal atrial tachycardia: Nocturnal asymptomatic sinus pauses.  No OSA noted on sleep study.  Asymptomatic paroxysmal atrial tachycardia episodes avoid beta-blocker therapy for now given nocturnal sinus pauses. Appreciate input from Dr. Inocencio.  CAD: S/p PCI to ostial ramus for unstable angina with resolution of chest pain symptoms (04/2023). Patent proximal ramus, proximal/mid RCA stents with rest mild disease. Recommend aspirin  and Plavix  at least in 03/2024 given overlapping stents.  Following that, would consider stopping aspirin  and continuing Plavix  monotherapy. Continue rosuvastatin  20 mg daily, lipids very well-controlled.  Hypertension: Generally well-controlled, elevated blood pressure today.  She is only on lisinopril  5 mg daily.  Recommend regular blood pressure check at home.  If consistently stays SBP >140 mmHg, and increase lisinopril  as needed.  Aortic stenosis: Severe aortic calcification with mild aortic stenosis on echocardiogram 02/2023. Repeat echocardiogram in 03/2024.   F/u in 03/2024   Signed, Newman JINNY Lawrence, MD

## 2023-12-06 NOTE — Telephone Encounter (Addendum)
 Labs have been faxed to cardio

## 2023-12-06 NOTE — Telephone Encounter (Signed)
 Pt requests lab work be sent to cardiologist Dr. Elmira.  [phone]

## 2023-12-06 NOTE — Patient Instructions (Signed)
 Medication Instructions:  No Changes  Lab Work: Please ask your PCP Stacie, Lynwood, NP) to fax us  your recent labs to [phone]  Testing/Procedures: You already have an Echo scheduled on 03/04/2024  Follow-Up: At [facility], you and your health needs are our priority.  As part of our continuing mission to provide you with exceptional heart care, our providers are all part of one team.  This team includes your primary Cardiologist (physician) and Advanced Practice Providers or APPs (Physician Assistants and Nurse Practitioners) who all work together to provide you with the care you need, when you need it.  Your next appointment:    March 2026  Provider:   Newman JINNY Lawrence, MD

## 2023-12-27 ENCOUNTER — Ambulatory Visit: Payer: PPO

## 2023-12-27 VITALS — Ht 64.0 in | Wt 143.0 lb

## 2023-12-27 DIAGNOSIS — H9190 Unspecified hearing loss, unspecified ear: Secondary | ICD-10-CM | POA: Diagnosis not present

## 2023-12-27 DIAGNOSIS — Z0000 Encounter for general adult medical examination without abnormal findings: Secondary | ICD-10-CM

## 2023-12-27 NOTE — Progress Notes (Signed)
 Chief Complaint  Patient presents with   Medicare Wellness     Subjective:   Amy Lin is a 89 y.o. female who presents for a Medicare Annual Wellness Visit.  Allergies (verified) Patient has no known allergies.   History: Past Medical History:  Diagnosis Date   Allergy    Cataract Removed   Coronary artery disease    GERD (gastroesophageal reflux disease)    Hyperlipidemia    Hypertension    diuretic for edema at first   Hypothyroidism    Myocardial infarction [facility]    Neuromuscular disorder (HCC) Don't know   Osteoarthritis, multiple sites    Osteoporosis    Squamous cell carcinoma of skin 12/08/2022   Right pretibial. WD SCC. Mohs 01/31/23   Past Surgical History:  Procedure Laterality Date   ABDOMINAL HYSTERECTOMY     APPENDECTOMY     BACK SURGERY  1999   Discectomy and Ray cage   BLADDER REPAIR  ~2010   CARDIAC CATHETERIZATION  03/05/2020   CARPAL TUNNEL RELEASE  08/2013   COLONOSCOPY W/ BIOPSIES N/A 2016   CORONARY IMAGING/OCT N/A 04/21/2023   Procedure: CORONARY IMAGING/OCT;  Surgeon: Elmira Newman PARAS, MD;  [facility];  Service: Cardiovascular;  Laterality: N/A;   CORONARY STENT INTERVENTION N/A 03/06/2020   Procedure: CORONARY STENT INTERVENTION;  Surgeon: Ladona Heinz, MD;  Location: [facility];  Service: Cardiovascular;  Laterality: N/A;   CORONARY STENT INTERVENTION N/A 04/21/2023   Procedure: CORONARY STENT INTERVENTION;  Surgeon: Elmira Newman PARAS, MD;  Location: [facility];  Service: Cardiovascular;  Laterality: N/A;   CORONARY THROMBECTOMY N/A 03/05/2020   Procedure: Coronary Thrombectomy;  Surgeon: Ladona Heinz, MD;  Location: [facility] INVASIVE CV LAB;  Service: Cardiovascular;  Laterality: N/A;   CORONARY ULTRASOUND/IVUS N/A 03/06/2020   Procedure: Intravascular Ultrasound/IVUS;  Surgeon: Ladona Heinz, MD;  Location: [facility];  Service: Cardiovascular;  Laterality: N/A;   CORONARY/GRAFT ACUTE MI  REVASCULARIZATION N/A 03/05/2020   Procedure: Coronary/Graft Acute MI Revascularization;  Surgeon: Ladona Heinz, MD;  [facility]: [facility];  Service: Cardiovascular;  Laterality: N/A;   LEFT HEART CATH AND CORONARY ANGIOGRAPHY N/A 03/05/2020   Procedure: LEFT HEART CATH AND CORONARY ANGIOGRAPHY;  Surgeon: Ladona Heinz, MD;  [facility]: [facility];  Service: Cardiovascular;  Laterality: N/A;   LEFT HEART CATH AND CORONARY ANGIOGRAPHY N/A 04/21/2023   Procedure: LEFT HEART AND CORONARY ANGIOGRAPHY;  Surgeon: Elmira Newman PARAS, MD;  Location: [facility];  Service: Cardiovascular;  Laterality: N/A;   MENISCECTOMY Left 03/2013   Dr Kay   REFRACTIVE SURGERY Bilateral 07/2019   SPINE SURGERY     TUBAL LIGATION     UPPER GI ENDOSCOPY N/A 03/2015   Family History  Problem Relation Age of Onset   Heart disease Mother    Stroke Father    Hypertension Father    Heart disease Father    Diabetes Father    Heart disease Sister    Heart disease Sister    Breast cancer Neg Hx    Social History   Occupational History   Occupation: Producer, television/film/video    Comment: [employer]  Tobacco Use   Smoking status: Never   Smokeless tobacco: Never  Vaping Use   Vaping status: Never Used  Substance and Sexual Activity   Alcohol use: No   Drug use: No   Sexual activity: Never   Tobacco Counseling Counseling given: Not Answered  SDOH  Screenings   Food Insecurity: No Food Insecurity (12/27/2023)  Housing: Unknown (12/27/2023)  Transportation Needs: No Transportation Needs (12/27/2023)  Utilities: Not At Risk (12/27/2023)  Alcohol Screen: Low Risk  (12/27/2023)  Depression (PHQ2-9): Low Risk  (12/27/2023)  Financial Resource Strain: Low Risk  (12/27/2023)  Physical Activity: Insufficiently Active (12/27/2023)  Social Connections: Moderately Integrated (12/27/2023)  Stress: No Stress Concern Present (12/27/2023)  Tobacco Use: Low Risk  (12/27/2023)  Health Literacy: Adequate  Health Literacy (12/27/2023)   See flowsheets for full screening details  Depression Screen PHQ 2 & 9 Depression Scale- Over the past 2 weeks, how often have you been bothered by any of the following problems? Little interest or pleasure in doing things: 0 Feeling down, depressed, or hopeless (PHQ Adolescent also includes...irritable): 0 PHQ-2 Total Score: 0 Trouble falling or staying asleep, or sleeping too much: 0 Feeling tired or having little energy: 1 Poor appetite or overeating (PHQ Adolescent also includes...weight loss): 0 Feeling bad about yourself - or that you are a failure or have let yourself or your family down: 0 Trouble concentrating on things, such as reading the newspaper or watching television (PHQ Adolescent also includes...like school work): 0 Moving or speaking so slowly that other people could have noticed. Or the opposite - being so fidgety or restless that you have been moving around a lot more than usual: 0 Thoughts that you would be better off dead, or of hurting yourself in some way: 0 PHQ-9 Total Score: 1 If you checked off any problems, how difficult have these problems made it for you to do your work, take care of things at home, or get along with other people?: Not difficult at all     Goals Addressed             This Visit's Progress    I want to maintain my health, improve strength, energy and take care of myself       COMPLETED: Increase physical activity       When weather permits, I will walk outside 30 min daily. Otherwise, I will walk on my treadmill for 30 min.        Visit info / Clinical Intake: Medicare Wellness Visit Type:: Subsequent Annual Wellness Visit Persons participating in visit:: patient Medicare Wellness Visit Mode:: Telephone If telephone:: video declined Because this visit was a virtual/telehealth visit:: unable to obtan vitals due to lack of equipment If Telephone or Video please confirm:: I discussed the limitations of  evaluation and management by telemedicine Patient Location:: home Provider Location:: home Information given by:: patient Interpreter Needed?: No Pre-visit prep was completed: yes AWV questionnaire completed by patient prior to visit?: yes Date:: 12/24/23 Living arrangements:: (!) lives alone Patient's Overall Health Status Rating: good Typical amount of pain: none Does pain affect daily life?: no Are you currently prescribed opioids?: no  Dietary Habits and Nutritional Risks How many meals a day?: 2 (some days 3 meals) Eats fruit and vegetables daily?: yes Most meals are obtained by: preparing own meals; eating out In the last 2 weeks, have you had any of the following?: none Diabetic:: no  Functional Status Activities of Daily Living (to include ambulation/medication): (Patient-Rptd) Independent Ambulation: (Patient-Rptd) Independent Medication Administration: Independent Home Management: (Patient-Rptd) Independent Manage your own finances?: yes Primary transportation is: driving Concerns about vision?: no *vision screening is required for WTM* Concerns about hearing?: no  Fall Screening Falls in the past year?: (Patient-Rptd) 0 Number of falls in past year: 0 Was there  an injury with Fall?: 0 Fall Risk Category Calculator: 0 Patient Fall Risk Level: Low Fall Risk  Fall Risk Patient at Risk for Falls Due to: No Fall Risks Fall risk Follow up: Education provided; Falls prevention discussed  Home and Transportation Safety: All rugs have non-skid backing?: N/A, no rugs All stairs or steps have railings?: yes Grab bars in the bathtub or shower?: yes Have non-skid surface in bathtub or shower?: yes Good home lighting?: yes Regular seat belt use?: yes Hospital stays in the last year:: no  Cognitive Assessment Difficulty concentrating, remembering, or making decisions? : no Will 6CIT or Mini Cog be Completed: yes What year is it?: 0 points What month is it?: 0  points Give patient an address phrase to remember (5 components): [address] [place]  About what time is it?: 0 points Count backwards from 20 to 1: 0 points Say the months of the year in reverse: 0 points Repeat the address phrase from earlier: 0 points 6 CIT Score: 0 points  Advance Directives (For Healthcare) Does Patient Have a Medical Advance Directive?: No Would patient like information on creating a medical advance directive?: No - Patient declined  Reviewed/Updated  Reviewed/Updated: Reviewed All (Medical, Surgical, Family, Medications, Allergies, Care Teams, Patient Goals)        Objective:    Today's Vitals   12/27/23 1106  Weight: 143 lb (64.9 kg)  Height: 5' 4 (1.626 m)   Body mass index is 24.55 kg/m.  Current Medications (verified) Outpatient Encounter Medications as of 12/27/2023  Medication Sig   aspirin  81 MG EC tablet Take 81 mg by mouth daily before breakfast.   Cholecalciferol (VITAMIN D3) 2000 UNITS TABS Take 2,000 Units by mouth every evening.   clopidogrel  (PLAVIX ) 75 MG tablet Take 1 tablet (75 mg total) by mouth daily with breakfast.   ezetimibe  (ZETIA ) 10 MG tablet TAKE ONE TABLET (10 MG TOTAL) BY MOUTH DAILY.   fluticasone  (FLONASE ) 50 MCG/ACT nasal spray PLACE ONE SPRAY INTO BOTH NOSTRILS DAILY (Patient taking differently: Place 1 spray into both nostrils daily as needed for allergies.)   furosemide  (LASIX ) 20 MG tablet Take 1 tablet (20 mg total) by mouth as needed for fluid.   hydrocortisone  2.5 % lotion Apply 1 Application topically 2 (two) times daily as needed (bug bites/skin irritation.).   levocetirizine (XYZAL) 5 MG tablet Take 5 mg by mouth daily as needed for allergies.   levothyroxine  (SYNTHROID ) 75 MCG tablet TAKE ONE TABLET BY MOUTH ONCE A DAY. TAKE ON AN EMPTY STOMACH WITH A GLASS OF WATER ATLEAST 30-60 MIN BEFORE BREAKFAST   lisinopril  (ZESTRIL ) 5 MG tablet Take 1 tablet (5 mg total) by mouth daily.   Multiple  Vitamins-Minerals (PRESERVISION AREDS 2 PO) Take 1 capsule by mouth in the morning and at bedtime.   Naphazoline-Pheniramine (OPCON-A) 0.027-0.315 % SOLN Place 1-2 drops into both eyes 3 (three) times daily as needed (irritated/red eyes).   nitroGLYCERIN  (NITROSTAT ) 0.4 MG SL tablet Place 1 tablet (0.4 mg total) under the tongue every 5 (five) minutes as needed for chest pain.   pantoprazole  (PROTONIX ) 20 MG tablet TAKE ONE TABLET BY MOUTH TWICE A DAY   potassium chloride  (KLOR-CON  M) 10 MEQ tablet Take 1 tablet (10 mEq total) by mouth daily as needed (with use of lasix ).   rosuvastatin  (CRESTOR ) 20 MG tablet Take 1 tablet (20 mg total) by mouth daily.   tretinoin (RETIN-A) 0.025 % cream SMARTSIG:sparingly Topical Every Night   ondansetron  (ZOFRAN -ODT) 4 MG  disintegrating tablet Take 1 tablet (4 mg total) by mouth every 8 (eight) hours as needed for nausea or vomiting. (Patient not taking: Reported on 12/27/2023)   No facility-administered encounter medications on file as of 12/27/2023.   Hearing/Vision screen Vision Screening - Comments:: UTD w/visits with Dr Waylan Immunizations and Health Maintenance Health Maintenance  Topic Date Due   Zoster Vaccines- Shingrix (1 of 2) 01/31/1953   DTaP/Tdap/Td (2 - Tdap) 02/01/2016   COVID-19 Vaccine (4 - 2025-26 season) 10/02/2023   Medicare Annual Wellness (AWV)  12/26/2023   Pneumococcal Vaccine: 50+ Years  Completed   Influenza Vaccine  Completed   Bone Density Scan  Completed   Meningococcal B Vaccine  Aged Out        Assessment/Plan:  This is a routine wellness examination for Amy Lin.  Patient Care Team: Wendee Lynwood HERO, NP as PCP - General (Pain Medicine) Elmira Newman PARAS, MD as PCP - Cardiology (Cardiology) Fate Morna SAILOR, [facility] (Inactive) as Pharmacist (Pharmacist) Waylan Cain, MD as Consulting Physician (Ophthalmology)  I have personally reviewed and noted the following in the patient's chart:   Medical and social  history Use of alcohol, tobacco or illicit drugs  Current medications and supplements including opioid prescriptions. Functional ability and status Nutritional status Physical activity Advanced directives List of other physicians Hospitalizations, surgeries, and ER visits in previous 12 months Vitals Screenings to include cognitive, depression, and falls Referrals and appointments  No orders of the defined types were placed in this encounter.  In addition, I have reviewed and discussed with patient certain preventive protocols, quality metrics, and best practice recommendations. A written personalized care plan for preventive services as well as general preventive health recommendations were provided to patient.   Amy LITTIE Saris, LPN   [id number]   No follow-ups on file.  After Visit Summary: (MyChart) Due to this being a telephonic visit, the after visit summary with patients personalized plan was offered to patient via MyChart   Nurse Notes: Pt relays she has not heard from Neurology. She relays a referral was placed 12/04/23. She also relays she does not hear well. Audiology referral palced. Pt otherwise doing well.  Pt has no concerns or questions. AWV/CPE made for next year

## 2023-12-27 NOTE — Patient Instructions (Signed)
 Amy Lin,  Thank you for taking the time for your Medicare Wellness Visit. I appreciate your continued commitment to your health goals. Please review the care plan we discussed, and feel free to reach out if I can assist you further.  Please note that Annual Wellness Visits do not include a physical exam. Some assessments may be limited, especially if the visit was conducted virtually. If needed, we may recommend an in-person follow-up with your provider.  Ongoing Care Seeing your primary care provider every 3 to 6 months helps us  monitor your health and provide consistent, personalized care.   Referrals If a referral was made during today's visit and you haven't received any updates within two weeks, please contact the referred provider directly to check on the status.  Recommended Screenings:  Health Maintenance  Topic Date Due   Zoster (Shingles) Vaccine (1 of 2) 01[birth year]31/1953   DTaP/Tdap/Td vaccine (2 - Tdap) 02/01/2016   COVID-19 Vaccine (4 - 2025-26 season) 10/02/2023   Medicare Annual Wellness Visit  12/26/2023   Pneumococcal Vaccine for age over 49  Completed   Flu Shot  Completed   Osteoporosis screening with Bone Density Scan  Completed   Meningitis B Vaccine  Aged Out       12/24/2023    4:18 PM  Advanced Directives  Does Patient Have a Medical Advance Directive? No    Vision: Annual vision screenings are recommended for early detection of glaucoma, cataracts, and diabetic retinopathy. These exams can also reveal signs of chronic conditions such as diabetes and high blood pressure.  Dental: Annual dental screenings help detect early signs of oral cancer, gum disease, and other conditions linked to overall health, including heart disease and diabetes.

## 2024-01-02 ENCOUNTER — Ambulatory Visit
Admission: RE | Admit: 2024-01-02 | Discharge: 2024-01-02 | Disposition: A | Source: Ambulatory Visit | Attending: Nurse Practitioner

## 2024-01-02 ENCOUNTER — Other Ambulatory Visit: Payer: Self-pay | Admitting: Nurse Practitioner

## 2024-01-02 DIAGNOSIS — M810 Age-related osteoporosis without current pathological fracture: Secondary | ICD-10-CM | POA: Insufficient documentation

## 2024-01-02 DIAGNOSIS — Z780 Asymptomatic menopausal state: Secondary | ICD-10-CM | POA: Diagnosis not present

## 2024-01-03 ENCOUNTER — Encounter: Payer: Self-pay | Admitting: Neurology

## 2024-01-03 ENCOUNTER — Other Ambulatory Visit: Payer: Self-pay

## 2024-01-03 DIAGNOSIS — R202 Paresthesia of skin: Secondary | ICD-10-CM

## 2024-01-04 ENCOUNTER — Ambulatory Visit: Payer: Self-pay | Admitting: Nurse Practitioner

## 2024-01-04 DIAGNOSIS — M810 Age-related osteoporosis without current pathological fracture: Secondary | ICD-10-CM

## 2024-01-05 ENCOUNTER — Other Ambulatory Visit (INDEPENDENT_AMBULATORY_CARE_PROVIDER_SITE_OTHER)

## 2024-01-05 DIAGNOSIS — E538 Deficiency of other specified B group vitamins: Secondary | ICD-10-CM | POA: Diagnosis not present

## 2024-01-05 DIAGNOSIS — R7989 Other specified abnormal findings of blood chemistry: Secondary | ICD-10-CM | POA: Diagnosis not present

## 2024-01-05 LAB — IBC + FERRITIN
Ferritin: 83.9 ng/mL (ref 10.0–291.0)
Iron: 75 ug/dL (ref 42–145)
Saturation Ratios: 23 % (ref 20.0–50.0)
TIBC: 326.2 ug/dL (ref 250.0–450.0)
Transferrin: 233 mg/dL (ref 212.0–360.0)

## 2024-01-05 LAB — CBC
HCT: 35.6 % — ABNORMAL LOW (ref 36.0–46.0)
Hemoglobin: 12.1 g/dL (ref 12.0–15.0)
MCHC: 34 g/dL (ref 30.0–36.0)
MCV: 94.1 fl (ref 78.0–100.0)
Platelets: 135 K/uL — ABNORMAL LOW (ref 150.0–400.0)
RBC: 3.78 Mil/uL — ABNORMAL LOW (ref 3.87–5.11)
RDW: 14.1 % (ref 11.5–15.5)
WBC: 4.6 K/uL (ref 4.0–10.5)

## 2024-01-05 LAB — FOLATE: Folate: 21.8 ng/mL (ref >5.9)

## 2024-01-05 LAB — VITAMIN B12: Vitamin B-12: 466 pg/mL (ref 211–911)

## 2024-01-05 NOTE — Telephone Encounter (Signed)
 She needs to supplement calcium  and vitamin D  along with prolia 

## 2024-01-05 NOTE — Telephone Encounter (Signed)
-----   Message from [facility] T sent at 01/05/2024 12:08 PM EST ----- Called and spoke with patient.  Pt complains that she has stopped taking Vitamin D  and Calcium  and wants to know if she needs to just get back on those prescriptions instead of prolia .  ----- Message ----- From: Albino Manuelita BROCKS, CMA Sent: 01/05/2024   7:08 AM EST To: Wendee Gunnels  ----- Message from Manuelita BROCKS Albino, CMA sent at 01/05/2024  7:08 AM EST -----

## 2024-01-05 NOTE — Telephone Encounter (Signed)
 Copied from CRM [id number]. Topic: Clinical - Lab/Test Results >> Jan 04, 2024  4:36 PM Sasha M wrote: Reason for CRM: Pt returning [facility] phone call, please call back. Also pt has lab visit in morning and says that if Janae cannot call her back then to please try to catch her at her lab visit at 10am

## 2024-01-09 ENCOUNTER — Ambulatory Visit: Payer: Self-pay | Admitting: Nurse Practitioner

## 2024-01-17 ENCOUNTER — Encounter: Payer: Self-pay | Admitting: Audiologist

## 2024-01-17 ENCOUNTER — Ambulatory Visit: Attending: Nurse Practitioner | Admitting: Audiologist

## 2024-01-17 DIAGNOSIS — H903 Sensorineural hearing loss, bilateral: Secondary | ICD-10-CM | POA: Diagnosis present

## 2024-01-17 NOTE — Procedures (Signed)
 Outpatient Audiology and [facility] [address] [place], [place]  [address] [phone]  AUDIOLOGICAL  EVALUATION  NAME: Amy Lin     DOB:   1933-07-16      MRN: [id number]                                                                                     DATE: 01/17/2024     REFERENT: Wendee Lynwood HERO, NP STATUS: Outpatient DIAGNOSIS: Sensorineural Hearing Loss Bilateral    History: Amy Lin was seen for an audiological evaluation due to difficulty hearing people clearly. 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 She is aware she is struggling to hear. She feels its in part due to chronic congestion in the right ear.  Amy Lin denies pain, pressure, or tinnitus today. She has had some congestion and pressure in the right ear. She saw Otolaryngology for this in 2024. See results below.  Amy Lin has no significant history of hazardous noise exposure.  Medical history shows previous diagnosis of hearing loss and Otolaryngology visit.   09/23/2022 by Corean Console Nance, Au.D Tympanometry was completed due to reported history. Results were: Right Ear: Type As - Normal middle ear pressure and ear canal volume with reduced compliance or mobility of the eardrum. Left Ear: Type As - Normal middle ear pressure and ear canal volume with reduced compliance or mobility of the eardrum. Right ear results show a sloping mild to moderately-severe sensorineural loss.  Left ear results show normal hearing from 250-500 Hz, with a sloping mild to moderate sensorineural hearing loss from 1000-8000 Hz.  Results are asymmetric, AD>AS.  Amy Lin. [place]hour, PA-C: Asymmetry discussed additional workup with MRI discussed Suggested use of fluticasone  nasal spray and OTC antihistamine Return to clinic 1 year audio first    Evaluation:  Otoscopy showed a clear view of the tympanic membranes, bilaterally Tympanometry results were consistent with flat  response in the right ear and shallow but normal function in left ear Audiometric testing was completed using Conventional Audiometry techniques with insert earphones and supraural headphones. Test results are consistent with progression of hearing loss since previous evaluation. Mild sloping to moderately severe sensorineural hearing loss bilaterally.  Right ear 10-15dB worse 250-3kHz. Speech Recognition Thresholds were obtained at  45dB HL in the right ear and at 35dB HL in the left ear. Word Recognition Testing was completed at  40dB SL and Amy Lin scored 60% in the right ear and 88% in the left ear.    Results:  The test results were reviewed with Amy Lin. Amy Lin needs hearing aids and Otolaryngology. She would like to go to a different practice. Amy Lin has asymmetric sensorineural hearing loss and abnormal middle ear function in the right ear.  Audiogram printed and provided to [facility] [place].    Recommendations: Hearing aids recommended for both ears. Patient given list of local hearing aid providers.  Annual audiometric testing recommended to monitor hearing loss for progression.  Recommend referral to [facility] for hearing aid clearance and consultation due to asymmetric hearing.    39 minutes spent testing and counseling on results.   If you have any questions  please feel free to contact me at [phone].  Lauraine Ka Stalnaker Au.D.  Audiologist   01/17/2024  1:33 PM  Cc: Wendee Lynwood HERO, NP

## 2024-02-02 NOTE — Telephone Encounter (Signed)
 Are we restarting Prolia  for the pt?

## 2024-02-05 MED ORDER — DENOSUMAB 60 MG/ML ~~LOC~~ SOSY: 60.0000 mg | PREFILLED_SYRINGE | Freq: Once | SUBCUTANEOUS | Status: AC

## 2024-02-05 NOTE — Addendum Note (Signed)
 Addended by: ALBINO SHAVER C on: 02/05/2024 08:31 AM   Modules accepted: Orders

## 2024-02-05 NOTE — Telephone Encounter (Signed)
 Referral has been placed for Prolia  to start benefit verification process.

## 2024-02-05 NOTE — Telephone Encounter (Signed)
-----   Message from Manuelita JAYSON Frost, CMA sent at 02/02/2024 12:58 PM EST -----

## 2024-02-05 NOTE — Telephone Encounter (Signed)
 Yes I recommend that we restart prolia 

## 2024-02-06 ENCOUNTER — Other Ambulatory Visit (HOSPITAL_COMMUNITY): Payer: Self-pay

## 2024-02-06 ENCOUNTER — Telehealth: Payer: Self-pay

## 2024-02-06 NOTE — Telephone Encounter (Signed)
 Prolia  VOB initiated via [email or web address]  Next Prolia  inj DUE: RESTART

## 2024-02-07 ENCOUNTER — Other Ambulatory Visit (HOSPITAL_COMMUNITY): Payer: Self-pay

## 2024-02-07 NOTE — Telephone Encounter (Signed)
 Pt ready for scheduling for PROLIA  on or after : 02/07/24  Option# 1: Buy/Bill (Office supplied medication)  Out-of-pocket cost due at time of clinic visit: $352  Number of injection/visits approved: ---  Primary: [employer] Prolia  co-insurance: 20% Admin fee co-insurance: 0%  Secondary: --- Prolia  co-insurance:  Admin fee co-insurance:   Medical Benefit Details: Date Benefits were checked: 02/07/24 Deductible: NO/ Coinsurance: 20%/ Admin Fee: 0%  Prior Auth: N/A PA# Expiration Date:   # of doses approved: ----------------------------------------------------------------------- Option# 2- Med Obtained from pharmacy:  Pharmacy benefit: Copay $682.29 (Paid to pharmacy) Admin Fee: 0% (Pay at clinic)  Prior Auth: N/A PA# Expiration Date:   # of doses approved:   If patient wants fill through the pharmacy benefit please send prescription to: [facility], and include estimated need by date in rx notes. Pharmacy will ship medication directly to the office.  Patient NOT eligible for Prolia  Copay Card. Copay Card can make patient's cost as little as $25. Link to apply: [email or web address]://www.[email or web address].[email or web address]/copay  ** This summary of benefits is an estimation of the patient's out-of-pocket cost. Exact cost may very based on individual plan coverage.

## 2024-02-16 NOTE — Telephone Encounter (Signed)
 See referral

## 2024-02-16 NOTE — Telephone Encounter (Signed)
 Patient returned call to [facility] in regards to prolia  injection. Patient doesn't know if it was the pharmacist Morna or CMA. However there was no documentation as to who called her,but she would like a return call in regards to this.

## 2024-02-19 ENCOUNTER — Other Ambulatory Visit: Payer: Self-pay | Admitting: Nurse Practitioner

## 2024-02-19 ENCOUNTER — Other Ambulatory Visit

## 2024-02-19 DIAGNOSIS — M810 Age-related osteoporosis without current pathological fracture: Secondary | ICD-10-CM

## 2024-02-19 DIAGNOSIS — E039 Hypothyroidism, unspecified: Secondary | ICD-10-CM

## 2024-02-19 LAB — COMPREHENSIVE METABOLIC PANEL WITH GFR
ALT: 11 U/L (ref 3–35)
AST: 17 U/L (ref 5–37)
Albumin: 3.7 g/dL (ref 3.5–5.2)
Alkaline Phosphatase: 58 U/L (ref 39–117)
BUN: 24 mg/dL — ABNORMAL HIGH (ref 6–23)
CO2: 27 meq/L (ref 19–32)
Calcium: 9.3 mg/dL (ref 8.4–10.5)
Chloride: 105 meq/L (ref 96–112)
Creatinine, Ser: 0.94 mg/dL (ref 0.40–1.20)
GFR: 54.35 mL/min — ABNORMAL LOW
Glucose, Bld: 122 mg/dL — ABNORMAL HIGH (ref 70–99)
Potassium: 4.3 meq/L (ref 3.5–5.1)
Sodium: 140 meq/L (ref 135–145)
Total Bilirubin: 0.5 mg/dL (ref 0.2–1.2)
Total Protein: 7.1 g/dL (ref 6.0–8.3)

## 2024-02-20 ENCOUNTER — Ambulatory Visit: Payer: Self-pay | Admitting: Nurse Practitioner

## 2024-02-22 ENCOUNTER — Ambulatory Visit

## 2024-02-22 DIAGNOSIS — M810 Age-related osteoporosis without current pathological fracture: Secondary | ICD-10-CM | POA: Diagnosis not present

## 2024-02-22 MED ORDER — DENOSUMAB 60 MG/ML ~~LOC~~ SOSY: 60.0000 mg | PREFILLED_SYRINGE | Freq: Once | SUBCUTANEOUS | Status: AC

## 2024-02-22 MED ORDER — DENOSUMAB 60 MG/ML ~~LOC~~ SOSY
60.0000 mg | PREFILLED_SYRINGE | Freq: Once | SUBCUTANEOUS | Status: AC
  Administered 2024-02-22: 60 mg via SUBCUTANEOUS

## 2024-02-22 NOTE — Progress Notes (Signed)
 Per orders of [employer] DPN AGNP-C, injection of Prolia  60 mg [place] given by Laray Arenas in right arm. Patient tolerated injection well. Patient will make appointment for 6 month.

## 2024-02-28 ENCOUNTER — Other Ambulatory Visit: Payer: Self-pay

## 2024-03-04 ENCOUNTER — Encounter (HOSPITAL_COMMUNITY): Payer: Self-pay

## 2024-03-04 ENCOUNTER — Ambulatory Visit (HOSPITAL_COMMUNITY)

## 2024-03-08 ENCOUNTER — Encounter: Admitting: Neurology

## 2024-04-02 ENCOUNTER — Ambulatory Visit: Admitting: Cardiology

## 2024-04-11 ENCOUNTER — Ambulatory Visit (HOSPITAL_COMMUNITY)

## 2024-04-19 ENCOUNTER — Encounter: Admitting: Neurology

## 2024-05-02 ENCOUNTER — Encounter: Admitting: Neurology

## 2024-06-03 ENCOUNTER — Encounter: Admitting: Nurse Practitioner

## 2024-12-31 ENCOUNTER — Ambulatory Visit
